# Patient Record
Sex: Female | Born: 1935 | Race: White | Hispanic: No | Smoking: Never smoker
Health system: Southern US, Community
[De-identification: ages and names within clinical notes are randomized; demographics above are authoritative.]

## PROBLEM LIST (undated history)

## (undated) DIAGNOSIS — G20A1 Parkinson's disease without dyskinesia, without mention of fluctuations: Secondary | ICD-10-CM

## (undated) DIAGNOSIS — E785 Hyperlipidemia, unspecified: Secondary | ICD-10-CM

## (undated) DIAGNOSIS — T7840XA Allergy, unspecified, initial encounter: Secondary | ICD-10-CM

## (undated) DIAGNOSIS — H532 Diplopia: Secondary | ICD-10-CM

## (undated) DIAGNOSIS — I10 Essential (primary) hypertension: Secondary | ICD-10-CM

## (undated) DIAGNOSIS — F32A Depression, unspecified: Secondary | ICD-10-CM

## (undated) DIAGNOSIS — F329 Major depressive disorder, single episode, unspecified: Secondary | ICD-10-CM

## (undated) DIAGNOSIS — G459 Transient cerebral ischemic attack, unspecified: Secondary | ICD-10-CM

## (undated) DIAGNOSIS — I5032 Chronic diastolic (congestive) heart failure: Secondary | ICD-10-CM

## (undated) DIAGNOSIS — M1990 Unspecified osteoarthritis, unspecified site: Secondary | ICD-10-CM

## (undated) DIAGNOSIS — J189 Pneumonia, unspecified organism: Secondary | ICD-10-CM

## (undated) DIAGNOSIS — F419 Anxiety disorder, unspecified: Secondary | ICD-10-CM

## (undated) DIAGNOSIS — R296 Repeated falls: Secondary | ICD-10-CM

## (undated) DIAGNOSIS — K219 Gastro-esophageal reflux disease without esophagitis: Secondary | ICD-10-CM

## (undated) DIAGNOSIS — G20 Parkinson's disease: Secondary | ICD-10-CM

## (undated) DIAGNOSIS — K869 Disease of pancreas, unspecified: Secondary | ICD-10-CM

## (undated) DIAGNOSIS — G2581 Restless legs syndrome: Secondary | ICD-10-CM

## (undated) DIAGNOSIS — I2510 Atherosclerotic heart disease of native coronary artery without angina pectoris: Secondary | ICD-10-CM

## (undated) DIAGNOSIS — Z8709 Personal history of other diseases of the respiratory system: Secondary | ICD-10-CM

## (undated) DIAGNOSIS — K859 Acute pancreatitis without necrosis or infection, unspecified: Secondary | ICD-10-CM

## (undated) DIAGNOSIS — E039 Hypothyroidism, unspecified: Secondary | ICD-10-CM

## (undated) DIAGNOSIS — A419 Sepsis, unspecified organism: Secondary | ICD-10-CM

## (undated) HISTORY — DX: Unspecified osteoarthritis, unspecified site: M19.90

## (undated) HISTORY — PX: OTHER SURGICAL HISTORY: SHX169

## (undated) HISTORY — DX: Hypothyroidism, unspecified: E03.9

## (undated) HISTORY — PX: THORACENTESIS: SHX235

## (undated) HISTORY — DX: Gastro-esophageal reflux disease without esophagitis: K21.9

## (undated) HISTORY — DX: Diplopia: H53.2

## (undated) HISTORY — DX: Repeated falls: R29.6

## (undated) HISTORY — DX: Hyperlipidemia, unspecified: E78.5

## (undated) HISTORY — DX: Pneumonia, unspecified organism: J18.9

## (undated) HISTORY — DX: Allergy, unspecified, initial encounter: T78.40XA

## (undated) HISTORY — DX: Atherosclerotic heart disease of native coronary artery without angina pectoris: I25.10

## (undated) HISTORY — DX: Anxiety disorder, unspecified: F41.9

## (undated) HISTORY — DX: Restless legs syndrome: G25.81

## (undated) HISTORY — DX: Disease of pancreas, unspecified: K86.9

## (undated) HISTORY — PX: ESOPHAGOGASTRODUODENOSCOPY: SHX1529

## (undated) HISTORY — PX: BREAST LUMPECTOMY: SHX2

## (undated) HISTORY — DX: Major depressive disorder, single episode, unspecified: F32.9

## (undated) HISTORY — DX: Transient cerebral ischemic attack, unspecified: G45.9

## (undated) HISTORY — DX: Parkinson's disease without dyskinesia, without mention of fluctuations: G20.A1

## (undated) HISTORY — DX: Depression, unspecified: F32.A

## (undated) HISTORY — PX: ABDOMINAL HYSTERECTOMY: SHX81

## (undated) HISTORY — DX: Acute pancreatitis without necrosis or infection, unspecified: K85.90

## (undated) HISTORY — DX: Parkinson's disease: G20

## (undated) HISTORY — DX: Sepsis, unspecified organism: A41.9

## (undated) HISTORY — DX: Essential (primary) hypertension: I10

## (undated) HISTORY — DX: Personal history of other diseases of the respiratory system: Z87.09

---

## 1971-08-23 HISTORY — PX: CHOLECYSTECTOMY: SHX55

## 2002-05-21 LAB — HM MAMMOGRAPHY: HM Mammogram: NORMAL

## 2002-08-22 HISTORY — PX: OTHER SURGICAL HISTORY: SHX169

## 2003-06-23 ENCOUNTER — Other Ambulatory Visit: Payer: Self-pay

## 2003-06-24 ENCOUNTER — Other Ambulatory Visit: Payer: Self-pay

## 2003-08-23 HISTORY — PX: KYPHOPLASTY: SHX5884

## 2003-08-23 HISTORY — PX: OTHER SURGICAL HISTORY: SHX169

## 2003-12-20 ENCOUNTER — Other Ambulatory Visit: Payer: Self-pay

## 2004-05-05 ENCOUNTER — Other Ambulatory Visit: Payer: Self-pay

## 2004-06-09 ENCOUNTER — Emergency Department: Payer: Self-pay | Admitting: Internal Medicine

## 2004-07-05 ENCOUNTER — Inpatient Hospital Stay: Payer: Self-pay

## 2004-08-02 ENCOUNTER — Other Ambulatory Visit: Payer: Self-pay

## 2004-08-02 ENCOUNTER — Inpatient Hospital Stay: Payer: Self-pay | Admitting: Anesthesiology

## 2004-08-15 ENCOUNTER — Other Ambulatory Visit: Payer: Self-pay

## 2004-08-15 ENCOUNTER — Inpatient Hospital Stay: Payer: Self-pay

## 2005-02-09 ENCOUNTER — Ambulatory Visit: Payer: Self-pay | Admitting: Internal Medicine

## 2005-03-10 ENCOUNTER — Ambulatory Visit: Payer: Self-pay | Admitting: Internal Medicine

## 2005-05-11 ENCOUNTER — Ambulatory Visit: Payer: Self-pay | Admitting: Internal Medicine

## 2005-06-23 ENCOUNTER — Ambulatory Visit: Payer: Self-pay | Admitting: Internal Medicine

## 2005-07-22 ENCOUNTER — Ambulatory Visit: Payer: Self-pay | Admitting: Internal Medicine

## 2005-10-26 ENCOUNTER — Ambulatory Visit: Payer: Self-pay | Admitting: Internal Medicine

## 2005-11-10 ENCOUNTER — Ambulatory Visit: Payer: Self-pay | Admitting: Internal Medicine

## 2005-11-10 ENCOUNTER — Encounter: Payer: Self-pay | Admitting: Internal Medicine

## 2005-11-20 ENCOUNTER — Encounter: Payer: Self-pay | Admitting: Internal Medicine

## 2005-12-20 ENCOUNTER — Encounter: Payer: Self-pay | Admitting: Internal Medicine

## 2005-12-28 ENCOUNTER — Ambulatory Visit: Payer: Self-pay | Admitting: Internal Medicine

## 2006-01-02 ENCOUNTER — Ambulatory Visit: Payer: Self-pay | Admitting: Internal Medicine

## 2006-01-20 ENCOUNTER — Encounter: Payer: Self-pay | Admitting: Internal Medicine

## 2006-02-19 ENCOUNTER — Encounter: Payer: Self-pay | Admitting: Internal Medicine

## 2006-03-02 ENCOUNTER — Ambulatory Visit: Payer: Self-pay | Admitting: Internal Medicine

## 2006-03-22 ENCOUNTER — Encounter: Payer: Self-pay | Admitting: Internal Medicine

## 2006-04-22 ENCOUNTER — Encounter: Payer: Self-pay | Admitting: Internal Medicine

## 2006-05-22 ENCOUNTER — Encounter: Payer: Self-pay | Admitting: Internal Medicine

## 2006-05-29 ENCOUNTER — Ambulatory Visit: Payer: Self-pay | Admitting: Internal Medicine

## 2006-06-22 ENCOUNTER — Encounter: Payer: Self-pay | Admitting: Internal Medicine

## 2006-06-28 ENCOUNTER — Other Ambulatory Visit: Payer: Self-pay

## 2006-06-29 ENCOUNTER — Inpatient Hospital Stay: Payer: Self-pay | Admitting: Internal Medicine

## 2006-07-06 ENCOUNTER — Ambulatory Visit: Payer: Self-pay | Admitting: Internal Medicine

## 2006-07-12 ENCOUNTER — Inpatient Hospital Stay: Payer: Self-pay | Admitting: Internal Medicine

## 2006-07-12 ENCOUNTER — Other Ambulatory Visit: Payer: Self-pay

## 2006-07-14 ENCOUNTER — Encounter: Payer: Self-pay | Admitting: Internal Medicine

## 2006-07-26 ENCOUNTER — Ambulatory Visit: Payer: Self-pay | Admitting: Internal Medicine

## 2006-08-31 ENCOUNTER — Encounter: Payer: Self-pay | Admitting: Internal Medicine

## 2006-09-20 ENCOUNTER — Encounter: Payer: Self-pay | Admitting: Dermatology

## 2006-09-22 ENCOUNTER — Encounter: Payer: Self-pay | Admitting: Dermatology

## 2006-10-03 ENCOUNTER — Ambulatory Visit: Payer: Self-pay | Admitting: Internal Medicine

## 2006-10-21 ENCOUNTER — Encounter: Payer: Self-pay | Admitting: Dermatology

## 2006-11-03 ENCOUNTER — Ambulatory Visit: Payer: Self-pay | Admitting: Internal Medicine

## 2006-11-03 LAB — CONVERTED CEMR LAB
ALT: 12 units/L (ref 0–40)
Albumin: 3.6 g/dL (ref 3.5–5.2)
BUN: 16 mg/dL (ref 6–23)
CO2: 34 meq/L — ABNORMAL HIGH (ref 19–32)
Calcium: 9 mg/dL (ref 8.4–10.5)
Chloride: 106 meq/L (ref 96–112)
Cholesterol: 158 mg/dL (ref 0–200)
Creatinine, Ser: 0.9 mg/dL (ref 0.4–1.2)
Direct LDL: 81.2 mg/dL
GFR calc Af Amer: 78 mL/min
GFR calc non Af Amer: 65 mL/min
Glucose, Bld: 158 mg/dL — ABNORMAL HIGH (ref 70–99)
HDL: 39 mg/dL (ref >39.0)
Phosphorus: 3.1 mg/dL (ref 2.3–4.6)
Potassium: 4 meq/L (ref 3.5–5.1)
Sodium: 145 meq/L (ref 135–145)
Total CHOL/HDL Ratio: 4.1
Triglycerides: 259 mg/dL (ref 0–149)
VLDL: 52 mg/dL — ABNORMAL HIGH (ref 0–40)

## 2006-11-22 ENCOUNTER — Encounter: Payer: Self-pay | Admitting: Internal Medicine

## 2006-12-01 ENCOUNTER — Encounter: Payer: Self-pay | Admitting: Internal Medicine

## 2006-12-21 ENCOUNTER — Encounter: Payer: Self-pay | Admitting: Internal Medicine

## 2007-01-29 ENCOUNTER — Ambulatory Visit: Payer: Self-pay | Admitting: Surgery

## 2007-02-15 ENCOUNTER — Encounter: Payer: Self-pay | Admitting: Internal Medicine

## 2007-02-20 ENCOUNTER — Encounter: Payer: Self-pay | Admitting: Internal Medicine

## 2007-03-07 DIAGNOSIS — I10 Essential (primary) hypertension: Secondary | ICD-10-CM

## 2007-03-07 DIAGNOSIS — I5032 Chronic diastolic (congestive) heart failure: Secondary | ICD-10-CM | POA: Insufficient documentation

## 2007-03-07 DIAGNOSIS — F411 Generalized anxiety disorder: Secondary | ICD-10-CM | POA: Insufficient documentation

## 2007-03-07 DIAGNOSIS — E039 Hypothyroidism, unspecified: Secondary | ICD-10-CM | POA: Insufficient documentation

## 2007-03-07 DIAGNOSIS — K219 Gastro-esophageal reflux disease without esophagitis: Secondary | ICD-10-CM | POA: Insufficient documentation

## 2007-03-07 DIAGNOSIS — F3289 Other specified depressive episodes: Secondary | ICD-10-CM | POA: Insufficient documentation

## 2007-03-07 DIAGNOSIS — E119 Type 2 diabetes mellitus without complications: Secondary | ICD-10-CM | POA: Insufficient documentation

## 2007-03-07 DIAGNOSIS — F329 Major depressive disorder, single episode, unspecified: Secondary | ICD-10-CM | POA: Insufficient documentation

## 2007-03-09 ENCOUNTER — Ambulatory Visit: Payer: Self-pay | Admitting: Internal Medicine

## 2007-03-12 LAB — CONVERTED CEMR LAB: Hgb A1c MFr Bld: 6.3 % — ABNORMAL HIGH (ref 4.6–6.1)

## 2007-03-15 ENCOUNTER — Observation Stay: Payer: Self-pay | Admitting: Internal Medicine

## 2007-03-23 ENCOUNTER — Encounter: Payer: Self-pay | Admitting: Internal Medicine

## 2007-04-12 ENCOUNTER — Telehealth (INDEPENDENT_AMBULATORY_CARE_PROVIDER_SITE_OTHER): Payer: Self-pay | Admitting: *Deleted

## 2007-04-23 ENCOUNTER — Encounter: Payer: Self-pay | Admitting: Internal Medicine

## 2007-05-23 ENCOUNTER — Encounter: Payer: Self-pay | Admitting: Internal Medicine

## 2007-05-29 ENCOUNTER — Telehealth (INDEPENDENT_AMBULATORY_CARE_PROVIDER_SITE_OTHER): Payer: Self-pay | Admitting: *Deleted

## 2007-06-08 ENCOUNTER — Telehealth (INDEPENDENT_AMBULATORY_CARE_PROVIDER_SITE_OTHER): Payer: Self-pay | Admitting: *Deleted

## 2007-06-23 ENCOUNTER — Encounter: Payer: Self-pay | Admitting: Internal Medicine

## 2007-07-05 ENCOUNTER — Telehealth: Payer: Self-pay | Admitting: Internal Medicine

## 2007-07-09 ENCOUNTER — Ambulatory Visit: Payer: Self-pay | Admitting: Internal Medicine

## 2007-07-09 DIAGNOSIS — G2581 Restless legs syndrome: Secondary | ICD-10-CM

## 2007-07-10 ENCOUNTER — Telehealth: Payer: Self-pay | Admitting: Internal Medicine

## 2007-07-11 LAB — CONVERTED CEMR LAB
ALT: 9 units/L (ref 0–35)
AST: 15 units/L (ref 0–37)
Albumin: 3.8 g/dL (ref 3.5–5.2)
Alkaline Phosphatase: 111 units/L (ref 39–117)
BUN: 21 mg/dL (ref 6–23)
Basophils Absolute: 0.1 10*3/uL (ref 0.0–0.1)
Basophils Relative: 1 % (ref 0.0–1.0)
Bilirubin, Direct: 0.1 mg/dL (ref 0.0–0.3)
CO2: 31 meq/L (ref 19–32)
Calcium: 9.5 mg/dL (ref 8.4–10.5)
Chloride: 101 meq/L (ref 96–112)
Creatinine, Ser: 0.8 mg/dL (ref 0.4–1.2)
Eosinophils Absolute: 0.1 10*3/uL (ref 0.0–0.6)
Eosinophils Relative: 1.5 % (ref 0.0–5.0)
Free T4: 1.2 ng/dL (ref 0.6–1.6)
GFR calc Af Amer: 90 mL/min
GFR calc non Af Amer: 74 mL/min
Glucose, Bld: 45 mg/dL — CL (ref 70–99)
HCT: 40.3 % (ref 36.0–46.0)
Hemoglobin: 13.8 g/dL (ref 12.0–15.0)
Hgb A1c MFr Bld: 6.6 % — ABNORMAL HIGH (ref 4.6–6.0)
Lymphocytes Relative: 36.7 % (ref 12.0–46.0)
MCHC: 34.2 g/dL (ref 30.0–36.0)
MCV: 89.7 fL (ref 78.0–100.0)
Monocytes Absolute: 0.5 10*3/uL (ref 0.2–0.7)
Monocytes Relative: 6.6 % (ref 3.0–11.0)
Neutro Abs: 4 10*3/uL (ref 1.4–7.7)
Neutrophils Relative %: 54.2 % (ref 43.0–77.0)
Phosphorus: 2.9 mg/dL (ref 2.3–4.6)
Platelets: 118 10*3/uL — ABNORMAL LOW (ref 150–400)
Potassium: 4 meq/L (ref 3.5–5.1)
RBC: 4.49 M/uL (ref 3.87–5.11)
RDW: 12.8 % (ref 11.5–14.6)
Sodium: 143 meq/L (ref 135–145)
TSH: 0.14 microintl units/mL — ABNORMAL LOW (ref 0.35–5.50)
Total Bilirubin: 0.7 mg/dL (ref 0.3–1.2)
Total Protein: 7.5 g/dL (ref 6.0–8.3)
WBC: 7.4 10*3/uL (ref 4.5–10.5)

## 2007-07-23 ENCOUNTER — Encounter: Payer: Self-pay | Admitting: Internal Medicine

## 2007-08-01 ENCOUNTER — Telehealth (INDEPENDENT_AMBULATORY_CARE_PROVIDER_SITE_OTHER): Payer: Self-pay | Admitting: *Deleted

## 2007-08-13 ENCOUNTER — Encounter: Payer: Self-pay | Admitting: Internal Medicine

## 2007-08-21 ENCOUNTER — Telehealth: Payer: Self-pay | Admitting: Family Medicine

## 2007-08-23 ENCOUNTER — Encounter: Payer: Self-pay | Admitting: Internal Medicine

## 2007-09-03 ENCOUNTER — Telehealth (INDEPENDENT_AMBULATORY_CARE_PROVIDER_SITE_OTHER): Payer: Self-pay | Admitting: *Deleted

## 2007-09-10 ENCOUNTER — Ambulatory Visit: Payer: Self-pay | Admitting: Internal Medicine

## 2007-09-23 ENCOUNTER — Encounter: Payer: Self-pay | Admitting: Internal Medicine

## 2007-09-26 ENCOUNTER — Telehealth (INDEPENDENT_AMBULATORY_CARE_PROVIDER_SITE_OTHER): Payer: Self-pay | Admitting: *Deleted

## 2007-10-02 ENCOUNTER — Telehealth (INDEPENDENT_AMBULATORY_CARE_PROVIDER_SITE_OTHER): Payer: Self-pay | Admitting: *Deleted

## 2007-10-15 ENCOUNTER — Telehealth (INDEPENDENT_AMBULATORY_CARE_PROVIDER_SITE_OTHER): Payer: Self-pay | Admitting: *Deleted

## 2007-10-21 ENCOUNTER — Encounter: Payer: Self-pay | Admitting: Internal Medicine

## 2007-11-08 ENCOUNTER — Telehealth (INDEPENDENT_AMBULATORY_CARE_PROVIDER_SITE_OTHER): Payer: Self-pay | Admitting: *Deleted

## 2007-11-12 ENCOUNTER — Ambulatory Visit: Payer: Self-pay | Admitting: Internal Medicine

## 2007-11-21 ENCOUNTER — Encounter: Payer: Self-pay | Admitting: Internal Medicine

## 2007-12-21 ENCOUNTER — Encounter: Payer: Self-pay | Admitting: Internal Medicine

## 2008-01-10 ENCOUNTER — Telehealth (INDEPENDENT_AMBULATORY_CARE_PROVIDER_SITE_OTHER): Payer: Self-pay | Admitting: *Deleted

## 2008-01-21 ENCOUNTER — Encounter: Payer: Self-pay | Admitting: Internal Medicine

## 2008-01-28 ENCOUNTER — Telehealth (INDEPENDENT_AMBULATORY_CARE_PROVIDER_SITE_OTHER): Payer: Self-pay | Admitting: *Deleted

## 2008-02-20 ENCOUNTER — Encounter: Payer: Self-pay | Admitting: Internal Medicine

## 2008-03-04 ENCOUNTER — Encounter: Payer: Self-pay | Admitting: Internal Medicine

## 2008-03-11 ENCOUNTER — Telehealth (INDEPENDENT_AMBULATORY_CARE_PROVIDER_SITE_OTHER): Payer: Self-pay | Admitting: *Deleted

## 2008-03-17 ENCOUNTER — Ambulatory Visit: Payer: Self-pay | Admitting: Internal Medicine

## 2008-03-19 LAB — CONVERTED CEMR LAB
Albumin: 3.5 g/dL (ref 3.5–5.2)
BUN: 14 mg/dL (ref 6–23)
Basophils Absolute: 0 10*3/uL (ref 0.0–0.1)
Basophils Relative: 0 % (ref 0.0–3.0)
Bilirubin Urine: NEGATIVE
CO2: 33 meq/L — ABNORMAL HIGH (ref 19–32)
Calcium: 9.4 mg/dL (ref 8.4–10.5)
Chloride: 104 meq/L (ref 96–112)
Creatinine, Ser: 1 mg/dL (ref 0.4–1.2)
Crystals: NEGATIVE
Eosinophils Absolute: 0.1 10*3/uL (ref 0.0–0.7)
Eosinophils Relative: 2 % (ref 0.0–5.0)
Free T4: 1.1 ng/dL (ref 0.6–1.6)
GFR calc Af Amer: 69 mL/min
GFR calc non Af Amer: 57 mL/min
Glucose, Bld: 115 mg/dL — ABNORMAL HIGH (ref 70–99)
HCT: 38.5 % (ref 36.0–46.0)
Hemoglobin, Urine: NEGATIVE
Hemoglobin: 13 g/dL (ref 12.0–15.0)
Hgb A1c MFr Bld: 6.8 % — ABNORMAL HIGH (ref 4.6–6.0)
Ketones, ur: NEGATIVE mg/dL
Lymphocytes Relative: 31.2 % (ref 12.0–46.0)
MCHC: 33.8 g/dL (ref 30.0–36.0)
MCV: 91.4 fL (ref 78.0–100.0)
Monocytes Absolute: 0.6 10*3/uL (ref 0.1–1.0)
Monocytes Relative: 9.4 % (ref 3.0–12.0)
Mucus, UA: NEGATIVE
Neutro Abs: 3.4 10*3/uL (ref 1.4–7.7)
Neutrophils Relative %: 57.4 % (ref 43.0–77.0)
Nitrite: NEGATIVE
Phosphorus: 2.6 mg/dL (ref 2.3–4.6)
Platelets: 146 10*3/uL — ABNORMAL LOW (ref 150–400)
Potassium: 5 meq/L (ref 3.5–5.1)
RBC / HPF: NONE SEEN
RBC: 4.21 M/uL (ref 3.87–5.11)
RDW: 12.6 % (ref 11.5–14.6)
Sodium: 144 meq/L (ref 135–145)
Specific Gravity, Urine: 1.015 (ref 1.000–1.03)
Squamous Epithelial / HPF: NEGATIVE /lpf
TSH: 0.34 microintl units/mL — ABNORMAL LOW (ref 0.35–5.50)
Total Protein, Urine: NEGATIVE mg/dL
Urine Glucose: NEGATIVE mg/dL
Urobilinogen, UA: 0.2 (ref 0.0–1.0)
WBC: 6 10*3/uL (ref 4.5–10.5)
pH: 5.5 (ref 5.0–8.0)

## 2008-03-20 ENCOUNTER — Telehealth: Payer: Self-pay | Admitting: Internal Medicine

## 2008-03-22 ENCOUNTER — Encounter: Payer: Self-pay | Admitting: Internal Medicine

## 2008-04-22 ENCOUNTER — Encounter: Payer: Self-pay | Admitting: Internal Medicine

## 2008-04-25 ENCOUNTER — Telehealth: Payer: Self-pay | Admitting: Internal Medicine

## 2008-04-30 ENCOUNTER — Encounter: Payer: Self-pay | Admitting: Internal Medicine

## 2008-05-07 ENCOUNTER — Telehealth: Payer: Self-pay | Admitting: Internal Medicine

## 2008-05-08 ENCOUNTER — Encounter: Payer: Self-pay | Admitting: Internal Medicine

## 2008-05-12 ENCOUNTER — Telehealth (INDEPENDENT_AMBULATORY_CARE_PROVIDER_SITE_OTHER): Payer: Self-pay | Admitting: *Deleted

## 2008-05-12 ENCOUNTER — Encounter: Payer: Self-pay | Admitting: Internal Medicine

## 2008-05-22 ENCOUNTER — Encounter: Payer: Self-pay | Admitting: Internal Medicine

## 2008-06-22 ENCOUNTER — Encounter: Payer: Self-pay | Admitting: Internal Medicine

## 2008-06-27 ENCOUNTER — Encounter (INDEPENDENT_AMBULATORY_CARE_PROVIDER_SITE_OTHER): Payer: Self-pay | Admitting: *Deleted

## 2008-07-08 ENCOUNTER — Telehealth: Payer: Self-pay | Admitting: Internal Medicine

## 2008-07-11 ENCOUNTER — Telehealth: Payer: Self-pay | Admitting: Internal Medicine

## 2008-07-11 ENCOUNTER — Telehealth (INDEPENDENT_AMBULATORY_CARE_PROVIDER_SITE_OTHER): Payer: Self-pay | Admitting: *Deleted

## 2008-07-21 ENCOUNTER — Ambulatory Visit: Payer: Self-pay | Admitting: Internal Medicine

## 2008-07-21 DIAGNOSIS — J309 Allergic rhinitis, unspecified: Secondary | ICD-10-CM | POA: Insufficient documentation

## 2008-07-22 ENCOUNTER — Encounter: Payer: Self-pay | Admitting: Internal Medicine

## 2008-07-23 LAB — CONVERTED CEMR LAB
Albumin: 3.5 g/dL (ref 3.5–5.2)
BUN: 18 mg/dL (ref 6–23)
CO2: 33 meq/L — ABNORMAL HIGH (ref 19–32)
Calcium: 9 mg/dL (ref 8.4–10.5)
Chloride: 103 meq/L (ref 96–112)
Creatinine, Ser: 1 mg/dL (ref 0.4–1.2)
GFR calc Af Amer: 69 mL/min
GFR calc non Af Amer: 57 mL/min
Glucose, Bld: 163 mg/dL — ABNORMAL HIGH (ref 70–99)
Hgb A1c MFr Bld: 6.3 % — ABNORMAL HIGH (ref 4.6–6.0)
Phosphorus: 4.1 mg/dL (ref 2.3–4.6)
Potassium: 4.4 meq/L (ref 3.5–5.1)
Sodium: 143 meq/L (ref 135–145)

## 2008-08-22 ENCOUNTER — Encounter: Payer: Self-pay | Admitting: Internal Medicine

## 2008-08-25 ENCOUNTER — Telehealth: Payer: Self-pay | Admitting: Internal Medicine

## 2008-09-15 ENCOUNTER — Telehealth: Payer: Self-pay | Admitting: Internal Medicine

## 2008-09-22 ENCOUNTER — Encounter: Payer: Self-pay | Admitting: Internal Medicine

## 2008-10-07 ENCOUNTER — Telehealth: Payer: Self-pay | Admitting: Internal Medicine

## 2008-10-09 ENCOUNTER — Telehealth: Payer: Self-pay | Admitting: Internal Medicine

## 2008-10-20 ENCOUNTER — Encounter: Payer: Self-pay | Admitting: Internal Medicine

## 2008-10-21 ENCOUNTER — Telehealth: Payer: Self-pay | Admitting: Internal Medicine

## 2008-10-27 ENCOUNTER — Ambulatory Visit: Payer: Self-pay | Admitting: Internal Medicine

## 2008-10-27 DIAGNOSIS — E785 Hyperlipidemia, unspecified: Secondary | ICD-10-CM

## 2008-10-27 DIAGNOSIS — R32 Unspecified urinary incontinence: Secondary | ICD-10-CM

## 2008-10-27 LAB — CONVERTED CEMR LAB
Bilirubin Urine: NEGATIVE
Blood in Urine, dipstick: NEGATIVE
Glucose, Urine, Semiquant: NEGATIVE
Nitrite: NEGATIVE

## 2008-10-30 LAB — CONVERTED CEMR LAB
ALT: 11 units/L (ref 0–35)
AST: 16 units/L (ref 0–37)
Albumin: 3.6 g/dL (ref 3.5–5.2)
Alkaline Phosphatase: 94 units/L (ref 39–117)
BUN: 25 mg/dL — ABNORMAL HIGH (ref 6–23)
Basophils Absolute: 0 10*3/uL (ref 0.0–0.1)
Basophils Relative: 0.3 % (ref 0.0–3.0)
Bilirubin, Direct: 0.1 mg/dL (ref 0.0–0.3)
CO2: 31 meq/L (ref 19–32)
Calcium: 9.8 mg/dL (ref 8.4–10.5)
Chloride: 101 meq/L (ref 96–112)
Cholesterol: 171 mg/dL (ref 0–200)
Creatinine, Ser: 1 mg/dL (ref 0.4–1.2)
Eosinophils Absolute: 0.1 10*3/uL (ref 0.0–0.7)
Eosinophils Relative: 1.9 % (ref 0.0–5.0)
Free T4: 1 ng/dL (ref 0.6–1.6)
GFR calc Af Amer: 69 mL/min
GFR calc non Af Amer: 57 mL/min
Glucose, Bld: 45 mg/dL — CL (ref 70–99)
HCT: 39.7 % (ref 36.0–46.0)
HDL: 41.1 mg/dL (ref >39.0)
Hemoglobin: 13.4 g/dL (ref 12.0–15.0)
Hgb A1c MFr Bld: 6.5 % — ABNORMAL HIGH (ref 4.6–6.0)
LDL Cholesterol: 98 mg/dL (ref 0–99)
Lymphocytes Relative: 28.8 % (ref 12.0–46.0)
MCHC: 33.6 g/dL (ref 30.0–36.0)
MCV: 91.2 fL (ref 78.0–100.0)
Monocytes Absolute: 0.4 10*3/uL (ref 0.1–1.0)
Monocytes Relative: 6.7 % (ref 3.0–12.0)
Neutro Abs: 4 10*3/uL (ref 1.4–7.7)
Neutrophils Relative %: 62.3 % (ref 43.0–77.0)
Phosphorus: 3 mg/dL (ref 2.3–4.6)
Platelets: 111 10*3/uL — ABNORMAL LOW (ref 150–400)
Potassium: 4.7 meq/L (ref 3.5–5.1)
RBC: 4.36 M/uL (ref 3.87–5.11)
RDW: 12.7 % (ref 11.5–14.6)
Sodium: 145 meq/L (ref 135–145)
TSH: 0.33 microintl units/mL — ABNORMAL LOW (ref 0.35–5.50)
Total Bilirubin: 0.8 mg/dL (ref 0.3–1.2)
Total CHOL/HDL Ratio: 4.2
Total Protein: 6.8 g/dL (ref 6.0–8.3)
Triglycerides: 158 mg/dL — ABNORMAL HIGH (ref 0–149)
VLDL: 32 mg/dL (ref 0–40)
WBC: 6.3 10*3/uL (ref 4.5–10.5)

## 2008-10-31 ENCOUNTER — Telehealth: Payer: Self-pay | Admitting: Internal Medicine

## 2008-11-13 ENCOUNTER — Encounter: Payer: Self-pay | Admitting: Internal Medicine

## 2008-11-13 ENCOUNTER — Telehealth: Payer: Self-pay | Admitting: Internal Medicine

## 2008-11-17 ENCOUNTER — Telehealth: Payer: Self-pay | Admitting: Internal Medicine

## 2008-11-20 ENCOUNTER — Encounter: Payer: Self-pay | Admitting: Internal Medicine

## 2008-11-24 ENCOUNTER — Telehealth: Payer: Self-pay | Admitting: Internal Medicine

## 2008-12-05 ENCOUNTER — Encounter: Payer: Self-pay | Admitting: Internal Medicine

## 2008-12-09 ENCOUNTER — Encounter: Payer: Self-pay | Admitting: Internal Medicine

## 2008-12-15 ENCOUNTER — Telehealth: Payer: Self-pay | Admitting: Internal Medicine

## 2008-12-20 ENCOUNTER — Encounter: Payer: Self-pay | Admitting: Internal Medicine

## 2008-12-22 ENCOUNTER — Telehealth: Payer: Self-pay | Admitting: Internal Medicine

## 2008-12-23 ENCOUNTER — Telehealth: Payer: Self-pay | Admitting: Internal Medicine

## 2009-01-20 ENCOUNTER — Encounter: Payer: Self-pay | Admitting: Internal Medicine

## 2009-01-20 ENCOUNTER — Telehealth: Payer: Self-pay | Admitting: Internal Medicine

## 2009-02-19 ENCOUNTER — Encounter: Payer: Self-pay | Admitting: Internal Medicine

## 2009-02-26 ENCOUNTER — Ambulatory Visit: Payer: Self-pay | Admitting: Internal Medicine

## 2009-03-02 LAB — CONVERTED CEMR LAB
Free T4: 1 ng/dL (ref 0.6–1.6)
Hgb A1c MFr Bld: 6.3 % (ref 4.6–6.5)
TSH: 0.8 microintl units/mL (ref 0.35–5.50)

## 2009-03-16 ENCOUNTER — Telehealth: Payer: Self-pay | Admitting: Internal Medicine

## 2009-03-20 ENCOUNTER — Telehealth: Payer: Self-pay | Admitting: Internal Medicine

## 2009-03-25 ENCOUNTER — Ambulatory Visit: Payer: Self-pay | Admitting: Internal Medicine

## 2009-04-14 ENCOUNTER — Telehealth: Payer: Self-pay | Admitting: Internal Medicine

## 2009-04-22 ENCOUNTER — Ambulatory Visit: Payer: Self-pay | Admitting: Internal Medicine

## 2009-05-14 ENCOUNTER — Encounter: Payer: Self-pay | Admitting: Internal Medicine

## 2009-05-18 ENCOUNTER — Telehealth: Payer: Self-pay | Admitting: Internal Medicine

## 2009-05-27 ENCOUNTER — Ambulatory Visit: Payer: Self-pay | Admitting: Internal Medicine

## 2009-06-24 ENCOUNTER — Ambulatory Visit: Payer: Self-pay | Admitting: Internal Medicine

## 2009-06-30 ENCOUNTER — Ambulatory Visit: Payer: Self-pay | Admitting: Internal Medicine

## 2009-07-03 LAB — CONVERTED CEMR LAB
ALT: 7 units/L (ref 0–35)
Alkaline Phosphatase: 83 units/L (ref 39–117)
BUN: 17 mg/dL (ref 6–23)
Basophils Relative: 0.2 % (ref 0.0–3.0)
Bilirubin, Direct: 0 mg/dL (ref 0.0–0.3)
Calcium: 9.1 mg/dL (ref 8.4–10.5)
Cholesterol: 175 mg/dL (ref 0–200)
Creatinine, Ser: 0.9 mg/dL (ref 0.4–1.2)
Eosinophils Relative: 1.6 % (ref 0.0–5.0)
Hgb A1c MFr Bld: 5.9 % (ref 4.6–6.5)
MCV: 96.9 fL (ref 78.0–100.0)
Monocytes Absolute: 0.3 10*3/uL (ref 0.1–1.0)
Monocytes Relative: 6.3 % (ref 3.0–12.0)
Neutrophils Relative %: 60.2 % (ref 43.0–77.0)
Phosphorus: 3.2 mg/dL (ref 2.3–4.6)
Potassium: 5 meq/L (ref 3.5–5.1)
RBC: 4.01 M/uL (ref 3.87–5.11)
Total Bilirubin: 0.7 mg/dL (ref 0.3–1.2)
VLDL: 35.4 mg/dL (ref 0.0–40.0)

## 2009-07-06 ENCOUNTER — Telehealth: Payer: Self-pay | Admitting: Internal Medicine

## 2009-07-08 ENCOUNTER — Encounter: Payer: Self-pay | Admitting: Internal Medicine

## 2009-07-08 LAB — HM DIABETES EYE EXAM

## 2009-07-13 ENCOUNTER — Telehealth: Payer: Self-pay | Admitting: Internal Medicine

## 2009-07-22 ENCOUNTER — Ambulatory Visit: Payer: Self-pay | Admitting: Internal Medicine

## 2009-07-23 ENCOUNTER — Telehealth: Payer: Self-pay | Admitting: Internal Medicine

## 2009-07-30 ENCOUNTER — Ambulatory Visit: Payer: Self-pay | Admitting: Internal Medicine

## 2009-07-30 DIAGNOSIS — D696 Thrombocytopenia, unspecified: Secondary | ICD-10-CM | POA: Insufficient documentation

## 2009-07-31 LAB — CONVERTED CEMR LAB
Basophils Relative: 0.4 % (ref 0.0–3.0)
Eosinophils Relative: 1.7 % (ref 0.0–5.0)
HCT: 40.3 % (ref 36.0–46.0)
Hemoglobin: 13.7 g/dL (ref 12.0–15.0)
Lymphs Abs: 1.7 10*3/uL (ref 0.7–4.0)
MCV: 96.3 fL (ref 78.0–100.0)
Monocytes Absolute: 0.5 10*3/uL (ref 0.1–1.0)
Monocytes Relative: 9.2 % (ref 3.0–12.0)
Neutro Abs: 2.7 10*3/uL (ref 1.4–7.7)
RBC: 4.19 M/uL (ref 3.87–5.11)
WBC: 5 10*3/uL (ref 4.5–10.5)

## 2009-08-10 ENCOUNTER — Ambulatory Visit: Payer: Self-pay | Admitting: Internal Medicine

## 2009-08-24 ENCOUNTER — Telehealth: Payer: Self-pay | Admitting: Family Medicine

## 2009-08-26 ENCOUNTER — Ambulatory Visit: Payer: Self-pay | Admitting: Internal Medicine

## 2009-09-07 ENCOUNTER — Ambulatory Visit: Payer: Self-pay | Admitting: Internal Medicine

## 2009-09-10 ENCOUNTER — Telehealth: Payer: Self-pay | Admitting: Internal Medicine

## 2009-09-21 ENCOUNTER — Telehealth: Payer: Self-pay | Admitting: Internal Medicine

## 2009-09-23 ENCOUNTER — Ambulatory Visit: Payer: Self-pay | Admitting: Internal Medicine

## 2009-09-24 ENCOUNTER — Ambulatory Visit: Payer: Self-pay | Admitting: Internal Medicine

## 2009-10-21 ENCOUNTER — Ambulatory Visit: Payer: Self-pay | Admitting: Internal Medicine

## 2009-10-29 ENCOUNTER — Telehealth: Payer: Self-pay | Admitting: Internal Medicine

## 2009-11-18 ENCOUNTER — Ambulatory Visit: Payer: Self-pay | Admitting: Internal Medicine

## 2009-11-20 ENCOUNTER — Ambulatory Visit: Payer: Self-pay | Admitting: Internal Medicine

## 2009-11-25 ENCOUNTER — Ambulatory Visit: Payer: Self-pay | Admitting: Internal Medicine

## 2009-12-03 ENCOUNTER — Ambulatory Visit: Payer: Self-pay | Admitting: Internal Medicine

## 2009-12-05 LAB — CONVERTED CEMR LAB
ALT: 17 units/L (ref 0–35)
Albumin: 3.9 g/dL (ref 3.5–5.2)
Alkaline Phosphatase: 84 units/L (ref 39–117)
Bilirubin, Direct: 0.1 mg/dL (ref 0.0–0.3)
Calcium: 9.3 mg/dL (ref 8.4–10.5)
Chloride: 107 meq/L (ref 96–112)
Glucose, Bld: 49 mg/dL — CL (ref 70–99)
Potassium: 4.3 meq/L (ref 3.5–5.1)
Sodium: 147 meq/L — ABNORMAL HIGH (ref 135–145)
Total Bilirubin: 0.3 mg/dL (ref 0.3–1.2)
Total Protein: 6.8 g/dL (ref 6.0–8.3)

## 2009-12-09 ENCOUNTER — Ambulatory Visit: Payer: Self-pay | Admitting: Internal Medicine

## 2009-12-16 ENCOUNTER — Telehealth: Payer: Self-pay | Admitting: Internal Medicine

## 2009-12-17 ENCOUNTER — Telehealth: Payer: Self-pay | Admitting: Internal Medicine

## 2009-12-23 ENCOUNTER — Ambulatory Visit: Payer: Self-pay | Admitting: Internal Medicine

## 2010-01-14 ENCOUNTER — Telehealth: Payer: Self-pay | Admitting: Internal Medicine

## 2010-01-20 ENCOUNTER — Ambulatory Visit: Payer: Self-pay | Admitting: Internal Medicine

## 2010-01-28 ENCOUNTER — Telehealth: Payer: Self-pay | Admitting: Internal Medicine

## 2010-02-03 ENCOUNTER — Ambulatory Visit: Payer: Self-pay | Admitting: Internal Medicine

## 2010-02-17 ENCOUNTER — Ambulatory Visit: Payer: Self-pay | Admitting: Internal Medicine

## 2010-02-26 ENCOUNTER — Ambulatory Visit: Payer: Self-pay | Admitting: Internal Medicine

## 2010-03-10 ENCOUNTER — Ambulatory Visit: Payer: Self-pay | Admitting: Internal Medicine

## 2010-03-17 ENCOUNTER — Ambulatory Visit: Payer: Self-pay | Admitting: Internal Medicine

## 2010-03-29 ENCOUNTER — Telehealth: Payer: Self-pay | Admitting: Internal Medicine

## 2010-03-31 ENCOUNTER — Ambulatory Visit: Payer: Self-pay | Admitting: Internal Medicine

## 2010-04-12 ENCOUNTER — Ambulatory Visit: Payer: Self-pay | Admitting: Internal Medicine

## 2010-04-12 ENCOUNTER — Telehealth: Payer: Self-pay | Admitting: Internal Medicine

## 2010-04-12 DIAGNOSIS — S31109A Unspecified open wound of abdominal wall, unspecified quadrant without penetration into peritoneal cavity, initial encounter: Secondary | ICD-10-CM

## 2010-04-14 LAB — CONVERTED CEMR LAB
Albumin: 3.9 g/dL (ref 3.5–5.2)
BUN: 19 mg/dL (ref 6–23)
Basophils Absolute: 0 10*3/uL (ref 0.0–0.1)
Basophils Relative: 0.5 % (ref 0.0–3.0)
Calcium: 9.3 mg/dL (ref 8.4–10.5)
Chloride: 105 meq/L (ref 96–112)
Eosinophils Absolute: 0.1 10*3/uL (ref 0.0–0.7)
Glucose, Bld: 116 mg/dL — ABNORMAL HIGH (ref 70–99)
Hemoglobin: 13.5 g/dL (ref 12.0–15.0)
Lymphocytes Relative: 27.7 % (ref 12.0–46.0)
MCHC: 34.6 g/dL (ref 30.0–36.0)
MCV: 94.6 fL (ref 78.0–100.0)
Monocytes Absolute: 0.4 10*3/uL (ref 0.1–1.0)
Neutro Abs: 3 10*3/uL (ref 1.4–7.7)
Phosphorus: 2.8 mg/dL (ref 2.3–4.6)
Potassium: 4.5 meq/L (ref 3.5–5.1)
RBC: 4.11 M/uL (ref 3.87–5.11)
RDW: 12.9 % (ref 11.5–14.6)

## 2010-04-22 ENCOUNTER — Encounter: Payer: Self-pay | Admitting: Internal Medicine

## 2010-04-28 ENCOUNTER — Ambulatory Visit: Payer: Self-pay | Admitting: Internal Medicine

## 2010-04-30 ENCOUNTER — Telehealth: Payer: Self-pay | Admitting: Internal Medicine

## 2010-04-30 ENCOUNTER — Encounter: Payer: Self-pay | Admitting: Internal Medicine

## 2010-05-03 ENCOUNTER — Telehealth: Payer: Self-pay | Admitting: Internal Medicine

## 2010-05-05 ENCOUNTER — Ambulatory Visit: Payer: Self-pay | Admitting: Internal Medicine

## 2010-05-14 ENCOUNTER — Telehealth: Payer: Self-pay | Admitting: Internal Medicine

## 2010-05-18 ENCOUNTER — Ambulatory Visit: Payer: Self-pay | Admitting: Internal Medicine

## 2010-05-27 ENCOUNTER — Ambulatory Visit: Payer: Self-pay | Admitting: Internal Medicine

## 2010-06-02 ENCOUNTER — Ambulatory Visit: Payer: Self-pay | Admitting: Internal Medicine

## 2010-06-04 ENCOUNTER — Telehealth: Payer: Self-pay | Admitting: Internal Medicine

## 2010-06-09 ENCOUNTER — Ambulatory Visit: Payer: Self-pay | Admitting: Internal Medicine

## 2010-06-16 ENCOUNTER — Ambulatory Visit: Payer: Self-pay | Admitting: Internal Medicine

## 2010-06-24 ENCOUNTER — Ambulatory Visit: Payer: Self-pay | Admitting: Internal Medicine

## 2010-06-28 ENCOUNTER — Telehealth: Payer: Self-pay | Admitting: Internal Medicine

## 2010-06-29 ENCOUNTER — Telehealth: Payer: Self-pay | Admitting: Internal Medicine

## 2010-06-30 ENCOUNTER — Ambulatory Visit: Payer: Self-pay | Admitting: Internal Medicine

## 2010-06-30 ENCOUNTER — Emergency Department: Payer: Self-pay | Admitting: Emergency Medicine

## 2010-07-04 ENCOUNTER — Inpatient Hospital Stay: Payer: Self-pay | Admitting: Internal Medicine

## 2010-07-09 ENCOUNTER — Encounter: Payer: Self-pay | Admitting: Internal Medicine

## 2010-07-13 ENCOUNTER — Encounter: Payer: Self-pay | Admitting: Internal Medicine

## 2010-07-14 ENCOUNTER — Inpatient Hospital Stay: Payer: Self-pay | Admitting: Internal Medicine

## 2010-07-14 ENCOUNTER — Telehealth: Payer: Self-pay | Admitting: Internal Medicine

## 2010-07-14 ENCOUNTER — Ambulatory Visit: Payer: Self-pay | Admitting: Cardiovascular Disease

## 2010-07-17 ENCOUNTER — Encounter: Payer: Self-pay | Admitting: Cardiovascular Disease

## 2010-07-17 ENCOUNTER — Encounter: Payer: Self-pay | Admitting: Internal Medicine

## 2010-07-19 ENCOUNTER — Telehealth: Payer: Self-pay | Admitting: Internal Medicine

## 2010-07-20 ENCOUNTER — Encounter: Payer: Self-pay | Admitting: Internal Medicine

## 2010-07-20 ENCOUNTER — Telehealth: Payer: Self-pay | Admitting: Internal Medicine

## 2010-07-21 ENCOUNTER — Telehealth: Payer: Self-pay | Admitting: Internal Medicine

## 2010-07-21 ENCOUNTER — Ambulatory Visit: Payer: Self-pay | Admitting: Internal Medicine

## 2010-07-21 ENCOUNTER — Encounter: Payer: Self-pay | Admitting: Internal Medicine

## 2010-07-22 ENCOUNTER — Ambulatory Visit: Payer: Self-pay | Admitting: Internal Medicine

## 2010-07-23 ENCOUNTER — Telehealth: Payer: Self-pay | Admitting: Internal Medicine

## 2010-07-23 ENCOUNTER — Encounter: Payer: Self-pay | Admitting: Internal Medicine

## 2010-07-26 ENCOUNTER — Encounter: Payer: Self-pay | Admitting: Internal Medicine

## 2010-07-26 ENCOUNTER — Telehealth: Payer: Self-pay | Admitting: Internal Medicine

## 2010-07-26 LAB — CONVERTED CEMR LAB
Albumin: 3.7 g/dL (ref 3.5–5.2)
Basophils Absolute: 0.1 10*3/uL (ref 0.0–0.1)
Basophils Relative: 0.8 % (ref 0.0–3.0)
Chloride: 101 meq/L (ref 96–112)
Eosinophils Absolute: 0.3 10*3/uL (ref 0.0–0.7)
GFR calc non Af Amer: 60.31 mL/min (ref >60)
Glucose, Bld: 173 mg/dL — ABNORMAL HIGH (ref 70–99)
Lymphocytes Relative: 28.4 % (ref 12.0–46.0)
MCHC: 33.1 g/dL (ref 30.0–36.0)
MCV: 93.7 fL (ref 78.0–100.0)
Monocytes Absolute: 0.5 10*3/uL (ref 0.1–1.0)
Neutrophils Relative %: 58.4 % (ref 43.0–77.0)
Phosphorus: 2.5 mg/dL (ref 2.3–4.6)
Platelets: 113 10*3/uL — ABNORMAL LOW (ref 150.0–400.0)
Potassium: 4.4 meq/L (ref 3.5–5.1)
RDW: 13.6 % (ref 11.5–14.6)
Sodium: 143 meq/L (ref 135–145)

## 2010-07-28 ENCOUNTER — Encounter: Payer: Self-pay | Admitting: Internal Medicine

## 2010-08-04 ENCOUNTER — Ambulatory Visit: Payer: Self-pay | Admitting: Internal Medicine

## 2010-08-06 ENCOUNTER — Encounter: Payer: Self-pay | Admitting: Internal Medicine

## 2010-08-09 ENCOUNTER — Telehealth: Payer: Self-pay | Admitting: Internal Medicine

## 2010-08-10 ENCOUNTER — Telehealth: Payer: Self-pay | Admitting: Internal Medicine

## 2010-08-11 ENCOUNTER — Emergency Department: Payer: Self-pay | Admitting: Emergency Medicine

## 2010-08-11 ENCOUNTER — Encounter: Payer: Self-pay | Admitting: Internal Medicine

## 2010-08-12 ENCOUNTER — Emergency Department: Payer: Self-pay | Admitting: Emergency Medicine

## 2010-08-12 ENCOUNTER — Encounter: Payer: Self-pay | Admitting: Internal Medicine

## 2010-08-16 ENCOUNTER — Encounter: Payer: Self-pay | Admitting: Internal Medicine

## 2010-08-17 ENCOUNTER — Telehealth: Payer: Self-pay | Admitting: Internal Medicine

## 2010-08-19 ENCOUNTER — Telehealth: Payer: Self-pay | Admitting: Internal Medicine

## 2010-08-19 ENCOUNTER — Encounter: Payer: Self-pay | Admitting: Internal Medicine

## 2010-08-20 ENCOUNTER — Telehealth: Payer: Self-pay | Admitting: Internal Medicine

## 2010-08-20 ENCOUNTER — Encounter: Payer: Self-pay | Admitting: Internal Medicine

## 2010-08-21 ENCOUNTER — Encounter: Payer: Self-pay | Admitting: Internal Medicine

## 2010-08-27 ENCOUNTER — Telehealth: Payer: Self-pay | Admitting: Internal Medicine

## 2010-08-31 ENCOUNTER — Encounter: Payer: Self-pay | Admitting: Internal Medicine

## 2010-09-01 ENCOUNTER — Encounter: Payer: Self-pay | Admitting: Internal Medicine

## 2010-09-01 ENCOUNTER — Ambulatory Visit
Admission: RE | Admit: 2010-09-01 | Discharge: 2010-09-01 | Payer: Self-pay | Source: Home / Self Care | Attending: Internal Medicine | Admitting: Internal Medicine

## 2010-09-01 ENCOUNTER — Other Ambulatory Visit: Payer: Self-pay | Admitting: Internal Medicine

## 2010-09-01 DIAGNOSIS — R634 Abnormal weight loss: Secondary | ICD-10-CM | POA: Insufficient documentation

## 2010-09-01 LAB — RENAL FUNCTION PANEL
Albumin: 3.9 g/dL (ref 3.5–5.2)
BUN: 21 mg/dL (ref 6–23)
CO2: 31 mEq/L (ref 19–32)
Calcium: 9.5 mg/dL (ref 8.4–10.5)
Chloride: 102 mEq/L (ref 96–112)
Creatinine, Ser: 1.2 mg/dL (ref 0.4–1.2)
GFR: 45.61 mL/min — ABNORMAL LOW (ref >60.00)
Glucose, Bld: 156 mg/dL — ABNORMAL HIGH (ref 70–99)
Phosphorus: 3 mg/dL (ref 2.3–4.6)
Potassium: 5.5 mEq/L — ABNORMAL HIGH (ref 3.5–5.1)
Sodium: 140 mEq/L (ref 135–145)

## 2010-09-01 LAB — HEMOGLOBIN A1C: Hgb A1c MFr Bld: 8 % — ABNORMAL HIGH (ref 4.6–6.5)

## 2010-09-02 ENCOUNTER — Encounter: Payer: Self-pay | Admitting: Internal Medicine

## 2010-09-02 ENCOUNTER — Telehealth: Payer: Self-pay | Admitting: Internal Medicine

## 2010-09-03 ENCOUNTER — Telehealth: Payer: Self-pay | Admitting: Internal Medicine

## 2010-09-03 ENCOUNTER — Encounter: Payer: Self-pay | Admitting: Internal Medicine

## 2010-09-08 ENCOUNTER — Encounter: Payer: Self-pay | Admitting: Internal Medicine

## 2010-09-09 ENCOUNTER — Telehealth: Payer: Self-pay | Admitting: Internal Medicine

## 2010-09-15 ENCOUNTER — Encounter: Payer: Self-pay | Admitting: Internal Medicine

## 2010-09-16 ENCOUNTER — Ambulatory Visit: Admit: 2010-09-16 | Payer: Self-pay | Admitting: Internal Medicine

## 2010-09-17 ENCOUNTER — Telehealth: Payer: Self-pay | Admitting: Internal Medicine

## 2010-09-17 ENCOUNTER — Encounter: Payer: Self-pay | Admitting: Internal Medicine

## 2010-09-19 LAB — CONVERTED CEMR LAB: Hgb A1c MFr Bld: 7.2 % — ABNORMAL HIGH (ref 4.6–6.0)

## 2010-09-20 ENCOUNTER — Encounter: Payer: Self-pay | Admitting: Internal Medicine

## 2010-09-21 ENCOUNTER — Telehealth: Payer: Self-pay | Admitting: Internal Medicine

## 2010-09-21 NOTE — Progress Notes (Signed)
Summary: ALPRAZOLAM  Phone Note Refill Request Message from:  [facility] on September 21, 2009 9:57 AM  Refills Requested: Medication #1:  ALPRAZOLAM 1 MG TABS Take 1 tablet by mouth three times a day   Last Refilled: 08/21/2009 Form on your desk    Method Requested: Fax to Local Pharmacy Initial call taken by: Mervin Hack CMA Duncan Dull),  September 21, 2009 9:58 AM  Follow-up for Phone Call        okay #90 x 2 Follow-up by: Cindee Salt MD,  September 21, 2009 12:18 PM  Additional Follow-up for Phone Call Additional follow up Details #1::        Rx faxed to pharmacy Additional Follow-up by: DeShannon Smith CMA Duncan Dull),  September 21, 2009 12:19 PM    Prescriptions: ALPRAZOLAM 1 MG TABS (ALPRAZOLAM) Take 1 tablet by mouth three times a day  #90 x 2   Entered by:   Mervin Hack CMA (AAMA)   Authorized by:   Cindee Salt MD   Signed by:   Mervin Hack CMA (AAMA) on 09/21/2009   Method used:   Handwritten   RxID:   [id number]

## 2010-09-21 NOTE — Letter (Signed)
Summary: [facility]-Hematoma, Acute Renal Failure  [facility]-Hematoma, Acute Renal Failure   Imported By: Maryln Gottron 07/16/2010 15:56:29  _____________________________________________________________________  External Attachment:    Type:   Image     Comment:   External Document

## 2010-09-21 NOTE — Assessment & Plan Note (Signed)
Summary: 4 month follow up/rbh   Vital Signs:  Patient profile:   77 year old female Weight:      170 pounds Temp:     98.2 degrees F oral Pulse rate:   72 / minute Pulse rhythm:   regular BP sitting:   140 / 80  (left arm) Cuff size:   large  Vitals Entered By: Mervin Hack CMA Duncan Dull) (April 12, 2010 3:36 PM) CC: 4 month follow-up   History of Present Illness: Doing okay  Still has a lot of weak spells---gets out of breath easy tired after any exertion Can't go to grocery store, etc Just tired going from house to car or with minimal housework No ankle swelling No chest pain No diziness or syncope  Still seeing Dr Delfin Edis for the wound  she has an area that won't heal again Dr Alison Murray wants serum pre-albumin level done (fax [phone])  Has cut insulin 3 units of rapid acting before meals Lantus down to 15 units No hypoglycemia since these changes  No problems with depression of late Has had some nervous spells uses alprazolam three times a day regularly Still has restless legs symptoms -- despite the sinemet  Allergies: 1)  ! Celexa (Citalopram Hydrobromide) 2)  Reglan (Metoclopramide Hcl) 3)  Benadryl Allergy Childrens (Diphenhydramine Hcl) 4)  Paxil (Paroxetine Hcl)  Past History:  Past medical, surgical, family and social histories (including risk factors) reviewed for relevance to current acute and chronic problems.  Past Medical History: Reviewed history from 10/27/2008 and no changes required. Anxiety Congestive heart failure--diastolic Depression Diabetes mellitus, type II GERD Hypertension Hypothyroidism Osteoporosis Pancreatic failure Restless legs syndrome Allergic rhinitis Hyperlipidemia Urinary incontinence  Past Surgical History: Reviewed history from 03/07/2007 and no changes required. Cholecystectomy 1970's Lumpectomy right breast Pancreatitis/ ARF 2004 Pancreatic resection/ open wound- [facility] 11/04 Pneumonia/ Emphysema  05/05 EGD- gastric varices/ splenic vein thrombosis 12/05 Hysteroscopy/ D & C ([facility]) 12/05 Fx. shoulder/ pneumonia 12/05 Kyphoplasty  11/05 DEXA- normal 11/03 Diplopia- ? TIA, Carotid negative 11/07  Family History: Reviewed history from 09/10/2007 and no changes required. Father: Died in his 83's, MI Mother: Died in her 22's MI, DM Siblings:! brother, 4 sisters- one deceased from a fall CAD- strong HTN and DM in family Daughter with adenomatous polyps   Social History: Reviewed history from 03/17/2008 and no changes required. Marital Status: Widowed 1989 Children: 7 Retired from [employer] Never Smoked Alcohol use-no  Review of Systems       some sleep problems--has relatively freq awakening Nocturia x 1 only weight is stable  Physical Exam  General:  alert and normal appearance.   Neck:  supple, no masses, no thyromegaly, no carotid bruits, and no cervical lymphadenopathy.   Lungs:  normal respiratory effort, no intercostal retractions, no accessory muscle use, and normal breath sounds.   Heart:  normal rate, regular rhythm, no murmur, and no gallop.   Pulses:  not palptable Extremities:  no sig edema Psych:  normally interactive, good eye contact, not anxious appearing, and not depressed appearing.    Diabetes Management Exam:    Foot Exam (with socks and/or shoes not present):       Sensory-Pinprick/Light touch:          Left medial foot (L-4): normal          Left dorsal foot (L-5): normal          Left lateral foot (S-1): normal          Right medial  foot (L-4): normal          Right dorsal foot (L-5): normal          Right lateral foot (S-1): normal       Inspection:          Left foot: normal          Right foot: normal       Nails:          Left foot: fungal infection          Right foot: fungal infection   Impression & Recommendations:  Problem # 1:  PANCREATIC DISORDER (FAILURE) (ICD-577.9) Assessment Deteriorated somewhat worse NYHA  class 2 or even 3 now discussed fitness attempts to keep stamina up  Problem # 2:  DIABETES MELLITUS, TYPE II (ICD-250.00) Assessment: Improved  no hypoglcemia on lowered regimen may be able to cut out the regular insulin  Her updated medication list for this problem includes:    Lantus 100 Unit/ml Soln (Insulin glargine) .Marland KitchenMarland KitchenMarland KitchenMarland Kitchen 15  units at bedtime    Novolin R 100 Unit/ml Inj Soln (Insulin regular human) .Marland KitchenMarland KitchenMarland KitchenMarland Kitchen 3 units three times a day depending on fingerstick. none if less than 100    Lisinopril 20 Mg Tabs (Lisinopril) .Marland Kitchen... 1 daily    Aspirin 81 Mg Tabs (Aspirin) .Marland Kitchen... Take 1 by mouth once daily  Labs Reviewed: Creat: 0.9 (12/03/2009)     Last Eye Exam: No diabetic retinopathy.    (07/08/2009) Reviewed HgBA1c results: 5.9 (12/03/2009)  5.9 (06/30/2009)  Orders: TLB-A1C / Hgb A1C (Glycohemoglobin) (83036-A1C)  Problem # 3:  THROMBOCYTOPENIA (ICD-287.5) Assessment: Comment Only  hasn't gone back to hematologist will recheck today  Orders: TLB-CBC Platelet - w/Differential (85025-CBCD) Venipuncture (16109)  Problem # 4:  DEPRESSION (ICD-311) Assessment: Unchanged reasonable control still with some anxiety  Her updated medication list for this problem includes:    Alprazolam 1 Mg Tabs (Alprazolam) .Marland Kitchen... Take 1 tablet by mouth three times a day    Buspirone Hcl 10 Mg Tabs (Buspirone hcl) .Marland Kitchen... 1 two times a day  Problem # 5:  HYPERTENSION (ICD-401.9) Assessment: Unchanged no change needed  Her updated medication list for this problem includes:    Lasix 40 Mg Tabs (Furosemide) .Marland Kitchen... Take one by mouth daily    Lisinopril 20 Mg Tabs (Lisinopril) .Marland Kitchen... 1 daily    Metoprolol Succinate 100 Mg Xr24h-tab (Metoprolol succinate) .Marland Kitchen... Take one by mouth daily  BP today: 140/80 Prior BP: 158/104 (02/26/2010)  Labs Reviewed: K+: 4.3 (12/03/2009) Creat: : 0.9 (12/03/2009)   Chol: 175 (06/30/2009)   HDL: 40.20 (06/30/2009)   LDL: 99 (06/30/2009)   TG: 177.0  (06/30/2009)  Problem # 6:  WOUND DEHISCENCE, ABDOMINAL (UEA-540.98) Assessment: Comment Only  will check pre-albumin  Orders: T- * Misc. Laboratory test [phone]) Specimen Handling (78295)  Problem # 7:  RESTLESS LEG SYNDROME (ICD-333.94) Assessment: Comment Only still with some symptoms despite the sinemet  Complete Medication List: 1)  Alprazolam 1 Mg Tabs (Alprazolam) .... Take 1 tablet by mouth three times a day 2)  Hydrocodone-acetaminophen 5-500 Mg Tabs (Hydrocodone-acetaminophen) .... Take 1 tablet by mouth three times a day as needed 3)  Pravastatin Sodium 20 Mg Tabs (Pravastatin sodium) .... Take 1 tablet by mouth every night 4)  Lantus 100 Unit/ml Soln (Insulin glargine) .Marland Kitchen.. 15  units at bedtime 5)  Novolin R 100 Unit/ml Inj Soln (Insulin regular human) .... 3 units three times a day depending on fingerstick. none if less than 100 6)  Lasix 40 Mg Tabs (Furosemide) .... Take one by mouth daily 7)  Synthroid 75 Mcg Tabs (Levothyroxine sodium) .... Take one tablet daily 8)  Prilosec 20 Mg Cpdr (Omeprazole) .... Take one by mouth daily 9)  Carbidopa-levodopa Cr 25-100 Mg Tbcr (Carbidopa-levodopa) .... Take 1 tablet by mouth three times a day 10)  Lisinopril 20 Mg Tabs (Lisinopril) .Marland Kitchen.. 1 daily 11)  Metoprolol Succinate 100 Mg Xr24h-tab (Metoprolol succinate) .... Take one by mouth daily 12)  Buspirone Hcl 10 Mg Tabs (Buspirone hcl) .Marland Kitchen.. 1 two times a day 13)  Fluticasone Propionate 50 Mcg/act Susp (Fluticasone propionate) .... 2 sprays each nostril daily for allergy symptoms 14)  Zenpep 5000 Unit Cpep (Pancrelipase (lip-prot-amyl)) .... Take 3 capsules by mouth three times a day 15)  Calcium 500 Mg Tabs (Calcium carbonate) .... Take one tablet daily 16)  Aspirin 81 Mg Tabs (Aspirin) .... Take 1 by mouth once daily 17)  Diphenoxylate-atropine 2.5-0.025 Mg Tabs (Diphenoxylate-atropine) .... Take 1 tablet by mouth four times a day as needed diarrhea 18)  Bd Eclipse Syringe 30g X  1/2" 1 Ml Misc (Syringe/needle (disp)) .... As directed 19)  Multivitamins Tabs (Multiple vitamin) .... Take one by mouth daily  Other Orders: TLB-Renal Function Panel (80069-RENAL)  Patient Instructions: 1)  Please schedule a follow-up appointment in 4 months .   Current Allergies (reviewed today): ! CELEXA (CITALOPRAM HYDROBROMIDE) REGLAN (METOCLOPRAMIDE HCL) BENADRYL ALLERGY CHILDRENS (DIPHENHYDRAMINE HCL) PAXIL (PAROXETINE HCL)

## 2010-09-21 NOTE — Progress Notes (Signed)
Summary: cough & feels tight in chest  Phone Note Call from Patient Call back at [phone]   Caller: Patient Call For: [name] Salt MD Summary of Call: Pt has thick drainage at back of throat,  non productive cough , no sorethroat, no fever and no trouble getting breath.  Pt stated feels tight in chest but no chest pain. Pt wondered if med could be called in or does pt need to make an appt. Pt uses [facility] [phone] if pharmacy needed. Please advise.  Initial call taken by: Lewanda Rife LPN,  September 10, 2009 12:45 PM  Follow-up for Phone Call        If no fever or SOB, not clear that antibiotic is needed She should try some tylenol and call back if she worsens for appt tomorrow or at Saturday clinic Follow-up by: [name] Salt MD,  September 10, 2009 1:11 PM  Additional Follow-up for Phone Call Additional follow up Details #1::        Advised pt.  She agreed. Additional Follow-up by: Lowella Petties CMA,  September 10, 2009 4:01 PM

## 2010-09-21 NOTE — Progress Notes (Signed)
Summary: refill request for vicodin  Phone Note Refill Request Message from:  Fax from Pharmacy  Refills Requested: Medication #1:  HYDROCODONE-ACETAMINOPHEN 5-500 MG TABS Take 1 tablet by mouth three times a day as needed   Last Refilled: 09/14/2009 Faxed request from [facility] is on your desk.  Initial call taken by: Lowella Petties CMA,  October 29, 2009 10:09 AM  Follow-up for Phone Call        okay #90 x 1 Follow-up by: Cindee Salt MD,  October 29, 2009 1:41 PM  Additional Follow-up for Phone Call Additional follow up Details #1::        Rx faxed to pharmacy/ [facility] Additional Follow-up by: DeShannon Katrinka Blazing CMA Duncan Dull),  October 29, 2009 2:43 PM    Prescriptions: HYDROCODONE-ACETAMINOPHEN 5-500 MG TABS (HYDROCODONE-ACETAMINOPHEN) Take 1 tablet by mouth three times a day as needed  #90 x 1   Entered by:   Mervin Hack CMA (AAMA)   Authorized by:   Cindee Salt MD   Signed by:   Mervin Hack CMA (AAMA) on 10/29/2009   Method used:   Handwritten   RxID:   [id number]

## 2010-09-21 NOTE — Progress Notes (Signed)
Summary: refill request for alprazolam  Phone Note Refill Request Message from:  Fax from Pharmacy  Refills Requested: Medication #1:  ALPRAZOLAM 1 MG TABS Take 1 tablet by mouth three times a day   Last Refilled: 02/27/2010 Faxed request from [facility] is on your desk.  Initial call taken by: Lowella Petties CMA,  March 29, 2010 3:17 PM  Follow-up for Phone Call        okay #90 x 2 Follow-up by: Cindee Salt MD,  March 29, 2010 5:42 PM  Additional Follow-up for Phone Call Additional follow up Details #1::        Rx called to pharmacy Additional Follow-up by: Sydell Axon LPN,  March 29, 2010 5:45 PM    Prescriptions: ALPRAZOLAM 1 MG TABS (ALPRAZOLAM) Take 1 tablet by mouth three times a day  #90 x 2   Entered by:   Sydell Axon LPN   Authorized by:   Cindee Salt MD   Signed by:   Sydell Axon LPN on 16/05/9603   Method used:   Telephoned to ...       [facility] [address] [phone]* (retail)       [address] [place], [place]  [address]       Ph: [phone]       Fax: [phone]   RxID:   [id number]

## 2010-09-21 NOTE — Assessment & Plan Note (Signed)
Summary: FOLLOW UP / LFW   Vital Signs:  Patient profile:   77 year old female Weight:      176 pounds BMI:     34.50 Temp:     98.3 degrees F oral Pulse rate:   60 / minute Pulse rhythm:   regular BP sitting:   160 / 80  (left arm) Cuff size:   regular  Vitals Entered By: Mervin Hack CMA Duncan Dull) (December 03, 2009 2:32 PM) CC: 4 month follow-up   History of Present Illness: Doing fairly well She does note a lack of energy-- "I just give out easy" Occ has trouble even getting out of bed sleeps okay  Does get lonely by herself spells of depression--not daily Still on buspar and xanax--help her nerves in general  Occ chest pain after eating goes away on its own Allergy problems with eye and nasal symptoms but no sig SOB  Taking the sinemet three times a day---seems to help her shaking and restless legs  Runs sugars low in AM--often <70 and usually under 100 At least one distinct hypoglycemic spell adjusts the novolog before meals no foot problems  Did see Dr Lorre Nick no action taken as yet just following platelet count  Occ breakout spots on abdominal wound using a topical antibiotic now  Allergies: 1)  ! Celexa (Citalopram Hydrobromide) 2)  Reglan (Metoclopramide Hcl) 3)  Benadryl Allergy Childrens (Diphenhydramine Hcl) 4)  Paxil (Paroxetine Hcl)  Past History:  Past medical, surgical, family and social histories (including risk factors) reviewed for relevance to current acute and chronic problems.  Past Medical History: Reviewed history from 10/27/2008 and no changes required. Anxiety Congestive heart failure--diastolic Depression Diabetes mellitus, type II GERD Hypertension Hypothyroidism Osteoporosis Pancreatic failure Restless legs syndrome Allergic rhinitis Hyperlipidemia Urinary incontinence  Past Surgical History: Reviewed history from 03/07/2007 and no changes required. Cholecystectomy 1970's Lumpectomy right breast Pancreatitis/  ARF 2004 Pancreatic resection/ open wound- [facility] 11/04 Pneumonia/ Emphysema 05/05 EGD- gastric varices/ splenic vein thrombosis 12/05 Hysteroscopy/ D & C ([facility]) 12/05 Fx. shoulder/ pneumonia 12/05 Kyphoplasty  11/05 DEXA- normal 11/03 Diplopia- ? TIA, Carotid negative 11/07  Family History: Reviewed history from 09/10/2007 and no changes required. Father: Died in his 67's, MI Mother: Died in her 26's MI, DM Siblings:! brother, 4 sisters- one deceased from a fall CAD- strong HTN and DM in family Daughter with adenomatous polyps   Social History: Reviewed history from 03/17/2008 and no changes required. Marital Status: Widowed 1989 Children: 7 Retired from [employer] Never Smoked Alcohol use-no  Review of Systems       weight up 73--has been less active gets popping and cracking in knees and intermittent back pain. Vicodin does help pain  Physical Exam  General:  alert and normal appearance.   Neck:  supple, no masses, no thyromegaly, no carotid bruits, and no cervical lymphadenopathy.   Lungs:  normal respiratory effort and normal breath sounds.   Heart:  normal rate, regular rhythm, no murmur, and no gallop.   Abdomen:  soft and non-tender.   Pulses:  1+ in feet Extremities:  no sig edema Psych:  normally interactive, good eye contact, not anxious appearing, and dysphoric affect.     Impression & Recommendations:  Problem # 1:  FATIGUE (ICD-780.79) Assessment New non specific Occ achy but more like sleepy and no energy will cut back on lantus no real myalgia (like from statin) ?grieving for brother still?  no Rx now  Problem # 2:  DIABETES  MELLITUS, TYPE II (ICD-250.00) Assessment: Unchanged  tight control will cut back on lantus  Her updated medication list for this problem includes:    Lantus 100 Unit/ml Soln (Insulin glargine) .Marland Kitchen... 20  units at bedtime    Novolin R 100 Unit/ml Inj Soln (Insulin regular human) .Marland KitchenMarland KitchenMarland KitchenMarland Kitchen 4-8 units three times a  day depending on fingerstick. none if less than 100    Lisinopril 20 Mg Tabs (Lisinopril) .Marland Kitchen... 1 daily    Aspirin 81 Mg Tabs (Aspirin) .Marland Kitchen... Take 1 by mouth once daily  Labs Reviewed: Creat: 0.9 (06/30/2009)     Last Eye Exam: No diabetic retinopathy.    (07/08/2009) Reviewed HgBA1c results: 5.9 (06/30/2009)  6.3 (02/26/2009)  Orders: TLB-A1C / Hgb A1C (Glycohemoglobin) (83036-A1C)  Problem # 3:  ANXIETY (ICD-300.00) Assessment: Unchanged ongoing issues but seems controlled no change for now  Her updated medication list for this problem includes:    Alprazolam 1 Mg Tabs (Alprazolam) .Marland Kitchen... Take 1 tablet by mouth three times a day    Buspirone Hcl 10 Mg Tabs (Buspirone hcl) .Marland Kitchen... 1 two times a day  Problem # 4:  DEPRESSION (ICD-311) Assessment: Comment Only likely reason for fatigue but not clear it is med senstiive failed SSRI recently  Her updated medication list for this problem includes:    Alprazolam 1 Mg Tabs (Alprazolam) .Marland Kitchen... Take 1 tablet by mouth three times a day    Buspirone Hcl 10 Mg Tabs (Buspirone hcl) .Marland Kitchen... 1 two times a day  Problem # 5:  HYPOTHYROIDISM (ICD-244.9) Assessment: Unchanged  due for labs  Her updated medication list for this problem includes:    Synthroid 75 Mcg Tabs (Levothyroxine sodium) .Marland Kitchen... Take one tablet daily  Labs Reviewed: TSH: 0.80 (02/26/2009)    HgBA1c: 5.9 (06/30/2009) Chol: 175 (06/30/2009)   HDL: 40.20 (06/30/2009)   LDL: 99 (06/30/2009)   TG: 177.0 (06/30/2009)  Orders: TLB-T4 (Thyrox), Free [phone]) TLB-TSH (Thyroid Stimulating Hormone) (84443-TSH)  Problem # 6:  THROMBOCYTOPENIA (ICD-287.5) Assessment: Comment Only Dr Lorre Nick has seen  Complete Medication List: 1)  Alprazolam 1 Mg Tabs (Alprazolam) .... Take 1 tablet by mouth three times a day 2)  Hydrocodone-acetaminophen 5-500 Mg Tabs (Hydrocodone-acetaminophen) .... Take 1 tablet by mouth three times a day as needed 3)  Pravastatin Sodium 20 Mg Tabs  (Pravastatin sodium) .... Take 1 tablet by mouth every night 4)  Lantus 100 Unit/ml Soln (Insulin glargine) .... 20  units at bedtime 5)  Novolin R 100 Unit/ml Inj Soln (Insulin regular human) .... 4-8 units three times a day depending on fingerstick. none if less than 100 6)  Lasix 40 Mg Tabs (Furosemide) .... Take one by mouth daily 7)  Synthroid 75 Mcg Tabs (Levothyroxine sodium) .... Take one tablet daily 8)  Prilosec 20 Mg Cpdr (Omeprazole) .... Take one by mouth daily 9)  Carbidopa-levodopa Cr 25-100 Mg Tbcr (Carbidopa-levodopa) .... Take 1 tablet by mouth three times a day 10)  Lisinopril 20 Mg Tabs (Lisinopril) .Marland Kitchen.. 1 daily 11)  Metoprolol Succinate 100 Mg Xr24h-tab (Metoprolol succinate) .... Take one by mouth daily 12)  Buspirone Hcl 10 Mg Tabs (Buspirone hcl) .Marland Kitchen.. 1 two times a day 13)  Fluticasone Propionate 50 Mcg/act Susp (Fluticasone propionate) .... 2 sprays each nostril daily for allergy symptoms 14)  Zenpep 5000 Unit Cpep (Pancrelipase (lip-prot-amyl)) .... Take 3 capsules by mouth three times a day 15)  Calcium 500 Mg Tabs (Calcium carbonate) .... Take one tablet daily 16)  Aspirin 81 Mg Tabs (Aspirin) .... Take  1 by mouth once daily 17)  Diphenoxylate-atropine 2.5-0.025 Mg Tabs (Diphenoxylate-atropine) .... Take 1 tablet by mouth four times a day as needed diarrhea 18)  Bd Eclipse Syringe 30g X 1/2" 1 Ml Misc (Syringe/needle (disp)) .... As directed 19)  Multivitamins Tabs (Multiple vitamin) .... Take one by mouth daily  Other Orders: TLB-Renal Function Panel (80069-RENAL) TLB-Hepatic/Liver Function Pnl (80076-HEPATIC) Venipuncture (19147)  Patient Instructions: 1)  Please reduce lantus to 20 units at bedtime 2)  Please schedule a follow-up appointment in 4 months .  3)  Call for earlier appt if you aren't feeling better within the next month or so  Current Allergies (reviewed today): ! CELEXA (CITALOPRAM HYDROBROMIDE) REGLAN (METOCLOPRAMIDE HCL) BENADRYL ALLERGY  CHILDRENS (DIPHENHYDRAMINE HCL) PAXIL (PAROXETINE HCL)

## 2010-09-21 NOTE — Letter (Signed)
Summary: [facility]  [facility]   Imported By: Sherian Rein 05/07/2010 11:51:17  _____________________________________________________________________  External Attachment:    Type:   Image     Comment:   External Document

## 2010-09-21 NOTE — Progress Notes (Signed)
Summary: regarding diabetic supplies  Phone Note From Pharmacy   Caller: [facility] Summary of Call: Received form from diabetic supply.  I checked with pt and she said she didnt want these supplies, companies call her all day long and she is very frustrated with that.  I called [facility] and told them to take her off their call list, I was instructed to write that on the fax they sent and send it back, which I did. Initial call taken by: Lowella Petties CMA,  May 14, 2010 10:15 AM  Follow-up for Phone Call        okay thanks That is why I ask that you check with patients before we do the forms Follow-up by: Cindee Salt MD,  May 14, 2010 1:47 PM

## 2010-09-21 NOTE — Miscellaneous (Signed)
Summary: FLU VACCINE  Clinical Lists Changes  Observations: Added new observation of FLU VAX: Historical (04/22/2010 15:08)      Influenza Immunization History:    Influenza # 1:  Historical (04/22/2010)

## 2010-09-21 NOTE — Progress Notes (Signed)
Summary: wound is not healing  Phone Note Call from Patient Call back at Home Phone [phone]   Caller: Patient Call For: [name] Salt MD Summary of Call: Pt says she was told at the wound center that her wound is not healing because her insulin dose was lowered.  She doesnt want to increase insulin, she says she has not had a low sugar spell since it was changed.  But they did tell her to tell you that her wound is no longer healing. Initial call taken by: Lowella Petties CMA,  June 04, 2010 10:42 AM  Follow-up for Phone Call        Usually wounds will not heal if sugars are very high, but the risks of low sugar reactions may be higher Her overall control has been good with recent A1c 7.2%. I don't think that is the reason why she is not healing  Please fax August labs to wound center Follow-up by: [name] Salt MD,  June 04, 2010 11:42 AM  Additional Follow-up for Phone Call Additional follow up Details #1::        Spoke with patient and advised results. Copy of labs faxed to:  [facility] [address] [place], [place]  [address] Additional Follow-up by: Mervin Hack CMA [facility]),  June 04, 2010 2:54 PM

## 2010-09-21 NOTE — Progress Notes (Signed)
Summary: ok to resume home health care?  Phone Note From Other Clinic   Caller: Stanton Kidney with [facility]  [phone] Summary of Call: Pt was discharged from [facility] on 11/26 and [facility] is asking for verbal order to resume home health care- nursing, physical therapy and home health aide. Initial call taken by: Lowella Petties CMA, AAMA,  July 19, 2010 8:37 AM  Follow-up for Phone Call        okay to resume  Has follow up in 3 days here Cindee Salt MD  July 19, 2010 11:04 AM  Advises Stanton Kidney at [facility].  Follow-up by: Lowella Petties CMA, AAMA,  July 19, 2010 12:45 PM

## 2010-09-21 NOTE — Progress Notes (Signed)
Summary: refill request for vicodin  Phone Note Refill Request Message from:  Fax from Pharmacy  Refills Requested: Medication #1:  HYDROCODONE-ACETAMINOPHEN 5-500 MG TABS Take 1 tablet by mouth three times a day as needed   Last Refilled: 03/29/2010 Faxed request from [facility] is on your desk.  Initial call taken by: Lowella Petties CMA,  May 03, 2010 10:30 AM  Follow-up for Phone Call        okay #90 x 1 Follow-up by: Cindee Salt MD,  May 03, 2010 2:00 PM  Additional Follow-up for Phone Call Additional follow up Details #1::        Rx faxed to pharmacy Additional Follow-up by: DeShannon Smith CMA Duncan Dull),  May 03, 2010 2:20 PM    Prescriptions: HYDROCODONE-ACETAMINOPHEN 5-500 MG TABS (HYDROCODONE-ACETAMINOPHEN) Take 1 tablet by mouth three times a day as needed  #90 x 1   Entered by:   Mervin Hack CMA (AAMA)   Authorized by:   Cindee Salt MD   Signed by:   Mervin Hack CMA (AAMA) on 05/03/2010   Method used:   Handwritten   RxID:   [id number]

## 2010-09-21 NOTE — Procedures (Signed)
Summary: Order for overnight pulse oximetry/IDS  Order for overnight pulse oximetry/IDS   Imported By: Sherian Rein 07/27/2010 12:09:54  _____________________________________________________________________  External Attachment:    Type:   Image     Comment:   External Document

## 2010-09-21 NOTE — Miscellaneous (Signed)
Summary: Order/[facility]  Order/[facility]   Imported By: Lester [place] 07/24/2010 10:40:36  _____________________________________________________________________  External Attachment:    Type:   Image     Comment:   External Document

## 2010-09-21 NOTE — Progress Notes (Signed)
Summary: form for knee brace  Phone Note From Pharmacy   Caller: [facility] Summary of Call: Order form for knee orthosis brace is on your desk.  I spoke with pt and she does want this, thinks it will help her arthritis. Initial call taken by: Lowella Petties CMA,  April 30, 2010 10:18 AM  Follow-up for Phone Call        form signed Follow-up by: Cindee Salt MD,  April 30, 2010 1:39 PM  Additional Follow-up for Phone Call Additional follow up Details #1::        form faxed back to [facility] and scanned Additional Follow-up by: Mervin Hack CMA Duncan Dull),  April 30, 2010 2:30 PM

## 2010-09-21 NOTE — Progress Notes (Signed)
Summary: HYDROCODONE-ACETAMINOPHEN   Phone Note Refill Request Message from:  [facility] [phone] on December 17, 2009 2:16 PM  Refills Requested: Medication #1:  HYDROCODONE-ACETAMINOPHEN 5-500 MG TABS Take 1 tablet by mouth three times a day as needed   Last Refilled: 12/01/2009 e-script is for a different strength, 5-325 which was last refilled on 07/20/2009 at [facility] [address], not sure which strength. I called [facility] who I thought was her pharmacy and she does use the 5-500 and last filled on 12/01/2009. Please advise.   Method Requested: Telephone to Pharmacy Initial call taken by: Mervin Hack CMA Duncan Dull),  December 17, 2009 2:16 PM  Follow-up for Phone Call        check with patient Let her know that she should only filled a controlled substance at one pharmacy---find out which one she wants to use  Was I the prescriber for both? If not, we will have to discuss this in the office very soon. She got 2 months supply on March 10th, so it is too soon for refill yet anyway Follow-up by: Cindee Salt MD,  December 17, 2009 5:48 PM  Additional Follow-up for Phone Call Additional follow up Details #1::        Patient says that she did not have any Rx's fill at [facility].  She only uses [facility].  She refill the medication on 12/01/2009 at [facility], 5/500.  She does not need refills.  Linde Gillis CMA Duncan Dull)  December 18, 2009 12:52 PM   Please call [facility] and make sure they cancel that Rx Additional Follow-up by: Cindee Salt MD,  December 18, 2009 1:20 PM    Additional Follow-up for Phone Call Additional follow up Details #2::    Called [facility] on [address] in [place] and spoke to the pharmacist.  She stated that patient has never had anything filled at any [facility] in the whole country.  Linde Gillis CMA Duncan Dull)  December 18, 2009 2:28 PM   Strange circumstances. Maybe a different Ceonna Sharon Seller She seems to have been appropriate in her requests, etc  Follow-up by:  Cindee Salt MD,  December 18, 2009 2:53 PM

## 2010-09-21 NOTE — Progress Notes (Signed)
Summary: notes from [facility]  Phone Note From Other Clinic   Caller: Misty with [facility] [phone] Summary of Call: Please review faxes Misty sent regarding pt's  med list and oxygen levels.  Pt has appt to see you on thursday.  Notes are on your desk. Initial call taken by: Lowella Petties CMA, AAMA,  July 20, 2010 11:16 AM  Follow-up for Phone Call        many med changes---will review at visit  oxygen levels running low will try to get 24 hour oximetry to determine oxygen need Follow-up by: Cindee Salt MD,  July 20, 2010 1:45 PM

## 2010-09-21 NOTE — Progress Notes (Signed)
Summary: refill request for alprazolam  Phone Note Refill Request Message from:  Fax from Pharmacy  Refills Requested: Medication #1:  ALPRAZOLAM 1 MG TABS Take 1 tablet by mouth three times a day   Last Refilled: 11/20/2009 Faxed form from [facility] is on  your desk.  Initial call taken by: Lowella Petties CMA,  December 16, 2009 11:43 AM  Follow-up for Phone Call        okay #90 x 2 Follow-up by: Cindee Salt MD,  December 16, 2009 1:40 PM  Additional Follow-up for Phone Call Additional follow up Details #1::        Rx faxed to pharmacy Additional Follow-up by: DeShannon Smith CMA Duncan Dull),  December 16, 2009 2:28 PM    Prescriptions: ALPRAZOLAM 1 MG TABS (ALPRAZOLAM) Take 1 tablet by mouth three times a day  #90 x 2   Entered by:   Mervin Hack CMA (AAMA)   Authorized by:   Cindee Salt MD   Signed by:   Mervin Hack CMA (AAMA) on 12/16/2009   Method used:   Handwritten   RxID:   [id number]

## 2010-09-21 NOTE — Assessment & Plan Note (Signed)
Summary: ??sinus infection/alc   Vital Signs:  Patient profile:   76 year old female Weight:      170 pounds Temp:     98 degrees F oral Pulse rate:   60 / minute Pulse rhythm:   regular BP sitting:   158 / 104  (right arm) Cuff size:   large  Vitals Entered By: Lowella Petties CMA (February 26, 2010 4:21 PM) CC: Itchy watery eyes, head doesnt feel right.  No energy.   History of Present Illness: "I feel so bad I can hardly get up"  Head doesn't feel right some periobital swelling noted by daughter appetite is off head feels stuffy feels this goes back at least a month  No cough  no fever no sore throat but has lots of PND Left ear is stopped up  Allergies: 1)  ! Celexa (Citalopram Hydrobromide) 2)  Reglan (Metoclopramide Hcl) 3)  Benadryl Allergy Childrens (Diphenhydramine Hcl) 4)  Paxil (Paroxetine Hcl)  Past History:  Past medical, surgical, family and social histories (including risk factors) reviewed for relevance to current acute and chronic problems.  Past Medical History: Reviewed history from 10/27/2008 and no changes required. Anxiety Congestive heart failure--diastolic Depression Diabetes mellitus, type II GERD Hypertension Hypothyroidism Osteoporosis Pancreatic failure Restless legs syndrome Allergic rhinitis Hyperlipidemia Urinary incontinence  Past Surgical History: Reviewed history from 03/07/2007 and no changes required. 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 shoulder/ pneumonia 12/05 Kyphoplasty  11/05 DEXA- normal 11/03 Diplopia- ? TIA, Carotid negative 11/07  Family History: Reviewed history from 09/10/2007 and no changes required. Father: Died in his 79's, MI Mother: Died in her 74's MI, DM Siblings:! brother, 4 sisters- one  deceased from a fall CAD- strong HTN and DM in family Daughter with adenomatous polyps   Social History: Reviewed history from 03/17/2008 and no changes required. Marital Status: Widowed 1989 Children: 7 Retired from [employer] Never Smoked Alcohol use-no  Review of Systems       appetite poor weight is down 6# Occ nausea when she awakens with mucus in her throat  Physical Exam  General:  alert.  NAD Head:  no maxillary or frontal tenderness Ears:  R ear normal and L ear normal.   Nose:  mild congestion and inflammation Mouth:  no erythema and no exudates.   Neck:  supple, no masses, and no cervical lymphadenopathy.   Lungs:  normal respiratory effort and normal breath sounds.     Impression & Recommendations:  Problem # 1:  SINUSITIS - ACUTE-NOS (ICD-461.9) Assessment New not clear cut but drainage and head congestion are most prominent symptoms will try amoxicillin if not improved, will need to reevaluate  Her updated medication list for this problem includes:    Fluticasone Propionate 50 Mcg/act Susp (Fluticasone propionate) .Marland Kitchen... 2 sprays each nostril daily for allergy symptoms    Amoxicillin 500 Mg Tabs (Amoxicillin) .Marland Kitchen... 2 tabs by mouth two times a day for sinus infection  Complete Medication List: 1)  Alprazolam 1 Mg Tabs (Alprazolam) .... Take 1 tablet by mouth three times a day 2)  Hydrocodone-acetaminophen 5-500 Mg Tabs (Hydrocodone-acetaminophen) .... Take 1 tablet by mouth three times a day as needed 3)  Pravastatin Sodium 20 Mg Tabs (Pravastatin sodium) .... Take 1 tablet by mouth every night 4)  Lantus 100 Unit/ml Soln (Insulin glargine) .... 20  units  at bedtime 5)  Novolin R 100 Unit/ml Inj Soln (Insulin regular human) .... 4-8 units three times a day depending on fingerstick. none if less than 100 6)  Lasix 40 Mg Tabs (Furosemide) .... Take one by mouth daily 7)  Synthroid 75 Mcg Tabs (Levothyroxine sodium) .... Take one tablet daily 8)  Prilosec  20 Mg Cpdr (Omeprazole) .... Take one by mouth daily 9)  Carbidopa-levodopa Cr 25-100 Mg Tbcr (Carbidopa-levodopa) .... Take 1 tablet by mouth three times a day 10)  Lisinopril 20 Mg Tabs (Lisinopril) .Marland Kitchen.. 1 daily 11)  Metoprolol Succinate 100 Mg Xr24h-tab (Metoprolol succinate) .... Take one by mouth daily 12)  Buspirone Hcl 10 Mg Tabs (Buspirone hcl) .Marland Kitchen.. 1 two times a day 13)  Fluticasone Propionate 50 Mcg/act Susp (Fluticasone propionate) .... 2 sprays each nostril daily for allergy symptoms 14)  Zenpep 5000 Unit Cpep (Pancrelipase (lip-prot-amyl)) .... Take 3 capsules by mouth three times a day 15)  Calcium 500 Mg Tabs (Calcium carbonate) .... Take one tablet daily 16)  Aspirin 81 Mg Tabs (Aspirin) .... Take 1 by mouth once daily 17)  Diphenoxylate-atropine 2.5-0.025 Mg Tabs (Diphenoxylate-atropine) .... Take 1 tablet by mouth four times a day as needed diarrhea 18)  Bd Eclipse Syringe 30g X 1/2" 1 Ml Misc (Syringe/needle (disp)) .... As directed 19)  Multivitamins Tabs (Multiple vitamin) .... Take one by mouth daily 20)  Amoxicillin 500 Mg Tabs (Amoxicillin) .... 2 tabs by mouth two times a day for sinus infection  Patient Instructions: 1)  Please call if symptoms aren't better on the antibiotic 2)  Please keep your regular appt Prescriptions: AMOXICILLIN 500 MG TABS (AMOXICILLIN) 2 tabs by mouth two times a day for sinus infection  #40 x 0   Entered and Authorized by:   Cindee Salt MD   Signed by:   Cindee Salt MD on 02/26/2010   Method used:   Electronically to        [facility] [phone]* (retail)       [address] [place], [place]  [address]       Ph: [phone]       Fax: [phone]   RxID:   [id number]   Prior Medications (reviewed today): ALPRAZOLAM 1 MG TABS (ALPRAZOLAM) Take 1 tablet by mouth three times a day HYDROCODONE-ACETAMINOPHEN 5-500 MG TABS (HYDROCODONE-ACETAMINOPHEN) Take 1 tablet by mouth three times a day as  needed PRAVASTATIN SODIUM 20 MG TABS (PRAVASTATIN SODIUM) Take 1 tablet by mouth every night LANTUS 100 UNIT/ML  SOLN (INSULIN GLARGINE) 20  units at bedtime NOVOLIN R 100 UNIT/ML INJ SOLN (INSULIN REGULAR HUMAN) 4-8 units three times a day depending on fingerstick. None if less than 100 LASIX 40 MG  TABS (FUROSEMIDE) Take one by mouth daily SYNTHROID 75 MCG TABS (LEVOTHYROXINE SODIUM) take one tablet daily PRILOSEC 20 MG  CPDR (OMEPRAZOLE) Take one by mouth daily CARBIDOPA-LEVODOPA CR 25-100 MG  TBCR (CARBIDOPA-LEVODOPA) Take 1 tablet by mouth three times a day LISINOPRIL 20 MG  TABS (LISINOPRIL) 1 daily METOPROLOL SUCCINATE 100 MG XR24H-TAB (METOPROLOL SUCCINATE) take one by mouth daily BUSPIRONE HCL 10 MG TABS (BUSPIRONE HCL) 1 two times a day FLUTICASONE PROPIONATE 50 MCG/ACT SUSP (FLUTICASONE PROPIONATE) 2 sprays each nostril daily for allergy symptoms ZENPEP 5000 UNIT CPEP (PANCRELIPASE (LIP-PROT-AMYL)) take 3 capsules by mouth three times a day CALCIUM 500 MG TABS (CALCIUM CARBONATE) take one tablet daily ASPIRIN 81 MG TABS (ASPIRIN) take 1 by mouth  once daily DIPHENOXYLATE-ATROPINE 2.5-0.025 MG TABS (DIPHENOXYLATE-ATROPINE) take 1 tablet by mouth four times a day as needed diarrhea BD ECLIPSE SYRINGE 30G X 1/2" 1 ML MISC (SYRINGE/NEEDLE (DISP)) as directed MULTIVITAMINS   TABS (MULTIPLE VITAMIN) Take one by mouth daily Current Allergies: ! CELEXA (CITALOPRAM HYDROBROMIDE) REGLAN (METOCLOPRAMIDE HCL) BENADRYL ALLERGY CHILDRENS (DIPHENHYDRAMINE HCL) PAXIL (PAROXETINE HCL)

## 2010-09-21 NOTE — Progress Notes (Signed)
Summary: pt is going back to hospital  Phone Note From Other Clinic   Caller: Misty with [facility]  [phone] Summary of Call: Misty called to report that pt was recently discharged from the hospital and today is showing signs of a clot or pneumonia.  The family is taking her back to [facility].  Misty wanted [name] to know, but he is out, so I am sending this to you as well as him. Initial call taken by: Lowella Petties CMA, AAMA,  July 14, 2010 10:45 AM  Follow-up for Phone Call        thank you. Ruthe Mannan MD  July 14, 2010 10:51 AM  back home  has follow up here on thursday  Follow-up by: Cindee Salt MD,  July 19, 2010 5:04 PM

## 2010-09-21 NOTE — Miscellaneous (Signed)
Summary: Physicians Orders/[facility]  Physicians Orders/[facility]   Imported By: Lester [place] 07/24/2010 09:20:33  _____________________________________________________________________  External Attachment:    Type:   Image     Comment:   External Document

## 2010-09-21 NOTE — Miscellaneous (Signed)
Summary: Treatment Plans/[facility]  Treatment Plans/[facility]   Imported By: Sherian Rein 07/27/2010 12:11:33  _____________________________________________________________________  External Attachment:    Type:   Image     Comment:   External Document

## 2010-09-21 NOTE — Progress Notes (Signed)
Summary: Rx Buspirone  Phone Note Refill Request Call back at [phone] Message from:  [facility] on September 21, 2009 10:34 AM  Refills Requested: Medication #1:  BUSPIRONE HCL 10 MG TABS 1 two times a day Received faxed refill request, please advise.  Form in your IN box   Method Requested: Fax to Local Pharmacy Initial call taken by: Linde Gillis CMA Duncan Dull),  September 21, 2009 10:35 AM  Follow-up for Phone Call        Rx completed in Dr. Tiajuana Amass Follow-up by: Cindee Salt MD,  September 21, 2009 10:41 AM    Prescriptions: BUSPIRONE HCL 10 MG TABS (BUSPIRONE HCL) 1 two times a day  #60 x 12   Entered and Authorized by:   Cindee Salt MD   Signed by:   Cindee Salt MD on 09/21/2009   Method used:   Electronically to        [facility] [phone]* (retail)       [address] [place], [place]  [address]       Ph: [phone]       Fax: [phone]   RxID:   [id number]

## 2010-09-21 NOTE — Procedures (Signed)
Summary: Overnight Pulse-Oximetry Test Results,[facility]  Overnight Pulse-Oximetry Test Results,[facility]   Imported By: Beau Fanny 07/29/2010 15:18:18  _____________________________________________________________________  External Attachment:    Type:   Image     Comment:   External Document  Appended Document: Overnight Pulse-Oximetry Test Results,[facility]    Phone Note Outgoing Call   Summary of Call: Please call the oxygen levels on the test did drop briefly about once an hour to a low level This would qualify her to have oxygen Does she think she would wear it regularly if we ordered it?? Initial call taken by: Cindee Salt MD,  July 30, 2010 11:45 AM  Follow-up for Phone Call        spoke with patient and she stated that she can't wear it around during the day, but she could sleep in it. DeShannon Smith CMA Duncan Dull)  July 30, 2010 3:05 PM   Please set up order for O2 concentrator for sleep oximetry at rest as low as 78% Follow-up by: Cindee Salt MD,  July 30, 2010 3:33 PM  New Problems: HYPOXEMIA (ICD-799.02)   New Problems: HYPOXEMIA (ICD-799.02)

## 2010-09-21 NOTE — Progress Notes (Signed)
Summary: form for overnight pulse oximetry  Phone Note From Pharmacy   Caller: [facility] Summary of Call: Form for overnight  pulse oximetry is on your desk. Initial call taken by: Lowella Petties CMA, AAMA,  July 21, 2010 5:00 PM  Follow-up for Phone Call        form done Follow-up by: Cindee Salt MD,  July 22, 2010 1:14 PM  Additional Follow-up for Phone Call Additional follow up Details #1::        FORM FAXED AND SCANNED. Additional Follow-up by: Mervin Hack CMA (AAMA),  July 22, 2010 1:21 PM

## 2010-09-21 NOTE — Progress Notes (Signed)
Summary: refill request for buspirone  Phone Note Refill Request Message from:  Fax from Pharmacy  Refills Requested: Medication #1:  BUSPIRONE HCL 10 MG TABS 1 two times a day   Last Refilled: 07/22/2009 [facility]ed [facility] from [facility].  Initial call taken by: Lowella Petties CMA,  August 24, 2009 9:35 AM    Prescriptions: BUSPIRONE HCL 10 MG TABS (BUSPIRONE HCL) 1 two times a day  #60 x 0   Entered and Authorized by:   Shaune Leeks MD   Signed by:   Shaune Leeks MD on 08/24/2009   Method used:   Electronically to        [facility] [phone]* (retail)       [address] [place], [place]  [address]       Ph: [phone]       Fax: [phone]   RxID:   [id number]

## 2010-09-21 NOTE — Progress Notes (Signed)
Summary: refill request for carbidopa  Phone Note Refill Request Message from:  Fax from Pharmacy  Refills Requested: Medication #1:  CARBIDOPA-LEVODOPA CR 25-100 MG  TBCR Take 1 tablet by mouth three times a day   Last Refilled: 12/16/2009 Faxed request from [name] is on your desk.  Initial call taken by: Lowella Petties CMA,  Jan 14, 2010 9:34 AM  Follow-up for Phone Call        Rx completed in Dr. Tiajuana Amass Follow-up by: Cindee Salt MD,  Jan 14, 2010 1:05 PM    Prescriptions: CARBIDOPA-LEVODOPA CR 25-100 MG  TBCR (CARBIDOPA-LEVODOPA) Take 1 tablet by mouth three times a day  #90 x 12   Entered and Authorized by:   Cindee Salt MD   Signed by:   Cindee Salt MD on 01/14/2010   Method used:   Electronically to        [facility] [phone]* (retail)       [address] [place], [place]  [address]       Ph: [phone]       Fax: [phone]   RxID:   [id number]

## 2010-09-21 NOTE — Progress Notes (Signed)
Summary: refill request for lomotil  Phone Note Refill Request Message from:  Fax from Pharmacy  Refills Requested: Medication #1:  DIPHENOXYLATE-ATROPINE 2.5-0.025 MG TABS take 1 tablet by mouth four times a day as needed diarrhea   Last Refilled: 10/20/2009 Faxed request from [facility] is on your desk.  Initial call taken by: Lowella Petties CMA,  December 17, 2009 8:44 AM  Follow-up for Phone Call        okay #30 x 1 Follow-up by: Cindee Salt MD,  December 17, 2009 1:12 PM  Additional Follow-up for Phone Call Additional follow up Details #1::        Rx faxed to pharmacy Additional Follow-up by: DeShannon Smith CMA Duncan Dull),  December 17, 2009 3:07 PM    Prescriptions: DIPHENOXYLATE-ATROPINE 2.5-0.025 MG TABS (DIPHENOXYLATE-ATROPINE) take 1 tablet by mouth four times a day as needed diarrhea  #30 x 1   Entered by:   Mervin Hack CMA (AAMA)   Authorized by:   Cindee Salt MD   Signed by:   Mervin Hack CMA (AAMA) on 12/17/2009   Method used:   Handwritten   RxID:   [id number]

## 2010-09-21 NOTE — Miscellaneous (Signed)
Summary: Initial Plan of Care Recommendations/[facility]  Initial Plan of Care Recommendations/[facility]   Imported By: Maryln Gottron 07/19/2010 12:30:46  _____________________________________________________________________  External Attachment:    Type:   Image     Comment:   External Document

## 2010-09-21 NOTE — Progress Notes (Signed)
Summary: request for home health aide  Phone Note From Other Clinic   Caller: Stanton Kidney with [facility]  [phone] Summary of Call: Stanton Kidney is asking if [facility] can send out an aide to help pt with bathing and daily activities. Initial call taken by: Lowella Petties CMA, AAMA,  July 23, 2010 9:03 AM  Follow-up for Phone Call        yes Please give them the okay Follow-up by: Cindee Salt MD,  July 23, 2010 11:09 AM  Additional Follow-up for Phone Call Additional follow up Details #1::        Left message on voicemail advising Stanton Kidney as instructed. Additional Follow-up by: Linde Gillis CMA Duncan Dull),  July 23, 2010 11:50 AM

## 2010-09-21 NOTE — Progress Notes (Signed)
Summary: Rx Alprazolam  Phone Note Refill Request Call back at [phone] Message from:  [facility] on June 28, 2010 11:23 AM  Refills Requested: Medication #1:  ALPRAZOLAM 1 MG TABS Take 1 tablet by mouth three times a day   Last Refilled: 06/02/2010 Faxed form is in your in box  Initial call taken by: Sydell Axon LPN,  June 28, 2010 11:23 AM  Follow-up for Phone Call        okay #90 x 2 Follow-up by: Cindee Salt MD,  June 28, 2010 1:41 PM  Additional Follow-up for Phone Call Additional follow up Details #1::        Rx faxed to pharmacy Additional Follow-up by: DeShannon Smith CMA Duncan Dull),  June 28, 2010 2:12 PM    Prescriptions: ALPRAZOLAM 1 MG TABS (ALPRAZOLAM) Take 1 tablet by mouth three times a day  #90 x 2   Entered by:   Mervin Hack CMA (AAMA)   Authorized by:   Cindee Salt MD   Signed by:   Mervin Hack CMA (AAMA) on 06/28/2010   Method used:   Handwritten   RxID:   [id number]

## 2010-09-21 NOTE — Progress Notes (Signed)
Summary: refill request for vicodin  Phone Note Refill Request Message from:  Fax from Pharmacy  Refills Requested: Medication #1:  HYDROCODONE-ACETAMINOPHEN 5-500 MG TABS Take 1 tablet by mouth three times a day as needed   Last Refilled: 12/01/2009 Faxed request from [facility] is on your desk.  Initial call taken by: Lowella Petties CMA,  January 28, 2010 11:50 AM  Follow-up for Phone Call        okay #90 x 1 Follow-up by: Cindee Salt MD,  January 28, 2010 12:55 PM  Additional Follow-up for Phone Call Additional follow up Details #1::        Rx faxed to pharmacy Additional Follow-up by: DeShannon Smith CMA Duncan Dull),  January 28, 2010 1:43 PM    Prescriptions: HYDROCODONE-ACETAMINOPHEN 5-500 MG TABS (HYDROCODONE-ACETAMINOPHEN) Take 1 tablet by mouth three times a day as needed  #90 x 1   Entered by:   Mervin Hack CMA (AAMA)   Authorized by:   Cindee Salt MD   Signed by:   Mervin Hack CMA (AAMA) on 01/28/2010   Method used:   Handwritten   RxID:   [id number]

## 2010-09-21 NOTE — Letter (Signed)
Summary: [facility]   Imported By: Lanelle Bal 07/24/2010 09:05:38  _____________________________________________________________________  External Attachment:    Type:   Image     Comment:   External Document

## 2010-09-21 NOTE — Progress Notes (Signed)
Summary: refill request for lomotil  Phone Note Refill Request Message from:  Fax from Pharmacy  Refills Requested: Medication #1:  DIPHENOXYLATE-ATROPINE 2.5-0.025 MG TABS take 1 tablet by mouth four times a day as needed diarrhea   Last Refilled: 05/17/2010 Faxed request from [facility] is on your desk.  Initial call taken by: Lowella Petties CMA, AAMA,  June 29, 2010 2:38 PM  Follow-up for Phone Call        okay #30 x 2 Follow-up by: Cindee Salt MD,  June 30, 2010 11:55 AM  Additional Follow-up for Phone Call Additional follow up Details #1::        Rx faxed to pharmacy Additional Follow-up by: DeShannon Smith CMA Duncan Dull),  June 30, 2010 2:52 PM    Prescriptions: DIPHENOXYLATE-ATROPINE 2.5-0.025 MG TABS (DIPHENOXYLATE-ATROPINE) take 1 tablet by mouth four times a day as needed diarrhea  #30 x 2   Entered by:   Mervin Hack CMA (AAMA)   Authorized by:   Cindee Salt MD   Signed by:   Mervin Hack CMA (AAMA) on 06/30/2010   Method used:   Handwritten   RxID:   [id number]

## 2010-09-21 NOTE — Assessment & Plan Note (Signed)
Summary: hospital f/u [facility] discharged 07/09/10/dlo   Vital Signs:  Patient profile:   77 year old female Height:      60 inches Weight:      165.50 pounds BMI:     32.44 Temp:     97.9 degrees F oral Pulse rate:   60 / minute Pulse rhythm:   regular BP sitting:   150 / 100  (left arm) Cuff size:   large  Vitals Entered By: Linde Gillis CMA Duncan Dull) (July 22, 2010 2:46 PM) CC: hospital follow up   History of Present Illness: Hospitalized twice [name] and hit left side on detergent top Bad pain so went to ER--sent home Back and then admitted Given morphine but had reaction eventually sent home  then sent back  after 3 days or so Had not been taking lasix and went into heart failure Diuresed and then sent home again  Home again since 5 days ago Had exertional hypoxia----oximetry study planned No chest pain---still sore along left flank where hematoma was back to daily diuretic Breathing is okay at rest---does have some DOE if up more than 10-15 minutes (like in kitchen)  No sig issue with diabetes through all this  Allergies: 1)  ! Celexa (Citalopram Hydrobromide) 2)  Reglan (Metoclopramide Hcl) 3)  Benadryl Allergy Childrens (Diphenhydramine Hcl) 4)  Paxil (Paroxetine Hcl) 5)  Morphine Sulfate (Morphine Sulfate)  Past History:  Past medical, surgical, family and social histories (including risk factors) reviewed for relevance to current acute and chronic problems.  Past Medical History: Reviewed history from 10/27/2008 and no changes required. Anxiety Congestive heart failure--diastolic Depression Diabetes mellitus, type II GERD Hypertension Hypothyroidism Osteoporosis Pancreatic failure Restless legs syndrome Allergic rhinitis Hyperlipidemia Urinary incontinence  Past Surgical History: Reviewed history from 03/07/2007 and no changes required. Cholecystectomy 1970's Lumpectomy right breast Pancreatitis/ ARF 2004 Pancreatic resection/ open wound-  [facility] 11/04 Pneumonia/ Emphysema 05/05 EGD- gastric varices/ splenic vein thrombosis 12/05 Hysteroscopy/ D & C ([facility]) 12/05 Fx. shoulder/ pneumonia 12/05 Kyphoplasty  11/05 DEXA- normal 11/03 Diplopia- ? TIA, Carotid negative 11/07  Family History: Reviewed history from 09/10/2007 and no changes required. Father: Died in his 32's, MI Mother: Died in her 71's MI, DM Siblings:! brother, 4 sisters- one deceased from a fall CAD- strong HTN and DM in family Daughter with adenomatous polyps   Social History: Reviewed history from 03/17/2008 and no changes required. Marital Status: Widowed 1989 Children: 7 Retired from [employer] Never Smoked Alcohol use-no  Review of Systems       weight is down 5# since August Appetite is gone  Physical Exam  General:  alert.  NAD Neck:  supple, no masses, no thyromegaly, and no cervical lymphadenopathy.   Chest Wall:  tenderness along left flank---mid axillary line in low thoracic level Lungs:  normal respiratory effort, no intercostal retractions, no accessory muscle use, normal breath sounds, and no dullness.   Heart:  normal rate, regular rhythm, no murmur, and no gallop.   Extremities:  trace edema on left, none on right Psych:  normally interactive, good eye contact, not anxious appearing, and not depressed appearing.     Impression & Recommendations:  Problem # 1:  CONTUSION OF CHEST WALL (ICD-922.1) Assessment New  hematoma resolved pain better but still no tenderness hospital records reviewed  Orders: TLB-CBC Platelet - w/Differential (85025-CBCD) Venipuncture (82423)  Problem # 2:  FAILURE, DIASTOLIC HEART, CHRONIC (ICD-428.32) Assessment: Comment Only  apparent brief exacerbation from some fluid overload better now will  check renal  Her updated medication list for this problem includes:    Lasix 40 Mg Tabs (Furosemide) .Marland Kitchen... Take one by mouth daily    Lisinopril 20 Mg Tabs (Lisinopril) .Marland Kitchen... 1 daily     Metoprolol Succinate 100 Mg Xr24h-tab (Metoprolol succinate) .Marland Kitchen... Take one by mouth daily    Aspirin 81 Mg Tabs (Aspirin) .Marland Kitchen... Take 1 by mouth once daily  Orders: TLB-Renal Function Panel (80069-RENAL)  Problem # 3:  DIABETES MELLITUS, TYPE II (ICD-250.00) Assessment: Unchanged sugars have geenerally been okay  Her updated medication list for this problem includes:    Lantus 100 Unit/ml Soln (Insulin glargine) .Marland KitchenMarland KitchenMarland KitchenMarland Kitchen 15  units at bedtime    Novolin R 100 Unit/ml Inj Soln (Insulin regular human) .Marland KitchenMarland KitchenMarland KitchenMarland Kitchen 3 units three times a day depending on fingerstick. none if less than 100    Lisinopril 20 Mg Tabs (Lisinopril) .Marland Kitchen... 1 daily    Aspirin 81 Mg Tabs (Aspirin) .Marland Kitchen... Take 1 by mouth once daily  Labs Reviewed: Creat: 1.2 (04/12/2010)     Last Eye Exam: No diabetic retinopathy.    (07/08/2009) Reviewed HgBA1c results: 7.2 (04/12/2010)  5.9 (12/03/2009)  Complete Medication List: 1)  Alprazolam 1 Mg Tabs (Alprazolam) .... Take 1 tablet by mouth three times a day 2)  Hydrocodone-acetaminophen 5-500 Mg Tabs (Hydrocodone-acetaminophen) .... Take 1 tablet by mouth three times a day as needed 3)  Pravastatin Sodium 20 Mg Tabs (Pravastatin sodium) .... Take 1 tablet by mouth every night 4)  Lantus 100 Unit/ml Soln (Insulin glargine) .Marland Kitchen.. 15  units at bedtime 5)  Novolin R 100 Unit/ml Inj Soln (Insulin regular human) .... 3 units three times a day depending on fingerstick. none if less than 100 6)  Lasix 40 Mg Tabs (Furosemide) .... Take one by mouth daily 7)  Synthroid 75 Mcg Tabs (Levothyroxine sodium) .... Take one tablet daily 8)  Prilosec 20 Mg Cpdr (Omeprazole) .... Take one by mouth daily 9)  Carbidopa-levodopa Cr 25-100 Mg Tbcr (Carbidopa-levodopa) .... Take 1 tablet by mouth three times a day 10)  Lisinopril 20 Mg Tabs (Lisinopril) .Marland Kitchen.. 1 daily 11)  Metoprolol Succinate 100 Mg Xr24h-tab (Metoprolol succinate) .... Take one by mouth daily 12)  Buspirone Hcl 10 Mg Tabs (Buspirone hcl)  .Marland Kitchen.. 1 two times a day 13)  Zenpep 5000 Unit Cpep (Pancrelipase (lip-prot-amyl)) .... Take 3 capsules by mouth three times a day 14)  Calcium 500 Mg Tabs (Calcium carbonate) .... Take one tablet daily 15)  Aspirin 81 Mg Tabs (Aspirin) .... Take 1 by mouth once daily 16)  Diphenoxylate-atropine 2.5-0.025 Mg Tabs (Diphenoxylate-atropine) .... Take 1 tablet by mouth four times a day as needed diarrhea 17)  Bd Eclipse Syringe 30g X 1/2" 1 Ml Misc (Syringe/needle (disp)) .... As directed 18)  Oxycodone-acetaminophen 5-325 Mg Tabs (Oxycodone-acetaminophen) .... Take one tablet by mouth every 4 hours as needed 19)  Osteo Bi-flex Regular Strength 250-200 Mg Tabs (Glucosamine-chondroitin) .... Take one tablet by mouth daily 20)  Hydroxyzine Hcl 25 Mg Tabs (Hydroxyzine hcl) .... Take one tablet by mouth every 6 hours 21)  Centrum Silver Tabs (Multiple vitamins-minerals) .... Take one tablet by mouth daily  Patient Instructions: 1)  Please schedule a follow-up appointment in 1-2 months. Please cancel the Dec 22th appt   Orders Added: 1)  TLB-CBC Platelet - w/Differential [85025-CBCD] 2)  Venipuncture [36415] 3)  TLB-Renal Function Panel [80069-RENAL] 4)  Est. Patient Level IV [60109]    Current Allergies (reviewed today): ! CELEXA (CITALOPRAM HYDROBROMIDE) REGLAN (METOCLOPRAMIDE HCL)  BENADRYL ALLERGY CHILDRENS (DIPHENHYDRAMINE HCL) PAXIL (PAROXETINE HCL) MORPHINE SULFATE (MORPHINE SULFATE)   Appended Document: hospital f/u [facility] discharged 07/09/10/dlo     Allergies: 1)  ! Celexa (Citalopram Hydrobromide) 2)  Reglan (Metoclopramide Hcl) 3)  Benadryl Allergy Childrens (Diphenhydramine Hcl) 4)  Paxil (Paroxetine Hcl) 5)  Morphine Sulfate (Morphine Sulfate)   Complete Medication List: 1)  Alprazolam 1 Mg Tabs (Alprazolam) .... Take 1 tablet by mouth three times a day 2)  Hydrocodone-acetaminophen 5-500 Mg Tabs (Hydrocodone-acetaminophen) .... Take 1 tablet by mouth three times a day as  needed 3)  Pravastatin Sodium 20 Mg Tabs (Pravastatin sodium) .... Take 1 tablet by mouth every night 4)  Lantus 100 Unit/ml Soln (Insulin glargine) .Marland Kitchen.. 15  units at bedtime 5)  Novolin R 100 Unit/ml Inj Soln (Insulin regular human) .... 3 units three times a day depending on fingerstick. none if less than 100 6)  Lasix 40 Mg Tabs (Furosemide) .... Take one by mouth daily 7)  Synthroid 75 Mcg Tabs (Levothyroxine sodium) .... Take one tablet daily 8)  Prilosec 20 Mg Cpdr (Omeprazole) .... Take one by mouth daily 9)  Carbidopa-levodopa Cr 25-100 Mg Tbcr (Carbidopa-levodopa) .... Take 1 tablet by mouth three times a day 10)  Lisinopril 20 Mg Tabs (Lisinopril) .Marland Kitchen.. 1 daily 11)  Metoprolol Succinate 100 Mg Xr24h-tab (Metoprolol succinate) .... Take one by mouth daily 12)  Buspirone Hcl 10 Mg Tabs (Buspirone hcl) .Marland Kitchen.. 1 two times a day 13)  Zenpep 5000 Unit Cpep (Pancrelipase (lip-prot-amyl)) .... Take 3 capsules by mouth three times a day 14)  Calcium 500 Mg Tabs (Calcium carbonate) .... Take one tablet daily 15)  Aspirin 81 Mg Tabs (Aspirin) .... Take 1 by mouth once daily 16)  Diphenoxylate-atropine 2.5-0.025 Mg Tabs (Diphenoxylate-atropine) .... Take 1 tablet by mouth four times a day as needed diarrhea 17)  Bd Eclipse Syringe 30g X 1/2" 1 Ml Misc (Syringe/needle (disp)) .... As directed 18)  Oxycodone-acetaminophen 5-325 Mg Tabs (Oxycodone-acetaminophen) .... Take one tablet by mouth every 4 hours as needed 19)  Osteo Bi-flex Regular Strength 250-200 Mg Tabs (Glucosamine-chondroitin) .... Take one tablet by mouth daily 20)  Hydroxyzine Hcl 25 Mg Tabs (Hydroxyzine hcl) .... Take one tablet by mouth every 6 hours 21)  Centrum Silver Tabs (Multiple vitamins-minerals) .... Take one tablet by mouth daily  Other Orders: Pneumococcal Vaccine (60454) Admin 1st Vaccine (09811)   Orders Added: 1)  Pneumococcal Vaccine [90732] 2)  Admin 1st Vaccine [91478]   Immunizations  Administered:  Pneumonia Vaccine:    Vaccine Type: Pneumovax    Site: right deltoid    Mfr: Merck    Dose: 0.5 ml    Route: IM    Given by: Linde Gillis CMA (AAMA)    Exp. Date: 12/17/2011    Lot #: [id number]    VIS given: 07/27/09 version given July 23, 2010.   Immunizations Administered:  Pneumonia Vaccine:    Vaccine Type: Pneumovax    Site: right deltoid    Mfr: Merck    Dose: 0.5 ml    Route: IM    Given by: Linde Gillis CMA (AAMA)    Exp. Date: 12/17/2011    Lot #: [id number]    VIS given: 07/27/09 version given July 23, 2010.

## 2010-09-21 NOTE — Progress Notes (Signed)
Summary: hydrocodone  Phone Note Refill Request Message from:  Fax from Pharmacy on July 26, 2010 11:13 AM  Refills Requested: Medication #1:  HYDROCODONE-ACETAMINOPHEN 5-500 MG TABS Take 1 tablet by mouth three times a day as needed   Last Refilled: 06/28/2010 Refill request from [facility] pharmacy. [phone]. Fax is on your desk.   Initial call taken by: Melody Comas,  July 26, 2010 11:13 AM  Follow-up for Phone Call        okay #90 x 1 Follow-up by: Cindee Salt MD,  July 26, 2010 1:51 PM  Additional Follow-up for Phone Call Additional follow up Details #1::        Rx faxed to pharmacy Additional Follow-up by: DeShannon Smith CMA Duncan Dull),  July 26, 2010 2:18 PM    Prescriptions: HYDROCODONE-ACETAMINOPHEN 5-500 MG TABS (HYDROCODONE-ACETAMINOPHEN) Take 1 tablet by mouth three times a day as needed  #90 x 1   Entered by:   Mervin Hack CMA (AAMA)   Authorized by:   Cindee Salt MD   Signed by:   Mervin Hack CMA (AAMA) on 07/26/2010   Method used:   Handwritten   RxID:   [id number]

## 2010-09-21 NOTE — Letter (Signed)
[facility]   [facility]   Imported By: Roderic Ovens 07/27/2010 16:05:04  _____________________________________________________________________  External Attachment:    Type:   Image     Comment:   External Document

## 2010-09-21 NOTE — Progress Notes (Signed)
Summary: diphenoxyla  Phone Note Refill Request Message from:  Fax from Pharmacy on April 12, 2010 10:25 AM  Refills Requested: Medication #1:  DIPHENOXYLATE-ATROPINE 2.5-0.025 MG TABS take 1 tablet by mouth four times a day as needed diarrhea   Last Refilled: 01/14/2010 Refill request from [facility] pharmacy.  Fax is on your desk.   Initial call taken by: Melody Comas,  April 12, 2010 10:26 AM  Follow-up for Phone Call        okay #30 x 1 Follow-up by: Cindee Salt MD,  April 12, 2010 1:30 PM  Additional Follow-up for Phone Call Additional follow up Details #1::        Rx faxed to pharmacy Additional Follow-up by: DeShannon Smith CMA Duncan Dull),  April 12, 2010 2:29 PM    Prescriptions: DIPHENOXYLATE-ATROPINE 2.5-0.025 MG TABS (DIPHENOXYLATE-ATROPINE) take 1 tablet by mouth four times a day as needed diarrhea  #30 x 1   Entered by:   Mervin Hack CMA (AAMA)   Authorized by:   Cindee Salt MD   Signed by:   Mervin Hack CMA (AAMA) on 04/12/2010   Method used:   Handwritten   RxID:   [id number]

## 2010-09-22 ENCOUNTER — Encounter: Payer: Self-pay | Admitting: Internal Medicine

## 2010-09-23 DIAGNOSIS — T8189XA Other complications of procedures, not elsewhere classified, initial encounter: Secondary | ICD-10-CM

## 2010-09-23 DIAGNOSIS — E119 Type 2 diabetes mellitus without complications: Secondary | ICD-10-CM

## 2010-09-23 DIAGNOSIS — I10 Essential (primary) hypertension: Secondary | ICD-10-CM

## 2010-09-23 NOTE — Progress Notes (Signed)
Summary: asking for marinol  Phone Note From Other Clinic   Caller: Misty with [facility]  [phone] Summary of Call: [name] is asking if pt can have a script for marinol 2.5 mg's two times a day called to [facility].  She has been having some appetite  problems and they gave her this in the hospital.  Also, pt has had some constipation and nurse is asking if ok for pt to take otc stool softener. Initial call taken by: Lowella Petties CMA, AAMA,  August 19, 2010 9:51 AM  Follow-up for Phone Call        Rx needs to be faxed then mailed---it is Schedule 2  Okay to use OTC stool softener (though they are often not very effective) Follow-up by: Cindee Salt MD,  August 19, 2010 12:45 PM  Additional Follow-up for Phone Call Additional follow up Details #1::        left message for [facility], also spoke with pharmacist at [facility], this doesn't need to be mailed it's sch 3. Additional Follow-up by: Mervin Hack CMA Duncan Dull),  August 19, 2010 1:48 PM    New/Updated Medications: MARINOL 2.5 MG CAPS (DRONABINOL) 1 capsule by mouth two times a day  to stimulate appetite Prescriptions: MARINOL 2.5 MG CAPS (DRONABINOL) 1 capsule by mouth two times a day  to stimulate appetite  #60 x 0   Entered and Authorized by:   Cindee Salt MD   Signed by:   Cindee Salt MD on 08/19/2010   Method used:   Print then Give to Patient   RxID:   [id number]

## 2010-09-23 NOTE — Miscellaneous (Signed)
Summary: Order for Potassium Level/[facility]  Order for Potassium Level/[facility]   Imported By: Maryln Gottron 09/10/2010 14:21:16  _____________________________________________________________________  External Attachment:    Type:   Image     Comment:   External Document

## 2010-09-23 NOTE — Progress Notes (Signed)
Summary: needs order for labs  Phone Note Call from Patient   Caller: Daughter in law Dedra Matsuo  [phone] Summary of Call: Pt was told that she needs repeat lab work in a few weeks and [name] is asking if we can send an order to [facility] for home health nurse to draw.  Their fax number is [phone].  Nurse goes to pt's home twice a week. Initial call taken by: Lowella Petties CMA, AAMA,  September 02, 2010 4:37 PM  Follow-up for Phone Call        order written Follow-up by: Cindee Salt MD,  September 03, 2010 7:56 AM  Additional Follow-up for Phone Call Additional follow up Details #1::        order faxed and scanned, spoke with Daughter in law Georgian Co  and advised results Additional Follow-up by: Mervin Hack CMA Duncan Dull),  September 03, 2010 9:52 AM

## 2010-09-23 NOTE — Miscellaneous (Signed)
Summary: Resume Orders/[facility]  Resume Orders/[facility]   Imported By: Lanelle Bal 08/30/2010 07:58:19  _____________________________________________________________________  External Attachment:    Type:   Image     Comment:   External Document

## 2010-09-23 NOTE — Procedures (Signed)
Summary: Overnight Pulse-Oximetry Test Results,[facility]  Overnight Pulse-Oximetry Test Results,[facility]   Imported By: Beau Fanny 09/03/2010 11:06:49  _____________________________________________________________________  External Attachment:    Type:   Image     Comment:   External Document  Appended Document: Overnight Pulse-Oximetry Test Results,[facility] Please call Oxygen levels fine while on the oxygen at night Please continue the oxygen for sleep  Appended Document: Overnight Pulse-Oximetry Test Results,[facility] Spoke with patient and advised results.

## 2010-09-23 NOTE — Medication Information (Signed)
Summary: [facility] / DRONABINOL 2.5MG   [facility] / DRONABINOL 2.5MG    Imported By: Carin Primrose 08/20/2010 15:55:01  _____________________________________________________________________  External Attachment:    Type:   Image     Comment:   External Document

## 2010-09-23 NOTE — Progress Notes (Signed)
  Phone Note Other Incoming   Caller: DR Delfin Edis Summary of Call: Leaving wound clinic Having part time MD coverage for the time being will still have ongoing care there Initial call taken by: Cindee Salt MD,  August 10, 2010 1:35 PM

## 2010-09-23 NOTE — Miscellaneous (Signed)
Summary: Physician's Interim Order/[facility]  Physician's Interim Order/[facility]   Imported By: Maryln Gottron 08/02/2010 14:04:48  _____________________________________________________________________  External Attachment:    Type:   Image     Comment:   External Document

## 2010-09-23 NOTE — Miscellaneous (Signed)
Summary: Oxygen Order/[facility]  Oxygen Order/[facility]   Imported By: Lanelle Bal 08/19/2010 08:50:13  _____________________________________________________________________  External Attachment:    Type:   Image     Comment:   External Document

## 2010-09-23 NOTE — Miscellaneous (Signed)
Summary: PT Orders/[facility]  PT Orders/[facility]   Imported By: Lanelle Bal 08/30/2010 07:59:57  _____________________________________________________________________  External Attachment:    Type:   Image     Comment:   External Document

## 2010-09-23 NOTE — Letter (Signed)
Summary: Incident Report for [facility]  Incident Report for [facility]   Imported By: Lanelle Bal 08/17/2010 15:07:55  _____________________________________________________________________  External Attachment:    Type:   Image     Comment:   External Document

## 2010-09-23 NOTE — Progress Notes (Signed)
Summary: regarding home care  Phone Note From Other Clinic   Caller: Stanton Kidney with [facility] [phone] Summary of Call: Stanton Kidney is asking for verbal order to reinstates pt's home care today. Initial call taken by: Lowella Petties CMA, AAMA,  August 17, 2010 11:40 AM  Follow-up for Phone Call        okay to restart Follow-up by: Cindee Salt MD,  August 17, 2010 1:53 PM  Additional Follow-up for Phone Call Additional follow up Details #1::        Stanton Kidney advised as instructed via telephone. Additional Follow-up by: Linde Gillis CMA Duncan Dull),  August 17, 2010 3:27 PM
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
## 2010-09-23 NOTE — Letter (Signed)
Summary: [facility] Gastro[facility]  [facility]De[facility] G[facility]   Imported By: Maryln Gottron 08/24/2010 15:31:21  _____________________________________________________________________  External Attachment:    Type:   Image     Comment:   External Document

## 2010-09-23 NOTE — Medication Information (Signed)
Summary: [facility],Prior Auth Denial for Dronabinol  [facility],Prior Auth Denial for Dronabinol   Imported By: Beau Fanny 08/30/2010 11:59:19  _____________________________________________________________________  External Attachment:    Type:   Image     Comment:   External Document

## 2010-09-23 NOTE — Letter (Signed)
Summary: [place] [facility],Medical Assistance,Request for Independent Ass  [place] [facility],Medical Assistance,Request for Independent Assessment for PCS   Imported By: Beau Fanny 09/02/2010 11:29:47  _____________________________________________________________________  External Attachment:    Type:   Image     Comment:   External Document

## 2010-09-23 NOTE — Progress Notes (Signed)
Summary: prior auth needed for dronabinol  Phone Note From Pharmacy   Caller: [facility] #[id number]*/ [facility] Summary of Call: Prior Berkley Harvey is needed for dronabinol,  form is on your desk. Initial call taken by: Lowella Petties CMA, AAMA,  August 20, 2010 9:32 AM  Follow-up for Phone Call        form done May not be approved If not, we will need to discuss it at next OV (should be soon) Follow-up by: Cindee Salt MD,  August 20, 2010 1:35 PM  Additional Follow-up for Phone Call Additional follow up Details #1::        form faxed back and scanned Additional Follow-up by: Mervin Hack CMA Duncan Dull),  August 20, 2010 2:38 PM

## 2010-09-23 NOTE — Progress Notes (Signed)
Summary: refill request for hydroxyzine  Phone Note Refill Request Message from:  Fax from Pharmacy  Refills Requested: Medication #1:  HYDROXYZINE HCL 25 MG TABS take one tablet by mouth every 6 hours   Last Refilled: 07/09/2010 Faxed request from [facility] is on your desk.  Initial call taken by: Lowella Petties CMA, AAMA,  September 03, 2010 12:34 PM  Follow-up for Phone Call        Rx completed in Dr. Tiajuana Amass Follow-up by: Cindee Salt MD,  September 03, 2010 1:42 PM    New/Updated Medications: HYDROXYZINE HCL 25 MG TABS (HYDROXYZINE HCL) take one tablet by mouth every 6 hours as needed for itching Prescriptions: HYDROXYZINE HCL 25 MG TABS (HYDROXYZINE HCL) take one tablet by mouth every 6 hours as needed for itching  #60 x 0   Entered and Authorized by:   Cindee Salt MD   Signed by:   Cindee Salt MD on 09/03/2010   Method used:   Electronically to        [facility] [phone]* (retail)       [address] [place], [place]  [address]       Ph: [phone]       Fax: [phone]   RxID:   [id number]

## 2010-09-23 NOTE — Progress Notes (Signed)
Summary: hydrocodone  Phone Note Refill Request Message from:  Fax from Pharmacy on September 17, 2010 10:07 AM  Refills Requested: Medication #1:  HYDROCODONE-ACETAMINOPHEN 5-500 MG TABS Take 1 tablet by mouth up to three times a day as needed   Last Refilled: 08/10/2010 Refill request from [facility]. [phone]. Fax is on your desk.   Initial call taken by: Melody Comas,  September 17, 2010 10:07 AM  Follow-up for Phone Call        okay #90 x 0 Follow-up by: Cindee Salt MD,  September 17, 2010 1:17 PM  Additional Follow-up for Phone Call Additional follow up Details #1::        Rx faxed to pharmacy Additional Follow-up by: DeShannon Smith CMA Duncan Dull),  September 17, 2010 2:21 PM    Prescriptions: HYDROCODONE-ACETAMINOPHEN 5-500 MG TABS (HYDROCODONE-ACETAMINOPHEN) Take 1 tablet by mouth up to three times a day as needed  #90 x 0   Entered by:   Mervin Hack CMA (AAMA)   Authorized by:   Cindee Salt MD   Signed by:   Mervin Hack CMA (AAMA) on 09/17/2010   Method used:   Handwritten   RxID:   [id number]

## 2010-09-23 NOTE — Letter (Signed)
Summary: CMN for Oxygen/[facility]  CMN for Oxygen/[facility]   Imported By: Lanelle Bal 08/24/2010 10:58:20  _____________________________________________________________________  External Attachment:    Type:   Image     Comment:   External Document

## 2010-09-23 NOTE — Assessment & Plan Note (Signed)
Summary: [facility] DISCHARGED 08/16/10/DLO   Vital Signs:  Patient profile:   77 year old female Weight:      156 pounds O2 Sat:      91 % on Room air Temp:     98.5 degrees F oral Pulse rate:   50 / minute Pulse rhythm:   regular BP sitting:   138 / 80  (left arm) Cuff size:   regular  Vitals Entered By: Mervin Hack CMA (AAMA) (September 01, 2010 11:13 AM)  O2 Flow:  Room air CC: hospital follow-up   History of Present Illness: Here with daughter in law Loretta Doutt who I know from hospice  Hospitalized for dehydration and gastroenteritis Still not feeling well Appetite not great ---marinol does seem to be helping some. She had to pay cash for it  No heart problems No chest pain No sig SOB---uses oxygen at night did have repeat overnight oximetry yesterday  Bowels okay Brief constipation after hospital stay--now back to about two times a day  Still somewhat loose Same pancreatic enzyme product--does okay  Monitors sugars regularly Variable from under 120 to 200 No hypoglycemic reactions  Abd wound is about the same still goes to wound center  new rash in past few days Trunk and legs mostly some itching does have new laundry detergent  lisinopril was stopped at hospital  still off   Allergies: 1)  ! Celexa (Citalopram Hydrobromide) 2)  Reglan (Metoclopramide Hcl) 3)  Benadryl Allergy Childrens (Diphenhydramine Hcl) 4)  Paxil (Paroxetine Hcl) 5)  Morphine Sulfate (Morphine Sulfate)  Past History:  Past medical, surgical, family and social histories (including risk factors) reviewed for relevance to current acute and chronic problems.  Past Medical History: Reviewed history from 10/27/2008 and no changes required. Anxiety Congestive heart failure--diastolic Depression Diabetes mellitus, type II GERD Hypertension Hypothyroidism Osteoporosis Pancreatic failure Restless legs syndrome Allergic rhinitis Hyperlipidemia Urinary incontinence  Past  Surgical History: Reviewed history from 03/07/2007 and no changes required. 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 shoulder/ pneumonia 12/05 Kyphoplasty  11/05 DEXA- normal 11/03 Diplopia- ? TIA, Carotid negative 11/07  Family History: Reviewed history from 09/10/2007 and no changes required. Father: Died in his 51's, MI Mother: Died in her 43's MI, DM Siblings:! brother, 4 sisters- one deceased from a fall CAD- strong HTN and DM in family Daughter with adenomatous polyps   Social History: Reviewed history from 03/17/2008 and no changes required. Marital Status: Widowed 1989 Children: 7 Retired from [employer] Never Smoked Alcohol use-no  Review of Systems       sleeps okay NO PND  Physical Exam  General:  alert.  NAD Neck:  supple, no masses, no thyromegaly, no carotid bruits, and no cervical lymphadenopathy.   Lungs:  normal respiratory effort, no intercostal retractions, no accessory muscle use, and normal breath sounds.   Heart:  normal rate, regular rhythm, no murmur, and no gallop.   Extremities:  no edema Skin:  diffuse non specific erythema mostly on anterior thighs Psych:  normally interactive, good eye contact, and subdued.     Impression & Recommendations:  Problem # 1:  WEIGHT LOSS, ABNORMAL (ICD-783.21) Assessment New appetite has been poor for some time seems to have responded to marinol so will continue consider change to mirtazapine if needs ongoing Rx  Problem # 2:  RASH AND OTHER NONSPECIFIC SKIN ERUPTION (  ICD-782.1) Assessment: New looks like it could be from the new laundry detergent will stop this use OTC cortisone cream  Problem # 3:  DIABETES MELLITUS, TYPE II (ICD-250.00) Assessment: Unchanged  mild elevation  no changes for  now  The following medications were removed from the medication list:    Lisinopril 20 Mg Tabs (Lisinopril) .Marland Kitchen... 1 daily Her updated medication list for this problem includes:    Lantus 100 Unit/ml Soln (Insulin glargine) .Marland KitchenMarland KitchenMarland KitchenMarland Kitchen 15  units at bedtime    Novolin R 100 Unit/ml Inj Soln (Insulin regular human) .Marland KitchenMarland KitchenMarland KitchenMarland Kitchen 3 units three times a day depending on fingerstick. none if less than 100    Aspirin 81 Mg Tabs (Aspirin) .Marland Kitchen... Take 1 by mouth once daily  Labs Reviewed: Creat: 1.0 (07/22/2010)     Last Eye Exam: No diabetic retinopathy.    (07/08/2009) Reviewed HgBA1c results: 7.2 (04/12/2010)  5.9 (12/03/2009)  Orders: TLB-A1C / Hgb A1C (Glycohemoglobin) (83036-A1C)  Problem # 4:  FAILURE, DIASTOLIC HEART, CHRONIC (ICD-428.32) Assessment: Unchanged  seems to be compensated will hold off on the ACEI for now  The following medications were removed from the medication list:    Lasix 40 Mg Tabs (Furosemide) .Marland Kitchen... Take one by mouth daily    Lisinopril 20 Mg Tabs (Lisinopril) .Marland Kitchen... 1 daily Her updated medication list for this problem includes:    Metoprolol Succinate 100 Mg Xr24h-tab (Metoprolol succinate) .Marland Kitchen... Take one by mouth daily    Aspirin 81 Mg Tabs (Aspirin) .Marland Kitchen... Take 1 by mouth once daily    Furosemide 20 Mg Tabs (Furosemide) .Marland Kitchen... Take 1 by mouth once daily  Orders: TLB-Renal Function Panel (80069-RENAL) Venipuncture ([address])  Complete Medication List: 1)  Alprazolam 1 Mg Tabs (Alprazolam) .... Take 1 tablet by mouth three times a day 2)  Hydrocodone-acetaminophen 5-500 Mg Tabs (Hydrocodone-acetaminophen) .... Take 1 tablet by mouth up to three times a day as needed 3)  Pravastatin Sodium 20 Mg Tabs (Pravastatin sodium) .... Take 1 tablet by mouth every night 4)  Lantus 100 Unit/ml Soln (Insulin glargine) .Marland Kitchen.. 15  units at bedtime 5)  Novolin R 100 Unit/ml Inj Soln (Insulin regular human) .... 3 units three times a day depending on fingerstick. none if less than 100 6)   Synthroid 75 Mcg Tabs (Levothyroxine sodium) .... Take one tablet daily 7)  Prilosec 20 Mg Cpdr (Omeprazole) .... Take one by mouth daily 8)  Carbidopa-levodopa Cr 25-100 Mg Tbcr (Carbidopa-levodopa) .... Take 1 tablet by mouth three times a day 9)  Metoprolol Succinate 100 Mg Xr24h-tab (Metoprolol succinate) .... Take one by mouth daily 10)  Buspirone Hcl 10 Mg Tabs (Buspirone hcl) .Marland Kitchen.. 1 two times a day 11)  Zenpep 5000 Unit Cpep (Pancrelipase (lip-prot-amyl)) .... Take 3 capsules by mouth three times a day 12)  Calcium 500 Mg Tabs (Calcium carbonate) .... Two times a day 13)  Aspirin 81 Mg Tabs (Aspirin) .... Take 1 by mouth once daily 14)  Diphenoxylate-atropine 2.5-0.025 Mg Tabs (Diphenoxylate-atropine) .... Take 1 tablet by mouth four times a day as needed diarrhea 15)  Bd Eclipse Syringe 30g X 1/2" 1 Ml Misc (Syringe/needle (disp)) .... As directed 16)  Osteo Bi-flex Regular Strength 250-200 Mg Tabs (Glucosamine-chondroitin) .... Take one tablet by mouth daily 17)  Hydroxyzine Hcl 25 Mg Tabs (Hydroxyzine hcl) .... Take one tablet by mouth every 6 hours 18)  Centrum Silver Tabs (Multiple vitamins-minerals) .... Take one tablet by mouth daily 19)  Marinol 2.5 Mg Caps (Dronabinol) .Marland Kitchen.. 1 capsule by  mouth two times a day  to stimulate appetite 20)  Furosemide 20 Mg Tabs (Furosemide) .... Take 1 by mouth once daily  Patient Instructions: 1)  Please schedule a follow-up appointment in 2 months.    Orders Added: 1)  Est. Patient Level IV [[address]] 2)  TLB-Renal Function Panel [80069-RENAL] 3)  Venipuncture [36415] 4)  TLB-A1C / Hgb A1C (Glycohemoglobin) [83036-A1C]    Current Allergies (reviewed today): ! CELEXA (CITALOPRAM HYDROBROMIDE) REGLAN (METOCLOPRAMIDE HCL) BENADRYL ALLERGY CHILDRENS (DIPHENHYDRAMINE HCL) PAXIL (PAROXETINE HCL) MORPHINE SULFATE (MORPHINE SULFATE)

## 2010-09-23 NOTE — Progress Notes (Signed)
Summary: prior auth for dronabinol denied  Phone Note Other Incoming   Caller: [employer] [facility] Summary of Call: Prior auth for dronabinol was denied by insurance, letter is on your desk.                    Lowella Petties CMA, AAMA  August 27, 2010 4:04 PM   Follow-up for Phone Call        will need to discuss at her next follow up Please scan the letter for our records Follow-up by: Cindee Salt MD,  August 27, 2010 5:28 PM  Additional Follow-up for Phone Call Additional follow up Details #1::        spoke with patient, she has appt tomorrow and will talk with you about that tomorrow, letter sent for scanning here at [facility]. Additional Follow-up by: Mervin Hack CMA Duncan Dull),  August 30, 2010 8:51 AM

## 2010-09-23 NOTE — Miscellaneous (Signed)
Summary: Order/[facility]  Order/[facility]   Imported By: Lester [place] 09/14/2010 09:46:43  _____________________________________________________________________  External Attachment:    Type:   Image     Comment:   External Document

## 2010-09-23 NOTE — Progress Notes (Signed)
Summary: refill request for zofran  Phone Note From Other Clinic   Caller: Misty with [facility]  [phone] Summary of Call: Pt is requesting a script for zofran to be called to [facility]. She got this previously from Dr Haskell Flirt at [facility] when she was there.  She will be following up with GI at [facility] next friday.  Faxed form from [facility] is on your desk. Initial call taken by: Lowella Petties CMA, AAMA,  September 09, 2010 10:36 AM  Follow-up for Phone Call        okay #10 x 0 Follow-up by: Cindee Salt MD,  September 09, 2010 12:49 PM  Additional Follow-up for Phone Call Additional follow up Details #1::        rx faxed to pharmacy Additional Follow-up by: DeShannon Katrinka Blazing CMA Duncan Dull),  September 09, 2010 3:24 PM    New/Updated Medications: ONDANSETRON HCL 4 MG TABS (ONDANSETRON HCL) take 1 by mouth every 8 hours as needed for nausea Prescriptions: ONDANSETRON HCL 4 MG TABS (ONDANSETRON HCL) take 1 by mouth every 8 hours as needed for nausea  #10 x 0   Entered by:   Mervin Hack CMA (AAMA)   Authorized by:   Cindee Salt MD   Signed by:   Mervin Hack CMA (AAMA) on 09/09/2010   Method used:   Electronically to        [facility] [phone]* (retail)       [address] [place], [place]  [address]       Ph: [phone]       Fax: [phone]   RxID:   [id number]

## 2010-09-23 NOTE — Miscellaneous (Signed)
Summary: PT Orders/[facility]  PT Orders/[facility]   Imported By: Lanelle Bal 09/09/2010 08:40:05  _____________________________________________________________________  External Attachment:    Type:   Image     Comment:   External Document

## 2010-09-29 NOTE — Miscellaneous (Signed)
Summary: Medication orders/[facility]  Medication orders/[facility]   Imported By: Sherian Rein 09/22/2010 09:11:42  _____________________________________________________________________  External Attachment:    Type:   Image     Comment:   External Document

## 2010-09-29 NOTE — Progress Notes (Signed)
Summary: refill request for marinol  Phone Note From Other Clinic   Caller: Misty with [facility], [phone] Summary of Call: Pt needs another script for marinol, her GI appt has been moved back to 10/08/10, and pt will run out of marinol before then.  Please call Misty when ready.                   Lowella Petties CMA, AAMA  September 21, 2010 11:09 AM   Follow-up for Phone Call        Rx written Follow-up by: Cindee Salt MD,  September 21, 2010 12:53 PM  Additional Follow-up for Phone Call Additional follow up Details #1::        left message on machine for [facility] with [facility],  that pt's rx is ready for pick-up  Additional Follow-up by: Mervin Hack CMA Duncan Dull),  September 21, 2010 2:46 PM    Prescriptions: MARINOL 2.5 MG CAPS (DRONABINOL) 1 capsule by mouth two times a day  to stimulate appetite  #60 x 0   Entered and Authorized by:   Cindee Salt MD   Signed by:   Cindee Salt MD on 09/21/2010   Method used:   Print then Give to Patient   RxID:   [id number]

## 2010-10-04 ENCOUNTER — Telehealth: Payer: Self-pay | Admitting: Internal Medicine

## 2010-10-07 NOTE — Miscellaneous (Signed)
Summary: Hospice Plan of Treatment  Hospice Plan of Treatment   Imported By: Kassie Mends 09/29/2010 10:24:35  _____________________________________________________________________  External Attachment:    Type:   Image     Comment:   External Document

## 2010-10-07 NOTE — Miscellaneous (Signed)
Summary: Hospice  Hospice   Imported By: Kassie Mends 09/27/2010 10:50:08  _____________________________________________________________________  External Attachment:    Type:   Image     Comment:   External Document

## 2010-10-07 NOTE — Letter (Signed)
Summary: Established [facility] Clinic Note  Established [facility] Clinic Note   Imported By: Kassie Mends 09/28/2010 09:03:34  _____________________________________________________________________  External Attachment:    Type:   Image     Comment:   External Document  Appended Document: Established [facility] Clinic Note follow up from elevated enzymes in hospital (?? NSTEMI) discussed stress test but decided to hold off Echo normal with some diastolic dysfunction and no symptoms

## 2010-10-13 NOTE — Progress Notes (Signed)
Summary: alprazolam   Phone Note Refill Request Message from:  Fax from Pharmacy on October 04, 2010 1:45 PM  Refills Requested: Medication #1:  ALPRAZOLAM 1 MG TABS Take 1 tablet by mouth three times a day   Last Refilled: 08/31/2010 Refill request from [facility]. [phone]. Fax is on your desk.   Initial call taken by: Melody Comas,  October 04, 2010 1:46 PM  Follow-up for Phone Call        Okay #90 x 2 Follow-up by: Cindee Salt MD,  October 04, 2010 1:52 PM  Additional Follow-up for Phone Call Additional follow up Details #1::        Rx faxed to pharmacy Additional Follow-up by: DeShannon Smith CMA Duncan Dull),  October 04, 2010 2:04 PM    Prescriptions: ALPRAZOLAM 1 MG TABS (ALPRAZOLAM) Take 1 tablet by mouth three times a day  #90 x 2   Entered by:   Mervin Hack CMA (AAMA)   Authorized by:   Cindee Salt MD   Signed by:   Mervin Hack CMA (AAMA) on 10/04/2010   Method used:   Handwritten   RxID:   [id number]

## 2010-10-18 ENCOUNTER — Telehealth: Payer: Self-pay | Admitting: Internal Medicine

## 2010-10-21 ENCOUNTER — Encounter: Payer: Self-pay | Admitting: Internal Medicine

## 2010-10-25 ENCOUNTER — Telehealth: Payer: Self-pay | Admitting: Family Medicine

## 2010-10-27 ENCOUNTER — Encounter: Payer: Self-pay | Admitting: Family Medicine

## 2010-10-28 NOTE — Progress Notes (Signed)
Summary: blood pressure has been elevated   Phone Note Call from Patient   Caller: [name]  with [phone] Call For: [name] Salt MD Summary of Call: [facility] sasy that patient was seen by her GI Dr. about 3 weeks ago and her blood pressure was elevated while she was there. [name] says that she has seen her 3 times since then and each time her blood pressure seems to be creaping up.  Three weeks ago it was 138/92, the next week it was 152/96, and this morning it was 146/96. [name] is Alwyn Pea if she should go back on the Lisinopril. Please advise.  Initial call taken by: Melody Comas,  October 18, 2010 3:28 PM  Follow-up for Phone Call        Yes, probably best to go back on the lisinopril but only at 10mg , not the 20mg   she can cut the 20's in half till they run out but can also send new Rx for 1 year will check labs at her next visit Follow-up by: [name] Salt MD,  October 18, 2010 4:36 PM  Additional Follow-up for Phone Call Additional follow up Details #1::        left message for [facility] with results, advised her to call if any questions. Spoke with [facility] and advised results, she wanted the rx sent to [facility], rx sent to pharmacy. Additional Follow-up by: Mervin Hack CMA Duncan Dull),  October 19, 2010 8:23 AM    New/Updated Medications: LISINOPRIL 10 MG TABS (LISINOPRIL) take 1 by mouth once daily Prescriptions: LISINOPRIL 10 MG TABS (LISINOPRIL) take 1 by mouth once daily  #90 x 3   Entered by:   Mervin Hack CMA (AAMA)   Authorized by:   [name] Salt MD   Signed by:   Mervin Hack CMA (AAMA) on 10/19/2010   Method used:   Electronically to        [facility] [phone]* (retail)       [address] [place], [place]  [address]       Ph: [phone]       Fax: [phone]   RxID:   [id number]

## 2010-11-01 ENCOUNTER — Telehealth: Payer: Self-pay | Admitting: Internal Medicine

## 2010-11-01 ENCOUNTER — Encounter: Payer: Self-pay | Admitting: Internal Medicine

## 2010-11-02 NOTE — Progress Notes (Signed)
Summary: hydrocodone  Phone Note Refill Request Message from:  Fax from Pharmacy on October 25, 2010 10:36 AM  Refills Requested: Medication #1:  HYDROCODONE-ACETAMINOPHEN 5-500 MG TABS Take 1 tablet by mouth up to three times a day as needed   Last Refilled: 09/17/2010 Refill request from [facility] pharmacy. [phone]  Initial call taken by: Melody Comas,  October 25, 2010 10:36 AM  Follow-up for Phone Call        refilled. notify patient. Follow-up by: Eustaquio Boyden  MD,  October 25, 2010 10:38 AM  Additional Follow-up for Phone Call Additional follow up Details #1::        Rx called in as directed. Additional Follow-up by: Janee Morn CMA Duncan Dull),  October 25, 2010 11:56 AM    Prescriptions: HYDROCODONE-ACETAMINOPHEN 5-500 MG TABS (HYDROCODONE-ACETAMINOPHEN) Take 1 tablet by mouth up to three times a day as needed  #90 x 0   Entered and Authorized by:   Eustaquio Boyden  MD   Signed by:   Eustaquio Boyden  MD on 10/25/2010   Method used:   Telephoned to ...       [facility] [address] [phone]* (retail)       [address] [place], [place]  [address]       Ph: [phone]       Fax: [phone]   RxID:   [id number]

## 2010-11-02 NOTE — Miscellaneous (Signed)
Summary: Physician's Interim Order/[facility]  Physician's Interim Order/[facility]   Imported By: Maryln Gottron 10/27/2010 12:31:31  _____________________________________________________________________  External Attachment:    Type:   Image     Comment:   External Document

## 2010-11-02 NOTE — Miscellaneous (Signed)
Summary: Physician's Interim Order/[facility]  Physician's Interim Order/[facility]   Imported By: Maryln Gottron 10/28/2010 14:56:00  _____________________________________________________________________  External Attachment:    Type:   Image     Comment:   External Document

## 2010-11-03 ENCOUNTER — Encounter: Payer: Self-pay | Admitting: Internal Medicine

## 2010-11-03 ENCOUNTER — Ambulatory Visit (INDEPENDENT_AMBULATORY_CARE_PROVIDER_SITE_OTHER): Payer: Medicare Other | Admitting: Internal Medicine

## 2010-11-03 DIAGNOSIS — I509 Heart failure, unspecified: Secondary | ICD-10-CM

## 2010-11-03 DIAGNOSIS — I5032 Chronic diastolic (congestive) heart failure: Secondary | ICD-10-CM

## 2010-11-03 DIAGNOSIS — E119 Type 2 diabetes mellitus without complications: Secondary | ICD-10-CM

## 2010-11-03 DIAGNOSIS — R634 Abnormal weight loss: Secondary | ICD-10-CM

## 2010-11-09 NOTE — Letter (Signed)
Summary: Request for In-Home Care Services/[facility]  Request for In-Home Care Services/[facility]   Imported By: Maryln Gottron 11/05/2010 14:16:10  _____________________________________________________________________  External Attachment:    Type:   Image     Comment:   External Document

## 2010-11-09 NOTE — Progress Notes (Signed)
Summary: update on BP   Phone Note Call from Patient   Caller: Tamera Punt- [phone] Call For: Cindee Salt MD Summary of Call: [facility] wanted to let you know that since patient has been back on the lisinopril her BP has come down, but still not where they should be. It has been running between 140-150/ 80-90. Patient has an appt to see on wed of this week, Misty just wanted you to be aware.  Initial call taken by: Melody Comas,  November 01, 2010 1:39 PM  Follow-up for Phone Call        actually I am quite satisfied with those numbers  will review at her OV coming up Follow-up by: Cindee Salt MD,  November 01, 2010 1:47 PM

## 2010-11-09 NOTE — Assessment & Plan Note (Signed)
Summary: 2 MONTH FOLLOW UP   Vital Signs:  Patient profile:   77 year old female Weight:      153 pounds Temp:     98.6 degrees F oral Pulse rate:   62 / minute Pulse rhythm:   regular BP sitting:   148 / 80  (left arm) Cuff size:   regular  Vitals Entered By: Mervin Hack CMA Duncan Dull) (November 03, 2010 11:29 AM) CC: follow-up   History of Present Illness: Doing okay here with DIL [name] again  has been eating better so sugars up some Sugars as high as 300 Mostly in the 200's has used extra regular insulin  Back on lisinopril 10mg  BP seems to be in the good range  discussed calcium--has enough in her multivitamin  Arthritis okay can try off osteobiflex  Allergies: 1)  ! Celexa (Citalopram Hydrobromide) 2)  Reglan (Metoclopramide Hcl) 3)  Benadryl Allergy Childrens (Diphenhydramine Hcl) 4)  Paxil (Paroxetine Hcl) 5)  Morphine Sulfate (Morphine Sulfate)  Past History:  Past medical, surgical, family and social histories (including risk factors) reviewed for relevance to current acute and chronic problems.  Past Medical History: Reviewed history from 10/27/2008 and no changes required. Anxiety Congestive heart failure--diastolic Depression Diabetes mellitus, type II GERD Hypertension Hypothyroidism Osteoporosis Pancreatic failure Restless legs syndrome Allergic rhinitis Hyperlipidemia Urinary incontinence  Past Surgical History: Reviewed history from 03/07/2007 and no changes required. 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 shoulder/ pneumonia 12/05 Kyphoplasty  11/05 DEXA- normal 11/03 Diplopia- ? TIA, Carotid negative 11/07  Family History: Reviewed history from 09/10/2007 and no changes required. Father: Died in his 57's, MI Mother: Died in  her 14's MI, DM Siblings:! brother, 4 sisters- one deceased from a fall CAD- strong HTN and DM in family Daughter with adenomatous polyps   Social History: Reviewed history from 03/17/2008 and no changes required. Marital Status: Widowed 1989 Children: 7 Retired from [employer] Never Smoked Alcohol use-no  Review of Systems       Weight down a few pounds Mood seems to be better on the megace sleeping okay  Physical Exam  General:  alert and normal appearance.   Neck:  supple, no masses, and no cervical lymphadenopathy.   Lungs:  normal respiratory effort, no intercostal retractions, no accessory muscle use, and normal breath sounds.   Heart:  normal rate, regular rhythm, no murmur, and no gallop.   Extremities:  no edema Psych:  normally interactive, good eye contact, not anxious appearing, and not depressed appearing.     Impression & Recommendations:  Problem # 1:  WEIGHT LOSS, ABNORMAL (ICD-783.21) Assessment Unchanged lost a little but appetite is better on megace now consider mirtazapine if long term use needed  Problem # 2:  DIABETES MELLITUS, TYPE II (ICD-250.00) Assessment: Deteriorated higher with eating more discussed increasing insulin doses  Her updated medication list for this problem includes:    Lisinopril 10 Mg Tabs (Lisinopril) .Marland Kitchen... Take 1 by mouth once daily    Lantus 100 Unit/ml Soln (Insulin glargine) .Marland Kitchen... 20  units at bedtime    Novolin R 100 Unit/ml Inj Soln (Insulin regular human) .Marland Kitchen... Take 3 units before meals if 200-300, 5 units if 301-400 and 8 units if over 400    Aspirin 81 Mg Tabs (Aspirin) .Marland Kitchen... Take 1 by mouth once daily  Problem # 3:  FAILURE, DIASTOLIC HEART, CHRONIC (ICD-428.32) Assessment: Unchanged stable status  Her updated medication list for this problem includes:    Lisinopril 10 Mg Tabs (Lisinopril) .Marland Kitchen... Take 1 by mouth once daily    Metoprolol Succinate 100 Mg Xr24h-tab (Metoprolol succinate) .Marland Kitchen... Take one by  mouth daily    Aspirin 81 Mg Tabs (Aspirin) .Marland Kitchen... Take 1 by mouth once daily    Furosemide 20 Mg Tabs (Furosemide) .Marland Kitchen... Take 1 by mouth once daily  Complete Medication List: 1)  Megace Es 625 Mg/71ml Susp (Megestrol acetate) .... Take 5ml once daily 2)  Lisinopril 10 Mg Tabs (Lisinopril) .... Take 1 by mouth once daily 3)  Diphenoxylate-atropine 2.5-0.025 Mg Tabs (Diphenoxylate-atropine) .... Take 1 tablet by mouth four times a day as needed diarrhea 4)  Alprazolam 1 Mg Tabs (Alprazolam) .... Take 1 tablet by mouth three times a day 5)  Hydrocodone-acetaminophen 5-500 Mg Tabs (Hydrocodone-acetaminophen) .... Take 1 tablet by mouth up to three times a day as needed 6)  Pravastatin Sodium 20 Mg Tabs (Pravastatin sodium) .... Take 1 tablet by mouth every night 7)  Lantus 100 Unit/ml Soln (Insulin glargine) .... 20  units at bedtime 8)  Novolin R 100 Unit/ml Inj Soln (Insulin regular human) .... Take 3 units before meals if 200-300, 5 units if 301-400 and 8 units if over 400 9)  Synthroid 75 Mcg Tabs (Levothyroxine sodium) .... Take one tablet daily 10)  Prilosec 20 Mg Cpdr (Omeprazole) .... Take one by mouth daily 11)  Carbidopa-levodopa Cr 25-100 Mg Tbcr (Carbidopa-levodopa) .... Take 1 tablet by mouth three times a day 12)  Metoprolol Succinate 100 Mg Xr24h-tab (Metoprolol succinate) .... Take one by mouth daily 13)  Buspirone Hcl 10 Mg Tabs (Buspirone hcl) .Marland Kitchen.. 1 two times a day 14)  Zenpep 5000 Unit Cpep (Pancrelipase (lip-prot-amyl)) .... Take 3 capsules by mouth three times a day 15)  Aspirin 81 Mg Tabs (Aspirin) .... Take 1 by mouth once daily 16)  Bd Eclipse Syringe 30g X 1/2" 1 Ml Misc (Syringe/needle (disp)) .... As directed 17)  Osteo Bi-flex Regular Strength 250-200 Mg Tabs (Glucosamine-chondroitin) .... Take one tablet by mouth daily 18)  Hydroxyzine Hcl 25 Mg Tabs (Hydroxyzine hcl) .... Take one tablet by mouth every 6 hours as needed for itching 19)  Centrum Silver Tabs (Multiple  vitamins-minerals) .... Take one tablet by mouth daily 20)  Furosemide 20 Mg Tabs (Furosemide) .... Take 1 by mouth once daily 21)  Ondansetron Hcl 4 Mg Tabs (Ondansetron hcl) .... Take 1 by mouth every 8 hours as needed for nausea  Patient Instructions: 1)  Please stop the calcium. 2)  Okay to try stopping osteobiflex----if no increase in pain, stay off it 3)  Please schedule a follow-up appointment in 2 months.    Orders Added: 1)  Est. Patient Level IV [04540]    Current Allergies (reviewed today): ! CELEXA (CITALOPRAM HYDROBROMIDE) REGLAN (METOCLOPRAMIDE HCL) BENADRYL ALLERGY CHILDRENS (DIPHENHYDRAMINE HCL) PAXIL (PAROXETINE HCL) MORPHINE SULFATE (MORPHINE SULFATE)

## 2010-11-30 ENCOUNTER — Other Ambulatory Visit: Payer: Self-pay | Admitting: *Deleted

## 2010-12-01 ENCOUNTER — Telehealth: Payer: Self-pay | Admitting: *Deleted

## 2010-12-01 ENCOUNTER — Ambulatory Visit: Payer: Self-pay | Admitting: Ophthalmology

## 2010-12-01 MED ORDER — ONDANSETRON HCL 4 MG PO TABS: ORAL_TABLET | ORAL | Status: DC

## 2010-12-01 NOTE — Telephone Encounter (Signed)
rx sent to pharmacy

## 2010-12-01 NOTE — Telephone Encounter (Signed)
Okay #30 x 0 

## 2010-12-01 NOTE — Telephone Encounter (Signed)
Spoke with daughter in law, [name] and asked if patient was requesting a knee brace? Received form asking for documentation for knee brace. Per [name] she will check with patient and see if this was requested, she seems to think the company may have called and spoke with patient and she just agreed to order brace.

## 2010-12-03 NOTE — Telephone Encounter (Signed)
Daughter in law called back and said to cancel that form, the pt doesn't know anything about ordering a knee brace. I will fax form back stating pt doesn't want brace.

## 2010-12-08 ENCOUNTER — Encounter: Payer: Self-pay | Admitting: Nurse Practitioner

## 2010-12-08 ENCOUNTER — Encounter: Payer: Self-pay | Admitting: Cardiothoracic Surgery

## 2010-12-10 DIAGNOSIS — I10 Essential (primary) hypertension: Secondary | ICD-10-CM

## 2010-12-10 DIAGNOSIS — T8189XA Other complications of procedures, not elsewhere classified, initial encounter: Secondary | ICD-10-CM

## 2010-12-10 DIAGNOSIS — E119 Type 2 diabetes mellitus without complications: Secondary | ICD-10-CM

## 2010-12-16 ENCOUNTER — Other Ambulatory Visit: Payer: Self-pay | Admitting: *Deleted

## 2010-12-16 MED ORDER — HYDROCODONE-ACETAMINOPHEN 5-500 MG PO CAPS: ORAL_CAPSULE | ORAL | Status: DC

## 2010-12-16 NOTE — Telephone Encounter (Signed)
Rx called to [facility]. 

## 2010-12-21 ENCOUNTER — Encounter: Payer: Self-pay | Admitting: Cardiothoracic Surgery

## 2010-12-21 ENCOUNTER — Encounter: Payer: Self-pay | Admitting: Nurse Practitioner

## 2011-01-10 ENCOUNTER — Telehealth: Payer: Self-pay | Admitting: *Deleted

## 2011-01-10 NOTE — Telephone Encounter (Signed)
[facility] nurse called to report that pt fell and hurt left knee over the week end.  Her family picked her up but didn't take her to ER.  She is having some pain, not putting any weight on that leg.  Please advise on what to do.

## 2011-01-10 NOTE — Telephone Encounter (Signed)
If she is having ongoing severe pain, will need to be seen ---here or in ER Please check on her later or tomorrow

## 2011-01-12 NOTE — Telephone Encounter (Signed)
Spoke with nurse, per pt leg is not hurting as bad, had x-ray not broken, having MRI tomorrow. Per nurse pt is doing better.

## 2011-01-12 NOTE — Telephone Encounter (Signed)
Good to hear

## 2011-01-18 ENCOUNTER — Telehealth: Payer: Self-pay | Admitting: *Deleted

## 2011-01-18 NOTE — Telephone Encounter (Signed)
Spoke with daughter in-law Katilynn Sinkler and advised results, also pt states she gets phone calls from all kinds of medical supply companies asking her to order stuff and per family pt does not need anything.

## 2011-01-18 NOTE — Telephone Encounter (Signed)
[facility] nurse called to report that pt was given celebrex 200 mg's by her ortho, for arthritis pain and inflammation.  She was given samples but was not instructed on how to take it.  Pt is asking if you thinks it's ok for her and also to advise on how she should take it.

## 2011-01-18 NOTE — Telephone Encounter (Signed)
I would not recommend celebrex regularly for her due to her heart and blood pressure history It would be okay to take celebrex 200mg  daily prn--if only once or twice a week It would be safer to take tylenol for her pain---either 650mg  or 1000mg  up to three times daily

## 2011-01-20 ENCOUNTER — Other Ambulatory Visit: Payer: Self-pay | Admitting: *Deleted

## 2011-01-20 MED ORDER — HYDROCODONE-ACETAMINOPHEN 5-500 MG PO CAPS: ORAL_CAPSULE | ORAL | Status: DC

## 2011-01-20 NOTE — Telephone Encounter (Signed)
Per handwritten fax, refill ok per Dr.Letvak 90x0.  Spoke with [facility] and advised rx ready for pick-up

## 2011-01-20 NOTE — Telephone Encounter (Signed)
Faxed request from [facility] is on your desk.

## 2011-01-21 ENCOUNTER — Other Ambulatory Visit: Payer: Self-pay | Admitting: *Deleted

## 2011-01-21 ENCOUNTER — Encounter: Payer: Self-pay | Admitting: Nurse Practitioner

## 2011-01-21 ENCOUNTER — Encounter: Payer: Self-pay | Admitting: Cardiothoracic Surgery

## 2011-01-21 MED ORDER — DIPHENOXYLATE-ATROPINE 2.5-0.025 MG PO TABS: 1.0000 | ORAL_TABLET | Freq: Four times a day (QID) | ORAL | Status: DC | PRN

## 2011-01-21 NOTE — Telephone Encounter (Signed)
Opened in error.  Rx already filled

## 2011-01-21 NOTE — Telephone Encounter (Signed)
rx faxed to pharmacy manually  

## 2011-01-21 NOTE — Telephone Encounter (Signed)
Okay #30 x 1 

## 2011-01-24 ENCOUNTER — Other Ambulatory Visit: Payer: Self-pay | Admitting: *Deleted

## 2011-01-24 MED ORDER — LEVOTHYROXINE SODIUM 75 MCG PO TABS: 75.0000 ug | ORAL_TABLET | Freq: Every day | ORAL | Status: DC

## 2011-01-26 ENCOUNTER — Encounter: Payer: Self-pay | Admitting: Internal Medicine

## 2011-02-01 ENCOUNTER — Other Ambulatory Visit: Payer: Self-pay | Admitting: *Deleted

## 2011-02-01 MED ORDER — CARBIDOPA-LEVODOPA 25-100 MG PO TABS: 1.0000 | ORAL_TABLET | Freq: Three times a day (TID) | ORAL | Status: DC

## 2011-02-07 DIAGNOSIS — I10 Essential (primary) hypertension: Secondary | ICD-10-CM

## 2011-02-07 DIAGNOSIS — T8131XA Disruption of external operation (surgical) wound, not elsewhere classified, initial encounter: Secondary | ICD-10-CM

## 2011-02-07 DIAGNOSIS — E119 Type 2 diabetes mellitus without complications: Secondary | ICD-10-CM

## 2011-02-16 ENCOUNTER — Encounter: Payer: Self-pay | Admitting: Internal Medicine

## 2011-02-18 ENCOUNTER — Encounter: Payer: Self-pay | Admitting: Internal Medicine

## 2011-02-18 ENCOUNTER — Ambulatory Visit (INDEPENDENT_AMBULATORY_CARE_PROVIDER_SITE_OTHER): Payer: Medicare Other | Admitting: Internal Medicine

## 2011-02-18 VITALS — BP 150/80 | HR 63 | Temp 98.7°F | Ht 60.0 in | Wt 161.0 lb

## 2011-02-18 DIAGNOSIS — I5032 Chronic diastolic (congestive) heart failure: Secondary | ICD-10-CM

## 2011-02-18 DIAGNOSIS — I10 Essential (primary) hypertension: Secondary | ICD-10-CM

## 2011-02-18 DIAGNOSIS — I509 Heart failure, unspecified: Secondary | ICD-10-CM

## 2011-02-18 DIAGNOSIS — E039 Hypothyroidism, unspecified: Secondary | ICD-10-CM

## 2011-02-18 DIAGNOSIS — E119 Type 2 diabetes mellitus without complications: Secondary | ICD-10-CM

## 2011-02-18 DIAGNOSIS — R634 Abnormal weight loss: Secondary | ICD-10-CM

## 2011-02-18 DIAGNOSIS — F411 Generalized anxiety disorder: Secondary | ICD-10-CM

## 2011-02-18 LAB — CBC WITH DIFFERENTIAL/PLATELET
Basophils Absolute: 0 10*3/uL (ref 0.0–0.1)
Basophils Relative: 0.3 % (ref 0.0–3.0)
Eosinophils Absolute: 0.1 10*3/uL (ref 0.0–0.7)
Eosinophils Relative: 0.7 % (ref 0.0–5.0)
HCT: 43 % (ref 36.0–46.0)
Hemoglobin: 14.5 g/dL (ref 12.0–15.0)
Lymphocytes Relative: 25.3 % (ref 12.0–46.0)
Lymphs Abs: 2 10*3/uL (ref 0.7–4.0)
MCHC: 33.7 g/dL (ref 30.0–36.0)
MCV: 95.3 fl (ref 78.0–100.0)
Monocytes Absolute: 0.5 10*3/uL (ref 0.1–1.0)
Monocytes Relative: 6.4 % (ref 3.0–12.0)
Neutro Abs: 5.4 10*3/uL (ref 1.4–7.7)
Neutrophils Relative %: 67.3 % (ref 43.0–77.0)
Platelets: 121 10*3/uL — ABNORMAL LOW (ref 150.0–400.0)
RBC: 4.51 Mil/uL (ref 3.87–5.11)
RDW: 14.3 % (ref 11.5–14.6)
WBC: 8 10*3/uL (ref 4.5–10.5)

## 2011-02-18 LAB — BASIC METABOLIC PANEL
BUN: 23 mg/dL (ref 6–23)
CO2: 30 mEq/L (ref 19–32)
Calcium: 9.4 mg/dL (ref 8.4–10.5)
Chloride: 104 mEq/L (ref 96–112)
Creatinine, Ser: 0.9 mg/dL (ref 0.4–1.2)
GFR: 65.79 mL/min (ref >60.00)
Glucose, Bld: 254 mg/dL — ABNORMAL HIGH (ref 70–99)
Potassium: 4.5 mEq/L (ref 3.5–5.1)
Sodium: 142 mEq/L (ref 135–145)

## 2011-02-18 LAB — TSH: TSH: 0.46 u[IU]/mL (ref 0.35–5.50)

## 2011-02-18 LAB — LIPID PANEL
Cholesterol: 161 mg/dL (ref 0–200)
Triglycerides: 147 mg/dL (ref 0.0–149.0)
VLDL: 29.4 mg/dL (ref 0.0–40.0)

## 2011-02-18 LAB — HEPATIC FUNCTION PANEL
Albumin: 4.1 g/dL (ref 3.5–5.2)
Total Protein: 6.8 g/dL (ref 6.0–8.3)

## 2011-02-18 LAB — HEMOGLOBIN A1C: Hgb A1c MFr Bld: 9.6 % — ABNORMAL HIGH (ref 4.6–6.5)

## 2011-02-18 NOTE — Assessment & Plan Note (Signed)
NYHA class 3 symptoms but mostly deconditioning I think No fluid overload

## 2011-02-18 NOTE — Assessment & Plan Note (Signed)
Lab Results  Component Value Date   TSH 0.54 12/03/2009   Due for labs

## 2011-02-18 NOTE — Assessment & Plan Note (Addendum)
Better now since back on the megace Will continue for now

## 2011-02-18 NOTE — Patient Instructions (Signed)
Please take 4 units of novolog if your sugar is 200-300, 6 units if it is 301-400 and 8 units if over 400

## 2011-02-18 NOTE — Assessment & Plan Note (Signed)
Control is worse Will give insulin coverage scale Recheck a1c

## 2011-02-18 NOTE — Assessment & Plan Note (Signed)
BP Readings from Last 3 Encounters:  02/18/11 150/80  11/03/10 148/80  09/01/10 138/80   Reasonable control No changes for now

## 2011-02-18 NOTE — Assessment & Plan Note (Signed)
Acts up occ Uses xanax prn

## 2011-02-18 NOTE — Progress Notes (Signed)
Subjective:    Patient ID: [name], female    DOB: 11-05-1928, 77 y.o.   MRN: [id number]  HPI In room alone--son brought her today. Resumed megace due to more weight loss about 2 months ago Weight up 8# since then No apparent problems on the med  Sugars have been running high Has gotten cortisone shot in knee (?supartz)--at [facility]----this raised her sugar Uses extra regular insulin if high---used 8 units for 400 once Checks tid before meals No hypoglycemic reactions  No chest pain Stable DOE--can only stay up only 5-10 minutes. Only can walk a short way without rest Able to walk in house okay Doesn't do much housework  Not depressed Gets anxious at times---nerve pill and lying down will help  No edema Occ heart is fast if she stays up too long  Current Outpatient Prescriptions on File Prior to Visit  Medication Sig Dispense Refill  . ALPRAZolam (XANAX) 1 MG tablet Take 1 mg by mouth 3 (three) times daily as needed.        Marland Kitchen aspirin 81 MG tablet Take 81 mg by mouth daily.        . busPIRone (BUSPAR) 10 MG tablet Take 10 mg by mouth 2 (two) times daily.        . carbidopa-levodopa (SINEMET) 25-100 MG per tablet Take 1 tablet by mouth 3 (three) times daily.  90 tablet  6  . diphenoxylate-atropine (LOMOTIL) 2.5-0.025 MG per tablet Take 1 tablet by mouth 4 (four) times daily as needed for diarrhea/loose stools.  30 tablet  1  . furosemide (LASIX) 20 MG tablet Take 20 mg by mouth daily.        . Glucosamine-Chondroitin (OSTEO BI-FLEX REGULAR STRENGTH PO) Take 1 tablet by mouth daily.        . hydrocodone-acetaminophen (LORCET-HD) 5-500 MG per capsule Take 1 tab by mouth up to 3 times a day as needed.  90 capsule  0  . hydrOXYzine (ATARAX) 25 MG tablet Take 25 mg by mouth every 6 (six) hours as needed.        . insulin glargine (LANTUS) 100 UNIT/ML injection Inject 20 Units into the skin at bedtime.        . insulin regular (NOVOLIN R) 100 UNIT/ML injection Inject into  the skin as directed.        Marland Kitchen levothyroxine (SYNTHROID, LEVOTHROID) 75 MCG tablet Take 1 tablet (75 mcg total) by mouth daily.  30 tablet  11  . lisinopril (PRINIVIL,ZESTRIL) 10 MG tablet Take 10 mg by mouth daily.        . megestrol (MEGACE ES) 625 MG/5ML suspension Take 625 mg by mouth daily.        . metoprolol (TOPROL-XL) 100 MG 24 hr tablet Take 100 mg by mouth daily.        . Multiple Vitamins-Minerals (CENTRUM SILVER PO) Take 1 tablet by mouth daily.        Marland Kitchen omeprazole (PRILOSEC) 20 MG capsule Take 20 mg by mouth daily.        . ondansetron (ZOFRAN) 4 MG tablet take 1 by mouth every 8 hours as needed for nausea  30 tablet  0  . Pancrelipase, Lip-Prot-Amyl, 5000 UNITS CPEP Take 3 capsules by mouth 3 (three) times daily.        . pravastatin (PRAVACHOL) 20 MG tablet Take 20 mg by mouth daily.        . Syringe/Needle, Disp, (B-D ECLIPSE SYRINGE) 30G X 1/2" 1 ML MISC  as directed.          Allergies  Allergen Reactions  . Citalopram Hydrobromide     REACTION: nausea, dizzy, dry mouth  . Diphenhydramine Hcl     REACTION: unspecified  . Metoclopramide Hcl     REACTION: unspecified  . Morphine Sulfate     REACTION: IV administration caused itching  . Paroxetine     REACTION: hallucinations    Past Medical History  Diagnosis Date  . Anxiety   . CHF (congestive heart failure)   . Depression   . Diabetes mellitus   . GERD (gastroesophageal reflux disease)   . Hypertension   . Thyroid disease   . Osteoporosis   . Allergy   . Hyperlipidemia   . RLS (restless legs syndrome)   . Pancreatic disease     pancreatic failure  . Urinary incontinence   . Pancreatitis     Pancreatic resection/ open wound- [facility] 11/04  . Pneumonia     Pneumonia/ Emphysema 05/05  . Diplopia     Diplopia- ? TIA, Carotid negative 11/07    Past Surgical History  Procedure Date  . Cholecystectomy   . Breast lumpectomy     Lumpectomy right breast  . Esophagogastroduodenoscopy     gastric  varices/ splenic vein thrombosis 12/05  . Abdominal hysterectomy     Hysteroscopy/ D & C ([facility]) 12/05  . Kyphosis surgery 11/05    No family history on file.  History   Social History  . Marital Status: Widowed    Spouse Name: N/A    Number of Children: 7  . Years of Education: N/A   Occupational History  . retired- Veterinary surgeon    Social History Main Topics  . Smoking status: Never Smoker   . Smokeless tobacco: Never Used  . Alcohol Use: No  . Drug Use: No  . Sexually Active: Not on file   Other Topics Concern  . Not on file   Social History Narrative  . No narrative on file    Review of Systems Not sleeping great of late--seems worse since knee injections Still uses meds for restless legs    Objective:   Physical Exam  Constitutional: She appears well-developed and well-nourished. No distress.  Neck: Normal range of motion. Neck supple.  Cardiovascular: Normal rate, regular rhythm and normal heart sounds.  Exam reveals no gallop.   No murmur heard.      Faint pulse on left, not palpable on right but both warm  Pulmonary/Chest: Effort normal and breath sounds normal. No respiratory distress. She has no wheezes. She has no rales.  Abdominal: Soft. There is no tenderness.  Musculoskeletal: She exhibits no edema and no tenderness.  Lymphadenopathy:    She has no cervical adenopathy.  Skin:       Mycotic toenails abd wound about the same--only very small open area          Assessment & Plan:

## 2011-02-20 ENCOUNTER — Encounter: Payer: Self-pay | Admitting: Cardiothoracic Surgery

## 2011-02-20 ENCOUNTER — Encounter: Payer: Self-pay | Admitting: Nurse Practitioner

## 2011-02-22 ENCOUNTER — Encounter: Payer: Self-pay | Admitting: *Deleted

## 2011-02-24 ENCOUNTER — Other Ambulatory Visit: Payer: Self-pay | Admitting: *Deleted

## 2011-02-25 ENCOUNTER — Telehealth: Payer: Self-pay | Admitting: *Deleted

## 2011-02-25 ENCOUNTER — Other Ambulatory Visit: Payer: Self-pay | Admitting: *Deleted

## 2011-02-25 MED ORDER — METOPROLOL SUCCINATE ER 100 MG PO TB24: 100.0000 mg | ORAL_TABLET | Freq: Every day | ORAL | Status: DC

## 2011-02-25 MED ORDER — HYDROCODONE-ACETAMINOPHEN 5-500 MG PO CAPS: ORAL_CAPSULE | ORAL | Status: DC

## 2011-02-25 NOTE — Telephone Encounter (Signed)
rx called into pharmacy

## 2011-02-25 NOTE — Telephone Encounter (Signed)
Okay #90 x 0 of hydrocodone

## 2011-02-25 NOTE — Telephone Encounter (Signed)
Pharmacy has request for [2nd] med-see prior documentation- Request for: Hydrocodone-APAP 5-500mg  [last refill 01/20/11 #90x0]

## 2011-02-25 NOTE — Telephone Encounter (Signed)
rx sent to pharmacy by e-script  

## 2011-02-25 NOTE — Telephone Encounter (Signed)
Pharmacy request for: Alprazolam 1mg  tab [last refill 10/04/10 #90xs]

## 2011-03-01 ENCOUNTER — Other Ambulatory Visit: Payer: Self-pay | Admitting: *Deleted

## 2011-03-01 NOTE — Telephone Encounter (Signed)
Faxed request from [facility] is on your desk. 

## 2011-03-02 MED ORDER — ALPRAZOLAM 1 MG PO TABS: 1.0000 mg | ORAL_TABLET | Freq: Three times a day (TID) | ORAL | Status: DC | PRN

## 2011-03-02 NOTE — Telephone Encounter (Signed)
rx faxed to pharmacy manually  

## 2011-03-02 NOTE — Telephone Encounter (Signed)
Okay #90 x 0 

## 2011-03-13 DIAGNOSIS — T8189XA Other complications of procedures, not elsewhere classified, initial encounter: Secondary | ICD-10-CM

## 2011-03-13 DIAGNOSIS — B958 Unspecified staphylococcus as the cause of diseases classified elsewhere: Secondary | ICD-10-CM

## 2011-03-13 DIAGNOSIS — T8140XA Infection following a procedure, unspecified, initial encounter: Secondary | ICD-10-CM

## 2011-03-21 ENCOUNTER — Observation Stay: Payer: Self-pay | Admitting: Internal Medicine

## 2011-03-23 ENCOUNTER — Encounter: Payer: Self-pay | Admitting: Cardiothoracic Surgery

## 2011-03-23 ENCOUNTER — Encounter: Payer: Self-pay | Admitting: Nurse Practitioner

## 2011-03-24 ENCOUNTER — Telehealth: Payer: Self-pay | Admitting: *Deleted

## 2011-03-24 MED ORDER — METOPROLOL SUCCINATE ER 200 MG PO TB24: 100.0000 mg | ORAL_TABLET | Freq: Every day | ORAL | Status: DC

## 2011-03-24 MED ORDER — LISINOPRIL 20 MG PO TABS: 20.0000 mg | ORAL_TABLET | Freq: Every day | ORAL | Status: DC

## 2011-03-24 NOTE — Telephone Encounter (Signed)
Hospice nurse is calling.  Pt was discharged from hospital on Tuesday.  She was there for high blood pressure.  Nurse states that she is visiting pt in her home and pt's BP is 198/110.  Pt is having no symptoms with this but nurse is very concerned.  She is asking if pt should go back to ER.  Pt takes metoprolol 100 mg's, one a day, and lisinopril 10 mg's one a day.  Should nurse give her additional medicine this morning, or send to ER?

## 2011-03-24 NOTE — Telephone Encounter (Signed)
Called again and RN called in office today. BP 180/112 this afternoon, but the patient remains asymptomatic.  Increase the lisinopril to 20 mg a day. (additional 10 mg tablet given this evening)  Cc: Dr. Alphonsus Sias

## 2011-03-24 NOTE — Telephone Encounter (Signed)
Called and discussed with hospice. Patient asymptomatic.  Increased Toprol-XL dosing to 200 mg a day, and then hospice / [facility] will recheck the patient in the morning. Was on same meds prior to hosp discharge.

## 2011-03-24 NOTE — Telephone Encounter (Signed)
Addended by: Hannah Beat on: 03/24/2011 02:51 PM   Modules accepted: Orders

## 2011-03-24 NOTE — Telephone Encounter (Signed)
[phone] 

## 2011-03-25 ENCOUNTER — Telehealth: Payer: Self-pay | Admitting: *Deleted

## 2011-03-25 NOTE — Telephone Encounter (Signed)
BP today is 142/84.Marland Kitchen  She will call in Metoprolol 200 mg daily  and Lisinopril 40 mg daily as instructed to give yesterday.  Call if you have any questions.

## 2011-03-26 NOTE — Telephone Encounter (Signed)
Please set her up with appt this coming week

## 2011-03-27 NOTE — Telephone Encounter (Signed)
Improved and noted.

## 2011-03-28 ENCOUNTER — Other Ambulatory Visit: Payer: Self-pay | Admitting: *Deleted

## 2011-03-28 MED ORDER — DIPHENOXYLATE-ATROPINE 2.5-0.025 MG PO TABS: 1.0000 | ORAL_TABLET | Freq: Four times a day (QID) | ORAL | Status: DC | PRN

## 2011-03-28 MED ORDER — OMEPRAZOLE 20 MG PO CPDR: 20.0000 mg | DELAYED_RELEASE_CAPSULE | Freq: Every day | ORAL | Status: DC

## 2011-03-28 MED ORDER — ALPRAZOLAM 1 MG PO TABS: 1.0000 mg | ORAL_TABLET | Freq: Three times a day (TID) | ORAL | Status: DC | PRN

## 2011-03-28 MED ORDER — HYDROCODONE-ACETAMINOPHEN 5-500 MG PO CAPS: ORAL_CAPSULE | ORAL | Status: DC

## 2011-03-28 MED ORDER — FUROSEMIDE 20 MG PO TABS: 20.0000 mg | ORAL_TABLET | Freq: Every day | ORAL | Status: DC

## 2011-03-28 NOTE — Telephone Encounter (Signed)
Okay #30 x 0 

## 2011-03-28 NOTE — Telephone Encounter (Signed)
Faxed forms for xanax, vicodin are on your desk.

## 2011-03-28 NOTE — Telephone Encounter (Signed)
Okay #90 x 0 for each of them

## 2011-03-28 NOTE — Telephone Encounter (Signed)
Called pt to schedule appt.  She said she will check and see if her son can bring her in Thursday afternoon and she will call back.

## 2011-03-28 NOTE — Telephone Encounter (Signed)
Faxed request from [facility] is on your desk.

## 2011-04-07 ENCOUNTER — Ambulatory Visit (INDEPENDENT_AMBULATORY_CARE_PROVIDER_SITE_OTHER): Payer: Medicare Other | Admitting: Internal Medicine

## 2011-04-07 ENCOUNTER — Encounter: Payer: Self-pay | Admitting: Internal Medicine

## 2011-04-07 VITALS — BP 160/90 | HR 90 | Temp 98.2°F | Ht 60.0 in | Wt 166.0 lb

## 2011-04-07 DIAGNOSIS — I10 Essential (primary) hypertension: Secondary | ICD-10-CM

## 2011-04-07 NOTE — Assessment & Plan Note (Signed)
I got 142/90 on right Now has better control with higher toprol and lisinopril Will recheck met-b Keep regular follow up in a couple of months

## 2011-04-07 NOTE — Progress Notes (Signed)
Subjective:    Patient ID: [name], female    DOB: 09-Jul-1929, 77 y.o.   MRN: [id number]  HPI Has been having high blood pressure noted since hospitalization last month Has had nurse visits in past couple of weeks and meds increased (toprol and lisinopril)  Has had cold for a couple of weeks Low grade fever several days ago No sig cough Just head stuffiness  No headaches since the hospital No chest pain SOB when walking---may be some worse than a few months ago (but not worse than when at hospital and after)  Current Outpatient Prescriptions on File Prior to Visit  Medication Sig Dispense Refill  . ALPRAZolam (XANAX) 1 MG tablet Take 1 tablet (1 mg total) by mouth 3 (three) times daily as needed.  90 tablet  0  . aspirin 81 MG tablet Take 81 mg by mouth daily.        . busPIRone (BUSPAR) 10 MG tablet Take 10 mg by mouth 2 (two) times daily.        . carbidopa-levodopa (SINEMET) 25-100 MG per tablet Take 1 tablet by mouth 3 (three) times daily.  90 tablet  6  . diphenoxylate-atropine (LOMOTIL) 2.5-0.025 MG per tablet Take 1 tablet by mouth 4 (four) times daily as needed for diarrhea/loose stools.  30 tablet  1  . furosemide (LASIX) 20 MG tablet Take 1 tablet (20 mg total) by mouth daily.  30 tablet  11  . Glucosamine-Chondroitin (OSTEO BI-FLEX REGULAR STRENGTH PO) Take 1 tablet by mouth daily.        . hydrocodone-acetaminophen (LORCET-HD) 5-500 MG per capsule Take 1 tab by mouth up to 3 times a day as needed.  90 capsule  0  . hydrOXYzine (ATARAX) 25 MG tablet Take 25 mg by mouth every 6 (six) hours as needed.        . insulin glargine (LANTUS) 100 UNIT/ML injection Inject 20 Units into the skin at bedtime.        . insulin regular (NOVOLIN R) 100 UNIT/ML injection Inject into the skin as directed.        Marland Kitchen levothyroxine (SYNTHROID, LEVOTHROID) 75 MCG tablet Take 1 tablet (75 mcg total) by mouth daily.  30 tablet  11  . lisinopril (PRINIVIL,ZESTRIL) 20 MG tablet Take 1 tablet (20  mg total) by mouth daily.  1 tablet  0  . metoprolol (TOPROL-XL) 200 MG 24 hr tablet Take 0.5 tablets (100 mg total) by mouth daily.  30 tablet  11  . Multiple Vitamins-Minerals (CENTRUM SILVER PO) Take 1 tablet by mouth daily.        Marland Kitchen omeprazole (PRILOSEC) 20 MG capsule Take 1 capsule (20 mg total) by mouth daily.  30 capsule  11  . Pancrelipase, Lip-Prot-Amyl, 5000 UNITS CPEP Take 3 capsules by mouth 3 (three) times daily.        . pravastatin (PRAVACHOL) 20 MG tablet Take 20 mg by mouth daily.        . Syringe/Needle, Disp, (B-D ECLIPSE SYRINGE) 30G X 1/2" 1 ML MISC as directed.          Allergies  Allergen Reactions  . Citalopram Hydrobromide     REACTION: nausea, dizzy, dry mouth  . Diphenhydramine Hcl     REACTION: unspecified  . Metoclopramide Hcl     REACTION: unspecified  . Morphine Sulfate     REACTION: IV administration caused itching  . Paroxetine     REACTION: hallucinations    Past Medical History  Diagnosis Date  . Anxiety   . CHF (congestive heart failure)   . Depression   . Diabetes mellitus   . GERD (gastroesophageal reflux disease)   . Hypertension   . Thyroid disease   . Osteoporosis   . Allergy   . Hyperlipidemia   . RLS (restless legs syndrome)   . Pancreatic disease     pancreatic failure  . Urinary incontinence   . Pancreatitis     Pancreatic resection/ open wound- [facility] 11/04  . Pneumonia     Pneumonia/ Emphysema 05/05  . Diplopia     Diplopia- ? TIA, Carotid negative 11/07    Past Surgical History  Procedure Date  . Cholecystectomy   . Breast lumpectomy     Lumpectomy right breast  . 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 Abdominal hysterectomy     Hysteroscopy/ [facility] & C ([facility]) 12/05  . Kyphosis surgery 11/05    No family history on file.  History   Social History  . Marital Status: Widowed    Spouse Name: N/A    Number of Children: 7  . Years of Education: N/A   Occupational  History  . retired- Veterinary surgeon    Social History Main Topics  . Smoking status: Never Smoker   . Smokeless tobacco: Never Used  . Alcohol Use: No  . Drug Use: No  . Sexually Active: Not on file   Other Topics Concern  . Not on file   Social History Narrative   Has [name], daughter, as health care POANot sure about DNR   Review of Systems Appetite is fine---off the megace Sleep is not great----not clear why    Objective:   Physical Exam  Constitutional: She appears well-developed and well-nourished. No distress.  Neck: Normal range of motion. Neck supple.  Cardiovascular: Normal rate, regular rhythm and normal heart sounds.  Exam reveals no gallop.   No murmur heard. Pulmonary/Chest: Effort normal and breath sounds normal. No respiratory distress. She has no wheezes. She has no rales.  Musculoskeletal: She exhibits no edema and no tenderness.  Lymphadenopathy:    She has no cervical adenopathy.  Psychiatric: She has a normal mood and affect. Her behavior is normal. Judgment and thought content normal.          Assessment & Plan:

## 2011-04-08 LAB — BASIC METABOLIC PANEL
BUN: 14 mg/dL (ref 6–23)
Chloride: 104 mEq/L (ref 96–112)
Glucose, Bld: 280 mg/dL — ABNORMAL HIGH (ref 70–99)
Potassium: 4.4 mEq/L (ref 3.5–5.1)

## 2011-04-12 ENCOUNTER — Encounter: Payer: Self-pay | Admitting: *Deleted

## 2011-04-18 ENCOUNTER — Other Ambulatory Visit: Payer: Self-pay | Admitting: *Deleted

## 2011-04-18 MED ORDER — METOPROLOL SUCCINATE ER 200 MG PO TB24: 100.0000 mg | ORAL_TABLET | Freq: Every day | ORAL | Status: DC

## 2011-04-18 NOTE — Telephone Encounter (Signed)
.  left message to have patient return my call.  

## 2011-04-18 NOTE — Telephone Encounter (Signed)
Cannot refill She got Rx for #30 x 1 earlier this month This is potentially dangerous, esp at her age and with her medical conditions. At most, it should only be used once in a while

## 2011-04-20 ENCOUNTER — Telehealth: Payer: Self-pay | Admitting: *Deleted

## 2011-04-20 NOTE — Telephone Encounter (Signed)
Pt's son states pt has had diarrhea several times a day x 2 months.  She has been taking lomitil, which she is out of and refill has been denied, but this doesn't seem to help- one day it does and one day it doesn't.  She is not having any significant pain with the diarrhea.  Please advise on what she should do.

## 2011-04-20 NOTE — Telephone Encounter (Signed)
It is much safer to try cholestyramine 1 packet with water once or twice a day That will bind up loose stools and is safe ---unlike the other med Okay #60 x 5

## 2011-04-20 NOTE — Telephone Encounter (Signed)
Spoke with the son about the refill and advised him on 03/28/11 her OV Dr. Alphonsus Sias printed out a rx and gave it to pt and son for #30 x1, he stated that pt is taking the med to get rid of the diarrhea and that she might end up back in the hospital. I also called [facility] and the last refill was 03/26/11 for 30tabs, we printed rx on 03/28/11 for 30 x1. Please advise.  (son wanted call back at [phone])

## 2011-04-21 ENCOUNTER — Other Ambulatory Visit: Payer: Self-pay | Admitting: *Deleted

## 2011-04-21 MED ORDER — HYOSCYAMINE SULFATE 0.125 MG PO TABS: 0.1250 mg | ORAL_TABLET | Freq: Three times a day (TID) | ORAL | Status: DC

## 2011-04-21 NOTE — Telephone Encounter (Signed)
Spoke with patient and she states she can't take "liquid medications" is there something in a pill she can take? please advise

## 2011-04-21 NOTE — Telephone Encounter (Signed)
Spoke with patient and advised results rx sent to pharmacy by e-script  

## 2011-04-21 NOTE — Telephone Encounter (Signed)
We are going to try the cholestyramine or levsin

## 2011-04-21 NOTE — Telephone Encounter (Signed)
It is a powder that dissolves in water If that is not okay, can try levsin 0.125mg  tid before meals #90 x 1

## 2011-04-22 ENCOUNTER — Other Ambulatory Visit: Payer: Self-pay | Admitting: *Deleted

## 2011-04-22 NOTE — Telephone Encounter (Signed)
Form on your desk, stating that rx for Hyoscyamine is not covered by patient's insurance or medicaid, patient cost $101.45, please advise alternative.

## 2011-04-23 ENCOUNTER — Encounter: Payer: Self-pay | Admitting: Nurse Practitioner

## 2011-04-23 ENCOUNTER — Encounter: Payer: Self-pay | Admitting: Cardiothoracic Surgery

## 2011-04-24 NOTE — Telephone Encounter (Signed)
Please check with pharmacist about cost This is generic and I thought this is very inexpensive

## 2011-04-26 ENCOUNTER — Other Ambulatory Visit: Payer: Self-pay | Admitting: *Deleted

## 2011-04-26 MED ORDER — ALPRAZOLAM 1 MG PO TABS: 1.0000 mg | ORAL_TABLET | Freq: Three times a day (TID) | ORAL | Status: DC | PRN

## 2011-04-26 MED ORDER — METOPROLOL SUCCINATE ER 200 MG PO TB24: 200.0000 mg | ORAL_TABLET | Freq: Every day | ORAL | Status: DC

## 2011-04-26 MED ORDER — HYDROCODONE-ACETAMINOPHEN 5-500 MG PO CAPS: ORAL_CAPSULE | ORAL | Status: DC

## 2011-04-26 NOTE — Telephone Encounter (Signed)
Spoke with pharmacist about Hyoscyamine ( capsule or tabs) not being covered, per the pharmacist pt has part D and Medicaid and they will not cover this it's not part of her plan. Per pt she can not take anything liquid, please advise.

## 2011-04-26 NOTE — Telephone Encounter (Signed)
And if it isn't affordable (which is hard to believe), her safest alternative is to take pepto bismol up to tid

## 2011-04-26 NOTE — Telephone Encounter (Signed)
Forms on your desk, also was Metoprolol increased from 1/2 tab to 1 full tab? I changed in med list already, fax on your desk stating this, please advise.

## 2011-04-26 NOTE — Telephone Encounter (Signed)
Okay metoprolol for  1 year Others #90 x 0 each

## 2011-04-27 ENCOUNTER — Telehealth: Payer: Self-pay | Admitting: *Deleted

## 2011-04-27 NOTE — Telephone Encounter (Signed)
Spoke with patient and advised results, patient will try the Pepto Bismol three times daily

## 2011-04-27 NOTE — Telephone Encounter (Signed)
[name] Abide, MD 04/26/2011 8:00 AM Signed  And if it isn't affordable (which is hard to believe), her safest alternative is to take pepto bismol up to tid [name] Abide, MD 04/24/2011 4:41 PM Signed  Please check with pharmacist about cost  This is generic and I thought this is very inexpensive Mervin Hack, CMA 04/22/2011 3:41 PM Signed  Form on your desk, stating that rx for Hyoscyamine is not covered by patient's insurance or medicaid, patient cost $101.45, please advise alternative.

## 2011-05-04 ENCOUNTER — Telehealth: Payer: Self-pay | Admitting: *Deleted

## 2011-05-04 NOTE — Telephone Encounter (Signed)
[name] from [facility] says that [name]'s BP has been up for a while now.  She took it twice on Monday and it was 168/102 and 154/94.  She then went back to check it again on Tuesday and it was 148/96.  Please advise.

## 2011-05-04 NOTE — Telephone Encounter (Signed)
She had recent increases in medications that may not have completely settled in yet If she doesn't have symptoms, I would not increase meds further---at least not for the next month or so

## 2011-05-04 NOTE — Telephone Encounter (Signed)
Left detailed message on Misty's voicemail, advised to call if any questions.

## 2011-05-09 ENCOUNTER — Telehealth: Payer: Self-pay | Admitting: *Deleted

## 2011-05-09 MED ORDER — LISINOPRIL 20 MG PO TABS: 20.0000 mg | ORAL_TABLET | Freq: Every day | ORAL | Status: DC

## 2011-05-09 NOTE — Telephone Encounter (Signed)
She should have an appt for a follow up with me next month---not for labs I need to recheck her and I can then do any labs needed while she is here

## 2011-05-09 NOTE — Telephone Encounter (Signed)
Pt has appt for lab draw here next month and home health nurse is asking for order so that she can draw labs at pt's home, instead of her coming here.  Please advise.

## 2011-05-10 NOTE — Telephone Encounter (Signed)
Spoke with hospice nurse Misty and advised results

## 2011-05-11 ENCOUNTER — Other Ambulatory Visit: Payer: Self-pay | Admitting: *Deleted

## 2011-05-11 MED ORDER — LISINOPRIL 20 MG PO TABS: 20.0000 mg | ORAL_TABLET | Freq: Every day | ORAL | Status: DC

## 2011-05-11 NOTE — Telephone Encounter (Signed)
rx sent to pharmacy by e-script  

## 2011-05-16 DIAGNOSIS — T8189XA Other complications of procedures, not elsewhere classified, initial encounter: Secondary | ICD-10-CM

## 2011-05-16 DIAGNOSIS — E119 Type 2 diabetes mellitus without complications: Secondary | ICD-10-CM

## 2011-05-16 DIAGNOSIS — I5033 Acute on chronic diastolic (congestive) heart failure: Secondary | ICD-10-CM

## 2011-05-23 ENCOUNTER — Other Ambulatory Visit: Payer: Self-pay | Admitting: *Deleted

## 2011-05-23 ENCOUNTER — Encounter: Payer: Self-pay | Admitting: Cardiothoracic Surgery

## 2011-05-23 ENCOUNTER — Encounter: Payer: Self-pay | Admitting: Nurse Practitioner

## 2011-05-23 NOTE — Telephone Encounter (Signed)
Last refill 04/26/11.  Rx in your IN box.

## 2011-05-24 MED ORDER — HYDROCODONE-ACETAMINOPHEN 5-500 MG PO CAPS: ORAL_CAPSULE | ORAL | Status: DC

## 2011-05-24 NOTE — Telephone Encounter (Signed)
rx faxed to pharmacy manually  

## 2011-05-24 NOTE — Telephone Encounter (Signed)
Okay #90 x 0 

## 2011-05-30 ENCOUNTER — Other Ambulatory Visit: Payer: Self-pay | Admitting: *Deleted

## 2011-05-30 MED ORDER — ALPRAZOLAM 1 MG PO TABS: 1.0000 mg | ORAL_TABLET | Freq: Three times a day (TID) | ORAL | Status: DC | PRN

## 2011-05-30 NOTE — Telephone Encounter (Signed)
Form on your desk  

## 2011-05-30 NOTE — Telephone Encounter (Signed)
Okay #90 x 0 

## 2011-05-30 NOTE — Telephone Encounter (Signed)
rx faxed to pharmacy manually  

## 2011-06-07 ENCOUNTER — Other Ambulatory Visit: Payer: Medicare Other

## 2011-06-20 ENCOUNTER — Ambulatory Visit (INDEPENDENT_AMBULATORY_CARE_PROVIDER_SITE_OTHER): Payer: Medicare Other | Admitting: Internal Medicine

## 2011-06-20 ENCOUNTER — Encounter: Payer: Self-pay | Admitting: Internal Medicine

## 2011-06-20 ENCOUNTER — Ambulatory Visit: Payer: Medicare Other | Admitting: Internal Medicine

## 2011-06-20 VITALS — BP 126/94 | HR 64 | Ht 60.0 in | Wt 165.0 lb

## 2011-06-20 DIAGNOSIS — I10 Essential (primary) hypertension: Secondary | ICD-10-CM

## 2011-06-20 DIAGNOSIS — I509 Heart failure, unspecified: Secondary | ICD-10-CM

## 2011-06-20 DIAGNOSIS — E119 Type 2 diabetes mellitus without complications: Secondary | ICD-10-CM

## 2011-06-20 DIAGNOSIS — F411 Generalized anxiety disorder: Secondary | ICD-10-CM

## 2011-06-20 DIAGNOSIS — I5032 Chronic diastolic (congestive) heart failure: Secondary | ICD-10-CM

## 2011-06-20 DIAGNOSIS — F329 Major depressive disorder, single episode, unspecified: Secondary | ICD-10-CM

## 2011-06-20 DIAGNOSIS — Z23 Encounter for immunization: Secondary | ICD-10-CM

## 2011-06-20 NOTE — Assessment & Plan Note (Signed)
Stable on buspar and xanax

## 2011-06-20 NOTE — Assessment & Plan Note (Signed)
BP Readings from Last 3 Encounters:  06/20/11 126/94  04/07/11 160/90  02/18/11 150/80   Overall better Will continue current meds

## 2011-06-20 NOTE — Assessment & Plan Note (Signed)
Mood has been fine

## 2011-06-20 NOTE — Progress Notes (Signed)
Subjective:    Patient ID: [name], female    DOB: Aug 08, 1929, 77 y.o.   MRN: [id number]  HPI Here with granddaughter  Tried increased lantus---went back down to 20units due to AM hypoglycemia Tests premeal and uses rapid acting prn----from 0-30 units  BP good at times-- 126/76 this AM by nurse today No headaches No chest pain No SOB  Has clear rhinorrhea but no sig cough Not sick Not taking anything for this  Stomach wound is okay---continues at wound clinic  No GI problems Heartburn is controlled Bowels okay  Current Outpatient Prescriptions on File Prior to Visit  Medication Sig Dispense Refill  . ALPRAZolam (XANAX) 1 MG tablet Take 1 tablet (1 mg total) by mouth 3 (three) times daily as needed.  90 tablet  0  . aspirin 81 MG tablet Take 81 mg by mouth daily.        . busPIRone (BUSPAR) 10 MG tablet Take 10 mg by mouth 2 (two) times daily.        . carbidopa-levodopa (SINEMET) 25-100 MG per tablet Take 1 tablet by mouth 3 (three) times daily.  90 tablet  6  . furosemide (LASIX) 20 MG tablet Take 1 tablet (20 mg total) by mouth daily.  30 tablet  11  . Glucosamine-Chondroitin (OSTEO BI-FLEX REGULAR STRENGTH PO) Take 1 tablet by mouth daily.        . hydrocodone-acetaminophen (LORCET-HD) 5-500 MG per capsule Take 1 tab by mouth up to 3 times a day as needed.  90 capsule    . hyoscyamine (LEVSIN, ANASPAZ) 0.125 MG tablet Take 1 tablet (0.125 mg total) by mouth 3 (three) times daily before meals.  90 tablet  1  . insulin glargine (LANTUS) 100 UNIT/ML injection Inject 20 Units into the skin at bedtime.        . insulin regular (NOVOLIN R) 100 UNIT/ML injection Inject into the skin as directed.        Marland Kitchen levothyroxine (SYNTHROID, LEVOTHROID) 75 MCG tablet Take 1 tablet (75 mcg total) by mouth daily.  30 tablet  11  . lisinopril (PRINIVIL,ZESTRIL) 20 MG tablet Take 1 tablet (20 mg total) by mouth daily.  30 tablet  11  . metoprolol (TOPROL-XL) 200 MG 24 hr tablet Take 1 tablet  (200 mg total) by mouth daily.  30 tablet  11  . Multiple Vitamins-Minerals (CENTRUM SILVER PO) Take 1 tablet by mouth daily.        Marland Kitchen omeprazole (PRILOSEC) 20 MG capsule Take 1 capsule (20 mg total) by mouth daily.  30 capsule  11  . Pancrelipase, Lip-Prot-Amyl, 5000 UNITS CPEP Take 3 capsules by mouth 3 (three) times daily.        . pravastatin (PRAVACHOL) 20 MG tablet Take 20 mg by mouth daily.        . Syringe/Needle, Disp, (B-D ECLIPSE SYRINGE) 30G X 1/2" 1 ML MISC as directed.          Allergies  Allergen Reactions  . Citalopram Hydrobromide     REACTION: nausea, dizzy, dry mouth  . Diphenhydramine Hcl     REACTION: unspecified  . Metoclopramide Hcl     REACTION: unspecified  . Morphine Sulfate     REACTION: IV administration caused itching  . Paroxetine     REACTION: hallucinations    Past Medical History  Diagnosis Date  . Anxiety   . CHF (congestive heart failure)   . Depression   . Diabetes mellitus   . GERD (gastroesophageal  reflux disease)   . Hypertension   . Thyroid disease   . Osteoporosis   . Allergy   . Hyperlipidemia   . RLS (restless legs syndrome)   . Pancreatic disease     pancreatic failure  . Urinary incontinence   . Pancreatitis     Pancreatic resection/ open wound- [facility] 11/04  . Pneumonia     Pneumonia/ Emphysema 05/05  . Diplopia     Diplopia- ? TIA, Carotid negative 11/07    Past Surgical History  Procedure Date  . Cholecystectomy   . Breast lumpectomy     Lumpectomy right breast  . 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 Abdominal hysterectomy     Hysteroscopy/ D & C ([facility]) 12/05  . Kyphosis surgery 11/05    No family history on file.  History   Social History  . Marital Status: Widowed    Spouse Name: N/A    Number of Children: 7  . Years of Education: N/A   Occupational History  . retired- Veterinary surgeon    Social History Main Topics  . Smoking status: Never Smoker   .  Smokeless tobacco: Never Used  . Alcohol Use: No  . Drug Use: No  . Sexually Active: Not on file   Other Topics Concern  . Not on file   Social History Narrative   Has [name], daughter, as health care POANot sure about DNR   Review of Systems Sleeps well. Doesn't like getting up early for meds (thyroid and omeprazole?) Appetite is good Weight is stable    Objective:   Physical Exam  Constitutional: She appears well-developed and well-nourished. No distress.  Neck: Normal range of motion. Neck supple. No thyromegaly present.  Cardiovascular: Normal rate, regular rhythm, normal heart sounds and intact distal pulses.  Exam reveals no gallop.   No murmur heard. Pulmonary/Chest: Effort normal and breath sounds normal. No respiratory distress. She has no wheezes. She has no rales.  Musculoskeletal: She exhibits no edema and no tenderness.  Lymphadenopathy:    She has no cervical adenopathy.  Skin:       Mycotic toenails No foot ulcers  Psychiatric: She has a normal mood and affect. Her behavior is normal. Judgment and thought content normal.          Assessment & Plan:

## 2011-06-20 NOTE — Assessment & Plan Note (Signed)
Lab Results  Component Value Date   HGBA1C 9.6* 02/18/2011   Hopefully better control now Resistant to increasing insulin due to hypoglycemia (which it is vital to avoid) May need to increase coverage scale for rapid acting  Currently only takes 3 units for 200-300  5 units 300-400

## 2011-06-20 NOTE — Assessment & Plan Note (Addendum)
Weight is neutral No sig change in limited activity  Patient uses oxygen in the home

## 2011-06-23 ENCOUNTER — Encounter: Payer: Self-pay | Admitting: Cardiothoracic Surgery

## 2011-06-23 ENCOUNTER — Encounter: Payer: Self-pay | Admitting: Nurse Practitioner

## 2011-06-24 ENCOUNTER — Encounter: Payer: Self-pay | Admitting: *Deleted

## 2011-06-27 ENCOUNTER — Other Ambulatory Visit: Payer: Self-pay | Admitting: *Deleted

## 2011-06-28 MED ORDER — ALPRAZOLAM 1 MG PO TABS: 1.0000 mg | ORAL_TABLET | Freq: Three times a day (TID) | ORAL | Status: DC | PRN

## 2011-06-28 MED ORDER — HYDROCODONE-ACETAMINOPHEN 5-500 MG PO CAPS: ORAL_CAPSULE | ORAL | Status: DC

## 2011-06-28 NOTE — Telephone Encounter (Signed)
rx called into pharmacy

## 2011-06-28 NOTE — Telephone Encounter (Signed)
Okay #90 x 0 for each 

## 2011-07-01 ENCOUNTER — Telehealth: Payer: Self-pay | Admitting: Internal Medicine

## 2011-07-01 NOTE — Telephone Encounter (Signed)
Written consultation from [facility] pharmacist after home visit by case manager  BP 160/100 Recommended increasing lisinopril to 40mg  daily if still up (was lower at last visit) I have been reluctant to increase due to falls and concern for orthostasis-----they noted xanax as possibly being related to the falls also Suggested trying to wean which is worth trying  Not using ondansetron or levsin Removed from list

## 2011-07-04 ENCOUNTER — Other Ambulatory Visit: Payer: Self-pay | Admitting: *Deleted

## 2011-07-04 MED ORDER — PRAVASTATIN SODIUM 20 MG PO TABS: 20.0000 mg | ORAL_TABLET | Freq: Every day | ORAL | Status: DC

## 2011-07-04 NOTE — Telephone Encounter (Signed)
Received faxed refill request from pharmacy. Refill sent to pharmacy electronically. 

## 2011-07-21 ENCOUNTER — Other Ambulatory Visit: Payer: Self-pay | Admitting: *Deleted

## 2011-07-21 MED ORDER — INSULIN REGULAR HUMAN 100 UNIT/ML IJ SOLN: 4.0000 [IU] | Freq: Three times a day (TID) | INTRAMUSCULAR | Status: DC

## 2011-07-21 MED ORDER — HYDROCODONE-ACETAMINOPHEN 5-500 MG PO CAPS: ORAL_CAPSULE | ORAL | Status: DC

## 2011-07-21 NOTE — Telephone Encounter (Signed)
rx called into pharmacy

## 2011-07-21 NOTE — Telephone Encounter (Signed)
Okay #90 x 0 A few days early but is due for by Monday so seems appropriate

## 2011-07-23 ENCOUNTER — Encounter: Payer: Self-pay | Admitting: Nurse Practitioner

## 2011-07-23 ENCOUNTER — Encounter: Payer: Self-pay | Admitting: Cardiothoracic Surgery

## 2011-07-25 ENCOUNTER — Other Ambulatory Visit: Payer: Self-pay | Admitting: *Deleted

## 2011-07-25 MED ORDER — ALPRAZOLAM 1 MG PO TABS: 0.5000 mg | ORAL_TABLET | Freq: Two times a day (BID) | ORAL | Status: DC

## 2011-07-25 NOTE — Telephone Encounter (Signed)
Received faxed refill request from pharmacy. Is it okay to refill medication? 

## 2011-07-25 NOTE — Telephone Encounter (Signed)
Please let her know that I really think it is vital that she take less of these I am going to reduce the Rx to #75 per month She can take bid and use 1/2 tabs to try to reduce her dependence #75 x 0  I sent out the case managers from [facility] to try to help her with her overall health status but she wasn't cooperative with them. She may want to reconsider this  It is vital that we reduce the medications that can cause her harm

## 2011-07-25 NOTE — Telephone Encounter (Signed)
Spoke with patient and she states if she doesn't take this like in the past, she gets nervous and jittery, also pt states her daughter in-law told her that she already have enough people in and out her house so that's why she didn't cooperate with [facility].  Please advise on sig for alprazolam

## 2011-07-26 NOTE — Telephone Encounter (Signed)
She needs to at least try taking a lower dose This may be her biggest risk for falling and complications She should at least try taking less  Only #75 x 0

## 2011-08-08 ENCOUNTER — Other Ambulatory Visit: Payer: Self-pay | Admitting: *Deleted

## 2011-08-08 NOTE — Telephone Encounter (Signed)
It is too soon Even if she just kept to the former 4 times per day, it is still too early

## 2011-08-08 NOTE — Telephone Encounter (Signed)
Last filled 07/21/11, patient early?

## 2011-08-09 NOTE — Telephone Encounter (Signed)
Spoke with patient and advised results   

## 2011-08-09 NOTE — Telephone Encounter (Signed)
Spoke with patient and advised results, she will call next week for a refill. Also pt states she's having problems with her nerves, states she was taking xanax tid and now she been cut down to 1/2 tab bid, and she states that's not enough. I repeated the instructions from you on the 07/25/11 refill. Please advise

## 2011-08-09 NOTE — Telephone Encounter (Signed)
Okay to have her increase to 1/2 tab tid

## 2011-08-10 ENCOUNTER — Telehealth: Payer: Self-pay | Admitting: Internal Medicine

## 2011-08-10 NOTE — Telephone Encounter (Signed)
I already agreed to have it go back to 1/2 tid Okay to  can change rx to 1/2-1 tid prn but  have her try to decrease overall dose I had asked that she try to reduce to #75 per month at first instead of #90  (I didn't think I had decreased it to bid though)

## 2011-08-10 NOTE — Telephone Encounter (Signed)
[name] from [facility] and stated that 2 weeks ago her Xanax was decreased 3 times a day to 1/2 tablet to twice a day.  She states she is not doing well with this decrease because she has a few things going on with the family that is causing her anxiety and wanted to know if we could increase it back.   [name]:  [phone]

## 2011-08-10 NOTE — Telephone Encounter (Signed)
Left VM on Misty's cell phone, advised her to call if any questions

## 2011-08-11 ENCOUNTER — Telehealth: Payer: Self-pay | Admitting: Internal Medicine

## 2011-08-11 DIAGNOSIS — I5033 Acute on chronic diastolic (congestive) heart failure: Secondary | ICD-10-CM

## 2011-08-11 DIAGNOSIS — T8189XA Other complications of procedures, not elsewhere classified, initial encounter: Secondary | ICD-10-CM

## 2011-08-11 DIAGNOSIS — E119 Type 2 diabetes mellitus without complications: Secondary | ICD-10-CM

## 2011-08-11 NOTE — Telephone Encounter (Signed)
No, we really can't treat people over the phone I know Dr Dayton Martes is adding patients tomorrow morning if she wants to come in then

## 2011-08-11 NOTE — Telephone Encounter (Signed)
Spoke with patient and she states she will call in the morning depending on how she feels. I advised we may not have any available if she waits too long.

## 2011-08-11 NOTE — Telephone Encounter (Signed)
Patient called and stated that she would like for you to call in an antibiotic for congestion, cough because she doesn't feel like coming in.  I informed her that she would have to come in for an appointment but she wanted me to ask.

## 2011-08-17 ENCOUNTER — Other Ambulatory Visit: Payer: Self-pay | Admitting: *Deleted

## 2011-08-17 ENCOUNTER — Telehealth: Payer: Self-pay | Admitting: *Deleted

## 2011-08-17 MED ORDER — CARBIDOPA-LEVODOPA 25-100 MG PO TABS: 1.0000 | ORAL_TABLET | Freq: Three times a day (TID) | ORAL | Status: DC

## 2011-08-17 NOTE — Telephone Encounter (Signed)
Spoke with patient about all these forms we're getting asking for medical supplies, we get maybe 4-5 every other day. Per pt she have told them to stop calling her and to stop bothering her dr's office. I advised pt that I will fax the form back stating that she doesn't want any of the supplies and to stop call her.

## 2011-08-23 ENCOUNTER — Encounter: Payer: Self-pay | Admitting: Cardiothoracic Surgery

## 2011-08-23 ENCOUNTER — Encounter: Payer: Self-pay | Admitting: Nurse Practitioner

## 2011-08-26 ENCOUNTER — Other Ambulatory Visit: Payer: Self-pay | Admitting: *Deleted

## 2011-08-27 NOTE — Telephone Encounter (Signed)
Okay #90 x 0 

## 2011-08-29 LAB — WOUND CULTURE

## 2011-08-29 MED ORDER — HYDROCODONE-ACETAMINOPHEN 5-500 MG PO CAPS: ORAL_CAPSULE | ORAL | Status: DC

## 2011-08-29 NOTE — Telephone Encounter (Signed)
rx called into pharmacy

## 2011-09-19 ENCOUNTER — Other Ambulatory Visit: Payer: Self-pay | Admitting: *Deleted

## 2011-09-19 MED ORDER — ALPRAZOLAM 1 MG PO TABS: 0.5000 mg | ORAL_TABLET | Freq: Three times a day (TID) | ORAL | Status: DC | PRN

## 2011-09-19 NOTE — Telephone Encounter (Signed)
Okay #75 x 0 

## 2011-09-19 NOTE — Telephone Encounter (Signed)
rx called into pharmacy

## 2011-09-23 ENCOUNTER — Encounter: Payer: Self-pay | Admitting: Nurse Practitioner

## 2011-09-23 ENCOUNTER — Encounter: Payer: Self-pay | Admitting: Cardiothoracic Surgery

## 2011-09-28 ENCOUNTER — Other Ambulatory Visit: Payer: Self-pay | Admitting: *Deleted

## 2011-09-28 MED ORDER — BUSPIRONE HCL 10 MG PO TABS: 10.0000 mg | ORAL_TABLET | Freq: Two times a day (BID) | ORAL | Status: DC

## 2011-10-03 ENCOUNTER — Other Ambulatory Visit: Payer: Self-pay | Admitting: *Deleted

## 2011-10-03 MED ORDER — HYDROCODONE-ACETAMINOPHEN 5-500 MG PO CAPS: ORAL_CAPSULE | ORAL | Status: DC

## 2011-10-03 NOTE — Telephone Encounter (Signed)
rx called into pharmacy

## 2011-10-03 NOTE — Telephone Encounter (Signed)
Okay #90 x 0 

## 2011-10-03 NOTE — Telephone Encounter (Signed)
Received refill request from pharmacy. Is it okay to refill medication? 

## 2011-10-04 DIAGNOSIS — I5033 Acute on chronic diastolic (congestive) heart failure: Secondary | ICD-10-CM

## 2011-10-04 DIAGNOSIS — E119 Type 2 diabetes mellitus without complications: Secondary | ICD-10-CM

## 2011-10-04 DIAGNOSIS — T8189XA Other complications of procedures, not elsewhere classified, initial encounter: Secondary | ICD-10-CM

## 2011-10-11 ENCOUNTER — Other Ambulatory Visit: Payer: Self-pay

## 2011-10-12 ENCOUNTER — Telehealth: Payer: Self-pay

## 2011-10-12 NOTE — Telephone Encounter (Signed)
Received fax form for medical necessity for knee brace and back brace. Spoke with patient, she advised me that she did not feel that she needed the braces and does not want [place] to fill out the forms. She does have an upcoming appointment to see Dr. Alphonsus Sias.

## 2011-10-12 NOTE — Telephone Encounter (Signed)
Per daughter in-law pt never should be getting these forms filled out. Forms have been faxed back denied.

## 2011-10-14 ENCOUNTER — Ambulatory Visit: Payer: Medicare Other | Admitting: Internal Medicine

## 2011-10-21 ENCOUNTER — Encounter: Payer: Self-pay | Admitting: Cardiothoracic Surgery

## 2011-10-21 ENCOUNTER — Encounter: Payer: Self-pay | Admitting: Nurse Practitioner

## 2011-10-24 ENCOUNTER — Other Ambulatory Visit: Payer: Self-pay | Admitting: *Deleted

## 2011-10-24 MED ORDER — ALPRAZOLAM 1 MG PO TABS: 0.5000 mg | ORAL_TABLET | Freq: Three times a day (TID) | ORAL | Status: DC | PRN

## 2011-10-24 NOTE — Telephone Encounter (Signed)
rx called into pharmacy

## 2011-10-24 NOTE — Telephone Encounter (Signed)
Okay #75 x 0 

## 2011-10-27 ENCOUNTER — Encounter: Payer: Self-pay | Admitting: Internal Medicine

## 2011-10-27 ENCOUNTER — Ambulatory Visit (INDEPENDENT_AMBULATORY_CARE_PROVIDER_SITE_OTHER): Payer: Medicare Other | Admitting: Internal Medicine

## 2011-10-27 VITALS — BP 162/100 | HR 73 | Temp 98.2°F | Ht 60.0 in | Wt 168.0 lb

## 2011-10-27 DIAGNOSIS — I5032 Chronic diastolic (congestive) heart failure: Secondary | ICD-10-CM

## 2011-10-27 DIAGNOSIS — G2581 Restless legs syndrome: Secondary | ICD-10-CM

## 2011-10-27 DIAGNOSIS — I509 Heart failure, unspecified: Secondary | ICD-10-CM

## 2011-10-27 DIAGNOSIS — F411 Generalized anxiety disorder: Secondary | ICD-10-CM

## 2011-10-27 DIAGNOSIS — I10 Essential (primary) hypertension: Secondary | ICD-10-CM

## 2011-10-27 DIAGNOSIS — E119 Type 2 diabetes mellitus without complications: Secondary | ICD-10-CM

## 2011-10-27 NOTE — Assessment & Plan Note (Signed)
Has cut back on the alprazolam some

## 2011-10-27 NOTE — Assessment & Plan Note (Signed)
Lab Results  Component Value Date   HGBA1C 7.7* 06/20/2011   Better last time but had to decrease lantus due to hypoglycemia Will recheck now

## 2011-10-27 NOTE — Assessment & Plan Note (Signed)
BP Readings from Last 3 Encounters:  10/27/11 162/100  06/20/11 126/94  04/07/11 160/90   Stressed out now after dealing with front desk and issues of medicaid requiring different doctor

## 2011-10-27 NOTE — Assessment & Plan Note (Signed)
Ongoing problems with this Discussed holding daytime xanax and using at night

## 2011-10-27 NOTE — Progress Notes (Signed)
Subjective:    Patient ID: [name], female    DOB: April 06, 1929, 78 y.o.   MRN: [id number]  HPI Having some difficulty with Medicaid Now being told she needs to see practice in [place] No longer paying for xanax Has decreased the dose to #75 per month but now having trouble sleeping due to "restless feet"  Checks sugars tid Adjusts dose of regular insulin if running high No sig hypoglycemic spells since cutting lantus to 20 units---had been waking daily with low reactions (when on 30 units)  Had spell of chest pain she relates to heartburn Took tums and it helped SOme DOE if she "walks a good ways"---no sig change No ankle edema  Not really depressed Has aides helping with her---so not as lonely  Current Outpatient Prescriptions on File Prior to Visit  Medication Sig Dispense Refill  . ALPRAZolam (XANAX) 1 MG tablet Take 0.5 tablets (0.5 mg total) by mouth 3 (three) times daily as needed.  75 tablet  0  . aspirin 81 MG tablet Take 81 mg by mouth daily.        . busPIRone (BUSPAR) 10 MG tablet Take 1 tablet (10 mg total) by mouth 2 (two) times daily.  60 tablet  11  . carbidopa-levodopa (SINEMET) 25-100 MG per tablet Take 1 tablet by mouth 3 (three) times daily.  90 tablet  6  . furosemide (LASIX) 20 MG tablet Take 1 tablet (20 mg total) by mouth daily.  30 tablet  11  . Glucosamine-Chondroitin (OSTEO BI-FLEX REGULAR STRENGTH PO) Take 1 tablet by mouth daily.        . hydrocodone-acetaminophen (LORCET-HD) 5-500 MG per capsule Take 1 tab by mouth up to 3 times a day as needed.  90 capsule  0  . insulin glargine (LANTUS) 100 UNIT/ML injection Inject 20 Units into the skin at bedtime.        . insulin regular (NOVOLIN R) 100 units/mL injection Inject 0.04-0.08 mLs (4-8 Units total) into the skin 3 (three) times daily before meals.  10 mL  11  . levothyroxine (SYNTHROID, LEVOTHROID) 75 MCG tablet Take 1 tablet (75 mcg total) by mouth daily.  30 tablet  11  . lisinopril  (PRINIVIL,ZESTRIL) 20 MG tablet Take 1 tablet (20 mg total) by mouth daily.  30 tablet  11  . metoprolol (TOPROL-XL) 200 MG 24 hr tablet Take 1 tablet (200 mg total) by mouth daily.  30 tablet  11  . Multiple Vitamins-Minerals (CENTRUM SILVER PO) Take 1 tablet by mouth daily.        Marland Kitchen omeprazole (PRILOSEC) 20 MG capsule Take 1 capsule (20 mg total) by mouth daily.  30 capsule  11  . Pancrelipase, Lip-Prot-Amyl, 5000 UNITS CPEP Take 3 capsules by mouth 3 (three) times daily.        . pravastatin (PRAVACHOL) 20 MG tablet Take 1 tablet (20 mg total) by mouth daily.  30 tablet  7  . Syringe/Needle, Disp, (B-D ECLIPSE SYRINGE) 30G X 1/2" 1 ML MISC as directed.          Allergies  Allergen Reactions  . Citalopram Hydrobromide     REACTION: nausea, dizzy, dry mouth  . Diphenhydramine Hcl     REACTION: unspecified  . Metoclopramide Hcl     REACTION: unspecified  . Morphine Sulfate     REACTION: IV administration caused itching  . Paroxetine     REACTION: hallucinations    Past Medical History  Diagnosis Date  . Anxiety   .  CHF (congestive heart failure)   . Depression   . Diabetes mellitus   . GERD (gastroesophageal reflux disease)   . Hypertension   . Thyroid disease   . Osteoporosis   . Allergy   . Hyperlipidemia   . RLS (restless legs syndrome)   . Pancreatic disease     pancreatic failure  . Urinary incontinence   . Pancreatitis     Pancreatic resection/ open wound- [facility] 11/04  . Pneumonia     Pneumonia/ Emphysema 05/05  . Diplopia     Diplopia- ? TIA, Carotid negative 11/07    Past Surgical History  Procedure Date  . Cholecystectomy   . Breast lumpectomy     Lumpectomy right breast  . 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 Abdominal hysterectomy     Hysteroscopy/ D & C ([facility]) 12/05  . Kyphosis surgery 11/05    No family history on file.  History   Social History  . Marital Status: Widowed    Spouse Name:  N/A    Number of Children: 7  . Years of Education: N/A   Occupational History  . retired- Veterinary surgeon    Social History Main Topics  . Smoking status: Never Smoker   . Smokeless tobacco: Never Used  . Alcohol Use: No  . Drug Use: No  . Sexually Active: Not on file   Other Topics Concern  . Not on file   Social History Narrative   Has [name], daughter, as health care POANot sure about DNR   Review of Systems Not sleeping well---2-3 hours then needs to get up due to restless feelings Appetite is better--took left over megace again for a while Weight up a few pounds Bowels are okay    Objective:   Physical Exam  Constitutional: She appears well-developed and well-nourished. No distress.  Neck: Normal range of motion. Neck supple.  Cardiovascular: Normal rate, regular rhythm and normal heart sounds.  Exam reveals no gallop.   No murmur heard.      Faint pedal pulses  Pulmonary/Chest: Effort normal and breath sounds normal. No respiratory distress. She has no wheezes. She has no rales.  Abdominal: Soft. There is no tenderness.  Musculoskeletal: She exhibits no edema and no tenderness.  Lymphadenopathy:    She has no cervical adenopathy.  Skin: No rash noted.       Mycotic toenails Slight plantar callous  Psychiatric:       Mildly anxious Appropriate affect          Assessment & Plan:

## 2011-10-27 NOTE — Assessment & Plan Note (Signed)
Stable NYHA class 2 (it seems) Neutral fluid status Heart rate okay No changes

## 2011-10-28 ENCOUNTER — Ambulatory Visit: Payer: Medicare Other | Admitting: Internal Medicine

## 2011-10-28 LAB — HEMOGLOBIN A1C: Hgb A1c MFr Bld: 10.1 % — ABNORMAL HIGH (ref 4.6–6.5)

## 2011-11-01 ENCOUNTER — Telehealth: Payer: Self-pay | Admitting: Internal Medicine

## 2011-11-01 NOTE — Telephone Encounter (Signed)
[facility] referral completed.  Spoke w/ Marcelino Duster, gave referral information..Apolinar Junes

## 2011-11-03 NOTE — Telephone Encounter (Signed)
Great!

## 2011-11-07 ENCOUNTER — Telehealth: Payer: Self-pay | Admitting: *Deleted

## 2011-11-07 NOTE — Telephone Encounter (Signed)
Okay  Please pass this on to [facility]

## 2011-11-07 NOTE — Telephone Encounter (Signed)
Patients daughter in law Nutritional therapist) called stating that she no longer wants a nurse from [facility] to come out to see Kaitlyne.  She stated that home health ([facility]) already comes out to assist her mother in law and she feels that the more people that are involved the more [place] gets confused.  She wished for Dr. Alphonsus Sias to let [facility] know that their services are not needed.  Please advise.

## 2011-11-11 ENCOUNTER — Telehealth: Payer: Self-pay

## 2011-11-11 NOTE — Telephone Encounter (Signed)
[name] left v/m the she is returning [name]'s call from Wednesday clarifying insulin dosage. [name] said she has not heard if [name] wanted to change doses. [name] can be reached at [phone].

## 2011-11-11 NOTE — Telephone Encounter (Signed)
Left message with results on daughter in-law VM, advised her to call if any questions See results note dated 10/28/11

## 2011-11-14 ENCOUNTER — Other Ambulatory Visit: Payer: Self-pay

## 2011-11-14 MED ORDER — HYDROCODONE-ACETAMINOPHEN 5-500 MG PO CAPS: ORAL_CAPSULE | ORAL | Status: DC

## 2011-11-14 NOTE — Telephone Encounter (Signed)
[facility] faxed refill request for hydrocodone apap 5-500 mg. Pt last seen 10/27/11 and med last refilled 10/03/11.Please advise.

## 2011-11-14 NOTE — Telephone Encounter (Signed)
Okay #90 x 0 

## 2011-11-14 NOTE — Telephone Encounter (Signed)
rx called into pharmacy

## 2011-11-20 LAB — WOUND CULTURE

## 2011-11-21 ENCOUNTER — Encounter: Payer: Self-pay | Admitting: Nurse Practitioner

## 2011-11-21 ENCOUNTER — Encounter: Payer: Self-pay | Admitting: Cardiothoracic Surgery

## 2011-11-24 ENCOUNTER — Other Ambulatory Visit: Payer: Self-pay | Admitting: *Deleted

## 2011-11-24 ENCOUNTER — Telehealth: Payer: Self-pay | Admitting: *Deleted

## 2011-11-24 MED ORDER — INSULIN GLARGINE 100 UNIT/ML ~~LOC~~ SOLN: 20.0000 [IU] | Freq: Every day | SUBCUTANEOUS | Status: DC

## 2011-11-24 NOTE — Telephone Encounter (Signed)
Spoke with Vernona Rieger and advised results

## 2011-11-24 NOTE — Telephone Encounter (Signed)
[name] from [facility] called stating that Rx for Lantus was sent in today for 20 units at bedtime but patient has been on 15 units.  They want to know if this is a dose change or a mistake.  Please advise.

## 2011-11-24 NOTE — Telephone Encounter (Signed)
We have been increasing her dose due to inadequate control I expect it to increase further

## 2011-12-01 DIAGNOSIS — T8189XA Other complications of procedures, not elsewhere classified, initial encounter: Secondary | ICD-10-CM

## 2011-12-01 DIAGNOSIS — I5033 Acute on chronic diastolic (congestive) heart failure: Secondary | ICD-10-CM

## 2011-12-01 DIAGNOSIS — E119 Type 2 diabetes mellitus without complications: Secondary | ICD-10-CM

## 2011-12-21 ENCOUNTER — Encounter: Payer: Self-pay | Admitting: Nurse Practitioner

## 2011-12-21 ENCOUNTER — Encounter: Payer: Self-pay | Admitting: Cardiothoracic Surgery

## 2011-12-21 ENCOUNTER — Other Ambulatory Visit: Payer: Self-pay | Admitting: *Deleted

## 2011-12-21 MED ORDER — ONDANSETRON HCL 4 MG PO TABS: 4.0000 mg | ORAL_TABLET | Freq: Three times a day (TID) | ORAL | Status: DC | PRN

## 2011-12-21 NOTE — Telephone Encounter (Signed)
Okay to refill #30 x 0 for occasional use

## 2011-12-21 NOTE — Telephone Encounter (Signed)
rx sent to pharmacy by e-script  

## 2011-12-21 NOTE — Telephone Encounter (Signed)
Faxed request asking for ONDANSETRON 4MG  take 1 tab every 8 hours prn nausea, last rx'd 01/03/11 #30 , please advise not on med list.

## 2012-01-17 ENCOUNTER — Other Ambulatory Visit: Payer: Self-pay | Admitting: *Deleted

## 2012-01-17 DIAGNOSIS — I5033 Acute on chronic diastolic (congestive) heart failure: Secondary | ICD-10-CM

## 2012-01-17 DIAGNOSIS — T8189XA Other complications of procedures, not elsewhere classified, initial encounter: Secondary | ICD-10-CM

## 2012-01-17 DIAGNOSIS — E119 Type 2 diabetes mellitus without complications: Secondary | ICD-10-CM

## 2012-01-17 MED ORDER — HYDROCODONE-ACETAMINOPHEN 5-500 MG PO CAPS: ORAL_CAPSULE | ORAL | Status: DC

## 2012-01-17 MED ORDER — PANCRELIPASE (LIP-PROT-AMYL) 5000 UNITS PO CPEP: 3.0000 | ORAL_CAPSULE | Freq: Three times a day (TID) | ORAL | Status: DC

## 2012-01-17 MED ORDER — ALPRAZOLAM 1 MG PO TABS: 0.5000 mg | ORAL_TABLET | Freq: Three times a day (TID) | ORAL | Status: DC | PRN

## 2012-01-17 NOTE — Telephone Encounter (Signed)
rx called into pharmacy

## 2012-01-17 NOTE — Telephone Encounter (Signed)
Okay alprazolam #75 x 0 Hydrocodone #90 x 0 pancrealipase for a year

## 2012-01-21 ENCOUNTER — Encounter: Payer: Self-pay | Admitting: Nurse Practitioner

## 2012-01-21 ENCOUNTER — Encounter: Payer: Self-pay | Admitting: Cardiothoracic Surgery

## 2012-01-30 ENCOUNTER — Other Ambulatory Visit: Payer: Self-pay | Admitting: *Deleted

## 2012-01-30 MED ORDER — LEVOTHYROXINE SODIUM 75 MCG PO TABS: 75.0000 ug | ORAL_TABLET | Freq: Every day | ORAL | Status: DC

## 2012-02-06 ENCOUNTER — Other Ambulatory Visit: Payer: Self-pay | Admitting: *Deleted

## 2012-02-06 NOTE — Telephone Encounter (Signed)
Too soon Was filled 5/28 Find out what is going on

## 2012-02-07 ENCOUNTER — Telehealth: Payer: Self-pay | Admitting: Internal Medicine

## 2012-02-07 NOTE — Telephone Encounter (Signed)
Caller: Angela/Other; Phone Number: [phone]; Message from caller: [name] is returning a call for a nurse in regards to patients medications.  She counted what she had and she will be fine until the end of the month when her prescripton is ready to be refilled.  You can reach her back at [phone] for any questions.

## 2012-02-07 NOTE — Telephone Encounter (Signed)
Spoke with patient and she states she only takes Norco bid, and I advised we prescribe for tid and she should have enough to last. I also left message for daughter in law to check pt's Norco to see if she has enough, per pt "I hope I have enough to last until next week".

## 2012-02-07 NOTE — Telephone Encounter (Signed)
Thanks ( see refill note  02/06/12)

## 2012-02-12 DIAGNOSIS — T8189XA Other complications of procedures, not elsewhere classified, initial encounter: Secondary | ICD-10-CM

## 2012-02-12 DIAGNOSIS — I10 Essential (primary) hypertension: Secondary | ICD-10-CM

## 2012-02-12 DIAGNOSIS — E119 Type 2 diabetes mellitus without complications: Secondary | ICD-10-CM

## 2012-02-16 ENCOUNTER — Other Ambulatory Visit: Payer: Self-pay | Admitting: *Deleted

## 2012-02-17 MED ORDER — HYDROCODONE-ACETAMINOPHEN 5-500 MG PO CAPS: ORAL_CAPSULE | ORAL | Status: DC

## 2012-02-17 NOTE — Telephone Encounter (Signed)
rx called into pharmacy

## 2012-02-17 NOTE — Telephone Encounter (Signed)
Okay #90 x 0 

## 2012-02-20 ENCOUNTER — Telehealth: Payer: Self-pay | Admitting: Internal Medicine

## 2012-02-20 ENCOUNTER — Encounter: Payer: Self-pay | Admitting: Nurse Practitioner

## 2012-02-20 ENCOUNTER — Encounter: Payer: Self-pay | Admitting: Cardiothoracic Surgery

## 2012-02-20 NOTE — Telephone Encounter (Signed)
Caller: Suzan Garibaldi; PCP: Tillman Abide; CB#: [phone]; Call regarding Hypertension;  Mardella Layman, RN (home health with [facility] calling with an update on pt BP and BS. Pt's BP at 1300 was 180/88 -same at recheck when RN was leaving. BS was 325 at fasting and 220 when RN checked it at 1300. Pt states it's like that all the time. Used SS Insulin to trx. States pt was completely asymptomatic, tolerated walking with RN with no SOB, dizziness or visual changes. Called pt to [phone]. Pt states she "Feels fine" and denies any sxs. Only c/o is that she has had some "head conjestion" for "about a month now". Pt has been taking OTC Guaifenesin. States when she doesn't take it she has "spots" in vision. None now. Disp: See provider w/in 24 hrs per Hypertension, Diagnosed or Suspected protocol. Spoke with son, Iantha Fallen, at [phone], and set up an appt for pt for 7/2 at 1415. No appt's with Dr. Alphonsus Sias and they could not get pt here before 1300. Relayed to pt and gave her (and son) strict call back parameters if any change in sxs before 7/2.

## 2012-02-21 ENCOUNTER — Other Ambulatory Visit: Payer: Self-pay | Admitting: *Deleted

## 2012-02-21 ENCOUNTER — Ambulatory Visit (INDEPENDENT_AMBULATORY_CARE_PROVIDER_SITE_OTHER): Payer: Medicare Other | Admitting: Family Medicine

## 2012-02-21 ENCOUNTER — Encounter: Payer: Self-pay | Admitting: Family Medicine

## 2012-02-21 VITALS — BP 140/80 | HR 77 | Temp 98.2°F | Ht 59.0 in | Wt 170.0 lb

## 2012-02-21 DIAGNOSIS — J019 Acute sinusitis, unspecified: Secondary | ICD-10-CM

## 2012-02-21 DIAGNOSIS — I10 Essential (primary) hypertension: Secondary | ICD-10-CM

## 2012-02-21 DIAGNOSIS — B9689 Other specified bacterial agents as the cause of diseases classified elsewhere: Secondary | ICD-10-CM | POA: Insufficient documentation

## 2012-02-21 MED ORDER — PRAVASTATIN SODIUM 20 MG PO TABS: 20.0000 mg | ORAL_TABLET | Freq: Every day | ORAL | Status: DC

## 2012-02-21 MED ORDER — AMOXICILLIN 500 MG PO CAPS: 500.0000 mg | ORAL_CAPSULE | Freq: Three times a day (TID) | ORAL | Status: DC

## 2012-02-21 MED ORDER — HYDROCODONE-ACETAMINOPHEN 5-500 MG PO CAPS: ORAL_CAPSULE | ORAL | Status: DC

## 2012-02-21 NOTE — Assessment & Plan Note (Signed)
With headache and congestion on and off since the holidays and tender maxillary sinuses Will tx with amox Disc symptomatic care - see instructions on AVS  Update if not starting to improve in a week or if worsening

## 2012-02-21 NOTE — Patient Instructions (Addendum)
For sinus infection try nasal saline spray in nose  Also the mucinex is ok  Take the amoxicillin for sinus infection  Update if not starting to improve in a week or if worsening   Follow up with [name] as planned  Blood pressrue today is improved

## 2012-02-21 NOTE — Assessment & Plan Note (Signed)
bp is improved today- pt is re assured by this  She seems mildly anxious today

## 2012-02-21 NOTE — Telephone Encounter (Signed)
Faxed refill request from [facility], last filled 75 on 01/17/12.

## 2012-02-21 NOTE — Telephone Encounter (Signed)
Will await that evaluation Compliance with best recommendations has generally been limited in the past so we try to do the best we can

## 2012-02-21 NOTE — Progress Notes (Signed)
Subjective:    Patient ID: [name], female    DOB: [birth year], 78 y.o.   MRN: [id number]  HPI  Thinks she has a sinus infection  Has had symptoms on and off since the holidays  Has nasal congestion  Mostly clear discharge- occ colored No cough  No fever  Headache - frontal and feels tight around eyes - very uncomfortable, bilateral   Sugar is 217 today-that is high for her (non fasting)  No other symptoms - bp is actually better today   Has appt with [name] for regular f/u in 2 weeks  Was worried about her bp due to the headache- but it is improved today BP Readings from Last 3 Encounters:  02/21/12 140/80  10/27/11 162/100  06/20/11 126/94   quite anxious at times as well  Patient Active Problem List  Diagnosis  . HYPOTHYROIDISM  . DIABETES MELLITUS, TYPE II  . HYPERLIPIDEMIA  . THROMBOCYTOPENIA  . ANXIETY  . DEPRESSION  . RESTLESS LEG SYNDROME  . HYPERTENSION  . FAILURE, DIASTOLIC HEART, CHRONIC  . ALLERGIC RHINITIS  . GERD  . URINARY INCONTINENCE  . WOUND DEHISCENCE, ABDOMINAL  . Acute bacterial sinusitis   Past Medical History  Diagnosis Date  . Anxiety   . CHF (congestive heart failure)   . Depression   . Diabetes mellitus   . GERD (gastroesophageal reflux disease)   . Hypertension   . Thyroid disease   . Osteoporosis   . Allergy   . Hyperlipidemia   . RLS (restless legs syndrome)   . Pancreatic disease     pancreatic failure  . Urinary incontinence   . Pancreatitis     Pancreatic resection/ open wound- [facility] 11/04  . Pneumonia     Pneumonia/ Emphysema 05/05  . Diplopia     Diplopia- ? TIA, Carotid negative 11/07   Past Surgical History  Procedure Date  . Cholecystectomy   . Breast lumpectomy     Lumpectomy right breast  . 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 Abdominal hysterectomy     Hysteroscopy/ D & C ([facility]) 12/05  . Kyphosis surgery 11/05   History  Substance Use Topics    . Smoking status: Never Smoker   . Smokeless tobacco: Never Used  . Alcohol Use: No   No family history on file. Allergies  Allergen Reactions  . Citalopram Hydrobromide     REACTION: nausea, dizzy, dry mouth  . Diphenhydramine Hcl     REACTION: unspecified  . Metoclopramide Hcl     REACTION: unspecified  . Morphine Sulfate     REACTION: IV administration caused itching  . Paroxetine     REACTION: hallucinations   Current Outpatient Prescriptions on File Prior to Visit  Medication Sig Dispense Refill  . aspirin 81 MG tablet Take 81 mg by mouth daily.        . busPIRone (BUSPAR) 10 MG tablet Take 1 tablet (10 mg total) by mouth 2 (two) times daily.  60 tablet  11  . carbidopa-levodopa (SINEMET) 25-100 MG per tablet Take 1 tablet by mouth 3 (three) times daily.  90 tablet  6  . furosemide (LASIX) 20 MG tablet Take 1 tablet (20 mg total) by mouth daily.  30 tablet  11  . Glucosamine-Chondroitin (OSTEO BI-FLEX REGULAR STRENGTH PO) Take 1 tablet by mouth daily.        . insulin glargine (LANTUS) 100 UNIT/ML injection Inject 20 Units into the skin  at bedtime.  10 mL  11  . insulin regular (NOVOLIN R) 100 units/mL injection Inject 0.04-0.08 mLs (4-8 Units total) into the skin 3 (three) times daily before meals.  10 mL  11  . levothyroxine (SYNTHROID, LEVOTHROID) 75 MCG tablet Take 1 tablet (75 mcg total) by mouth daily.  30 tablet  11  . lisinopril (PRINIVIL,ZESTRIL) 20 MG tablet Take 1 tablet (20 mg total) by mouth daily.  30 tablet  11  . megestrol (MEGACE ES) 625 MG/5ML suspension Take 625 mg by mouth daily.      . metoprolol (TOPROL-XL) 200 MG 24 hr tablet Take 1 tablet (200 mg total) by mouth daily.  30 tablet  11  . Multiple Vitamins-Minerals (CENTRUM SILVER PO) Take 1 tablet by mouth daily.        Marland Kitchen omeprazole (PRILOSEC) 20 MG capsule Take 1 capsule (20 mg total) by mouth daily.  30 capsule  11  . ondansetron (ZOFRAN) 4 MG tablet Take 1 tablet (4 mg total) by mouth every 8 (eight)  hours as needed.  30 tablet  0  . Pancrelipase, Lip-Prot-Amyl, 5000 UNITS CPEP Take 3 capsules (15,000 Units total) by mouth 3 (three) times daily.  270 capsule  3  . pravastatin (PRAVACHOL) 20 MG tablet Take 1 tablet (20 mg total) by mouth daily.  30 tablet  0  . Syringe/Needle, Disp, (B-D ECLIPSE SYRINGE) 30G X 1/2" 1 ML MISC as directed.              Review of Systems Review of Systems  Constitutional: Negative for fever, appetite change,  and unexpected weight change. pos for fatigue/ malaise ENT pos for congestion and sinus pressure and pain/ neg for st / pos for throat clearing with post nasal drip Eyes: Negative for pain and visual disturbance.  Respiratory: Negative for sob and wheeze, pos for dry cough Cardiovascular: Negative for cp or palpitations    Gastrointestinal: Negative for nausea, diarrhea and constipation.  Genitourinary: Negative for urgency and frequency.  Skin: Negative for pallor or rash   Neurological: Negative for weakness, light-headedness, numbness and headaches.  Hematological: Negative for adenopathy. Does not bruise/bleed easily.  Psychiatric/Behavioral: Negative for dysphoric mood. The patient is anxious          Objective:   Physical Exam  Constitutional: She appears well-developed and well-nourished. No distress.       Nervous elderly female in no distress  HENT:  Head: Normocephalic and atraumatic.  Right Ear: External ear normal.  Left Ear: External ear normal.  Mouth/Throat: Oropharynx is clear and moist. No oropharyngeal exudate.       Nares are injected and congested   Bilateral maxillary sinus tenderness Clear post nasal drip   Eyes: Conjunctivae and EOM are normal. Pupils are equal, round, and reactive to light. Right eye exhibits no discharge. Left eye exhibits no discharge.  Neck: Normal range of motion. Neck supple. No JVD present. No thyromegaly present.  Cardiovascular: Normal rate and regular rhythm.   Pulmonary/Chest: Effort  normal and breath sounds normal. No respiratory distress. She has no wheezes.  Abdominal: Soft. Bowel sounds are normal. She exhibits no distension and no mass. There is no tenderness.  Musculoskeletal: She exhibits no edema.  Lymphadenopathy:    She has no cervical adenopathy.  Neurological: She is alert. She has normal reflexes. No cranial nerve deficit.  Skin: Skin is warm and dry. No rash noted. No erythema. No pallor.  Psychiatric: She has a normal mood and affect.  Assessment & Plan:

## 2012-02-22 MED ORDER — ALPRAZOLAM 1 MG PO TABS: 0.5000 mg | ORAL_TABLET | Freq: Three times a day (TID) | ORAL | Status: DC | PRN

## 2012-02-22 NOTE — Telephone Encounter (Signed)
Okay #75 x 0

## 2012-02-22 NOTE — Telephone Encounter (Signed)
rx called into pharmacy

## 2012-02-27 ENCOUNTER — Encounter: Payer: Self-pay | Admitting: Internal Medicine

## 2012-02-27 ENCOUNTER — Ambulatory Visit (INDEPENDENT_AMBULATORY_CARE_PROVIDER_SITE_OTHER): Payer: Medicare Other | Admitting: Internal Medicine

## 2012-02-27 VITALS — BP 160/80 | HR 71 | Temp 98.4°F | Ht 59.0 in | Wt 169.0 lb

## 2012-02-27 DIAGNOSIS — IMO0001 Reserved for inherently not codable concepts without codable children: Secondary | ICD-10-CM

## 2012-02-27 DIAGNOSIS — E039 Hypothyroidism, unspecified: Secondary | ICD-10-CM

## 2012-02-27 DIAGNOSIS — F411 Generalized anxiety disorder: Secondary | ICD-10-CM

## 2012-02-27 DIAGNOSIS — E785 Hyperlipidemia, unspecified: Secondary | ICD-10-CM

## 2012-02-27 DIAGNOSIS — E119 Type 2 diabetes mellitus without complications: Secondary | ICD-10-CM

## 2012-02-27 DIAGNOSIS — IMO0002 Reserved for concepts with insufficient information to code with codable children: Secondary | ICD-10-CM | POA: Insufficient documentation

## 2012-02-27 DIAGNOSIS — I5032 Chronic diastolic (congestive) heart failure: Secondary | ICD-10-CM

## 2012-02-27 DIAGNOSIS — I10 Essential (primary) hypertension: Secondary | ICD-10-CM

## 2012-02-27 DIAGNOSIS — G2581 Restless legs syndrome: Secondary | ICD-10-CM

## 2012-02-27 DIAGNOSIS — M1710 Unilateral primary osteoarthritis, unspecified knee: Secondary | ICD-10-CM | POA: Insufficient documentation

## 2012-02-27 LAB — CBC WITH DIFFERENTIAL/PLATELET
Basophils Absolute: 0 10*3/uL (ref 0.0–0.1)
HCT: 40.6 % (ref 36.0–46.0)
Lymphs Abs: 2.2 10*3/uL (ref 0.7–4.0)
Monocytes Relative: 7.1 % (ref 3.0–12.0)
Platelets: 118 10*3/uL — ABNORMAL LOW (ref 150.0–400.0)
RDW: 13.7 % (ref 11.5–14.6)

## 2012-02-27 LAB — BASIC METABOLIC PANEL
BUN: 15 mg/dL (ref 6–23)
Calcium: 9 mg/dL (ref 8.4–10.5)
GFR: 70.2 mL/min (ref >60.00)
Glucose, Bld: 204 mg/dL — ABNORMAL HIGH (ref 70–99)
Potassium: 4 mEq/L (ref 3.5–5.1)

## 2012-02-27 LAB — HEPATIC FUNCTION PANEL
Albumin: 3.8 g/dL (ref 3.5–5.2)
Alkaline Phosphatase: 78 U/L (ref 39–117)

## 2012-02-27 LAB — LDL CHOLESTEROL, DIRECT: Direct LDL: 93.6 mg/dL

## 2012-02-27 LAB — TSH: TSH: 0.86 u[IU]/mL (ref 0.35–5.50)

## 2012-02-27 LAB — LIPID PANEL
Cholesterol: 175 mg/dL (ref 0–200)
HDL: 39.8 mg/dL (ref >39.00)
Triglycerides: 251 mg/dL — ABNORMAL HIGH (ref 0.0–149.0)
VLDL: 50.2 mg/dL — ABNORMAL HIGH (ref 0.0–40.0)

## 2012-02-27 MED ORDER — LISINOPRIL 20 MG PO TABS: 20.0000 mg | ORAL_TABLET | Freq: Two times a day (BID) | ORAL | Status: DC

## 2012-02-27 NOTE — Assessment & Plan Note (Signed)
Mood okay on buspar and xanax

## 2012-02-27 NOTE — Assessment & Plan Note (Signed)
Still on the sinemet

## 2012-02-27 NOTE — Patient Instructions (Signed)
You are not longer on lomotil (diphenoxylate) Please increase the lisinopril to 20mg  twice a day

## 2012-02-27 NOTE — Assessment & Plan Note (Signed)
Hopefully back to acceptable control again If still above 9%, will increase regular to 5 & 8 premeal

## 2012-02-27 NOTE — Assessment & Plan Note (Signed)
Shots in knee at [facility] Uses the hydrocodone also

## 2012-02-27 NOTE — Assessment & Plan Note (Signed)
BP Readings from Last 3 Encounters:  02/27/12 160/80  02/21/12 140/80  10/27/11 162/100   Will increase the lisinopril to 20 bid

## 2012-02-27 NOTE — Assessment & Plan Note (Signed)
Compensated with stable fluid status NYHA class 2 symptoms (or maybe slightly worse) Consider increasing the lisinopril

## 2012-02-27 NOTE — Progress Notes (Signed)
Subjective:    Patient ID: [name], female    DOB: 1928-12-23, 78 y.o.   MRN: [id number]  HPI Here with her son, Iantha Fallen  Doing better now Still uses the xanax and buspar for anxiety  Regular visits with [facility] still Checks BP and runs okay Headaches now gone with sinusitis Rx No chest pain No SOB  Ongoing pain issues Takes the hydrocodone up to tid  Checks sugars tid still Uses regular insulin 3 or 5 units mostly. Occ as much as 8 Seems to be better controlled now Did increase the lantus to 25 units No hypoglycemic reactions except when she was on 30 units of lantus  Current Outpatient Prescriptions on File Prior to Visit  Medication Sig Dispense Refill  . ALPRAZolam (XANAX) 1 MG tablet Take 0.5 tablets (0.5 mg total) by mouth 3 (three) times daily as needed.  75 tablet  0  . aspirin 81 MG tablet Take 81 mg by mouth daily.        . busPIRone (BUSPAR) 10 MG tablet Take 1 tablet (10 mg total) by mouth 2 (two) times daily.  60 tablet  11  . carbidopa-levodopa (SINEMET) 25-100 MG per tablet Take 1 tablet by mouth 3 (three) times daily.  90 tablet  6  . fluticasone (FLONASE) 50 MCG/ACT nasal spray Place 2 sprays into the nose daily.      . furosemide (LASIX) 20 MG tablet Take 1 tablet (20 mg total) by mouth daily.  30 tablet  11  . Glucosamine-Chondroitin (OSTEO BI-FLEX REGULAR STRENGTH PO) Take 1 tablet by mouth daily.        . hydrocodone-acetaminophen (LORCET-HD) 5-500 MG per capsule Take 1 tab by mouth up to 3 times a day as needed.  90 capsule  0  . levothyroxine (SYNTHROID, LEVOTHROID) 75 MCG tablet Take 1 tablet (75 mcg total) by mouth daily.  30 tablet  11  . lisinopril (PRINIVIL,ZESTRIL) 20 MG tablet Take 1 tablet (20 mg total) by mouth daily.  30 tablet  11  . loperamide (IMODIUM A-D) 2 MG tablet Take 2 mg by mouth 4 (four) times daily as needed. Non Formula Anti Diarrheal      . metoprolol (TOPROL-XL) 200 MG 24 hr tablet Take 1 tablet (200 mg total) by mouth  daily.  30 tablet  11  . Multiple Vitamins-Minerals (CENTRUM SILVER PO) Take 1 tablet by mouth daily.        Marland Kitchen omeprazole (PRILOSEC) 20 MG capsule Take 1 capsule (20 mg total) by mouth daily.  30 capsule  11  . ondansetron (ZOFRAN) 4 MG tablet Take 1 tablet (4 mg total) by mouth every 8 (eight) hours as needed.  30 tablet  0  . Pancrelipase, Lip-Prot-Amyl, 5000 UNITS CPEP Take 3 capsules (15,000 Units total) by mouth 3 (three) times daily.  270 capsule  3  . pravastatin (PRAVACHOL) 20 MG tablet Take 1 tablet (20 mg total) by mouth daily.  30 tablet  0  . Syringe/Needle, Disp, (B-D ECLIPSE SYRINGE) 30G X 1/2" 1 ML MISC as directed.        Marland Kitchen DISCONTD: insulin glargine (LANTUS) 100 UNIT/ML injection Inject 20 Units into the skin at bedtime.  10 mL  11  . DISCONTD: insulin regular (NOVOLIN R) 100 units/mL injection Inject 0.04-0.08 mLs (4-8 Units total) into the skin 3 (three) times daily before meals.  10 mL  11    Allergies  Allergen Reactions  . Citalopram Hydrobromide     REACTION: nausea,  dizzy, dry mouth  . Diphenhydramine Hcl     REACTION: unspecified  . Metoclopramide Hcl     REACTION: unspecified  . Morphine Sulfate     REACTION: IV administration caused itching  . Paroxetine     REACTION: hallucinations    Past Medical History  Diagnosis Date  . Anxiety   . CHF (congestive heart failure)   . Depression   . Diabetes mellitus   . GERD (gastroesophageal reflux disease)   . Hypertension   . Thyroid disease   . Osteoporosis   . Allergy   . Hyperlipidemia   . RLS (restless legs syndrome)   . Pancreatic disease     pancreatic failure  . Urinary incontinence   . Pancreatitis     Pancreatic resection/ open wound- [facility] 11/04  . Pneumonia     Pneumonia/ Emphysema 05/05  . Diplopia     Diplopia- ? TIA, Carotid negative 11/07    Past Surgical History  Procedure Date  . Cholecystectomy   . Breast lumpectomy     Lumpectomy right breast  . 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 Abdominal hysterectomy     Hysteroscopy/ D & C ([facility]) 12/05  . Kyphosis surgery 11/05    No family history on file.  History   Social History  . Marital Status: Widowed    Spouse Name: N/A    Number of Children: 7  . Years of Education: N/A   Occupational History  . retired- Veterinary surgeon    Social History Main Topics  . Smoking status: Never Smoker   . Smokeless tobacco: Never Used  . Alcohol Use: No  . Drug Use: No  . Sexually Active: Not on file   Other Topics Concern  . Not on file   Social History Narrative   Has [name], daughter, as health care POANot sure about DNR   Review of Systems Still gets regular loose stools. Uses immodium prn Sleeps okay Appetite is fine Weight is fairly stable    Objective:   Physical Exam  Constitutional: She appears well-developed and well-nourished. No distress.  Neck: Normal range of motion. Neck supple.  Cardiovascular: Normal rate, regular rhythm and normal heart sounds.  Exam reveals no gallop.   No murmur heard.      Faint distal pulses  Pulmonary/Chest: Effort normal and breath sounds normal. No respiratory distress. She has no wheezes. She has no rales.  Abdominal: Soft. There is no tenderness.  Lymphadenopathy:    She has no cervical adenopathy.  Skin: No rash noted. No erythema.       No foot lesions Mycotic toenails  Abdominal wound not checked  Psychiatric: She has a normal mood and affect. Her behavior is normal.          Assessment & Plan:

## 2012-03-05 ENCOUNTER — Encounter: Payer: Self-pay | Admitting: *Deleted

## 2012-03-19 DIAGNOSIS — I10 Essential (primary) hypertension: Secondary | ICD-10-CM

## 2012-03-19 DIAGNOSIS — E119 Type 2 diabetes mellitus without complications: Secondary | ICD-10-CM

## 2012-03-19 DIAGNOSIS — T8189XA Other complications of procedures, not elsewhere classified, initial encounter: Secondary | ICD-10-CM

## 2012-03-21 ENCOUNTER — Other Ambulatory Visit: Payer: Self-pay | Admitting: *Deleted

## 2012-03-21 MED ORDER — FUROSEMIDE 20 MG PO TABS: 20.0000 mg | ORAL_TABLET | Freq: Every day | ORAL | Status: DC

## 2012-03-21 NOTE — Telephone Encounter (Signed)
Rx electronically sent in.

## 2012-03-21 NOTE — Telephone Encounter (Signed)
Last filled 12-21-2011. Okay to fill?

## 2012-03-21 NOTE — Telephone Encounter (Signed)
Okay to fill #30 x 0 

## 2012-03-22 ENCOUNTER — Encounter: Payer: Self-pay | Admitting: Cardiothoracic Surgery

## 2012-03-22 ENCOUNTER — Encounter: Payer: Self-pay | Admitting: Nurse Practitioner

## 2012-03-22 MED ORDER — ONDANSETRON HCL 4 MG PO TABS: 4.0000 mg | ORAL_TABLET | Freq: Three times a day (TID) | ORAL | Status: DC | PRN

## 2012-03-26 ENCOUNTER — Other Ambulatory Visit: Payer: Self-pay | Admitting: *Deleted

## 2012-03-26 MED ORDER — CARBIDOPA-LEVODOPA 25-100 MG PO TABS: 1.0000 | ORAL_TABLET | Freq: Three times a day (TID) | ORAL | Status: DC

## 2012-03-26 MED ORDER — PRAVASTATIN SODIUM 20 MG PO TABS: 20.0000 mg | ORAL_TABLET | Freq: Every day | ORAL | Status: DC

## 2012-04-02 ENCOUNTER — Other Ambulatory Visit: Payer: Self-pay | Admitting: *Deleted

## 2012-04-02 MED ORDER — ALPRAZOLAM 1 MG PO TABS: 0.5000 mg | ORAL_TABLET | Freq: Three times a day (TID) | ORAL | Status: DC | PRN

## 2012-04-02 NOTE — Telephone Encounter (Signed)
rx called into pharmacy

## 2012-04-02 NOTE — Telephone Encounter (Signed)
Okay #75 x 0 

## 2012-04-17 ENCOUNTER — Other Ambulatory Visit: Payer: Self-pay | Admitting: *Deleted

## 2012-04-17 MED ORDER — OMEPRAZOLE 20 MG PO CPDR: 20.0000 mg | DELAYED_RELEASE_CAPSULE | Freq: Every day | ORAL | Status: DC

## 2012-04-18 ENCOUNTER — Other Ambulatory Visit: Payer: Self-pay

## 2012-04-18 MED ORDER — HYDROCODONE-ACETAMINOPHEN 5-325 MG PO TABS: 1.0000 | ORAL_TABLET | Freq: Three times a day (TID) | ORAL | Status: DC | PRN

## 2012-04-18 NOTE — Telephone Encounter (Signed)
Request for hydrocodone/ APAP 5-500 mg # 90. Ok to refill?

## 2012-04-18 NOTE — Telephone Encounter (Signed)
Okay to refill #90 x 0 but please change to the 5/325 dose since the 500mg  doses are being phased out  Let her know that this med only has a slightly decreased dose of the tylenol, not the hydrocodone

## 2012-04-18 NOTE — Telephone Encounter (Signed)
rx called into pharmacy Tried explaining to pt but she really didn't understand, advised to pharmacy that we where changing and if any questions to let us know.

## 2012-04-22 ENCOUNTER — Encounter: Payer: Self-pay | Admitting: Cardiothoracic Surgery

## 2012-04-22 ENCOUNTER — Encounter: Payer: Self-pay | Admitting: Nurse Practitioner

## 2012-04-27 ENCOUNTER — Telehealth: Payer: Self-pay

## 2012-04-27 NOTE — Telephone Encounter (Signed)
Lupita Leash with [facility] wound center; 1) Following chronic abdominal wound; appears larger & infected, getting a culture and possibly start antibiotic. 2) Does [name] have any objection to pt going to [facility]. 3) Pt has a lot of diarrhea, Lupita Leash not sure how much is related to diabetes rather than pancreatic insufficiency. Lupita Leash would appreciate any recommendations or suggestions.Please advise. Geraldine Contras said [name] may not be on computer; sending to [name] and Dr Dayton Martes.

## 2012-04-28 NOTE — Telephone Encounter (Signed)
I will await Dr. Karle Starch return for these recommendations since they are not urgent and I am not familiar with this patient.  He will be back on Tuesday.

## 2012-04-29 NOTE — Telephone Encounter (Signed)
Noted about the chronic abdominal wound. If not familiar with it, it can look inflamed but doing culture makes sense.  Okay [facility] to [facility] if she is willing to go Diarrhea is chronic--can check with patient if it is really worse or not. No reason to think it is diabetes related

## 2012-04-30 ENCOUNTER — Other Ambulatory Visit: Payer: Self-pay | Admitting: *Deleted

## 2012-04-30 MED ORDER — METOPROLOL SUCCINATE ER 200 MG PO TB24: 200.0000 mg | ORAL_TABLET | Freq: Every day | ORAL | Status: DC

## 2012-04-30 MED ORDER — PRAVASTATIN SODIUM 20 MG PO TABS: 20.0000 mg | ORAL_TABLET | Freq: Every day | ORAL | Status: DC

## 2012-05-02 NOTE — Telephone Encounter (Signed)
Let them know that it has been there for many years and we know it will never heal unless they were to to a surgical revision

## 2012-05-02 NOTE — Telephone Encounter (Signed)
See below

## 2012-05-02 NOTE — Telephone Encounter (Signed)
Spoke with Lupita Leash at [facility] wound center, she states pt is now on topical gentamycin because pt can't tolerate systemic antibiotics, if pt does take antibiotics she usually ends up in the hospital with diarrhea so they try to avoid those. Patient can no longer care for her wound so pt's daughter and son are now dressing it. The wound center tried a "silver" product on the wound but pt can't tolerate it long, per wound center the wound is better but it will never heal, it has no order and isn't leaking, they will see patient early next week for f/u. The wound center will also fax over a copy of the wound culture because it's so sensitive to several things. Please advise if anything should be changed. Culture results in your in-box

## 2012-05-02 NOTE — Telephone Encounter (Signed)
Yes, they are aware of the longevity of the wound. They just wanted to keep you updated.

## 2012-05-02 NOTE — Telephone Encounter (Signed)
Wound care center called back to stress the point that they are trying to get patient's diet under control, pt has been eating a lot of white bread, sweets and just whatever she wants. They have spoken with pt's daughter [name] who will start back taking control of the patient, normally the pt's son comes to the visits, but he's had a brain injury and doesn't understand. The wound care center has been in contact with the home health nurse supervisor and social worker to get the pt's house cleaned up and cleaned out, pt was living in filth per the wound care center. They just didn't want [place] to over-react with the wound culture, they have that under control now.

## 2012-05-03 DIAGNOSIS — I10 Essential (primary) hypertension: Secondary | ICD-10-CM

## 2012-05-03 DIAGNOSIS — T8189XA Other complications of procedures, not elsewhere classified, initial encounter: Secondary | ICD-10-CM

## 2012-05-03 DIAGNOSIS — E119 Type 2 diabetes mellitus without complications: Secondary | ICD-10-CM

## 2012-05-07 ENCOUNTER — Other Ambulatory Visit: Payer: Self-pay | Admitting: *Deleted

## 2012-05-07 NOTE — Telephone Encounter (Signed)
Last refilled 04/02/12

## 2012-05-08 MED ORDER — ALPRAZOLAM 1 MG PO TABS: 0.5000 mg | ORAL_TABLET | Freq: Three times a day (TID) | ORAL | Status: DC | PRN

## 2012-05-08 NOTE — Telephone Encounter (Signed)
Okay #75 x 0 

## 2012-05-08 NOTE — Telephone Encounter (Signed)
rx called into pharmacy

## 2012-05-10 ENCOUNTER — Telehealth: Payer: Self-pay | Admitting: *Deleted

## 2012-05-10 NOTE — Telephone Encounter (Signed)
[employer] calling asking for the size of the " large chronic abdominal wound" as stated on the FL-2 form, please correct and fax back to [employer].  I called [facility] wound center and pt has 3 wounds, 1st is 0.8 x 0.5 x 0.2cm, 2nd is 1.1 x 1.2 x 0.3cm, 3rd is 5.3 x 5 x 0.1.  A copy of the FL-2 is on your desk.

## 2012-05-10 NOTE — Telephone Encounter (Signed)
Form addended Please fax back to [employer]

## 2012-05-11 NOTE — Telephone Encounter (Signed)
Form faxed back and re-scanned

## 2012-05-18 ENCOUNTER — Other Ambulatory Visit: Payer: Self-pay | Admitting: *Deleted

## 2012-05-18 MED ORDER — PANCRELIPASE (LIP-PROT-AMYL) 5000 UNITS PO CPEP: 3.0000 | ORAL_CAPSULE | Freq: Three times a day (TID) | ORAL | Status: DC

## 2012-05-21 ENCOUNTER — Other Ambulatory Visit: Payer: Self-pay | Admitting: *Deleted

## 2012-05-22 ENCOUNTER — Encounter: Payer: Self-pay | Admitting: Nurse Practitioner

## 2012-05-22 ENCOUNTER — Other Ambulatory Visit: Payer: Self-pay | Admitting: *Deleted

## 2012-05-22 MED ORDER — HYDROCODONE-ACETAMINOPHEN 5-325 MG PO TABS: 1.0000 | ORAL_TABLET | Freq: Three times a day (TID) | ORAL | Status: DC | PRN

## 2012-05-22 NOTE — Telephone Encounter (Signed)
Fax request asking for MEGACE ES , please advise if ok to refill?

## 2012-05-22 NOTE — Telephone Encounter (Signed)
rx called into pharmacy

## 2012-05-22 NOTE — Telephone Encounter (Signed)
Okay #90 x 0 

## 2012-05-22 NOTE — Telephone Encounter (Signed)
She is not on this as far as I know You can check with her but I am not excited about her taking it even if given by someone else We should probably discuss it in the office at her next visit if she had been taking it

## 2012-05-22 NOTE — Telephone Encounter (Signed)
Spoke with patient and advised results, she will discuss at the next office visit

## 2012-05-29 ENCOUNTER — Encounter: Payer: Self-pay | Admitting: Cardiothoracic Surgery

## 2012-06-04 ENCOUNTER — Telehealth: Payer: Self-pay | Admitting: Internal Medicine

## 2012-06-04 NOTE — Telephone Encounter (Signed)
Spoke with [facility] who is concerned with pt's blood sugar and blood pressure, I advised misty we have received a faxed report also and that Dr.Letvak is out of the office until Wednesday. Misty states she will speak with [name] pt's daughter and see if she wants pt seen. We have received a report from daughter concerning pt's eating habits.  Today pt's BS was 462 then after 8 units of Novolog it was 423, her BP was 172/88, O2 99%, misty is also concerned on pt's eating habits, pt will eat whatever she wants. I advised we do have appt available for tomorrow if pt would see another physician. Misty will have daughter call back for appt if needed.

## 2012-06-04 NOTE — Telephone Encounter (Signed)
Misty with [facility] calling with assessment findings on pt [name], 17-Jun-1929; please call [phone].

## 2012-06-04 NOTE — Telephone Encounter (Signed)
Received v/m from [place] with [facility] to contact Misti with [facility] at [phone]. Geraldine Contras said she has spoken with Misti.

## 2012-06-07 ENCOUNTER — Telehealth: Payer: Self-pay

## 2012-06-07 ENCOUNTER — Ambulatory Visit (INDEPENDENT_AMBULATORY_CARE_PROVIDER_SITE_OTHER): Payer: Medicare Other | Admitting: Internal Medicine

## 2012-06-07 ENCOUNTER — Encounter: Payer: Self-pay | Admitting: Internal Medicine

## 2012-06-07 VITALS — BP 168/98 | HR 80 | Temp 98.0°F | Wt 172.0 lb

## 2012-06-07 DIAGNOSIS — R251 Tremor, unspecified: Secondary | ICD-10-CM | POA: Insufficient documentation

## 2012-06-07 DIAGNOSIS — I5032 Chronic diastolic (congestive) heart failure: Secondary | ICD-10-CM

## 2012-06-07 DIAGNOSIS — R259 Unspecified abnormal involuntary movements: Secondary | ICD-10-CM

## 2012-06-07 DIAGNOSIS — Z23 Encounter for immunization: Secondary | ICD-10-CM

## 2012-06-07 MED ORDER — CARBIDOPA-LEVODOPA CR 25-100 MG PO TBCR: 1.0000 | EXTENDED_RELEASE_TABLET | Freq: Three times a day (TID) | ORAL | Status: DC

## 2012-06-07 NOTE — Telephone Encounter (Signed)
[name] with [facility] request clarification of Levodopa/Carbidopa 25/100. Pt has previously been taking immediate release with instructions one tab tid.  Rx sent today was the CR. Which should pt receive the immediate release or control release.Please advise.

## 2012-06-07 NOTE — Assessment & Plan Note (Signed)
DOE seems stable No changes in Rx for this

## 2012-06-07 NOTE — Progress Notes (Signed)
Subjective:    Patient ID: [name], female    DOB: Jan 30, 1929, 78 y.o.   MRN: [id number]  HPI Here with son, Sharma Covert  Awakening at night nervous and wants to eat Usually will have 1/2 peanut butter sandwich Admits to not great dietary compliance Uses 5 or 8 units before a meal-- depends on sugar value All have been in the 300's and 400's Couldn't tolerate increase of lantus to 30 units--she felt it caused hypoglycemic reactions  Feels very shaky Hard time eating Goes back about 6 months Given meds by surgeon some time back--- the sinemet? Thinks it helps some Some nausea in AM---relates to mucus in throat  Notes some diarrhea Has to use the diarrhea meds  Daughter has been changing abdominal wound Most of the film has come off Continues at wound center  Has some SOB at times Wears oxygen at night Needs it during day at times DOE just walking in house No chest pain  Current Outpatient Prescriptions on File Prior to Visit  Medication Sig Dispense Refill  . ALPRAZolam (XANAX) 1 MG tablet Take 0.5 tablets (0.5 mg total) by mouth 3 (three) times daily as needed.  75 tablet  0  . aspirin 81 MG tablet Take 81 mg by mouth daily.        . busPIRone (BUSPAR) 10 MG tablet Take 1 tablet (10 mg total) by mouth 2 (two) times daily.  60 tablet  11  . carbidopa-levodopa (SINEMET IR) 25-100 MG per tablet Take 1 tablet by mouth 3 (three) times daily.  90 tablet  6  . docusate sodium (COLACE) 100 MG capsule Take 100 mg by mouth daily as needed.      . fluticasone (FLONASE) 50 MCG/ACT nasal spray Place 2 sprays into the nose daily.      . furosemide (LASIX) 20 MG tablet Take 1 tablet (20 mg total) by mouth daily.  30 tablet  11  . Glucosamine-Chondroitin (OSTEO BI-FLEX REGULAR STRENGTH PO) Take 1 tablet by mouth daily.        Marland Kitchen HYDROcodone-acetaminophen (NORCO/VICODIN) 5-325 MG per tablet Take 1 tablet by mouth 3 (three) times daily as needed.  90 tablet  0  . insulin glargine (LANTUS)  100 UNIT/ML injection Inject 25 Units into the skin at bedtime.      . insulin regular (NOVOLIN R,HUMULIN R) 100 units/mL injection Inject 3-8 Units into the skin 3 (three) times daily before meals.      Marland Kitchen levothyroxine (SYNTHROID, LEVOTHROID) 75 MCG tablet Take 1 tablet (75 mcg total) by mouth daily.  30 tablet  11  . lisinopril (PRINIVIL,ZESTRIL) 20 MG tablet Take 1 tablet (20 mg total) by mouth 2 (two) times daily.  60 tablet  11  . loperamide (IMODIUM A-D) 2 MG tablet Take 2 mg by mouth 4 (four) times daily as needed. Non Formula Anti Diarrheal      . metoprolol (TOPROL-XL) 200 MG 24 hr tablet Take 1 tablet (200 mg total) by mouth daily.  30 tablet  11  . Multiple Vitamins-Minerals (CENTRUM SILVER PO) Take 1 tablet by mouth daily.        Marland Kitchen omeprazole (PRILOSEC) 20 MG capsule Take 1 capsule (20 mg total) by mouth daily.  30 capsule  11  . ondansetron (ZOFRAN) 4 MG tablet Take 1 tablet (4 mg total) by mouth every 8 (eight) hours as needed.  30 tablet  0  . Pancrelipase, Lip-Prot-Amyl, 5000 UNITS CPEP Take 3 capsules (15,000 Units total) by mouth  3 (three) times daily.  270 capsule  3  . pravastatin (PRAVACHOL) 20 MG tablet Take 1 tablet (20 mg total) by mouth daily.  30 tablet  11  . Syringe/Needle, Disp, (B-D ECLIPSE SYRINGE) 30G X 1/2" 1 ML MISC as directed.          Allergies  Allergen Reactions  . Citalopram Hydrobromide     REACTION: nausea, dizzy, dry mouth  . Diphenhydramine Hcl     REACTION: unspecified  . Metoclopramide Hcl     REACTION: unspecified  . Morphine Sulfate     REACTION: IV administration caused itching  . Paroxetine     REACTION: hallucinations    Past Medical History  Diagnosis Date  . Anxiety   . CHF (congestive heart failure)   . Depression   . Diabetes mellitus   . GERD (gastroesophageal reflux disease)   . Hypertension   . Thyroid disease   . Osteoporosis   . Allergy   . Hyperlipidemia   . RLS (restless legs syndrome)   . Pancreatic disease      pancreatic failure  . Urinary incontinence   . Pancreatitis     Pancreatic resection/ open wound- [facility] 11/04  . Pneumonia     Pneumonia/ Emphysema 05/05  . Diplopia     Diplopia- ? TIA, Carotid negative 11/07    Past Surgical History  Procedure Date  . Cholecystectomy   . Breast lumpectomy     Lumpectomy right breast  . 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 Abdominal hysterectomy     Hysteroscopy/ D & C ([facility]) 12/05  . Kyphosis surgery 11/05    No family history on file.  History   Social History  . Marital Status: Widowed    Spouse Name: N/A    Number of Children: 7  . Years of Education: N/A   Occupational History  . retired- Veterinary surgeon    Social History Main Topics  . Smoking status: Never Smoker   . Smokeless tobacco: Never Used  . Alcohol Use: No  . Drug Use: No  . Sexually Active: Not on file   Other Topics Concern  . Not on file   Social History Narrative   Has [name], daughter, as health care POANot sure about DNR   Review of Systems Restless sleeper Weight fairly stable    Objective:   Physical Exam  Constitutional: She appears well-developed and well-nourished. No distress.  Neck: Normal range of motion. Neck supple.  Cardiovascular: Normal rate, regular rhythm and normal heart sounds.  Exam reveals no gallop.   No murmur heard. Pulmonary/Chest: Effort normal and breath sounds normal. No respiratory distress. She has no wheezes. She has no rales.  Lymphadenopathy:    She has no cervical adenopathy.  Neurological:       Resting tremor which persists with movement At most mild bradykinesia Normal tone  Skin:       Abdominal wound same size Has some thick yellow slough along most of left half of wound Not infected          Assessment & Plan:

## 2012-06-07 NOTE — Telephone Encounter (Signed)
I want her to try the CR because it sounds like the IR is wearing off I know the CR is often bid but at that low dose, I want her to use that tid still (and may help at night also)  I called to let him know

## 2012-06-07 NOTE — Patient Instructions (Signed)
Please set up the appointment for the diabetes specialist  Change the carbidopa-levodopa to the new prescription. It should last longer

## 2012-06-07 NOTE — Assessment & Plan Note (Signed)
Not really compliant with diet No exercise Has resisted increase in lantus due to hypoglycemic reactions Will refer to endocrinology

## 2012-06-07 NOTE — Assessment & Plan Note (Signed)
Not clearly Parkinsons but she thinks she was better with the sinemet Will change to CR for smoother effect

## 2012-06-07 NOTE — Addendum Note (Signed)
Addended by: Sueanne Margarita on: 06/07/2012 02:41 PM   Modules accepted: Orders

## 2012-06-13 ENCOUNTER — Other Ambulatory Visit: Payer: Self-pay | Admitting: *Deleted

## 2012-06-13 MED ORDER — ONDANSETRON HCL 4 MG PO TABS: 4.0000 mg | ORAL_TABLET | Freq: Three times a day (TID) | ORAL | Status: DC | PRN

## 2012-06-18 ENCOUNTER — Other Ambulatory Visit: Payer: Self-pay | Admitting: *Deleted

## 2012-06-18 MED ORDER — ALPRAZOLAM 1 MG PO TABS: 0.5000 mg | ORAL_TABLET | Freq: Three times a day (TID) | ORAL | Status: DC | PRN

## 2012-06-18 MED ORDER — HYDROCODONE-ACETAMINOPHEN 5-325 MG PO TABS: 1.0000 | ORAL_TABLET | Freq: Three times a day (TID) | ORAL | Status: DC | PRN

## 2012-06-18 NOTE — Telephone Encounter (Signed)
Okay alprazolam #75 x 0  Hydrocodone #90 x 0

## 2012-06-18 NOTE — Telephone Encounter (Signed)
rx called into pharmacy

## 2012-06-19 ENCOUNTER — Emergency Department: Payer: Self-pay | Admitting: Emergency Medicine

## 2012-06-19 LAB — COMPREHENSIVE METABOLIC PANEL
Albumin: 3.3 g/dL — ABNORMAL LOW (ref 3.4–5.0)
Alkaline Phosphatase: 98 U/L (ref 50–136)
Anion Gap: 12 (ref 7–16)
Calcium, Total: 8.4 mg/dL — ABNORMAL LOW (ref 8.5–10.1)
Chloride: 107 mmol/L (ref 98–107)
EGFR (African American): 58 — ABNORMAL LOW
EGFR (Non-African Amer.): 50 — ABNORMAL LOW
Glucose: 231 mg/dL — ABNORMAL HIGH (ref 65–99)
Osmolality: 299 (ref 275–301)
Potassium: 3.6 mmol/L (ref 3.5–5.1)
SGOT(AST): 19 U/L (ref 15–37)
Sodium: 146 mmol/L — ABNORMAL HIGH (ref 136–145)

## 2012-06-19 LAB — CBC
HCT: 38.7 % (ref 35.0–47.0)
HGB: 13 g/dL (ref 12.0–16.0)
MCH: 30.8 pg (ref 26.0–34.0)
MCV: 91 fL (ref 80–100)
Platelet: 129 10*3/uL — ABNORMAL LOW (ref 150–440)
RBC: 4.23 10*6/uL (ref 3.80–5.20)
RDW: 13.3 % (ref 11.5–14.5)
WBC: 6.7 10*3/uL (ref 3.6–11.0)

## 2012-06-19 LAB — URINALYSIS, COMPLETE
Bilirubin,UR: NEGATIVE
Ketone: NEGATIVE
Leukocyte Esterase: NEGATIVE
Ph: 5 (ref 4.5–8.0)
Protein: NEGATIVE
RBC,UR: 1 /HPF (ref 0–5)
Specific Gravity: 1.008 (ref 1.003–1.030)
Squamous Epithelial: 1

## 2012-06-19 LAB — TROPONIN I: Troponin-I: <0.02 ng/mL

## 2012-06-22 ENCOUNTER — Encounter: Payer: Self-pay | Admitting: Cardiothoracic Surgery

## 2012-06-22 ENCOUNTER — Telehealth: Payer: Self-pay | Admitting: Internal Medicine

## 2012-06-22 ENCOUNTER — Encounter: Payer: Self-pay | Admitting: Nurse Practitioner

## 2012-06-22 NOTE — Telephone Encounter (Signed)
Defer the megace to Dr. Elbert Ewings.   She needs BP rechecked, either here or at [facility].  Okay to add on at 4:30 if they can come in.

## 2012-06-22 NOTE — Telephone Encounter (Signed)
Why don't we work her in the day after Thanksgiving? Nov 29th

## 2012-06-22 NOTE — Telephone Encounter (Signed)
Caller: [name]; Patient Name: [name]; PCP: [facility]); Best Callback Phone Number: [phone]; daughter is calling and states that pt's appetite has been down for the last 3-4 weeks; now she is barely forcing foods down; pt is getting weaker; went to ED on 06/18/12 for weakness; daughter states everything was "okay" except BP was 270/120; IVF were given and BP came down and she was sent home; has not had BP checked again since 06/18/12; reports that she can not recheck BP; daughter states that she was on Megace but was taken off 2-3 months ago; daughter would like mom restarted on Megace; daughter states pt is weak by history; having some unintentional weight loss; Triaged per Weight Loss, Unintentional Guideline; See in 72 hours due to loss of appetite for more than 3 days; NO APPT FOR TODAY;  OFFICE PLEASE REVIEW AND CALL DAUGHTER REGARDING MEGACE AND POSSIBLE APPT DUE TO LOSS OF APPETITE, WEAKNESS AND INCREASE BP ON 06/18/12 THAT HAS NOT BEEN CHECKED AGAIN;  [facility] on [address]

## 2012-06-22 NOTE — Telephone Encounter (Signed)
Hillis Range jurkowich called to see if you could work this pt in earlier than 09/29/11

## 2012-06-22 NOTE — Telephone Encounter (Signed)
Spoke with daughter, [name] who says she cannot bring her in the office today before closing.  I stressed the importance of getting her BP checked again today by UC or EMT's or somebody.  Daughter says she will take her to an UC after 5 pm.  Daughter advised not to restart Megace until she hears from Dr. Alphonsus Sias because her BP is more concerning than the weight loss.

## 2012-06-23 NOTE — Telephone Encounter (Signed)
Please check on her on Monday Find out if she is checking to Dr Jorja Loa has offered appt 11/29 I just saw her and she had not lost weight so I would not recommend the megace at this point If she is staying with me, set up in the next couple of weeks to recheck the BP

## 2012-06-25 ENCOUNTER — Telehealth: Payer: Self-pay | Admitting: Internal Medicine

## 2012-06-25 NOTE — Telephone Encounter (Signed)
okay

## 2012-06-25 NOTE — Telephone Encounter (Signed)
Mitzi with [facility] called about clarification of instructions of Xanax.Please advise.

## 2012-06-25 NOTE — Telephone Encounter (Signed)
pts daughter said when pt last seen Xanax instructions changed to 1/2 pill in AM,1/2 pill at lunch and 1 pill at hs. [facility] not notified of new instructions. Pt has one pill left.Request call in to [facility] today.

## 2012-06-25 NOTE — Telephone Encounter (Signed)
Order just sent in a few days ago She is allowed #75 per month (so up to 2.5 tabs per day)

## 2012-06-25 NOTE — Telephone Encounter (Signed)
Yes she is changing to Dr.Walker because it's closer, daughter will call and schedule appt with Dr.Walker on 11/29. Will cancel appt this Friday.

## 2012-06-26 NOTE — Telephone Encounter (Signed)
I called pt's daughter and called [facility] to make them aware of instruction change to rx , they had to d/c the current rx on file (it had not picked up yet) and open a new one with the new instructions, same quantity with no refills. I will update in emr.

## 2012-06-26 NOTE — Telephone Encounter (Signed)
Pt daughter aware of appointment 11/29

## 2012-06-29 ENCOUNTER — Ambulatory Visit: Payer: Medicare Other | Admitting: Internal Medicine

## 2012-07-05 ENCOUNTER — Ambulatory Visit: Payer: Medicare Other | Admitting: Internal Medicine

## 2012-07-13 DIAGNOSIS — I10 Essential (primary) hypertension: Secondary | ICD-10-CM

## 2012-07-13 DIAGNOSIS — T8189XA Other complications of procedures, not elsewhere classified, initial encounter: Secondary | ICD-10-CM

## 2012-07-13 DIAGNOSIS — E119 Type 2 diabetes mellitus without complications: Secondary | ICD-10-CM

## 2012-07-16 ENCOUNTER — Other Ambulatory Visit: Payer: Self-pay | Admitting: *Deleted

## 2012-07-16 NOTE — Telephone Encounter (Signed)
Okay #90 x 0 

## 2012-07-16 NOTE — Telephone Encounter (Signed)
Last refilled 06/18/12

## 2012-07-17 MED ORDER — HYDROCODONE-ACETAMINOPHEN 5-325 MG PO TABS: 1.0000 | ORAL_TABLET | Freq: Three times a day (TID) | ORAL | Status: DC | PRN

## 2012-07-17 NOTE — Telephone Encounter (Signed)
rx called into pharmacy

## 2012-07-20 ENCOUNTER — Ambulatory Visit: Payer: Medicare Other | Admitting: Internal Medicine

## 2012-07-22 ENCOUNTER — Encounter: Payer: Self-pay | Admitting: Nurse Practitioner

## 2012-07-22 ENCOUNTER — Encounter: Payer: Self-pay | Admitting: Cardiothoracic Surgery

## 2012-07-23 ENCOUNTER — Other Ambulatory Visit: Payer: Self-pay | Admitting: *Deleted

## 2012-07-23 MED ORDER — ALPRAZOLAM 1 MG PO TABS: ORAL_TABLET | ORAL | Status: DC

## 2012-07-23 NOTE — Telephone Encounter (Signed)
Med filled.  

## 2012-07-23 NOTE — Telephone Encounter (Signed)
Will forward to Dr [name]

## 2012-08-06 ENCOUNTER — Telehealth: Payer: Self-pay | Admitting: Internal Medicine

## 2012-08-06 NOTE — Telephone Encounter (Signed)
Pt daughter [name] called wanting to get [name] in to see you.  She had appointment 07/20/12 but she had [place] access.  [name] stated that did receive her medicaid card and it is not [place] access now.  Pt has medicare and medicaid Please advise when i can put her in as new pt.

## 2012-08-06 NOTE — Telephone Encounter (Signed)
This pt should be put in a 45 min slot. Ok if that is out Feb/Mar?

## 2012-08-07 ENCOUNTER — Other Ambulatory Visit: Payer: Self-pay | Admitting: *Deleted

## 2012-08-07 MED ORDER — "SYRINGE/NEEDLE (DISP) 30G X 1/2"" 1 ML MISC": Status: DC

## 2012-08-07 NOTE — Telephone Encounter (Signed)
Appointment 10/01/12 daughter [name]) aware of appointment

## 2012-08-09 ENCOUNTER — Telehealth: Payer: Self-pay

## 2012-08-09 NOTE — Telephone Encounter (Signed)
Thaddeus with [facility] received unspecified denial on medical equipment for pt; Waylan Rocher wants reason for denial. [name],Dr Karle Starch CMA said pts family said pt did not need the requested braces etc. And request was denied. Waylan Rocher wants to resubmit request; advised PCP has changed. Waylan Rocher will contact pts PCP.

## 2012-08-10 ENCOUNTER — Ambulatory Visit: Payer: Medicare Other | Admitting: Internal Medicine

## 2012-08-14 ENCOUNTER — Other Ambulatory Visit: Payer: Self-pay | Admitting: *Deleted

## 2012-08-14 MED ORDER — HYDROCODONE-ACETAMINOPHEN 5-325 MG PO TABS: 1.0000 | ORAL_TABLET | Freq: Three times a day (TID) | ORAL | Status: DC | PRN

## 2012-08-14 NOTE — Telephone Encounter (Signed)
Okay #90 x 0 Seeing Rachel the NP in 3 weeks This should be the last refill here

## 2012-08-14 NOTE — Telephone Encounter (Signed)
rx called into pharmacy

## 2012-08-14 NOTE — Telephone Encounter (Signed)
FYI pt is transferring to Dr [name], but does not see her until Feb 2014.

## 2012-08-22 ENCOUNTER — Encounter: Payer: Self-pay | Admitting: Cardiothoracic Surgery

## 2012-08-22 ENCOUNTER — Encounter: Payer: Self-pay | Admitting: Nurse Practitioner

## 2012-08-28 ENCOUNTER — Other Ambulatory Visit: Payer: Self-pay | Admitting: *Deleted

## 2012-08-28 NOTE — Telephone Encounter (Signed)
Last filled 07/30/12, no longer Dr.Letvak's patient

## 2012-08-31 MED ORDER — ALPRAZOLAM 1 MG PO TABS: ORAL_TABLET | ORAL | Status: DC

## 2012-08-31 NOTE — Telephone Encounter (Signed)
Will route to Dr.Walker also, looks like Lowella Bandy has already

## 2012-08-31 NOTE — Telephone Encounter (Signed)
Patient called for refill.  She has one tablet left.  States she hasn't seen Dr. [name] yet, her first appt there is 1/14.  She's asking that we send in a refill for her.

## 2012-08-31 NOTE — Telephone Encounter (Signed)
Check with the [place] office to make sure she gets refill I would approve #60 x 0 this last time till she establishes there

## 2012-08-31 NOTE — Telephone Encounter (Signed)
Dr. [name] denied this Rx because patient hasn't been seen in our office.  Patient will need to get PCP to refill until she establishes care with our office.

## 2012-08-31 NOTE — Telephone Encounter (Signed)
rx called into pharmacy

## 2012-08-31 NOTE — Addendum Note (Signed)
Addended by: Sueanne Margarita on: 08/31/2012 04:30 PM   Modules accepted: Orders

## 2012-08-31 NOTE — Telephone Encounter (Signed)
Last refill on 07/23/12 was done by Dr.Walker, that's why I sent rx to Dr.Walker ( see med list)

## 2012-09-04 ENCOUNTER — Encounter: Payer: Self-pay | Admitting: Adult Health

## 2012-09-04 ENCOUNTER — Ambulatory Visit (INDEPENDENT_AMBULATORY_CARE_PROVIDER_SITE_OTHER): Payer: Medicare Other | Admitting: Adult Health

## 2012-09-04 VITALS — BP 198/97 | HR 67 | Temp 98.7°F | Resp 18 | Ht 59.0 in | Wt 172.0 lb

## 2012-09-04 DIAGNOSIS — Z0000 Encounter for general adult medical examination without abnormal findings: Secondary | ICD-10-CM

## 2012-09-04 DIAGNOSIS — E119 Type 2 diabetes mellitus without complications: Secondary | ICD-10-CM

## 2012-09-04 DIAGNOSIS — E039 Hypothyroidism, unspecified: Secondary | ICD-10-CM

## 2012-09-04 DIAGNOSIS — I10 Essential (primary) hypertension: Secondary | ICD-10-CM

## 2012-09-04 NOTE — Progress Notes (Signed)
Subjective:    Patient ID: [name], female    DOB: October 05, 1928, 79 y.o.   MRN: [id number]  HPI  Patient is here today to establish care in our [facility]. She was previously followed by Dr. Alphonsus Sias in the [facility] office.  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 Patient reports that she fell Christmas Day on brick steps. She c/o pain in her right calf.   Current Outpatient Prescriptions on File Prior to Visit  Medication Sig Dispense Refill  . ALPRAZolam (XANAX) 1 MG tablet Take 1/2 tab in am, 1/2 tab at lunch and 1 tab at bedtime as needed  60 tablet  0  . aspirin 81 MG tablet Take 81 mg by mouth daily.        . busPIRone (BUSPAR) 10 MG tablet Take 1 tablet (10 mg total) by mouth 2 (two) times daily.  60 tablet  11  . carbidopa-levodopa (SINEMET CR) 25-100 MG per tablet Take 1 tablet by mouth 3 (three) times daily.  90 tablet  5  . docusate sodium (COLACE) 100 MG capsule Take 100 mg by mouth daily as needed.      . fluticasone (FLONASE) 50 MCG/ACT nasal spray Place 2 sprays into the nose daily.      . furosemide (LASIX) 20 MG tablet Take 1 tablet (20 mg total) by mouth daily.  30 tablet  11  . Glucosamine-Chondroitin (OSTEO BI-FLEX REGULAR STRENGTH PO) Take 1 tablet by mouth daily.        Marland Kitchen HYDROcodone-acetaminophen (NORCO/VICODIN) 5-325 MG per tablet Take 1 tablet by mouth 3 (three) times daily as needed.  90 tablet  0  . insulin glargine (LANTUS) 100 UNIT/ML injection Inject 25 Units into the skin at bedtime.      . insulin lispro (HUMALOG) 100 UNIT/ML injection Inject 5-17 Units into the skin 3 (three) times daily before meals.      Marland Kitchen levothyroxine (SYNTHROID,  LEVOTHROID) 75 MCG tablet Take 1 tablet (75 mcg total) by mouth daily.  30 tablet  11  . lisinopril (PRINIVIL,ZESTRIL) 20 MG tablet Take 1 tablet (20 mg total) by mouth 2 (two) times daily.  60 tablet  11  . loperamide (IMODIUM A-D) 2 MG tablet Take 2 mg by mouth 4 (four) times daily as needed. Non Formula Anti Diarrheal      . metoprolol (TOPROL-XL) 200 MG 24 hr tablet Take 1 tablet (200 mg total) by mouth daily.  30 tablet  11  . Multiple Vitamins-Minerals (CENTRUM SILVER PO) Take 1 tablet by mouth daily.        Marland Kitchen omeprazole (PRILOSEC) 20 MG capsule Take 1 capsule (20 mg total) by mouth daily.  30 capsule  11  . ondansetron (ZOFRAN) 4 MG tablet Take 1 tablet (4 mg total) by mouth every 8 (eight) hours as needed.  30 tablet  0  . Pancrelipase, Lip-Prot-Amyl, 5000 UNITS CPEP Take 3 capsules (15,000 Units total) by mouth 3 (three) times daily.  270 capsule  3  . pravastatin (PRAVACHOL) 20 MG tablet Take 1 tablet (20 mg total) by mouth daily.  30 tablet  11  . Syringe/Needle, Disp, (B-D ECLIPSE SYRINGE) 30G X 1/2" 1 ML MISC as directed.  100 each  6  .  insulin regular (NOVOLIN R,HUMULIN R) 100 units/mL injection Inject 3-8 Units into the skin 3 (three) times daily before meals.          Review of Systems  Constitutional: Positive for fatigue.  HENT: Positive for postnasal drip. Negative for congestion and sore throat.   Eyes: Negative.   Respiratory: Negative.   Cardiovascular: Positive for leg swelling.  Gastrointestinal: Positive for diarrhea. Negative for nausea, vomiting and abdominal pain.       Patient is s/p removal of pancreas 2/2 necrotizing pancreatitis. Non-healing surgical wound.  Genitourinary: Negative for dysuria, frequency and flank pain.  Neurological: Positive for tremors and weakness. Negative for dizziness, light-headedness, numbness and headaches.  Psychiatric/Behavioral: Negative for hallucinations, behavioral problems, self-injury and agitation. The patient is not  nervous/anxious.        Objective:   Physical Exam  Constitutional: She is oriented to person, place, and time. She appears well-developed and well-nourished. No distress.       Patient is pleasant and cooperative.  HENT:  Head: Normocephalic and atraumatic.  Right Ear: External ear normal.  Left Ear: External ear normal.  Nose: Nose normal.  Mouth/Throat: Oropharynx is clear and moist.  Eyes: Conjunctivae normal are normal. Pupils are equal, round, and reactive to light. No scleral icterus.  Neck: Normal range of motion. Neck supple. No tracheal deviation present. No thyromegaly present.  Cardiovascular: Normal rate, regular rhythm, normal heart sounds and intact distal pulses.  Exam reveals no gallop.   No murmur heard. Pulmonary/Chest: Effort normal and breath sounds normal. No respiratory distress. She has no wheezes. She has no rales.  Abdominal: Soft. Bowel sounds are normal. She exhibits no mass. There is no tenderness.       Non-healing surgical wound since removal of pancreas in 2004. Pt is followed at wound clinic and also has Home Health Nursing for wound care.  Musculoskeletal: She exhibits edema.       Edema RLE > LLE.  Utilizes walker for ambulation.  Lymphadenopathy:    She has no cervical adenopathy.  Neurological: She is alert and oriented to person, place, and time.  Skin: Skin is warm and dry.       Abdominal wound  Psychiatric: She has a normal mood and affect. Her behavior is normal. Judgment and thought content normal.          Assessment & Plan:

## 2012-09-04 NOTE — Assessment & Plan Note (Signed)
Reporting slight fatigue and pain in RLE s/p fall. RLE edema > LLE.   Will obtain labs: CBC w/diff, cmet, lipid panel, TSH. Will have home health nurse draw labs next Monday. Patient is not fasting at present. Also obtain doppler of RLE to r/o DVT.

## 2012-09-04 NOTE — Patient Instructions (Addendum)
  Thank you for choosing [facility] for your health care needs.  Please have the ultrasound of your right lower extremity done to check for a blood clot.  I will send a message to [facility] at [facility] to have her check your blood pressure 3 times in one week and report. If it is still elevated, we will prescribe another medication for your blood pressure. I will also ask Misty to check labs on Monday. We will let you know of the results.  Please schedule a follow up appointment with Dr. [name] in 3-4 months.   Please call if you have any questions or concerns.

## 2012-09-04 NOTE — Assessment & Plan Note (Signed)
Patient is being followed by Dr. Tedd Sias at [facility]. Patient reports that last HgbA1C is improved. Will request labs from [facility].

## 2012-09-04 NOTE — Assessment & Plan Note (Addendum)
B/P elevated during visit. Recheck B/P 158/100. Patient reports her b/p is usually WNL. This may be transient. Will have [facility] RN check b/p reading 3 times for 1 week and report findings. If continue to stay elevated will consider adding third agent or adjusting current dose.

## 2012-09-04 NOTE — Assessment & Plan Note (Signed)
Has been well controlled on synthroid 75 mcg daily. Check TSH.

## 2012-09-10 ENCOUNTER — Telehealth: Payer: Self-pay | Admitting: *Deleted

## 2012-09-10 ENCOUNTER — Telehealth: Payer: Self-pay | Admitting: Internal Medicine

## 2012-09-10 NOTE — Telephone Encounter (Signed)
No DVT by recent ultrasound

## 2012-09-10 NOTE — Telephone Encounter (Signed)
No longer Dr.Letvak's patient

## 2012-09-10 NOTE — Telephone Encounter (Signed)
Pt.notified

## 2012-09-11 ENCOUNTER — Other Ambulatory Visit: Payer: Self-pay | Admitting: Adult Health

## 2012-09-11 MED ORDER — HYDROCODONE-ACETAMINOPHEN 5-325 MG PO TABS: 1.0000 | ORAL_TABLET | Freq: Three times a day (TID) | ORAL | Status: DC | PRN

## 2012-09-11 NOTE — Telephone Encounter (Signed)
THIS IS NOT OUR PATIENT, PLEASE ROUTE TO THE CORRECT PHYSICAN

## 2012-09-11 NOTE — Telephone Encounter (Signed)
Thank you for forwarding the message regarding refill for Ms. Reaver. I sent in the refill to [facility] this morning when I received the first message.  Thanks, Kimiyah Blick

## 2012-09-11 NOTE — Telephone Encounter (Signed)
Patient is requesting a refill

## 2012-09-11 NOTE — Telephone Encounter (Signed)
This is [name]. Was seen on 09/04/12

## 2012-09-19 ENCOUNTER — Telehealth: Payer: Self-pay | Admitting: *Deleted

## 2012-09-19 NOTE — Telephone Encounter (Signed)
Refill request   Zenpep  #270  Take 3 capsules by mouth  3 times a day

## 2012-09-20 ENCOUNTER — Other Ambulatory Visit: Payer: Self-pay | Admitting: Adult Health

## 2012-09-20 MED ORDER — PANCRELIPASE (LIP-PROT-AMYL) 5000 UNITS PO CPEP: 3.0000 | ORAL_CAPSULE | Freq: Three times a day (TID) | ORAL | Status: DC

## 2012-09-20 NOTE — Telephone Encounter (Signed)
Refill sent.

## 2012-09-22 ENCOUNTER — Encounter: Payer: Self-pay | Admitting: Nurse Practitioner

## 2012-09-22 ENCOUNTER — Encounter: Payer: Self-pay | Admitting: Cardiothoracic Surgery

## 2012-09-27 ENCOUNTER — Other Ambulatory Visit: Payer: Self-pay | Admitting: Family Medicine

## 2012-09-27 MED ORDER — LISINOPRIL 20 MG PO TABS: 20.0000 mg | ORAL_TABLET | Freq: Two times a day (BID) | ORAL | Status: DC

## 2012-09-27 MED ORDER — ALPRAZOLAM 1 MG PO TABS: ORAL_TABLET | ORAL | Status: DC

## 2012-09-27 NOTE — Telephone Encounter (Signed)
RX refill called in to [facility].

## 2012-09-28 ENCOUNTER — Ambulatory Visit: Payer: Medicare Other | Admitting: Internal Medicine

## 2012-10-01 ENCOUNTER — Other Ambulatory Visit: Payer: Self-pay | Admitting: *Deleted

## 2012-10-01 ENCOUNTER — Ambulatory Visit: Payer: Medicare Other | Admitting: Internal Medicine

## 2012-10-01 MED ORDER — BUSPIRONE HCL 10 MG PO TABS: 10.0000 mg | ORAL_TABLET | Freq: Two times a day (BID) | ORAL | Status: DC

## 2012-10-01 NOTE — Telephone Encounter (Signed)
Raquel would you like for me to refill this script?

## 2012-10-01 NOTE — Telephone Encounter (Signed)
Refill sent electronically

## 2012-10-01 NOTE — Telephone Encounter (Signed)
Yes, please refill her Buspar

## 2012-10-10 ENCOUNTER — Other Ambulatory Visit: Payer: Self-pay | Admitting: *Deleted

## 2012-10-10 NOTE — Telephone Encounter (Signed)
Received a faxed refill request for Zofran. This pt is longer a patient of [name], I will forward to her new provider.

## 2012-10-11 ENCOUNTER — Encounter: Payer: Self-pay | Admitting: Internal Medicine

## 2012-10-11 ENCOUNTER — Ambulatory Visit (INDEPENDENT_AMBULATORY_CARE_PROVIDER_SITE_OTHER): Payer: Medicare Other | Admitting: Internal Medicine

## 2012-10-11 VITALS — BP 150/90 | HR 69 | Temp 98.0°F | Ht <= 58 in | Wt 170.0 lb

## 2012-10-11 LAB — CBC WITH DIFFERENTIAL/PLATELET
Basophils Relative: 0.3 % (ref 0.0–3.0)
Eosinophils Absolute: 0.1 10*3/uL (ref 0.0–0.7)
Eosinophils Relative: 1.7 % (ref 0.0–5.0)
Hemoglobin: 12.9 g/dL (ref 12.0–15.0)
Lymphocytes Relative: 29.5 % (ref 12.0–46.0)
MCHC: 33.5 g/dL (ref 30.0–36.0)
MCV: 88.7 fl (ref 78.0–100.0)
Neutro Abs: 3.4 10*3/uL (ref 1.4–7.7)
Neutrophils Relative %: 61.3 % (ref 43.0–77.0)
RBC: 4.36 Mil/uL (ref 3.87–5.11)
WBC: 5.5 10*3/uL (ref 4.5–10.5)

## 2012-10-11 LAB — LIPID PANEL
Cholesterol: 162 mg/dL (ref 0–200)
HDL: 41 mg/dL (ref >39.00)
Total CHOL/HDL Ratio: 4
Triglycerides: 259 mg/dL — ABNORMAL HIGH (ref 0.0–149.0)
VLDL: 51.8 mg/dL — ABNORMAL HIGH (ref 0.0–40.0)

## 2012-10-11 LAB — COMPREHENSIVE METABOLIC PANEL
AST: 18 U/L (ref 0–37)
Albumin: 3.8 g/dL (ref 3.5–5.2)
Alkaline Phosphatase: 85 U/L (ref 39–117)
BUN: 19 mg/dL (ref 6–23)
Calcium: 9.2 mg/dL (ref 8.4–10.5)
Creatinine, Ser: 0.8 mg/dL (ref 0.4–1.2)
Glucose, Bld: 54 mg/dL — ABNORMAL LOW (ref 70–99)

## 2012-10-11 LAB — LDL CHOLESTEROL, DIRECT: Direct LDL: 80.7 mg/dL

## 2012-10-11 MED ORDER — TRIAMCINOLONE ACETONIDE 0.1 % EX CREA: TOPICAL_CREAM | Freq: Two times a day (BID) | CUTANEOUS | Status: DC

## 2012-10-11 MED ORDER — FLUTICASONE PROPIONATE 50 MCG/ACT NA SUSP: 2.0000 | Freq: Every day | NASAL | Status: DC

## 2012-10-11 MED ORDER — ONDANSETRON HCL 4 MG PO TABS: 4.0000 mg | ORAL_TABLET | Freq: Three times a day (TID) | ORAL | Status: DC | PRN

## 2012-10-11 NOTE — Assessment & Plan Note (Signed)
Rash over forehead most consistent with dermatitis. Will start topical triamincolone cream to see if any improvement. Follow up 4 weeks and prn.

## 2012-10-11 NOTE — Assessment & Plan Note (Signed)
Lab Results  Component Value Date   LDLCALC 86 02/18/2011   Lipids well controlled on Pravastatin. Will continue.

## 2012-10-11 NOTE — Telephone Encounter (Signed)
[facility] called per Raquel

## 2012-10-11 NOTE — Assessment & Plan Note (Signed)
Followed at wound center. Based on pt report and exam, wound appears to be stable. Will request records from wound healing center.

## 2012-10-11 NOTE — Assessment & Plan Note (Signed)
Recent A1c 6.8%. Will continue current medications. BG noted to be low on labs today. Will encourage pt to monitor BG carefully at home and call if BG<80, as may need eventually to titrate down on insulin.

## 2012-10-11 NOTE — Assessment & Plan Note (Signed)
BP Readings from Last 3 Encounters:  10/11/12 150/90  09/04/12 198/97  06/07/12 168/98   BP elevated on last three visits. Pt reports BP better controlled at home. Will try to get records from [facility] RN to see BP readings at home. Follow up 4 weeks.

## 2012-10-11 NOTE — Telephone Encounter (Signed)
Asher Muir,  Please call [facility] and let them know that [name] is coming to the [place] office now. She is being followed by Dr. [name]. Any requests for refills should be sent to our office since she is no longer a patient of Dr. Alphonsus Sias. Please provide them with our fax #.  Raquel

## 2012-10-11 NOTE — Assessment & Plan Note (Signed)
Continue orthopedic care with serial cortisone shots.  Continue hydrocodone prn.

## 2012-10-11 NOTE — Assessment & Plan Note (Signed)
Platelets stable on labs today. Will plan to repeat CBC in 12/2012.

## 2012-10-11 NOTE — Assessment & Plan Note (Signed)
TSH normal today. Will continue Synthroid.

## 2012-10-11 NOTE — Progress Notes (Signed)
Subjective:    Patient ID: [name], female    DOB: 05/11/1929, 79 y.o.   MRN: [id number]  HPI 79 year old female with history of pancreatitis status post partial pancreatectomy, prolonged hospitalization with wound dehiscence and sepsis, diabetes, Parkinson's disease, arthritis presents to reestablish care. She was previously seen by another provider in our office. She reports that she is generally doing well. She has several concerns however. First, she notes a rash over her forehead. This is been present off and on for several weeks. It is described as red and occasionally itchy. It is not painful. She is unsure of any new exposures. She denies use of any new lotions or creams. She notes chronic watery eyes and sneezing which is made worse when she goes outdoors. She was written for Flonase in the past but never started this medication. She does not take any antihistamines.  She also notes chronic pain in her left knee. She reports she is followed by orthopedic surgeon and had serial steroid and cartilage injections with some improvement. She questions whether she might benefit from motorized wheelchair but reports that she would prefer to stay ambulatory and use her Duell Holdren for now. She has had falls over the last year. No recent fractures.  In regards to diabetes, she reports that blood sugars have been well-controlled and brings records showing blood sugars typically 100-150. She reports full compliance with her insulin. She denies any low blood sugars less than 80.  Outpatient Encounter Prescriptions as of 10/11/2012  Medication Sig Dispense Refill  . ALPRAZolam (XANAX) 1 MG tablet Take 1/2 tab in am, 1/2 tab at lunch and 1 tab at bedtime as needed  60 tablet  0  . aspirin 81 MG tablet Take 81 mg by mouth daily.        . busPIRone (BUSPAR) 10 MG tablet Take 1 tablet (10 mg total) by mouth 2 (two) times daily.  60 tablet  3  . carbidopa-levodopa (SINEMET CR) 25-100 MG per tablet Take 1  tablet by mouth 3 (three) times daily.  90 tablet  5  . docusate sodium (COLACE) 100 MG capsule Take 100 mg by mouth daily as needed.      . fluticasone (FLONASE) 50 MCG/ACT nasal spray Place 2 sprays into the nose daily.  16 g  3  . furosemide (LASIX) 20 MG tablet Take 1 tablet (20 mg total) by mouth daily.  30 tablet  11  . Glucosamine-Chondroitin (OSTEO BI-FLEX REGULAR STRENGTH PO) Take 1 tablet by mouth daily.        Marland Kitchen HYDROcodone-acetaminophen (NORCO/VICODIN) 5-325 MG per tablet Take 1 tablet by mouth 3 (three) times daily as needed.  90 tablet  0  . insulin glargine (LANTUS) 100 UNIT/ML injection Inject 25 Units into the skin at bedtime.      . insulin lispro (HUMALOG) 100 UNIT/ML injection Inject 5-17 Units into the skin 3 (three) times daily before meals.      Marland Kitchen levothyroxine (SYNTHROID, LEVOTHROID) 75 MCG tablet Take 1 tablet (75 mcg total) by mouth daily.  30 tablet  11  . lisinopril (PRINIVIL,ZESTRIL) 20 MG tablet Take 1 tablet (20 mg total) by mouth 2 (two) times daily.  60 tablet  5  . loperamide (IMODIUM A-D) 2 MG tablet Take 2 mg by mouth 4 (four) times daily as needed. Non Formula Anti Diarrheal      . metoprolol (TOPROL-XL) 200 MG 24 hr tablet Take 1 tablet (200 mg total) by mouth daily.  30 tablet  11  . Multiple Vitamins-Minerals (CENTRUM SILVER PO) Take 1 tablet by mouth daily.        Marland Kitchen omeprazole (PRILOSEC) 20 MG capsule Take 1 capsule (20 mg total) by mouth daily.  30 capsule  11  . Pancrelipase, Lip-Prot-Amyl, 5000 UNITS CPEP Take 3 capsules (15,000 Units total) by mouth 3 (three) times daily.  270 capsule  3  . pravastatin (PRAVACHOL) 20 MG tablet Take 1 tablet (20 mg total) by mouth daily.  30 tablet  11  . Syringe/Needle, Disp, (B-D ECLIPSE SYRINGE) 30G X 1/2" 1 ML MISC as directed.  100 each  6  . triamcinolone cream (KENALOG) 0.1 % Apply topically 2 (two) times daily.  30 g  0   No facility-administered encounter medications on file as of 10/11/2012.   BP 150/90   Pulse 69  Temp(Src) 98 F (36.7 C) (Oral)  Ht 4' 9.5" (1.461 m)  Wt 170 lb (77.111 kg)  BMI 36.13 kg/m2  SpO2 97%  Review of Systems  Constitutional: Negative for fever, chills, appetite change, fatigue and unexpected weight change.  HENT: Negative for ear pain, congestion, sore throat, trouble swallowing, neck pain, voice change and sinus pressure.   Eyes: Negative for visual disturbance.  Respiratory: Negative for cough, shortness of breath, wheezing and stridor.   Cardiovascular: Negative for chest pain, palpitations and leg swelling.  Gastrointestinal: Negative for nausea, vomiting, abdominal pain, diarrhea, constipation, blood in stool, abdominal distention and anal bleeding.  Genitourinary: Negative for dysuria and flank pain.  Musculoskeletal: Positive for arthralgias. Negative for myalgias and gait problem.  Skin: Negative for color change and rash.  Neurological: Negative for dizziness and headaches.  Hematological: Negative for adenopathy. Does not bruise/bleed easily.  Psychiatric/Behavioral: Negative for suicidal ideas, sleep disturbance and dysphoric mood. The patient is not nervous/anxious.        Objective:   Physical Exam  Constitutional: She is oriented to person, place, and time. She appears well-developed and well-nourished. No distress.  HENT:  Head: Normocephalic and atraumatic.  Right Ear: External ear normal.  Left Ear: External ear normal.  Nose: Nose normal.  Mouth/Throat: Oropharynx is clear and moist. No oropharyngeal exudate.  Eyes: Conjunctivae are normal. Pupils are equal, round, and reactive to light. Right eye exhibits no discharge. Left eye exhibits no discharge. No scleral icterus.  Neck: Normal range of motion. Neck supple. No tracheal deviation present. No thyromegaly present.  Cardiovascular: Normal rate, regular rhythm, normal heart sounds and intact distal pulses.  Exam reveals no gallop and no friction rub.   No murmur  heard. Pulmonary/Chest: Effort normal and breath sounds normal. No respiratory distress. She has no wheezes. She has no rales. She exhibits no tenderness.  Abdominal:    Musculoskeletal: Normal range of motion. She exhibits no edema and no tenderness.       Left knee: She exhibits normal range of motion and no swelling.  Lymphadenopathy:    She has no cervical adenopathy.  Neurological: She is alert and oriented to person, place, and time. No cranial nerve deficit. She exhibits normal muscle tone. Coordination normal.  Skin: Skin is warm and dry. Rash (maculopapular rash over forehead) noted. Rash is maculopapular. She is not diaphoretic. There is erythema. No pallor.  Psychiatric: She has a normal mood and affect. Her behavior is normal. Judgment and thought content normal.          Assessment & Plan:

## 2012-10-20 ENCOUNTER — Encounter: Payer: Self-pay | Admitting: Nurse Practitioner

## 2012-10-20 ENCOUNTER — Encounter: Payer: Self-pay | Admitting: Cardiothoracic Surgery

## 2012-10-22 ENCOUNTER — Other Ambulatory Visit: Payer: Self-pay | Admitting: *Deleted

## 2012-10-22 MED ORDER — ALPRAZOLAM 1 MG PO TABS: ORAL_TABLET | ORAL | Status: DC

## 2012-10-22 NOTE — Telephone Encounter (Signed)
Is there a medication request? I could not find what they were requesting be refilled. [name]

## 2012-10-30 ENCOUNTER — Other Ambulatory Visit: Payer: Self-pay | Admitting: Adult Health

## 2012-10-30 NOTE — Telephone Encounter (Signed)
Patient needs refills on her Xanax and Vicodin, she will run out today. If you need additional information you may call [name] from [facility]. Please send to [facility] pharmacy

## 2012-10-31 ENCOUNTER — Telehealth: Payer: Self-pay | Admitting: *Deleted

## 2012-10-31 NOTE — Telephone Encounter (Signed)
Error

## 2012-11-01 ENCOUNTER — Telehealth: Payer: Self-pay | Admitting: Adult Health

## 2012-11-01 MED ORDER — ALPRAZOLAM 1 MG PO TABS: ORAL_TABLET | ORAL | Status: DC

## 2012-11-01 NOTE — Telephone Encounter (Signed)
She needs to be seen.

## 2012-11-01 NOTE — Telephone Encounter (Signed)
Spoke with [name] in reference to refill on Xanax and please disregard note about refill on antibiotics, incorrect patient information.

## 2012-11-01 NOTE — Telephone Encounter (Signed)
Rx printed, signed and faxed to [facility]

## 2012-11-01 NOTE — Telephone Encounter (Signed)
Needs to be seen, but fine to refill xanax.

## 2012-11-01 NOTE — Telephone Encounter (Signed)
[name], the nurse for [name] left a message stating patient needs a refill on her Xanax and also another round of abx. Patient has been taking Augmentin for lower respiratory infection and she thinks she need another round because her lungs still sound kind of crackly but the cough production is clear now. Last time she had an infection like this, it took 3 rounds to clear up.

## 2012-11-02 ENCOUNTER — Other Ambulatory Visit: Payer: Self-pay | Admitting: *Deleted

## 2012-11-02 NOTE — Telephone Encounter (Signed)
Dr. [name],   The patient is requesting a refill on Xanax and Vicodin. You and Raquel have both seen this patient and that's why I am routing this to you. Please advise

## 2012-11-09 ENCOUNTER — Ambulatory Visit (INDEPENDENT_AMBULATORY_CARE_PROVIDER_SITE_OTHER): Payer: Medicare Other | Admitting: Adult Health

## 2012-11-09 ENCOUNTER — Encounter: Payer: Self-pay | Admitting: Adult Health

## 2012-11-09 VITALS — BP 183/84 | HR 62 | Temp 98.0°F | Resp 14 | Wt 171.0 lb

## 2012-11-09 DIAGNOSIS — L259 Unspecified contact dermatitis, unspecified cause: Secondary | ICD-10-CM

## 2012-11-09 DIAGNOSIS — I10 Essential (primary) hypertension: Secondary | ICD-10-CM

## 2012-11-09 DIAGNOSIS — L309 Dermatitis, unspecified: Secondary | ICD-10-CM

## 2012-11-09 MED ORDER — CARBIDOPA-LEVODOPA CR 25-100 MG PO TBCR: 1.0000 | EXTENDED_RELEASE_TABLET | Freq: Three times a day (TID) | ORAL | Status: DC

## 2012-11-09 MED ORDER — FLUTICASONE PROPIONATE 50 MCG/ACT NA SUSP: 2.0000 | Freq: Every day | NASAL | Status: DC

## 2012-11-09 MED ORDER — AMLODIPINE BESYLATE 2.5 MG PO TABS: 2.5000 mg | ORAL_TABLET | Freq: Every day | ORAL | Status: DC

## 2012-11-09 MED ORDER — HYDROCODONE-ACETAMINOPHEN 5-325 MG PO TABS: 1.0000 | ORAL_TABLET | Freq: Three times a day (TID) | ORAL | Status: DC | PRN

## 2012-11-09 NOTE — Progress Notes (Signed)
Subjective:    Patient ID: [name], female    DOB: 1929/08/06, 79 y.o.   MRN: [id number]  HPI  Patient is a pleasant 79 y/o female with multiple medical problems including COPD, TIA, HTN, HLD, DM type II, Chronic diastolic HF, GERD, hypothyroidism, tremor, OA, depression, anxiety, RLS,  hx of necrotizing pancreatitis s/p pancreatectomy (2004) with non-healing surgical wound (followed at the wound clinic) who presents for follow up of rash on her forehead and hypertension. She reports feeling much better. She states that her blood pressures have not been as high as today's readings.    Current Outpatient Prescriptions on File Prior to Visit  Medication Sig Dispense Refill  . ALPRAZolam (XANAX) 1 MG tablet Take 1/2 tab in am, 1/2 tab at lunch and 1 tab at bedtime as needed  60 tablet  0  . aspirin 81 MG tablet Take 81 mg by mouth daily.        . busPIRone (BUSPAR) 10 MG tablet Take 1 tablet (10 mg total) by mouth 2 (two) times daily.  60 tablet  3  . docusate sodium (COLACE) 100 MG capsule Take 100 mg by mouth daily as needed.      . furosemide (LASIX) 20 MG tablet Take 1 tablet (20 mg total) by mouth daily.  30 tablet  11  . Glucosamine-Chondroitin (OSTEO BI-FLEX REGULAR STRENGTH PO) Take 1 tablet by mouth daily.        . insulin glargine (LANTUS) 100 UNIT/ML injection Inject 25 Units into the skin at bedtime.      . insulin lispro (HUMALOG) 100 UNIT/ML injection Inject 5-17 Units into the skin 3 (three) times daily before meals.      Marland Kitchen levothyroxine (SYNTHROID, LEVOTHROID) 75 MCG tablet Take 1 tablet (75 mcg total) by mouth daily.  30 tablet  11  . lisinopril (PRINIVIL,ZESTRIL) 20 MG tablet Take 1 tablet (20 mg total) by mouth 2 (two) times daily.  60 tablet  5  . loperamide (IMODIUM A-D) 2 MG tablet Take 2 mg by mouth 4 (four) times daily as needed. Non Formula Anti Diarrheal      . metoprolol (TOPROL-XL) 200 MG 24 hr tablet Take 1 tablet (200 mg total) by mouth daily.  30 tablet  11  .  Multiple Vitamins-Minerals (CENTRUM SILVER PO) Take 1 tablet by mouth daily.        Marland Kitchen omeprazole (PRILOSEC) 20 MG capsule Take 1 capsule (20 mg total) by mouth daily.  30 capsule  11  . ondansetron (ZOFRAN) 4 MG tablet Take 1 tablet (4 mg total) by mouth every 8 (eight) hours as needed.  30 tablet  0  . Pancrelipase, Lip-Prot-Amyl, 5000 UNITS CPEP Take 3 capsules (15,000 Units total) by mouth 3 (three) times daily.  270 capsule  3  . pravastatin (PRAVACHOL) 20 MG tablet Take 1 tablet (20 mg total) by mouth daily.  30 tablet  11  . Syringe/Needle, Disp, (B-D ECLIPSE SYRINGE) 30G X 1/2" 1 ML MISC as directed.  100 each  6  . triamcinolone cream (KENALOG) 0.1 % Apply topically 2 (two) times daily.  30 g  0   No current facility-administered medications on file prior to visit.     Review of Systems  Constitutional: Negative for fever, chills and appetite change.  Respiratory: Negative.  Negative for cough, shortness of breath and wheezing.   Cardiovascular: Negative.  Negative for chest pain.  Skin: Positive for rash.       On forehead improved.  She is still using triamcinolone cream.  Neurological: Negative for dizziness, speech difficulty, light-headedness and headaches.    BP 183/84  Pulse 62  Temp(Src) 98 F (36.7 C) (Oral)  Resp 14  Wt 171 lb (77.565 kg)  BMI 36.34 kg/m2  SpO2 96%     Objective:   Physical Exam  Constitutional: She is oriented to person, place, and time. She appears well-developed and well-nourished. No distress.  HENT:  Head: Normocephalic and atraumatic.  Right Ear: External ear normal.  Left Ear: External ear normal.  Cardiovascular: Normal rate, regular rhythm, normal heart sounds and intact distal pulses.  Exam reveals no gallop.   No murmur heard. Pulmonary/Chest: Effort normal and breath sounds normal. No respiratory distress. She has no wheezes. She has no rales. She exhibits no tenderness.  Neurological: She is alert and oriented to person, place,  and time.  Skin: Skin is warm and dry. Rash noted.  Small area on forehead which is dry, scaly.  Psychiatric: She has a normal mood and affect. Her behavior is normal. Judgment and thought content normal.          Assessment & Plan:

## 2012-11-09 NOTE — Patient Instructions (Addendum)
  I have ordered another medication for your blood pressure. Please take amlodipine 2.5 mg daily.   Continue to monitor your blood pressure.  For your ears you can use Debrox drops which are sold over the counter.  I have sent in your refills to your pharmacy

## 2012-11-11 NOTE — Assessment & Plan Note (Signed)
Add amlodipine 2.5mg  daily. [facility] following.  RTC for f/u in 1 month.

## 2012-11-11 NOTE — Assessment & Plan Note (Signed)
Improved. Continue to use triamcinolone cream to the area.

## 2012-11-12 DIAGNOSIS — T8189XA Other complications of procedures, not elsewhere classified, initial encounter: Secondary | ICD-10-CM

## 2012-11-12 DIAGNOSIS — E119 Type 2 diabetes mellitus without complications: Secondary | ICD-10-CM

## 2012-11-12 DIAGNOSIS — I10 Essential (primary) hypertension: Secondary | ICD-10-CM

## 2012-11-14 ENCOUNTER — Encounter: Payer: Self-pay | Admitting: Adult Health

## 2012-11-19 ENCOUNTER — Other Ambulatory Visit: Payer: Self-pay | Admitting: Adult Health

## 2012-11-19 ENCOUNTER — Other Ambulatory Visit: Payer: Self-pay | Admitting: *Deleted

## 2012-11-19 ENCOUNTER — Inpatient Hospital Stay: Payer: Self-pay | Admitting: Family Medicine

## 2012-11-19 LAB — COMPREHENSIVE METABOLIC PANEL
Alkaline Phosphatase: 109 U/L (ref 50–136)
BUN: 15 mg/dL (ref 7–18)
Bilirubin,Total: 0.2 mg/dL (ref 0.2–1.0)
Calcium, Total: 8.4 mg/dL — ABNORMAL LOW (ref 8.5–10.1)
Chloride: 110 mmol/L — ABNORMAL HIGH (ref 98–107)
Co2: 25 mmol/L (ref 21–32)
Creatinine: 1.17 mg/dL (ref 0.60–1.30)
EGFR (Non-African Amer.): 44 — ABNORMAL LOW
Glucose: 148 mg/dL — ABNORMAL HIGH (ref 65–99)
Osmolality: 285 (ref 275–301)
Sodium: 141 mmol/L (ref 136–145)
Total Protein: 6.7 g/dL (ref 6.4–8.2)

## 2012-11-19 LAB — CBC WITH DIFFERENTIAL/PLATELET
Eosinophil #: 0.2 10*3/uL (ref 0.0–0.7)
HGB: 13.1 g/dL (ref 12.0–16.0)
Lymphocyte #: 1.5 10*3/uL (ref 1.0–3.6)
MCH: 30.4 pg (ref 26.0–34.0)
MCHC: 34.3 g/dL (ref 32.0–36.0)
MCV: 89 fL (ref 80–100)
Monocyte %: 7.5 %
Neutrophil #: 4.4 10*3/uL (ref 1.4–6.5)
RDW: 14.4 % (ref 11.5–14.5)
WBC: 6.7 10*3/uL (ref 3.6–11.0)

## 2012-11-19 LAB — TROPONIN I: Troponin-I: <0.02 ng/mL

## 2012-11-19 NOTE — Telephone Encounter (Signed)
Check with [facility] when this was last refilled. I think we did this 11/01/12.

## 2012-11-19 NOTE — Telephone Encounter (Signed)
Can one of you please call [facility] and find out when this was last refilled? Our records show 11/01/12 and it was a month supply. She should not be out of this yet.  Kimila Papaleo

## 2012-11-20 ENCOUNTER — Encounter: Payer: Self-pay | Admitting: Nurse Practitioner

## 2012-11-20 ENCOUNTER — Other Ambulatory Visit: Payer: Self-pay | Admitting: *Deleted

## 2012-11-20 LAB — CBC WITH DIFFERENTIAL/PLATELET
Basophil #: 0 10*3/uL (ref 0.0–0.1)
Eosinophil %: 2 %
HCT: 36.8 % (ref 35.0–47.0)
Lymphocyte #: 1.5 10*3/uL (ref 1.0–3.6)
Lymphocyte %: 24.7 %
MCH: 29.8 pg (ref 26.0–34.0)
MCHC: 33.6 g/dL (ref 32.0–36.0)
MCV: 89 fL (ref 80–100)
Monocyte %: 9.2 %
Neutrophil %: 63.7 %
Platelet: 115 10*3/uL — ABNORMAL LOW (ref 150–440)
RBC: 4.15 10*6/uL (ref 3.80–5.20)
RDW: 14.2 % (ref 11.5–14.5)
WBC: 6 10*3/uL (ref 3.6–11.0)

## 2012-11-20 LAB — LIPID PANEL
HDL Cholesterol: 34 mg/dL — ABNORMAL LOW (ref 40–60)
Triglycerides: 192 mg/dL (ref 0–200)
VLDL Cholesterol, Calc: 38 mg/dL (ref 5–40)

## 2012-11-20 LAB — BASIC METABOLIC PANEL
Anion Gap: 6 — ABNORMAL LOW (ref 7–16)
Calcium, Total: 8.4 mg/dL — ABNORMAL LOW (ref 8.5–10.1)
EGFR (African American): 56 — ABNORMAL LOW
EGFR (Non-African Amer.): 48 — ABNORMAL LOW
Glucose: 236 mg/dL — ABNORMAL HIGH (ref 65–99)
Osmolality: 288 (ref 275–301)
Potassium: 4 mmol/L (ref 3.5–5.1)

## 2012-11-20 LAB — TSH: Thyroid Stimulating Horm: 2.05 u[IU]/mL

## 2012-11-20 LAB — CK-MB: CK-MB: <0.5 ng/mL — ABNORMAL LOW (ref 0.5–3.6)

## 2012-11-20 NOTE — Telephone Encounter (Signed)
Raquel,  I spoke with the pharmacists and the script was filled on 11/01/12. It hasn't been a month.  Asher Muir

## 2012-11-20 NOTE — Telephone Encounter (Signed)
Called pharmacist and informed them that it was too soon to be refilled

## 2012-11-21 ENCOUNTER — Telehealth: Payer: Self-pay | Admitting: General Practice

## 2012-11-21 LAB — BASIC METABOLIC PANEL
BUN: 22 mg/dL — ABNORMAL HIGH (ref 7–18)
Calcium, Total: 8.3 mg/dL — ABNORMAL LOW (ref 8.5–10.1)
Creatinine: 1.59 mg/dL — ABNORMAL HIGH (ref 0.60–1.30)
EGFR (Non-African Amer.): 30 — ABNORMAL LOW
Glucose: 237 mg/dL — ABNORMAL HIGH (ref 65–99)
Osmolality: 290 (ref 275–301)

## 2012-11-21 LAB — CBC WITH DIFFERENTIAL/PLATELET
Basophil #: 0 10*3/uL (ref 0.0–0.1)
Basophil %: 0.3 %
HCT: 35.6 % (ref 35.0–47.0)
Lymphocyte #: 1.8 10*3/uL (ref 1.0–3.6)
Lymphocyte %: 30.5 %
MCH: 30.1 pg (ref 26.0–34.0)
MCHC: 33.7 g/dL (ref 32.0–36.0)
Monocyte #: 0.6 x10 3/mm (ref 0.2–0.9)
Platelet: 111 10*3/uL — ABNORMAL LOW (ref 150–440)
WBC: 5.9 10*3/uL (ref 3.6–11.0)

## 2012-11-21 NOTE — Telephone Encounter (Signed)
Lillia Abed would like a call back  in regards to a stool culture that was collected on patient last week. She asked to speak with Dr. Tilman Neat nurse but it looks like she seen Raquel.

## 2012-11-22 LAB — BASIC METABOLIC PANEL
Anion Gap: 7 (ref 7–16)
BUN: 29 mg/dL — ABNORMAL HIGH (ref 7–18)
Calcium, Total: 8.3 mg/dL — ABNORMAL LOW (ref 8.5–10.1)
Co2: 29 mmol/L (ref 21–32)
Creatinine: 1.81 mg/dL — ABNORMAL HIGH (ref 0.60–1.30)
EGFR (Non-African Amer.): 26 — ABNORMAL LOW
Osmolality: 287 (ref 275–301)

## 2012-11-22 LAB — CBC WITH DIFFERENTIAL/PLATELET
Eosinophil %: 3.4 %
HCT: 35.5 % (ref 35.0–47.0)
HGB: 11.8 g/dL — ABNORMAL LOW (ref 12.0–16.0)
Lymphocyte #: 1.8 10*3/uL (ref 1.0–3.6)
Lymphocyte %: 28.9 %
MCH: 29.6 pg (ref 26.0–34.0)
MCHC: 33.3 g/dL (ref 32.0–36.0)
Monocyte #: 0.5 x10 3/mm (ref 0.2–0.9)
Monocyte %: 8.7 %
Neutrophil #: 3.6 10*3/uL (ref 1.4–6.5)
Neutrophil %: 58.7 %
Platelet: 112 10*3/uL — ABNORMAL LOW (ref 150–440)
RBC: 4 10*6/uL (ref 3.80–5.20)
RDW: 14.3 % (ref 11.5–14.5)
WBC: 6.1 10*3/uL (ref 3.6–11.0)

## 2012-11-23 ENCOUNTER — Telehealth: Payer: Self-pay | Admitting: Internal Medicine

## 2012-11-23 LAB — BASIC METABOLIC PANEL
Anion Gap: 6 — ABNORMAL LOW (ref 7–16)
BUN: 26 mg/dL — ABNORMAL HIGH (ref 7–18)
Co2: 29 mmol/L (ref 21–32)
Creatinine: 1.35 mg/dL — ABNORMAL HIGH (ref 0.60–1.30)
EGFR (African American): 43 — ABNORMAL LOW
Glucose: 271 mg/dL — ABNORMAL HIGH (ref 65–99)
Potassium: 4.4 mmol/L (ref 3.5–5.1)
Sodium: 137 mmol/L (ref 136–145)

## 2012-11-23 NOTE — Telephone Encounter (Signed)
We need to make contact with her today and make sure that she is doing well and document this. Then, she needs a follow up within the next 7 days. I am out next week, so may need to be with Raquel.

## 2012-11-23 NOTE — Telephone Encounter (Signed)
Debra from [facility] called informing us that [name] was discharged from [facility] and she will be resumimg HH services with PT

## 2012-11-23 NOTE — Telephone Encounter (Signed)
Left message for patient to call back, she already has an appointment with Raquel on 4/11

## 2012-11-24 ENCOUNTER — Encounter: Payer: Self-pay | Admitting: Cardiothoracic Surgery

## 2012-11-26 ENCOUNTER — Other Ambulatory Visit: Payer: Self-pay | Admitting: *Deleted

## 2012-11-26 MED ORDER — HYDROCODONE-ACETAMINOPHEN 5-325 MG PO TABS: 1.0000 | ORAL_TABLET | Freq: Three times a day (TID) | ORAL | Status: DC | PRN

## 2012-11-26 MED ORDER — ALPRAZOLAM 1 MG PO TABS: ORAL_TABLET | ORAL | Status: DC

## 2012-11-26 NOTE — Telephone Encounter (Signed)
Both scripts filled

## 2012-11-26 NOTE — Telephone Encounter (Signed)
Please refill both prescriptions for [name]

## 2012-11-26 NOTE — Telephone Encounter (Signed)
Irven Baltimore, [facility] Nurse for patient called requesting scripts for Vicodin and xanax. She also wanted to inform us that her diarrhea has cleared up. Her number is [phone]

## 2012-11-26 NOTE — Telephone Encounter (Signed)
Spoke with patient and she states she is doing well. She is aware of home health with physical therapy.

## 2012-11-30 ENCOUNTER — Ambulatory Visit (INDEPENDENT_AMBULATORY_CARE_PROVIDER_SITE_OTHER): Payer: Medicare Other | Admitting: Adult Health

## 2012-11-30 ENCOUNTER — Encounter: Payer: Self-pay | Admitting: Adult Health

## 2012-11-30 ENCOUNTER — Ambulatory Visit: Payer: Medicare Other | Admitting: Adult Health

## 2012-11-30 VITALS — BP 128/78 | HR 63 | Temp 98.5°F | Resp 16

## 2012-11-30 DIAGNOSIS — Z09 Encounter for follow-up examination after completed treatment for conditions other than malignant neoplasm: Secondary | ICD-10-CM

## 2012-11-30 NOTE — Assessment & Plan Note (Addendum)
1) CHF exacerbation - patient is doing very well. She was diuresed during hospitalization and responded well. Kidney function decreased so her lasix has been on hold until Nephrologist clears. ACE is also on hold. No need for oxygen at present. Continue metoprolol and amlodipine. Daily weights. Report 2 lb weight gain overnight or 5 lb in 1 week. 2) Acute on chronic renal failure - patient saw nephrologist yesterday. Awaiting whether she needs to restart Lasix and ACE. Hold for now. 3) Bronchitis - clear with levaquin. Patient without any s/s 4) DM - Lantus dose increased to 27 units at bedtime. F/U appt with endocrinologist today.  Follow up appointment in 3 months for HTN, HLD, diastolic heart failure

## 2012-11-30 NOTE — Patient Instructions (Addendum)
  You are doing well.  Your blood pressure is very good during today's visit.  Continue to hold your lasix until your kidney specialist tells you to resume medication  Daily weight - report 2 lb weight gain overnight or 5 lbs in 1 week.

## 2012-11-30 NOTE — Progress Notes (Signed)
  Subjective:    Patient ID: [name], female    DOB: 02-22-1929, 79 y.o.   MRN: [id number]  HPI  Patient is a pleasant 79 year old female with multiple medical problems including hypertension, hyperlipidemia, diabetes insulin-dependent, chronic abdominal wound, thrombocytopenia, chronic kidney disease, who presents to clinic for followup recent hospitalization. Patient was admitted from 11/19/12 - 4/414 with 4 days of diarrhea. Patient had stopped taking Lasix secondary to feeling she was dehydrated. Subsequently she developed shortness of breath and increase in weight of 2 pounds. She was admitted for acute exacerbation of CHF. Patient is status post echo which shows ejection fraction of 55% to 60%. Patient was also found to be in acute on chronic renal failure with creatinine bumping up to 1.8. Her ACE and Lasix were placed on hold. Patient was also treated with Levaquin for acute bronchitis. Diabetes remained controlled during hospitalization her discharge summary.  Patient had a followup appointment with her nephrologist, Dr. Wynelle Link, yesterday. Labs were drawn and patient was asked to continue to hold her Lasix until further notice. She also went to the wound clinic yesterday with reports of wound being stable. Patient saw her endocrinologist today and her bedtime Lantus dose was increased to 27 units.   [name] is overall feeling improved and in good spirits today.   Review of Systems  Constitutional: Negative for fever and chills.  HENT: Negative.   Eyes: Negative.   Respiratory: Negative for cough, chest tightness, shortness of breath and wheezing.   Cardiovascular: Negative for chest pain and leg swelling.  Gastrointestinal:       Abdominal wound - followed at wound clinic  Endocrine: Negative.   Genitourinary: Negative for dysuria, hematuria, flank pain, difficulty urinating and pelvic pain.  Musculoskeletal:       Uses walker to ambulate.   Skin:       Abdominal wound -  necrotizing pancreatitis s/p pancreatectomy with chronic abdominal wound  Allergic/Immunologic: Negative.   Neurological: Negative for dizziness, tremors, light-headedness and headaches.  Hematological: Negative.   Psychiatric/Behavioral: Negative for behavioral problems, confusion and agitation. The patient is not nervous/anxious.    BP 128/78  Pulse 63  Temp(Src) 98.5 F (36.9 C) (Oral)  Resp 16  SpO2 97%     Objective:   Physical Exam  Constitutional: She is oriented to person, place, and time. No distress.  Overweight, elderly female in NAD  HENT:  Head: Normocephalic and atraumatic.  Eyes: Pupils are equal, round, and reactive to light.  Cardiovascular: Normal rate, regular rhythm and normal heart sounds.   Pulmonary/Chest: Effort normal and breath sounds normal. No respiratory distress. She has no wheezes. She has no rales.  Abdominal: Soft. Bowel sounds are normal.  Chronic wound with dressing in place. Patient is s/p visit to the wound clinic for treatment yesterday.  Musculoskeletal: Normal range of motion. She exhibits edema.  Trace edema bilateral LE  Neurological: She is alert and oriented to person, place, and time.  Skin: Skin is warm and dry.  Psychiatric: She has a normal mood and affect. Her behavior is normal. Judgment and thought content normal.       Assessment & Plan:

## 2012-12-05 ENCOUNTER — Telehealth: Payer: Self-pay | Admitting: Internal Medicine

## 2012-12-05 NOTE — Telephone Encounter (Signed)
Alex from [employer] left VM, checking on status for pt's Rx. Would like a call back on status.

## 2012-12-05 NOTE — Telephone Encounter (Signed)
Faxed form from [employer] for back brace put in physician's box.

## 2012-12-05 NOTE — Telephone Encounter (Signed)
Have you received anything from [employer]?

## 2012-12-05 NOTE — Telephone Encounter (Signed)
We receive almost daily faxes requesting a variety of devices for this patient including lumbar orthosis support. We have previously discussed with the patient and she denies requesting any of this equipment. We have requested that the company stop faxing [place] the request on multiple occasions.

## 2012-12-06 NOTE — Telephone Encounter (Signed)
Number provided was a fax number, faxed a letter to the company to stop sending these request as the patient has not requested any services from them.

## 2012-12-20 ENCOUNTER — Encounter: Payer: Self-pay | Admitting: Cardiothoracic Surgery

## 2012-12-20 ENCOUNTER — Encounter: Payer: Self-pay | Admitting: Nurse Practitioner

## 2012-12-22 DIAGNOSIS — Z09 Encounter for follow-up examination after completed treatment for conditions other than malignant neoplasm: Secondary | ICD-10-CM

## 2012-12-25 ENCOUNTER — Other Ambulatory Visit: Payer: Self-pay | Admitting: *Deleted

## 2012-12-26 MED ORDER — ALPRAZOLAM 1 MG PO TABS: ORAL_TABLET | ORAL | Status: DC

## 2012-12-31 ENCOUNTER — Other Ambulatory Visit: Payer: Self-pay | Admitting: Internal Medicine

## 2012-12-31 MED ORDER — HYDROCODONE-ACETAMINOPHEN 5-325 MG PO TABS: 1.0000 | ORAL_TABLET | Freq: Three times a day (TID) | ORAL | Status: DC | PRN

## 2012-12-31 NOTE — Telephone Encounter (Signed)
Spoke with [name] and she stated the patient is taking them as directed up to 3 times day. What is she to do until she is seen by the pain clinic?

## 2012-12-31 NOTE — Telephone Encounter (Signed)
We can call in 1 month refill then will need to set up care at [facility] pain clinic.

## 2012-12-31 NOTE — Telephone Encounter (Signed)
She is using an excessive amount of pain medication, as this was just filled 4/7 for 90 tablets. I would recommend referral to the pain clinic if symptoms require this much narcotic.

## 2012-12-31 NOTE — Telephone Encounter (Signed)
Patient is completely out of her   HYDROcodone-acetaminophen (NORCO/VICODIN) 5-325 MG per tablet and ALPRAZolam (XANAX) 1 MG tablet

## 2012-12-31 NOTE — Addendum Note (Signed)
Addended by: Theola Sequin on: 12/31/2012 02:20 PM   Modules accepted: Orders

## 2012-12-31 NOTE — Telephone Encounter (Signed)
Prescription was called in to pharmacy for Hydrocodone and Alprazolam for 1 month supply. Patient daughter aware she will be referred to pain clinic

## 2013-01-11 ENCOUNTER — Telehealth: Payer: Self-pay | Admitting: *Deleted

## 2013-01-11 NOTE — Telephone Encounter (Signed)
Informed patient daughter in law and she will call back next week to schedule because she does not have her calendar in front of her.

## 2013-01-11 NOTE — Telephone Encounter (Signed)
Pt has NEVER requested this from me and has told us in the past that she DID NOT request any medical devices. Companies send forms on this daily. She will need appointment to discuss.

## 2013-01-11 NOTE — Telephone Encounter (Signed)
Patient daughter in law called stating they received a phone call from the pain clinic and they said they can not see her because of her age. The daughter in law would like to know what to do, try an alternative medication or something. Also she stated patient did not agree to the knee brace and no paper work should be filled out.

## 2013-01-11 NOTE — Telephone Encounter (Signed)
Patient is requesting knee brace and would like to know if paperwork has been filled out. Medical supply store called and said patient is interested in receiving this.  Nurse Dannette Barbara [phone] F [phone]

## 2013-01-11 NOTE — Telephone Encounter (Signed)
We should see her in a follow up for pain management.

## 2013-01-15 ENCOUNTER — Telehealth: Payer: Self-pay | Admitting: Internal Medicine

## 2013-01-15 NOTE — Telephone Encounter (Signed)
[name] called wanting to make an appointment with dr walker for ? On meds.  First appointment with dr walker was 6/16 angela stated ms lloyed would be out of meds by then.  i offered raquel but angela stated that she was told over the phone that she needed to see dr walker.  Please advise appointment time  thanks

## 2013-01-15 NOTE — Telephone Encounter (Signed)
Spoke to [employer] of appointment

## 2013-01-15 NOTE — Telephone Encounter (Signed)
You can use an acute slot for Thursday or Friday.

## 2013-01-18 ENCOUNTER — Telehealth: Payer: Self-pay | Admitting: Internal Medicine

## 2013-01-18 ENCOUNTER — Ambulatory Visit (INDEPENDENT_AMBULATORY_CARE_PROVIDER_SITE_OTHER): Payer: Medicare Other | Admitting: Internal Medicine

## 2013-01-18 ENCOUNTER — Encounter: Payer: Self-pay | Admitting: Internal Medicine

## 2013-01-18 VITALS — BP 128/90 | HR 65 | Temp 98.2°F | Wt 170.0 lb

## 2013-01-18 DIAGNOSIS — T8131XA Disruption of external operation (surgical) wound, not elsewhere classified, initial encounter: Secondary | ICD-10-CM

## 2013-01-18 DIAGNOSIS — IMO0001 Reserved for inherently not codable concepts without codable children: Secondary | ICD-10-CM

## 2013-01-18 DIAGNOSIS — F411 Generalized anxiety disorder: Secondary | ICD-10-CM

## 2013-01-18 DIAGNOSIS — I10 Essential (primary) hypertension: Secondary | ICD-10-CM

## 2013-01-18 DIAGNOSIS — M1710 Unilateral primary osteoarthritis, unspecified knee: Secondary | ICD-10-CM

## 2013-01-18 DIAGNOSIS — I5032 Chronic diastolic (congestive) heart failure: Secondary | ICD-10-CM

## 2013-01-18 DIAGNOSIS — E119 Type 2 diabetes mellitus without complications: Secondary | ICD-10-CM

## 2013-01-18 MED ORDER — FUROSEMIDE 20 MG PO TABS: 20.0000 mg | ORAL_TABLET | Freq: Every day | ORAL | Status: DC

## 2013-01-18 MED ORDER — ONDANSETRON HCL 4 MG PO TABS: 4.0000 mg | ORAL_TABLET | Freq: Three times a day (TID) | ORAL | Status: DC | PRN

## 2013-01-18 MED ORDER — PANCRELIPASE (LIP-PROT-AMYL) 5000 UNITS PO CPEP: 3.0000 | ORAL_CAPSULE | Freq: Three times a day (TID) | ORAL | Status: DC

## 2013-01-18 MED ORDER — OMEPRAZOLE 20 MG PO CPDR: 20.0000 mg | DELAYED_RELEASE_CAPSULE | Freq: Every day | ORAL | Status: DC

## 2013-01-18 MED ORDER — HYDROCODONE-ACETAMINOPHEN 5-325 MG PO TABS: 1.0000 | ORAL_TABLET | Freq: Three times a day (TID) | ORAL | Status: DC | PRN

## 2013-01-18 MED ORDER — ALPRAZOLAM 1 MG PO TABS: ORAL_TABLET | ORAL | Status: DC

## 2013-01-18 MED ORDER — LEVOTHYROXINE SODIUM 75 MCG PO TABS: 75.0000 ug | ORAL_TABLET | Freq: Every day | ORAL | Status: DC

## 2013-01-18 MED ORDER — LISINOPRIL 20 MG PO TABS: 20.0000 mg | ORAL_TABLET | Freq: Two times a day (BID) | ORAL | Status: DC

## 2013-01-18 MED ORDER — PRAVASTATIN SODIUM 20 MG PO TABS: 20.0000 mg | ORAL_TABLET | Freq: Every day | ORAL | Status: DC

## 2013-01-18 NOTE — Assessment & Plan Note (Signed)
Chronic osteoarthritis in knees and spine leading to chronic pain. Attempt at referral to pain management for further evaluation. Referral was declined by pain management clinic. Will continue hydrocodone up to 3 times daily as needed for severe pain. Patient will call if symptoms are not well controlled.

## 2013-01-18 NOTE — Assessment & Plan Note (Signed)
Reexamined wound today. Large mid abdominal wound with a central area approximately 2 x 2 centimeters with healthy granulation tissue and minimal exudate. Will request notes from [facility].

## 2013-01-18 NOTE — Assessment & Plan Note (Signed)
Symptoms well controlled with BuSpar and alprazolam. We'll continue.

## 2013-01-18 NOTE — Assessment & Plan Note (Signed)
Last hemoglobin A1c 7.8% in January 2014. Will schedule repeat labs with A1c. Continue current medications.

## 2013-01-18 NOTE — Assessment & Plan Note (Signed)
Chronic diastolic heart failure. Appears euvolemic on exam today. Continue current medication.

## 2013-01-18 NOTE — Telephone Encounter (Signed)
I was looking through her chart. I cannot find that she has recently had her A1c checked? Was this checked by another provider? I have requested recent labs from Dr. Luther Redo, but I doubt he would check this. Can home health nurse send A1c?

## 2013-01-18 NOTE — Assessment & Plan Note (Signed)
BP Readings from Last 3 Encounters:  01/18/13 128/90  11/30/12 128/78  11/09/12 183/84   Blood pressure well-controlled on current medications. Will continue. Renal function recently checked by nephrologist. Will request records on this.

## 2013-01-18 NOTE — Progress Notes (Signed)
Subjective:    Patient ID: [name], female    DOB: 1928/09/03, 79 y.o.   MRN: [id number]  HPI 79 year old female with history of congestive heart failure, diabetes, osteoarthritis and chronic pain, pancreatic failure status post pancreatectomy, hypertension, anxiety presents for followup. She presents with her granddaughter and son. They report that she has been anxious lately because she has been out of her pain medication. For years, she has been taking hydrocodone/acetaminophen 5/325 mg 3 times daily. Pain was well controlled with this medication. We recently attempted to refer her to the pain clinic for further evaluation. They declined referral feeling that there were no interventions that could be made. She has been using her hydrocodone one to 2 times daily but having significant pain that is inhibiting her ability to function and sleep at night.  She continues to follow at the wound healing center. She has been told that she may need a skin graft to her central abdominal wound. This wound has been open for over 9 years. She denies any recent fever, chills, or pain at the wound site.  In regards to CHF, she reports no recent chest pain, dyspnea, palpitations. She does have mild dyspnea with minimal exertion, which is stable.  Outpatient Encounter Prescriptions as of 01/18/2013  Medication Sig Dispense Refill  . ALPRAZolam (XANAX) 1 MG tablet Take 1/2 tab in am, 1/2 tab at lunch and 1 tab at bedtime as needed  60 tablet  4  . amLODipine (NORVASC) 2.5 MG tablet Take 1 tablet (2.5 mg total) by mouth daily.  30 tablet  3  . aspirin 81 MG tablet Take 81 mg by mouth daily.        . busPIRone (BUSPAR) 10 MG tablet Take 1 tablet (10 mg total) by mouth 2 (two) times daily.  60 tablet  3  . carbidopa-levodopa (SINEMET CR) 25-100 MG per tablet Take 1 tablet by mouth 3 (three) times daily.  90 tablet  5  . docusate sodium (COLACE) 100 MG capsule Take 100 mg by mouth daily as needed.      .  fluticasone (FLONASE) 50 MCG/ACT nasal spray Place 2 sprays into the nose daily.  16 g  6  . furosemide (LASIX) 20 MG tablet Take 1 tablet (20 mg total) by mouth daily.  30 tablet  11  . Glucosamine-Chondroitin (OSTEO BI-FLEX REGULAR STRENGTH PO) Take 1 tablet by mouth daily.        Marland Kitchen HYDROcodone-acetaminophen (NORCO/VICODIN) 5-325 MG per tablet Take 1 tablet by mouth 3 (three) times daily as needed.  90 tablet  4  . insulin glargine (LANTUS) 100 UNIT/ML injection Inject 27 Units into the skin at bedtime.       . insulin lispro (HUMALOG) 100 UNIT/ML injection Inject 5-17 Units into the skin 3 (three) times daily before meals.      Marland Kitchen levothyroxine (SYNTHROID, LEVOTHROID) 75 MCG tablet Take 1 tablet (75 mcg total) by mouth daily.  30 tablet  11  . lisinopril (PRINIVIL,ZESTRIL) 20 MG tablet Take 1 tablet (20 mg total) by mouth 2 (two) times daily.  60 tablet  11  . loperamide (IMODIUM A-D) 2 MG tablet Take 2 mg by mouth 4 (four) times daily as needed. Non Formula Anti Diarrheal      . metoprolol (TOPROL-XL) 200 MG 24 hr tablet Take 1 tablet (200 mg total) by mouth daily.  30 tablet  11  . Multiple Vitamins-Minerals (CENTRUM SILVER PO) Take 1 tablet by mouth daily.        Marland Kitchen  omeprazole (PRILOSEC) 20 MG capsule Take 1 capsule (20 mg total) by mouth daily.  30 capsule  11  . Pancrelipase, Lip-Prot-Amyl, 5000 UNITS CPEP Take 3 capsules (15,000 Units total) by mouth 3 (three) times daily.  270 capsule  11  . pravastatin (PRAVACHOL) 20 MG tablet Take 1 tablet (20 mg total) by mouth daily.  30 tablet  11  . Syringe/Needle, Disp, (B-D ECLIPSE SYRINGE) 30G X 1/2" 1 ML MISC as directed.  100 each  6  . triamcinolone cream (KENALOG) 0.1 % Apply topically 2 (two) times daily.  30 g  0  . ondansetron (ZOFRAN) 4 MG tablet Take 1 tablet (4 mg total) by mouth every 8 (eight) hours as needed.  90 tablet  11  . predniSONE (DELTASONE) 10 MG tablet Take 10 mg by mouth daily.        No facility-administered encounter  medications on file as of 01/18/2013.   BP 128/90  Pulse 65  Temp(Src) 98.2 F (36.8 C) (Oral)  Wt 170 lb (77.111 kg)  BMI 36.13 kg/m2  SpO2 96%  Review of Systems  Constitutional: Negative for fever, chills, appetite change, fatigue and unexpected weight change.  HENT: Negative for ear pain, congestion, sore throat, trouble swallowing, neck pain, voice change and sinus pressure.   Eyes: Negative for visual disturbance.  Respiratory: Negative for cough, shortness of breath, wheezing and stridor.   Cardiovascular: Negative for chest pain, palpitations and leg swelling.  Gastrointestinal: Negative for nausea, vomiting, abdominal pain, diarrhea, constipation, blood in stool, abdominal distention and anal bleeding.  Genitourinary: Negative for dysuria and flank pain.  Musculoskeletal: Positive for myalgias, back pain and arthralgias. Negative for gait problem.  Skin: Positive for wound. Negative for color change and rash.  Neurological: Negative for dizziness and headaches.  Hematological: Negative for adenopathy. Does not bruise/bleed easily.  Psychiatric/Behavioral: Negative for suicidal ideas, sleep disturbance and dysphoric mood. The patient is not nervous/anxious.        Objective:   Physical Exam  Constitutional: She is oriented to person, place, and time. She appears well-developed and well-nourished. No distress.  HENT:  Head: Normocephalic and atraumatic.  Right Ear: External ear normal.  Left Ear: External ear normal.  Nose: Nose normal.  Mouth/Throat: Oropharynx is clear and moist. No oropharyngeal exudate.  Eyes: Conjunctivae are normal. Pupils are equal, round, and reactive to light. Right eye exhibits no discharge. Left eye exhibits no discharge. No scleral icterus.  Neck: Normal range of motion. Neck supple. No tracheal deviation present. No thyromegaly present.  Cardiovascular: Normal rate, regular rhythm, normal heart sounds and intact distal pulses.  Exam reveals no  gallop and no friction rub.   No murmur heard. Pulmonary/Chest: Effort normal and breath sounds normal. No accessory muscle usage. Not tachypneic. No respiratory distress. She has no decreased breath sounds. She has no wheezes. She has no rhonchi. She has no rales. She exhibits no tenderness.  Abdominal: Soft.    Musculoskeletal: Normal range of motion. She exhibits no edema and no tenderness.  Lymphadenopathy:    She has no cervical adenopathy.  Neurological: She is alert and oriented to person, place, and time. No cranial nerve deficit. She exhibits normal muscle tone. Coordination normal.  Skin: Skin is warm and dry. No rash noted. She is not diaphoretic. No erythema. No pallor.  Psychiatric: She has a normal mood and affect. Her behavior is normal. Judgment and thought content normal.          Assessment & Plan:  Over of which >50% spent in face-to-face contact with patient discussing plan of care

## 2013-01-20 ENCOUNTER — Encounter: Payer: Self-pay | Admitting: Cardiothoracic Surgery

## 2013-01-20 ENCOUNTER — Encounter: Payer: Self-pay | Admitting: Nurse Practitioner

## 2013-01-22 ENCOUNTER — Encounter: Payer: Self-pay | Admitting: *Deleted

## 2013-01-22 NOTE — Telephone Encounter (Signed)
Sent information to home health agency for nurse to draw an A1c on patient then fax [place] the results.

## 2013-02-06 ENCOUNTER — Encounter: Payer: Self-pay | Admitting: Internal Medicine

## 2013-02-19 ENCOUNTER — Encounter: Payer: Self-pay | Admitting: Nurse Practitioner

## 2013-02-19 ENCOUNTER — Encounter: Payer: Self-pay | Admitting: Cardiothoracic Surgery

## 2013-03-12 ENCOUNTER — Other Ambulatory Visit: Payer: Self-pay | Admitting: *Deleted

## 2013-03-12 MED ORDER — AMLODIPINE BESYLATE 2.5 MG PO TABS: 2.5000 mg | ORAL_TABLET | Freq: Every day | ORAL | Status: DC

## 2013-03-12 NOTE — Telephone Encounter (Signed)
Eprescribed.

## 2013-03-14 ENCOUNTER — Encounter: Payer: Self-pay | Admitting: Internal Medicine

## 2013-03-14 ENCOUNTER — Ambulatory Visit (INDEPENDENT_AMBULATORY_CARE_PROVIDER_SITE_OTHER): Payer: Medicare Other | Admitting: Internal Medicine

## 2013-03-14 VITALS — BP 140/80 | HR 64 | Temp 98.4°F | Wt 175.0 lb

## 2013-03-14 DIAGNOSIS — R109 Unspecified abdominal pain: Secondary | ICD-10-CM | POA: Insufficient documentation

## 2013-03-14 DIAGNOSIS — I10 Essential (primary) hypertension: Secondary | ICD-10-CM

## 2013-03-14 DIAGNOSIS — E119 Type 2 diabetes mellitus without complications: Secondary | ICD-10-CM

## 2013-03-14 DIAGNOSIS — E785 Hyperlipidemia, unspecified: Secondary | ICD-10-CM

## 2013-03-14 NOTE — Progress Notes (Signed)
Subjective:    Patient ID: [name], female    DOB: 1929/03/28, 79 y.o.   MRN: [id number]  HPI 79 year old female with history of diabetes, hypertension, hypothyroidism, obesity, pancreatectomy presents for followup. She reports that she has generally been doing well. In regards to diabetes, blood sugars have been well controlled with most blood sugars between 80 and 150. She does have a few outliers near 200. Recent A1c was 6.8%.   She is concerned today about one-day history of abdominal pain. She reports she consumed both grapes and apples yesterday and after eating these foods she developed diffuse abdominal distention and pain. She has not had any vomiting but has felt nauseous. She has not had any bowel movements. She denies fever or chills. She has been able to tolerate liquids and 2 granola bars today.  Outpatient Encounter Prescriptions as of 03/14/2013  Medication Sig Dispense Refill  . ALPRAZolam (XANAX) 1 MG tablet Take 1/2 tab in am, 1/2 tab at lunch and 1 tab at bedtime as needed  60 tablet  4  . amLODipine (NORVASC) 2.5 MG tablet Take 1 tablet (2.5 mg total) by mouth daily.  30 tablet  3  . aspirin 81 MG tablet Take 81 mg by mouth daily.        . busPIRone (BUSPAR) 10 MG tablet Take 1 tablet (10 mg total) by mouth 2 (two) times daily.  60 tablet  3  . carbidopa-levodopa (SINEMET CR) 25-100 MG per tablet Take 1 tablet by mouth 3 (three) times daily.  90 tablet  5  . docusate sodium (COLACE) 100 MG capsule Take 100 mg by mouth daily as needed.      . furosemide (LASIX) 20 MG tablet Take 1 tablet (20 mg total) by mouth daily.  30 tablet  11  . HYDROcodone-acetaminophen (NORCO/VICODIN) 5-325 MG per tablet Take 1 tablet by mouth 3 (three) times daily as needed.  90 tablet  4  . insulin glargine (LANTUS) 100 UNIT/ML injection Inject 27 Units into the skin at bedtime.       . insulin lispro (HUMALOG) 100 UNIT/ML injection Inject 5-17 Units into the skin 3 (three) times daily before  meals.      Marland Kitchen levothyroxine (SYNTHROID, LEVOTHROID) 75 MCG tablet Take 1 tablet (75 mcg total) by mouth daily.  30 tablet  11  . lisinopril (PRINIVIL,ZESTRIL) 20 MG tablet Take 1 tablet (20 mg total) by mouth 2 (two) times daily.  60 tablet  11  . loperamide (IMODIUM A-D) 2 MG tablet Take 2 mg by mouth 4 (four) times daily as needed. Non Formula Anti Diarrheal      . metoprolol (TOPROL-XL) 200 MG 24 hr tablet Take 1 tablet (200 mg total) by mouth daily.  30 tablet  11  . Multiple Vitamins-Minerals (CENTRUM SILVER PO) Take 1 tablet by mouth daily.        Marland Kitchen omeprazole (PRILOSEC) 20 MG capsule Take 1 capsule (20 mg total) by mouth daily.  30 capsule  11  . ondansetron (ZOFRAN) 4 MG tablet Take 1 tablet (4 mg total) by mouth every 8 (eight) hours as needed.  90 tablet  11  . Pancrelipase, Lip-Prot-Amyl, 5000 UNITS CPEP Take 3 capsules (15,000 Units total) by mouth 3 (three) times daily.  270 capsule  11  . pravastatin (PRAVACHOL) 20 MG tablet Take 1 tablet (20 mg total) by mouth daily.  30 tablet  11  . Syringe/Needle, Disp, (B-D ECLIPSE SYRINGE) 30G X 1/2" 1 ML MISC as  directed.  100 each  6  . triamcinolone cream (KENALOG) 0.1 % Apply topically 2 (two) times daily.  30 g  0  . fluticasone (FLONASE) 50 MCG/ACT nasal spray Place 2 sprays into the nose daily.  16 g  6  . Glucosamine-Chondroitin (OSTEO BI-FLEX REGULAR STRENGTH PO) Take 1 tablet by mouth daily.        . predniSONE (DELTASONE) 10 MG tablet Take 10 mg by mouth daily.        No facility-administered encounter medications on file as of 03/14/2013.   BP 140/80  Pulse 64  Temp(Src) 98.4 F (36.9 C) (Oral)  Wt 175 lb (79.379 kg)  BMI 37.19 kg/m2  SpO2 96%  Review of Systems  Constitutional: Negative for fever, chills, appetite change, fatigue and unexpected weight change.  HENT: Negative for ear pain, congestion, sore throat, trouble swallowing, neck pain, voice change and sinus pressure.   Eyes: Negative for visual disturbance.   Respiratory: Negative for cough, shortness of breath, wheezing and stridor.   Cardiovascular: Negative for chest pain, palpitations and leg swelling.  Gastrointestinal: Positive for abdominal pain. Negative for nausea, vomiting, diarrhea, constipation, blood in stool, abdominal distention and anal bleeding.  Genitourinary: Negative for dysuria and flank pain.  Musculoskeletal: Negative for myalgias, arthralgias and gait problem.  Skin: Negative for color change and rash.  Neurological: Negative for dizziness and headaches.  Hematological: Negative for adenopathy. Does not bruise/bleed easily.  Psychiatric/Behavioral: Negative for suicidal ideas, sleep disturbance and dysphoric mood. The patient is not nervous/anxious.        Objective:   Physical Exam  Constitutional: She is oriented to person, place, and time. She appears well-developed and well-nourished. No distress.  HENT:  Head: Normocephalic and atraumatic.  Right Ear: External ear normal.  Left Ear: External ear normal.  Nose: Nose normal.  Mouth/Throat: Oropharynx is clear and moist. No oropharyngeal exudate.  Eyes: Conjunctivae are normal. Pupils are equal, round, and reactive to light. Right eye exhibits no discharge. Left eye exhibits no discharge. No scleral icterus.  Neck: Normal range of motion. Neck supple. No tracheal deviation present. No thyromegaly present.  Cardiovascular: Normal rate, regular rhythm, normal heart sounds and intact distal pulses.  Exam reveals no gallop and no friction rub.   No murmur heard. Pulmonary/Chest: Effort normal and breath sounds normal. No accessory muscle usage. Not tachypneic. No respiratory distress. She has no decreased breath sounds. She has no wheezes. She has no rhonchi. She has no rales. She exhibits no tenderness.  Abdominal: Soft. Bowel sounds are normal. She exhibits no distension. There is no tenderness. There is no rebound and no guarding.  Musculoskeletal: Normal range of  motion. She exhibits no edema and no tenderness.  Lymphadenopathy:    She has no cervical adenopathy.  Neurological: She is alert and oriented to person, place, and time. No cranial nerve deficit. She exhibits normal muscle tone. Coordination normal.  Skin: Skin is warm and dry. No rash noted. She is not diaphoretic. No erythema. No pallor.  Psychiatric: She has a normal mood and affect. Her behavior is normal. Judgment and thought content normal.          Assessment & Plan:

## 2013-03-14 NOTE — Assessment & Plan Note (Addendum)
One day history of diffuse abdominal pain after eating grapes and apples. Suspect related to increased fiber and gas. Exam is normal. Will continue to monitor for now. She is tolerating both solids and liquids. She will call tomorrow if any persistence of abdominal pain, nausea, or if any change in symptoms. She'll call immediately if any fever or chills. Low threshold to repeat CT of the abdomen given her extensive history of abdominal surgery.

## 2013-03-14 NOTE — Assessment & Plan Note (Signed)
Will check LFTs with labs today. Continue pravastatin.

## 2013-03-14 NOTE — Assessment & Plan Note (Signed)
Recent A1c 6.8%. Reviewed blood sugars with patient today which appear to be well-controlled. Will repeat A1c with labs today and schedule 3 month followup. Will request records on recent foot exam by podiatry and eye exam by ophthalmology. Continue current medications.

## 2013-03-14 NOTE — Assessment & Plan Note (Signed)
BP Readings from Last 3 Encounters:  03/14/13 140/80  01/18/13 128/90  11/30/12 128/78   Blood pressure generally well controlled on current medications. Will continue. Will check renal function with labs today.

## 2013-03-15 LAB — CBC WITH DIFFERENTIAL/PLATELET
Basophils Relative: 0.4 % (ref 0.0–3.0)
HCT: 41.8 % (ref 36.0–46.0)
Hemoglobin: 13.8 g/dL (ref 12.0–15.0)
Lymphocytes Relative: 17.4 % (ref 12.0–46.0)
MCHC: 32.9 g/dL (ref 30.0–36.0)
Monocytes Relative: 8.4 % (ref 3.0–12.0)
Neutro Abs: 6.7 10*3/uL (ref 1.4–7.7)
RBC: 4.52 Mil/uL (ref 3.87–5.11)

## 2013-03-15 LAB — COMPREHENSIVE METABOLIC PANEL
AST: 19 U/L (ref 0–37)
BUN: 17 mg/dL (ref 6–23)
Calcium: 9 mg/dL (ref 8.4–10.5)
Chloride: 102 mEq/L (ref 96–112)
Creatinine, Ser: 1 mg/dL (ref 0.4–1.2)
GFR: 59.91 mL/min — ABNORMAL LOW (ref >60.00)
Total Bilirubin: 0.8 mg/dL (ref 0.3–1.2)

## 2013-03-22 ENCOUNTER — Encounter: Payer: Self-pay | Admitting: Cardiothoracic Surgery

## 2013-03-26 ENCOUNTER — Encounter: Payer: Self-pay | Admitting: Internal Medicine

## 2013-04-11 ENCOUNTER — Other Ambulatory Visit: Payer: Self-pay | Admitting: *Deleted

## 2013-04-11 MED ORDER — BUSPIRONE HCL 10 MG PO TABS: 10.0000 mg | ORAL_TABLET | Freq: Two times a day (BID) | ORAL | Status: DC

## 2013-04-11 MED ORDER — CARBIDOPA-LEVODOPA ER 25-100 MG PO TBCR: 1.0000 | EXTENDED_RELEASE_TABLET | Freq: Three times a day (TID) | ORAL | Status: DC

## 2013-04-11 NOTE — Telephone Encounter (Signed)
Refill Request  Carbidopa / Levodopa 25-100 mg TER   #270  Take one tablet 3 times each day

## 2013-04-12 ENCOUNTER — Telehealth: Payer: Self-pay | Admitting: *Deleted

## 2013-04-12 MED ORDER — AMLODIPINE BESYLATE 2.5 MG PO TABS: 2.5000 mg | ORAL_TABLET | Freq: Every day | ORAL | Status: DC

## 2013-04-12 MED ORDER — METOPROLOL SUCCINATE ER 200 MG PO TB24: 200.0000 mg | ORAL_TABLET | Freq: Every day | ORAL | Status: DC

## 2013-04-12 MED ORDER — PRAVASTATIN SODIUM 20 MG PO TABS: 20.0000 mg | ORAL_TABLET | Freq: Every day | ORAL | Status: DC

## 2013-04-12 MED ORDER — BUSPIRONE HCL 10 MG PO TABS: 10.0000 mg | ORAL_TABLET | Freq: Two times a day (BID) | ORAL | Status: DC

## 2013-04-12 MED ORDER — OMEPRAZOLE 20 MG PO CPDR: 20.0000 mg | DELAYED_RELEASE_CAPSULE | Freq: Every day | ORAL | Status: DC

## 2013-04-12 MED ORDER — CARBIDOPA-LEVODOPA ER 25-100 MG PO TBCR: 1.0000 | EXTENDED_RELEASE_TABLET | Freq: Three times a day (TID) | ORAL | Status: DC

## 2013-04-12 MED ORDER — LISINOPRIL 20 MG PO TABS: 20.0000 mg | ORAL_TABLET | Freq: Two times a day (BID) | ORAL | Status: DC

## 2013-04-12 NOTE — Telephone Encounter (Signed)
Pharmacy Note:  Lisinopril  Amlodipine   Pravastatin  Metoprolol   Pt would like 90 dispensed on each

## 2013-04-12 NOTE — Telephone Encounter (Signed)
Pharmacy Note:  Omeprazole   She would like 90

## 2013-04-12 NOTE — Telephone Encounter (Signed)
Prescriptions sent to pharmacy

## 2013-04-26 ENCOUNTER — Inpatient Hospital Stay: Payer: Self-pay | Admitting: Surgery

## 2013-04-26 LAB — COMPREHENSIVE METABOLIC PANEL
Albumin: 3.5 g/dL (ref 3.4–5.0)
Alkaline Phosphatase: 121 U/L (ref 50–136)
Anion Gap: 2 — ABNORMAL LOW (ref 7–16)
BUN: 18 mg/dL (ref 7–18)
Bilirubin,Total: 0.5 mg/dL (ref 0.2–1.0)
Chloride: 105 mmol/L (ref 98–107)
Co2: 34 mmol/L — ABNORMAL HIGH (ref 21–32)
EGFR (African American): 58 — ABNORMAL LOW
EGFR (Non-African Amer.): 50 — ABNORMAL LOW
Glucose: 116 mg/dL — ABNORMAL HIGH (ref 65–99)
Potassium: 3.8 mmol/L (ref 3.5–5.1)
SGOT(AST): 70 U/L — ABNORMAL HIGH (ref 15–37)
Sodium: 141 mmol/L (ref 136–145)
Total Protein: 6.9 g/dL (ref 6.4–8.2)

## 2013-04-26 LAB — CBC
HGB: 14.7 g/dL (ref 12.0–16.0)
MCH: 30.5 pg (ref 26.0–34.0)
MCV: 89 fL (ref 80–100)
Platelet: 95 10*3/uL — ABNORMAL LOW (ref 150–440)
RBC: 4.8 10*6/uL (ref 3.80–5.20)
WBC: 9 10*3/uL (ref 3.6–11.0)

## 2013-04-26 LAB — URINALYSIS, COMPLETE
Bilirubin,UR: NEGATIVE
Blood: NEGATIVE
Ketone: NEGATIVE
Nitrite: NEGATIVE
Protein: NEGATIVE
RBC,UR: 3 /HPF (ref 0–5)
Specific Gravity: 1.01 (ref 1.003–1.030)
WBC UR: 10 /HPF (ref 0–5)

## 2013-04-26 LAB — LIPASE, BLOOD: Lipase: 38 U/L — ABNORMAL LOW (ref 73–393)

## 2013-04-27 LAB — CBC WITH DIFFERENTIAL/PLATELET
Basophil %: 0.2 %
Eosinophil #: 0.1 10*3/uL (ref 0.0–0.7)
HCT: 36.5 % (ref 35.0–47.0)
HGB: 12.3 g/dL (ref 12.0–16.0)
Lymphocyte #: 1.8 10*3/uL (ref 1.0–3.6)
Lymphocyte %: 26.8 %
MCH: 30.6 pg (ref 26.0–34.0)
MCHC: 33.7 g/dL (ref 32.0–36.0)
Monocyte #: 0.7 x10 3/mm (ref 0.2–0.9)
Monocyte %: 9.8 %
Neutrophil %: 61.3 %

## 2013-04-27 LAB — COMPREHENSIVE METABOLIC PANEL
Albumin: 2.7 g/dL — ABNORMAL LOW (ref 3.4–5.0)
Alkaline Phosphatase: 92 U/L (ref 50–136)
BUN: 12 mg/dL (ref 7–18)
Bilirubin,Total: 0.7 mg/dL (ref 0.2–1.0)
Calcium, Total: 8.1 mg/dL — ABNORMAL LOW (ref 8.5–10.1)
Chloride: 107 mmol/L (ref 98–107)
Co2: 29 mmol/L (ref 21–32)
EGFR (Non-African Amer.): 55 — ABNORMAL LOW
Potassium: 4.4 mmol/L (ref 3.5–5.1)
SGOT(AST): 27 U/L (ref 15–37)

## 2013-04-28 LAB — BASIC METABOLIC PANEL
Chloride: 104 mmol/L (ref 98–107)
Creatinine: 0.92 mg/dL (ref 0.60–1.30)
Glucose: 195 mg/dL — ABNORMAL HIGH (ref 65–99)
Osmolality: 280 (ref 275–301)
Potassium: 4.4 mmol/L (ref 3.5–5.1)
Sodium: 138 mmol/L (ref 136–145)

## 2013-04-30 ENCOUNTER — Encounter: Payer: Self-pay | Admitting: Cardiothoracic Surgery

## 2013-05-13 ENCOUNTER — Other Ambulatory Visit: Payer: Self-pay | Admitting: *Deleted

## 2013-05-13 MED ORDER — FUROSEMIDE 20 MG PO TABS: 20.0000 mg | ORAL_TABLET | Freq: Every day | ORAL | Status: DC

## 2013-05-13 MED ORDER — CARBIDOPA-LEVODOPA ER 25-100 MG PO TBCR: 1.0000 | EXTENDED_RELEASE_TABLET | Freq: Three times a day (TID) | ORAL | Status: DC

## 2013-05-13 MED ORDER — BUSPIRONE HCL 10 MG PO TABS: 10.0000 mg | ORAL_TABLET | Freq: Two times a day (BID) | ORAL | Status: DC

## 2013-05-13 NOTE — Telephone Encounter (Signed)
Pharmacy Note:  Patient requesting 90 day supply

## 2013-05-22 ENCOUNTER — Encounter: Payer: Self-pay | Admitting: Cardiothoracic Surgery

## 2013-05-29 ENCOUNTER — Telehealth: Payer: Self-pay | Admitting: Internal Medicine

## 2013-05-29 NOTE — Telephone Encounter (Signed)
Left vm.  States fax sent regarding back brace on 9/23 and 10/3.  Have we received?  Please complete and return form to them asap.  Contact with questions [phone] ref# [id number]

## 2013-05-30 NOTE — Telephone Encounter (Signed)
I spoke with someone from this company the other day, Tuesday 10/7 in reference to this and explained to them that the patient informed us this is something she is not interested in.

## 2013-06-11 ENCOUNTER — Ambulatory Visit (INDEPENDENT_AMBULATORY_CARE_PROVIDER_SITE_OTHER): Payer: Medicare Other | Admitting: Adult Health

## 2013-06-11 ENCOUNTER — Encounter: Payer: Self-pay | Admitting: Adult Health

## 2013-06-11 VITALS — BP 156/90 | HR 64 | Temp 98.2°F | Wt 180.8 lb

## 2013-06-11 DIAGNOSIS — J329 Chronic sinusitis, unspecified: Secondary | ICD-10-CM

## 2013-06-11 MED ORDER — AMOXICILLIN-POT CLAVULANATE 875-125 MG PO TABS: 1.0000 | ORAL_TABLET | Freq: Two times a day (BID) | ORAL | Status: DC

## 2013-06-11 NOTE — Progress Notes (Signed)
Subjective:    Patient ID: [name], female    DOB: 1929/05/25, 79 y.o.   MRN: [id number]  HPI  Patient presents with sinus symptoms that have been ongoing x 2 weeks. She feels achy, head stuffed, yellowish drainage. She has not taken anything over the counter. She has been using her flonase.   Current Outpatient Prescriptions on File Prior to Visit  Medication Sig Dispense Refill  . ALPRAZolam (XANAX) 1 MG tablet Take 1/2 tab in am, 1/2 tab at lunch and 1 tab at bedtime as needed  60 tablet  4  . amLODipine (NORVASC) 2.5 MG tablet Take 1 tablet (2.5 mg total) by mouth daily.  90 tablet  0  . aspirin 81 MG tablet Take 81 mg by mouth daily.        . busPIRone (BUSPAR) 10 MG tablet Take 1 tablet (10 mg total) by mouth 2 (two) times daily.  180 tablet  1  . carbidopa-levodopa (SINEMET CR) 25-100 MG per tablet Take 1 tablet by mouth 3 (three) times daily.  90 tablet  5  . Carbidopa-Levodopa ER (SINEMET CR) 25-100 MG tablet controlled release Take 1 tablet by mouth 3 (three) times daily.  270 tablet  0  . fluticasone (FLONASE) 50 MCG/ACT nasal spray Place 2 sprays into the nose daily.  16 g  6  . furosemide (LASIX) 20 MG tablet Take 1 tablet (20 mg total) by mouth daily.  90 tablet  1  . HYDROcodone-acetaminophen (NORCO/VICODIN) 5-325 MG per tablet Take 1 tablet by mouth 3 (three) times daily as needed.  90 tablet  4  . insulin glargine (LANTUS) 100 UNIT/ML injection Inject 27 Units into the skin at bedtime.       . insulin lispro (HUMALOG) 100 UNIT/ML injection Inject 5-17 Units into the skin 3 (three) times daily before meals.      Marland Kitchen levothyroxine (SYNTHROID, LEVOTHROID) 75 MCG tablet Take 1 tablet (75 mcg total) by mouth daily.  30 tablet  11  . lisinopril (PRINIVIL,ZESTRIL) 20 MG tablet Take 1 tablet (20 mg total) by mouth 2 (two) times daily.  180 tablet  0  . loperamide (IMODIUM A-D) 2 MG tablet Take 2 mg by mouth 4 (four) times daily as needed. Non Formula Anti Diarrheal      .  metoprolol (TOPROL-XL) 200 MG 24 hr tablet Take 1 tablet (200 mg total) by mouth daily.  90 tablet  0  . Multiple Vitamins-Minerals (CENTRUM SILVER PO) Take 1 tablet by mouth daily.        . ondansetron (ZOFRAN) 4 MG tablet Take 1 tablet (4 mg total) by mouth every 8 (eight) hours as needed.  90 tablet  11  . Pancrelipase, Lip-Prot-Amyl, 5000 UNITS CPEP Take 3 capsules (15,000 Units total) by mouth 3 (three) times daily.  270 capsule  11  . pravastatin (PRAVACHOL) 20 MG tablet Take 1 tablet (20 mg total) by mouth daily.  90 tablet  0  . predniSONE (DELTASONE) 10 MG tablet Take 10 mg by mouth daily.       . Syringe/Needle, Disp, (B-D ECLIPSE SYRINGE) 30G X 1/2" 1 ML MISC as directed.  100 each  6  . triamcinolone cream (KENALOG) 0.1 % Apply topically 2 (two) times daily.  30 g  0  . docusate sodium (COLACE) 100 MG capsule Take 100 mg by mouth daily as needed.      . Glucosamine-Chondroitin (OSTEO BI-FLEX REGULAR STRENGTH PO) Take 1 tablet by mouth daily.        Marland Kitchen  omeprazole (PRILOSEC) 20 MG capsule Take 1 capsule (20 mg total) by mouth daily.  90 capsule  0   No current facility-administered medications on file prior to visit.       Review of Systems  Constitutional: Positive for fatigue. Negative for fever and chills.  HENT: Positive for congestion, postnasal drip, rhinorrhea and sinus pressure. Negative for sore throat.   Respiratory: Negative for cough and wheezing.        Objective:   Physical Exam  Constitutional: She is oriented to person, place, and time.  Pleasant 79 y/o female in NAD.  HENT:  Head: Normocephalic and atraumatic.  Right Ear: External ear normal.  Left Ear: External ear normal.  Pharyngeal erythema without exudate. Drainage posterior pharynx.  Eyes: Conjunctivae and EOM are normal. Pupils are equal, round, and reactive to light.  Cardiovascular: Normal rate, regular rhythm and normal heart sounds.   Pulmonary/Chest: Effort normal and breath sounds normal. No  respiratory distress. She has no wheezes. She has no rales.  Lymphadenopathy:    She has no cervical adenopathy.  Neurological: She is alert and oriented to person, place, and time.  Psychiatric: She has a normal mood and affect. Her behavior is normal.    BP 156/90  Pulse 64  Temp(Src) 98.2 F (36.8 C) (Oral)  Wt 180 lb 12.8 oz (82.01 kg)  BMI 38.42 kg/m2  SpO2 97%       Assessment & Plan:

## 2013-06-11 NOTE — Patient Instructions (Signed)
Start Augmentin 1 tablet twice a day for 10 days.   Sinusitis Sinusitis is redness, soreness, and swelling (inflammation) of the paranasal sinuses. Paranasal sinuses are air pockets within the bones of your face (beneath the eyes, the middle of the forehead, or above the eyes). In healthy paranasal sinuses, mucus is able to drain out, and air is able to circulate through them by way of your nose. However, when your paranasal sinuses are inflamed, mucus and air can become trapped. This can allow bacteria and other germs to grow and cause infection. Sinusitis can develop quickly and last only a short time (acute) or continue over a long period (chronic). Sinusitis that lasts for more than 12 weeks is considered chronic.  CAUSES  Causes of sinusitis include:  Allergies.  Structural abnormalities, such as displacement of the cartilage that separates your nostrils (deviated septum), which can decrease the air flow through your nose and sinuses and affect sinus drainage.  Functional abnormalities, such as when the small hairs (cilia) that line your sinuses and help remove mucus do not work properly or are not present. SYMPTOMS  Symptoms of acute and chronic sinusitis are the same. The primary symptoms are pain and pressure around the affected sinuses. Other symptoms include:  Upper toothache.  Earache.  Headache.  Bad breath.  Decreased sense of smell and taste.  A cough, which worsens when you are lying flat.  Fatigue.  Fever.  Thick drainage from your nose, which often is green and may contain pus (purulent).  Swelling and warmth over the affected sinuses. DIAGNOSIS  Your caregiver will perform a physical exam. During the exam, your caregiver may:  Look in your nose for signs of abnormal growths in your nostrils (nasal polyps).  Tap over the affected sinus to check for signs of infection.  View the inside of your sinuses (endoscopy) with a special imaging device with a light  attached (endoscope), which is inserted into your sinuses. If your caregiver suspects that you have chronic sinusitis, one or more of the following tests may be recommended:  Allergy tests.  Nasal culture A sample of mucus is taken from your nose and sent to a lab and screened for bacteria.  Nasal cytology A sample of mucus is taken from your nose and examined by your caregiver to determine if your sinusitis is related to an allergy. TREATMENT  Most cases of acute sinusitis are related to a viral infection and will resolve on their own within 10 days. Sometimes medicines are prescribed to help relieve symptoms (pain medicine, decongestants, nasal steroid sprays, or saline sprays).  However, for sinusitis related to a bacterial infection, your caregiver will prescribe antibiotic medicines. These are medicines that will help kill the bacteria causing the infection.  Rarely, sinusitis is caused by a fungal infection. In theses cases, your caregiver will prescribe antifungal medicine. For some cases of chronic sinusitis, surgery is needed. Generally, these are cases in which sinusitis recurs more than 3 times per year, despite other treatments. HOME CARE INSTRUCTIONS   Drink plenty of water. Water helps thin the mucus so your sinuses can drain more easily.  Use a humidifier.  Inhale steam 3 to 4 times a day (for example, sit in the bathroom with the shower running).  Apply a warm, moist washcloth to your face 3 to 4 times a day, or as directed by your caregiver.  Use saline nasal sprays to help moisten and clean your sinuses.  Take over-the-counter or prescription medicines for pain,  discomfort, or fever only as directed by your caregiver. SEEK IMMEDIATE MEDICAL CARE IF:  You have increasing pain or severe headaches.  You have nausea, vomiting, or drowsiness.  You have swelling around your face.  You have vision problems.  You have a stiff neck.  You have difficulty breathing. MAKE  SURE YOU:   Understand these instructions.  Will watch your condition.  Will get help right away if you are not doing well or get worse. Document Released: 08/08/2005 Document Revised: 10/31/2011 Document Reviewed: 08/23/2011 [employer] Patient Information 2014 [place], [place].

## 2013-06-11 NOTE — Assessment & Plan Note (Signed)
Symptoms ongoing > 10 days. Start Augmentin bid x 10 days. RTC if no improvement in 3-4 days.

## 2013-06-13 ENCOUNTER — Ambulatory Visit: Payer: Medicare Other | Admitting: Internal Medicine

## 2013-06-18 ENCOUNTER — Ambulatory Visit: Payer: Medicare Other | Admitting: Internal Medicine

## 2013-06-20 ENCOUNTER — Encounter: Payer: Self-pay | Admitting: Internal Medicine

## 2013-06-20 ENCOUNTER — Ambulatory Visit (INDEPENDENT_AMBULATORY_CARE_PROVIDER_SITE_OTHER)
Admission: RE | Admit: 2013-06-20 | Discharge: 2013-06-20 | Disposition: A | Discharge status: Home / Self Care | Payer: Medicare Other | Source: Ambulatory Visit | Attending: Internal Medicine | Admitting: Internal Medicine

## 2013-06-20 ENCOUNTER — Ambulatory Visit (INDEPENDENT_AMBULATORY_CARE_PROVIDER_SITE_OTHER): Payer: Medicare Other | Admitting: Internal Medicine

## 2013-06-20 VITALS — BP 156/80 | HR 58 | Temp 98.1°F | Wt 179.0 lb

## 2013-06-20 DIAGNOSIS — R0600 Dyspnea, unspecified: Secondary | ICD-10-CM

## 2013-06-20 DIAGNOSIS — R0609 Other forms of dyspnea: Secondary | ICD-10-CM

## 2013-06-20 DIAGNOSIS — J329 Chronic sinusitis, unspecified: Secondary | ICD-10-CM

## 2013-06-20 MED ORDER — LEVOFLOXACIN 500 MG PO TABS: 500.0000 mg | ORAL_TABLET | Freq: Every day | ORAL | Status: DC

## 2013-06-20 NOTE — Assessment & Plan Note (Signed)
No improvement in sinusitis with treatment with Augmentin. Will broaden coverage and change to Levaquin. If no improvement over next few days, we discussed referral to ENT for direct visualization of the sinuses. Follow up 4 days and prn.

## 2013-06-20 NOTE — Assessment & Plan Note (Signed)
Postnasal drip likely contributing to symptoms. Normal respiratory effort on exam today and oxygen saturation on room air 97%. Exam is markable for scattered diffuse rhonchi. Will get chest x-ray for further evaluation.

## 2013-06-20 NOTE — Progress Notes (Signed)
Subjective:    Patient ID: [name], female    DOB: [birth year], 79 y.o.   MRN: [id number]  HPI 79 year old female with history of diabetes, hypertension, chronic abdominal wound, pancreatic insufficiency presents for followup visit. She was seen in clinic last week complaining of sinus congestion and general malaise. She was diagnosed with sinusitis and started on Augmentin. She has now completed course of Augmentin but reports persistent symptoms of nasal congestion, sinus pressure, facial and headache pain, and postnasal drip. She denies any fever or chills. She has occasional nonproductive cough. She notes slight worsening of her chronic shortness of breath. She is not currently taking any medications for symptoms.  Outpatient Encounter Prescriptions as of 06/20/2013  Medication Sig Dispense Refill  . ALPRAZolam (XANAX) 1 MG tablet Take 1/2 tab in am, 1/2 tab at lunch and 1 tab at bedtime as needed  60 tablet  4  . amLODipine (NORVASC) 2.5 MG tablet Take 1 tablet (2.5 mg total) by mouth daily.  90 tablet  0  . amoxicillin-clavulanate (AUGMENTIN) 875-125 MG per tablet Take 1 tablet by mouth 2 (two) times daily.  20 tablet  0  . aspirin 81 MG tablet Take 81 mg by mouth daily.        . busPIRone (BUSPAR) 10 MG tablet Take 1 tablet (10 mg total) by mouth 2 (two) times daily.  180 tablet  1  . Carbidopa-Levodopa ER (SINEMET CR) 25-100 MG tablet controlled release Take 1 tablet by mouth 3 (three) times daily.  270 tablet  0  . fluticasone (FLONASE) 50 MCG/ACT nasal spray Place 2 sprays into the nose daily.  16 g  6  . furosemide (LASIX) 20 MG tablet Take 1 tablet (20 mg total) by mouth daily.  90 tablet  1  . HYDROcodone-acetaminophen (NORCO/VICODIN) 5-325 MG per tablet Take 1 tablet by mouth 3 (three) times daily as needed.  90 tablet  4  . insulin glargine (LANTUS) 100 UNIT/ML injection Inject 27 Units into the skin at bedtime.       . insulin lispro (HUMALOG) 100 UNIT/ML injection Inject 5-17  Units into the skin 3 (three) times daily before meals.      Marland Kitchen levothyroxine (SYNTHROID, LEVOTHROID) 75 MCG tablet Take 1 tablet (75 mcg total) by mouth daily.  30 tablet  11  . lisinopril (PRINIVIL,ZESTRIL) 20 MG tablet Take 1 tablet (20 mg total) by mouth 2 (two) times daily.  180 tablet  0  . metoprolol (TOPROL-XL) 200 MG 24 hr tablet Take 1 tablet (200 mg total) by mouth daily.  90 tablet  0  . Multiple Vitamins-Minerals (CENTRUM SILVER PO) Take 1 tablet by mouth daily.        Marland Kitchen omeprazole (PRILOSEC) 20 MG capsule Take 1 capsule (20 mg total) by mouth daily.  90 capsule  0  . Pancrelipase, Lip-Prot-Amyl, 5000 UNITS CPEP Take 3 capsules (15,000 Units total) by mouth 3 (three) times daily.  270 capsule  11  . pravastatin (PRAVACHOL) 20 MG tablet Take 1 tablet (20 mg total) by mouth daily.  90 tablet  0  . predniSONE (DELTASONE) 10 MG tablet Take 10 mg by mouth daily.       . Syringe/Needle, Disp, (B-D ECLIPSE SYRINGE) 30G X 1/2" 1 ML MISC as directed.  100 each  6  . carbidopa-levodopa (SINEMET CR) 25-100 MG per tablet Take 1 tablet by mouth 3 (three) times daily.  90 tablet  5  . docusate sodium (COLACE) 100 MG capsule Take  100 mg by mouth daily as needed.      . Glucosamine-Chondroitin (OSTEO BI-FLEX REGULAR STRENGTH PO) Take 1 tablet by mouth daily.        Marland Kitchen loperamide (IMODIUM A-D) 2 MG tablet Take 2 mg by mouth 4 (four) times daily as needed. Non Formula Anti Diarrheal      . ondansetron (ZOFRAN) 4 MG tablet Take 1 tablet (4 mg total) by mouth every 8 (eight) hours as needed.  90 tablet  11  . rOPINIRole (REQUIP) 0.25 MG tablet Take 0.25 mg by mouth at bedtime as needed.      . triamcinolone cream (KENALOG) 0.1 % Apply topically 2 (two) times daily.  30 g  0   No facility-administered encounter medications on file as of 06/20/2013.   BP 156/80  Pulse 58  Temp(Src) 98.1 F (36.7 C) (Oral)  Wt 179 lb (81.194 kg)  BMI 38.04 kg/m2  SpO2 97%  Review of Systems  Constitutional: Positive  for fatigue. Negative for fever, chills, appetite change and unexpected weight change.  HENT: Positive for congestion, postnasal drip, rhinorrhea and sinus pressure. Negative for ear pain, sore throat, trouble swallowing and voice change.   Eyes: Negative for visual disturbance.  Respiratory: Positive for cough (occasional non-productive). Negative for shortness of breath, wheezing and stridor.   Cardiovascular: Negative for chest pain, palpitations and leg swelling.  Gastrointestinal: Negative for nausea, vomiting, abdominal pain, diarrhea, constipation, blood in stool, abdominal distention and anal bleeding.  Genitourinary: Negative for dysuria and flank pain.  Musculoskeletal: Negative for arthralgias, gait problem, myalgias and neck pain.  Skin: Negative for color change and rash.  Neurological: Negative for dizziness and headaches.  Hematological: Negative for adenopathy. Does not bruise/bleed easily.  Psychiatric/Behavioral: Negative for suicidal ideas, sleep disturbance and dysphoric mood. The patient is not nervous/anxious.        Objective:   Physical Exam  Constitutional: She is oriented to person, place, and time. She appears well-developed and well-nourished. No distress.  HENT:  Head: Normocephalic and atraumatic.  Right Ear: External ear normal.  Left Ear: External ear normal.  Nose: Nose normal.  Mouth/Throat: Oropharynx is clear and moist. No oropharyngeal exudate.  Eyes: Conjunctivae are normal. Pupils are equal, round, and reactive to light. Right eye exhibits no discharge. Left eye exhibits no discharge. No scleral icterus.  Neck: Normal range of motion. Neck supple. No tracheal deviation present. No thyromegaly present.  Cardiovascular: Normal rate, regular rhythm, normal heart sounds and intact distal pulses.  Exam reveals no gallop and no friction rub.   No murmur heard. Pulmonary/Chest: Effort normal. No accessory muscle usage. Not tachypneic. No respiratory  distress. She has no decreased breath sounds. She has no wheezes. She has rhonchi (scattered). She has no rales. She exhibits no tenderness.  Musculoskeletal: Normal range of motion. She exhibits no edema and no tenderness.  Lymphadenopathy:    She has no cervical adenopathy.  Neurological: She is alert and oriented to person, place, and time. No cranial nerve deficit. She exhibits normal muscle tone. Coordination normal.  Skin: Skin is warm and dry. No rash noted. She is not diaphoretic. No erythema. No pallor.  Psychiatric: She has a normal mood and affect. Her behavior is normal. Judgment and thought content normal.          Assessment & Plan:

## 2013-06-22 ENCOUNTER — Encounter: Payer: Self-pay | Admitting: Cardiothoracic Surgery

## 2013-06-24 ENCOUNTER — Telehealth: Payer: Self-pay | Admitting: *Deleted

## 2013-06-24 ENCOUNTER — Other Ambulatory Visit: Payer: Self-pay | Admitting: *Deleted

## 2013-06-24 ENCOUNTER — Ambulatory Visit (INDEPENDENT_AMBULATORY_CARE_PROVIDER_SITE_OTHER): Payer: Medicare Other | Admitting: Internal Medicine

## 2013-06-24 ENCOUNTER — Encounter: Payer: Self-pay | Admitting: Internal Medicine

## 2013-06-24 VITALS — BP 144/84 | HR 65 | Temp 98.1°F | Wt 180.0 lb

## 2013-06-24 DIAGNOSIS — R0609 Other forms of dyspnea: Secondary | ICD-10-CM

## 2013-06-24 DIAGNOSIS — Z23 Encounter for immunization: Secondary | ICD-10-CM

## 2013-06-24 DIAGNOSIS — J329 Chronic sinusitis, unspecified: Secondary | ICD-10-CM

## 2013-06-24 DIAGNOSIS — R0600 Dyspnea, unspecified: Secondary | ICD-10-CM

## 2013-06-24 DIAGNOSIS — IMO0001 Reserved for inherently not codable concepts without codable children: Secondary | ICD-10-CM

## 2013-06-24 DIAGNOSIS — M1710 Unilateral primary osteoarthritis, unspecified knee: Secondary | ICD-10-CM

## 2013-06-24 MED ORDER — ALPRAZOLAM 1 MG PO TABS: ORAL_TABLET | ORAL | Status: DC

## 2013-06-24 MED ORDER — LEVOTHYROXINE SODIUM 75 MCG PO TABS: 75.0000 ug | ORAL_TABLET | Freq: Every day | ORAL | Status: DC

## 2013-06-24 MED ORDER — LEVOFLOXACIN 500 MG PO TABS: 500.0000 mg | ORAL_TABLET | Freq: Every day | ORAL | Status: DC

## 2013-06-24 MED ORDER — CARBIDOPA-LEVODOPA ER 25-100 MG PO TBCR: 1.0000 | EXTENDED_RELEASE_TABLET | Freq: Three times a day (TID) | ORAL | Status: DC

## 2013-06-24 MED ORDER — HYDROCODONE-ACETAMINOPHEN 5-325 MG PO TABS: 1.0000 | ORAL_TABLET | Freq: Three times a day (TID) | ORAL | Status: DC | PRN

## 2013-06-24 NOTE — Assessment & Plan Note (Signed)
Symptoms of sinus infection and cough have improved with Levaquin. Will continue for 7 days.Discussed potential referral to ENT if symptoms do not continue to resolve. Follow up 1 week and prn.

## 2013-06-24 NOTE — Telephone Encounter (Signed)
Fine to refill. We should recheck electrolytes at follow up, unless this has been done by another physician.

## 2013-06-24 NOTE — Telephone Encounter (Signed)
Refill Request  Potassium Chloride 10 MEQ TER   #90  Take one tablet each day

## 2013-06-24 NOTE — Telephone Encounter (Signed)
Eprescribed.

## 2013-06-24 NOTE — Telephone Encounter (Signed)
Refill Request  Zenpep [id number] CER   #810  Take 3 capsules by mouth 3 times a day as directed

## 2013-06-24 NOTE — Telephone Encounter (Signed)
While giving patient her flu shot, she requested a prescription for shingles vaccine sent to [facility].

## 2013-06-24 NOTE — Assessment & Plan Note (Signed)
Persistent pain in her left knee secondary to osteoarthritis. No improvement with steroid and Synvisc injections. Patient is on chronic prednisone. Will continue hydrocodone as needed for severe pain. Pt is not a good candidate for knee replacement given multiple ongoing medical issues. Will continue to monitor. Consider referral back to ortho if symptoms not well controlled.

## 2013-06-24 NOTE — Assessment & Plan Note (Signed)
Secondary to URI, postnasal drip and cough. Exam much improved today with treatment of URI with Levaquin. Will continue Levaquin and monitor. Follow up in 1 week for recheck.

## 2013-06-24 NOTE — Progress Notes (Signed)
Subjective:    Patient ID: [name], female    DOB: Apr 20, 1929, 79 y.o.   MRN: [id number]  HPI 79 year old female with history of diabetes, hypertension, pancreatic insufficiency, chronic open abdominal wound, and recent episode of sinusitis presents for followup. She reports continued nasal congestion however she reports that nasal congestion has cleared over the weekend. Her chest feels less tight. She is coughing clear sputum. She denies any chest pain. Shortness of breath is improving. She denies any fever or chills. No side effects noted from the medication.  She is concerned today about persistent left knee pain. She reports in the past that she had both steroid and Synvisc injections with an orthopedic surgeon. She had no improvement with this. She is currently taking hydrocodone as needed up to 3 times daily for severe pain. Pain is described as aching in her left knee which is made worse with movement and weightbearing. It does not radiate. She has not had any recent falls.  Outpatient Encounter Prescriptions as of 06/24/2013  Medication Sig  . ALPRAZolam (XANAX) 1 MG tablet Take 1/2 tab in am, 1/2 tab at lunch and 1 tab at bedtime as needed  . amLODipine (NORVASC) 2.5 MG tablet Take 1 tablet (2.5 mg total) by mouth daily.  Marland Kitchen aspirin 81 MG tablet Take 81 mg by mouth daily.    . busPIRone (BUSPAR) 10 MG tablet Take 1 tablet (10 mg total) by mouth 2 (two) times daily.  . carbidopa-levodopa (SINEMET CR) 25-100 MG per tablet Take 1 tablet by mouth 3 (three) times daily.  . Carbidopa-Levodopa ER (SINEMET CR) 25-100 MG tablet controlled release Take 1 tablet by mouth 3 (three) times daily.  Marland Kitchen docusate sodium (COLACE) 100 MG capsule Take 100 mg by mouth daily as needed.  . fluticasone (FLONASE) 50 MCG/ACT nasal spray Place 2 sprays into the nose daily.  . furosemide (LASIX) 20 MG tablet Take 1 tablet (20 mg total) by mouth daily.  . Glucosamine-Chondroitin (OSTEO BI-FLEX REGULAR STRENGTH  PO) Take 1 tablet by mouth daily.    Marland Kitchen HYDROcodone-acetaminophen (NORCO/VICODIN) 5-325 MG per tablet Take 1 tablet by mouth 3 (three) times daily as needed.  . insulin glargine (LANTUS) 100 UNIT/ML injection Inject 27 Units into the skin at bedtime.   . insulin lispro (HUMALOG) 100 UNIT/ML injection Inject 5-17 Units into the skin 3 (three) times daily before meals.  Marland Kitchen levofloxacin (LEVAQUIN) 500 MG tablet Take 1 tablet (500 mg total) by mouth daily.  Marland Kitchen levothyroxine (SYNTHROID, LEVOTHROID) 75 MCG tablet Take 1 tablet (75 mcg total) by mouth daily.  Marland Kitchen lisinopril (PRINIVIL,ZESTRIL) 20 MG tablet Take 1 tablet (20 mg total) by mouth 2 (two) times daily.  Marland Kitchen loperamide (IMODIUM A-D) 2 MG tablet Take 2 mg by mouth 4 (four) times daily as needed. Non Formula Anti Diarrheal  . metoprolol (TOPROL-XL) 200 MG 24 hr tablet Take 1 tablet (200 mg total) by mouth daily.  . Multiple Vitamins-Minerals (CENTRUM SILVER PO) Take 1 tablet by mouth daily.    Marland Kitchen omeprazole (PRILOSEC) 20 MG capsule Take 1 capsule (20 mg total) by mouth daily.  . ondansetron (ZOFRAN) 4 MG tablet Take 1 tablet (4 mg total) by mouth every 8 (eight) hours as needed.  . Pancrelipase, Lip-Prot-Amyl, 5000 UNITS CPEP Take 3 capsules (15,000 Units total) by mouth 3 (three) times daily.  . pravastatin (PRAVACHOL) 20 MG tablet Take 1 tablet (20 mg total) by mouth daily.  . predniSONE (DELTASONE) 10 MG tablet Take 10 mg by  mouth daily.   Marland Kitchen rOPINIRole (REQUIP) 0.25 MG tablet Take 0.25 mg by mouth at bedtime as needed.  . Syringe/Needle, Disp, (B-D ECLIPSE SYRINGE) 30G X 1/2" 1 ML MISC as directed.  . triamcinolone cream (KENALOG) 0.1 % Apply topically 2 (two) times daily.   BP 144/84  Pulse 65  Temp(Src) 98.1 F (36.7 C) (Oral)  Wt 180 lb (81.647 kg)  SpO2 95%  Review of Systems  Constitutional: Negative for fever, chills, appetite change, fatigue and unexpected weight change.  HENT: Positive for congestion, postnasal drip, rhinorrhea and  sinus pressure. Negative for ear pain, sore throat, trouble swallowing and voice change.   Eyes: Negative for visual disturbance.  Respiratory: Positive for cough and shortness of breath. Negative for wheezing and stridor.   Cardiovascular: Negative for chest pain, palpitations and leg swelling.  Gastrointestinal: Negative for nausea, vomiting, abdominal pain, diarrhea, constipation, blood in stool, abdominal distention and anal bleeding.  Genitourinary: Negative for dysuria and flank pain.  Musculoskeletal: Positive for arthralgias. Negative for gait problem, myalgias and neck pain.  Skin: Negative for color change and rash.  Neurological: Negative for dizziness and headaches.  Hematological: Negative for adenopathy. Does not bruise/bleed easily.  Psychiatric/Behavioral: Negative for suicidal ideas, sleep disturbance and dysphoric mood. The patient is not nervous/anxious.        Objective:   Physical Exam  Constitutional: She is oriented to person, place, and time. She appears well-developed and well-nourished. No distress.  HENT:  Head: Normocephalic and atraumatic.  Right Ear: External ear normal.  Left Ear: External ear normal.  Nose: Nose normal.  Mouth/Throat: Oropharynx is clear and moist. No oropharyngeal exudate.  Eyes: Conjunctivae are normal. Pupils are equal, round, and reactive to light. Right eye exhibits no discharge. Left eye exhibits no discharge. No scleral icterus.  Neck: Normal range of motion. Neck supple. No tracheal deviation present. No thyromegaly present.  Cardiovascular: Normal rate, regular rhythm, normal heart sounds and intact distal pulses.  Exam reveals no gallop and no friction rub.   No murmur heard. Pulmonary/Chest: Effort normal and breath sounds normal. No accessory muscle usage. Not tachypneic. No respiratory distress. She has no decreased breath sounds. She has no wheezes. She has no rhonchi. She has no rales. She exhibits no tenderness.   Musculoskeletal: Normal range of motion. She exhibits no edema and no tenderness.  Lymphadenopathy:    She has no cervical adenopathy.  Neurological: She is alert and oriented to person, place, and time. No cranial nerve deficit. She exhibits normal muscle tone. Coordination normal.  Skin: Skin is warm and dry. No rash noted. She is not diaphoretic. No erythema. No pallor.  Psychiatric: She has a normal mood and affect. Her behavior is normal. Judgment and thought content normal.          Assessment & Plan:

## 2013-06-24 NOTE — Telephone Encounter (Signed)
That is fine if she has not had this vaccine.

## 2013-06-25 MED ORDER — ZOSTER VACCINE LIVE 19400 UNT/0.65ML ~~LOC~~ SOLR: 0.6500 mL | Freq: Once | SUBCUTANEOUS | Status: DC

## 2013-06-25 NOTE — Telephone Encounter (Signed)
Prescription sent to the pharmacy on file

## 2013-07-01 ENCOUNTER — Ambulatory Visit (INDEPENDENT_AMBULATORY_CARE_PROVIDER_SITE_OTHER): Payer: Medicare Other | Admitting: Internal Medicine

## 2013-07-01 ENCOUNTER — Encounter: Payer: Self-pay | Admitting: Internal Medicine

## 2013-07-01 VITALS — BP 140/78 | HR 60 | Temp 97.8°F | Wt 183.0 lb

## 2013-07-01 DIAGNOSIS — M1710 Unilateral primary osteoarthritis, unspecified knee: Secondary | ICD-10-CM

## 2013-07-01 DIAGNOSIS — E119 Type 2 diabetes mellitus without complications: Secondary | ICD-10-CM

## 2013-07-01 DIAGNOSIS — M1712 Unilateral primary osteoarthritis, left knee: Secondary | ICD-10-CM

## 2013-07-01 DIAGNOSIS — IMO0001 Reserved for inherently not codable concepts without codable children: Secondary | ICD-10-CM

## 2013-07-01 DIAGNOSIS — J329 Chronic sinusitis, unspecified: Secondary | ICD-10-CM

## 2013-07-01 NOTE — Assessment & Plan Note (Signed)
Pt reports worsening pain in bilateral left>right knees. Had some improvement with Synvisc in the past. She is a poor candidate for steroid injection because of DM and open wound. Will set up orthopedics evaluation to see if she might be candidate for repeat Synvisc.

## 2013-07-01 NOTE — Assessment & Plan Note (Signed)
Symptoms have nearly completely resolved with additional 7 days of Levaquin. Will continue to monitor for any recurrence.

## 2013-07-01 NOTE — Assessment & Plan Note (Signed)
Lab Results  Component Value Date   HGBA1C 6.4 03/14/2013   Pt reports good control of BG. Will check A1c with labs today.

## 2013-07-01 NOTE — Progress Notes (Signed)
Pre-visit discussion using our clinic review tool. No additional management support is needed unless otherwise documented below in the visit note.  

## 2013-07-01 NOTE — Progress Notes (Signed)
Subjective:    Patient ID: [name], female    DOB: 14-Dec-1928, 79 y.o.   MRN: [id number]  HPI 79YO female with DM, HTN, pancreatic insufficiency, osteoarthritis and recent sinusitis presents for follow up. She reports symptoms of sinus congestion are nearly completely resolved after recent course of Levaquin. She denies fever, chills, nasal congestion, cough, dyspnea.  She is concerned today about recent increased pain in her knees. Pain described as aching, which worsens with activity and weight bearing. Left knee>>right knee. She has been intolerant of NSAIDS. Minimal improvement with prn hydrocodone. She notes that she had Synvisc injection in the distant past with some improvement. She questions whether she might be able to repeat this.  In regards to DM, she notes that her BG have been well controlled. Occasional BG near 200, however this is uncommon. No BG<80. Compliant with medications.  Outpatient Encounter Prescriptions as of 07/01/2013  Medication Sig  . ALPRAZolam (XANAX) 1 MG tablet Take 1/2 tab in am, 1/2 tab at lunch and 1 tab at bedtime as needed  . amLODipine (NORVASC) 2.5 MG tablet Take 1 tablet (2.5 mg total) by mouth daily.  Marland Kitchen aspirin 81 MG tablet Take 81 mg by mouth daily.    . busPIRone (BUSPAR) 10 MG tablet Take 1 tablet (10 mg total) by mouth 2 (two) times daily.  . carbidopa-levodopa (SINEMET CR) 25-100 MG per tablet Take 1 tablet by mouth 3 (three) times daily.  . Carbidopa-Levodopa ER (SINEMET CR) 25-100 MG tablet controlled release Take 1 tablet by mouth 3 (three) times daily.  Marland Kitchen docusate sodium (COLACE) 100 MG capsule Take 100 mg by mouth daily as needed.  . fluticasone (FLONASE) 50 MCG/ACT nasal spray Place 2 sprays into the nose daily.  . furosemide (LASIX) 20 MG tablet Take 1 tablet (20 mg total) by mouth daily.  . Glucosamine-Chondroitin (OSTEO BI-FLEX REGULAR STRENGTH PO) Take 1 tablet by mouth daily.    Marland Kitchen HYDROcodone-acetaminophen (NORCO/VICODIN) 5-325  MG per tablet Take 1 tablet by mouth 3 (three) times daily as needed.  . insulin glargine (LANTUS) 100 UNIT/ML injection Inject 27 Units into the skin at bedtime.   . insulin lispro (HUMALOG) 100 UNIT/ML injection Inject 5-17 Units into the skin 3 (three) times daily before meals.  Marland Kitchen levofloxacin (LEVAQUIN) 500 MG tablet Take 1 tablet (500 mg total) by mouth daily.  Marland Kitchen levothyroxine (SYNTHROID, LEVOTHROID) 75 MCG tablet Take 1 tablet (75 mcg total) by mouth daily.  Marland Kitchen lisinopril (PRINIVIL,ZESTRIL) 20 MG tablet Take 1 tablet (20 mg total) by mouth 2 (two) times daily.  Marland Kitchen loperamide (IMODIUM A-D) 2 MG tablet Take 2 mg by mouth 4 (four) times daily as needed. Non Formula Anti Diarrheal  . metoprolol (TOPROL-XL) 200 MG 24 hr tablet Take 1 tablet (200 mg total) by mouth daily.  . Multiple Vitamins-Minerals (CENTRUM SILVER PO) Take 1 tablet by mouth daily.    Marland Kitchen omeprazole (PRILOSEC) 20 MG capsule Take 1 capsule (20 mg total) by mouth daily.  . ondansetron (ZOFRAN) 4 MG tablet Take 1 tablet (4 mg total) by mouth every 8 (eight) hours as needed.  . Pancrelipase, Lip-Prot-Amyl, 5000 UNITS CPEP Take 3 capsules (15,000 Units total) by mouth 3 (three) times daily.  . pravastatin (PRAVACHOL) 20 MG tablet Take 1 tablet (20 mg total) by mouth daily.  . predniSONE (DELTASONE) 10 MG tablet Take 10 mg by mouth daily.   Marland Kitchen rOPINIRole (REQUIP) 0.25 MG tablet Take 0.25 mg by mouth at bedtime as needed.  Marland Kitchen  Syringe/Needle, Disp, (B-D ECLIPSE SYRINGE) 30G X 1/2" 1 ML MISC as directed.  . triamcinolone cream (KENALOG) 0.1 % Apply topically 2 (two) times daily.  . [DISCONTINUED] zoster vaccine live, PF, (ZOSTAVAX) 16109 UNT/0.65ML injection Inject 19,400 Units into the skin once.   BP 140/78  Pulse 60  Temp(Src) 97.8 F (36.6 C) (Oral)  Wt 183 lb (83.008 kg)  SpO2 96%  Review of Systems  Constitutional: Negative for fever, chills, appetite change, fatigue and unexpected weight change.  HENT: Negative for congestion,  ear pain, sinus pressure, sore throat, trouble swallowing and voice change.   Eyes: Negative for visual disturbance.  Respiratory: Negative for cough, shortness of breath, wheezing and stridor.   Cardiovascular: Negative for chest pain, palpitations and leg swelling.  Gastrointestinal: Negative for nausea, vomiting, abdominal pain, diarrhea, constipation, blood in stool, abdominal distention and anal bleeding.  Genitourinary: Negative for dysuria and flank pain.  Musculoskeletal: Positive for arthralgias, gait problem and joint swelling. Negative for myalgias and neck pain.  Skin: Negative for color change and rash.  Neurological: Negative for dizziness and headaches.  Hematological: Negative for adenopathy. Does not bruise/bleed easily.  Psychiatric/Behavioral: Negative for suicidal ideas, sleep disturbance and dysphoric mood. The patient is not nervous/anxious.        Objective:   Physical Exam  Constitutional: She is oriented to person, place, and time. She appears well-developed and well-nourished. No distress.  HENT:  Head: Normocephalic and atraumatic.  Right Ear: External ear normal.  Left Ear: External ear normal.  Nose: Nose normal.  Mouth/Throat: Oropharynx is clear and moist. No oropharyngeal exudate.  Eyes: Conjunctivae are normal. Pupils are equal, round, and reactive to light. Right eye exhibits no discharge. Left eye exhibits no discharge. No scleral icterus.  Neck: Normal range of motion. Neck supple. No tracheal deviation present. No thyromegaly present.  Cardiovascular: Normal rate, regular rhythm, normal heart sounds and intact distal pulses.  Exam reveals no gallop and no friction rub.   No murmur heard. Pulmonary/Chest: Effort normal and breath sounds normal. No accessory muscle usage. Not tachypneic. No respiratory distress. She has no decreased breath sounds. She has no wheezes. She has no rhonchi. She has no rales. She exhibits no tenderness.  Musculoskeletal: She  exhibits no edema and no tenderness.       Right knee: She exhibits decreased range of motion and swelling.       Left knee: She exhibits decreased range of motion and swelling.  Lymphadenopathy:    She has no cervical adenopathy.  Neurological: She is alert and oriented to person, place, and time. No cranial nerve deficit. She exhibits normal muscle tone. Coordination normal.  Skin: Skin is warm and dry. No rash noted. She is not diaphoretic. No erythema. No pallor.  Psychiatric: She has a normal mood and affect. Her behavior is normal. Judgment and thought content normal.          Assessment & Plan:

## 2013-07-02 LAB — COMPREHENSIVE METABOLIC PANEL
ALT: 8 U/L (ref 0–35)
AST: 17 U/L (ref 0–37)
Albumin: 3.4 g/dL — ABNORMAL LOW (ref 3.5–5.2)
BUN: 16 mg/dL (ref 6–23)
CO2: 30 mEq/L (ref 19–32)
Calcium: 8.9 mg/dL (ref 8.4–10.5)
Chloride: 104 mEq/L (ref 96–112)
Creatinine, Ser: 0.9 mg/dL (ref 0.4–1.2)
GFR: 66.26 mL/min (ref >60.00)
Potassium: 4.1 mEq/L (ref 3.5–5.1)
Total Protein: 6.3 g/dL (ref 6.0–8.3)

## 2013-07-17 ENCOUNTER — Other Ambulatory Visit: Payer: Self-pay | Admitting: *Deleted

## 2013-07-17 MED ORDER — METOPROLOL SUCCINATE ER 200 MG PO TB24: 200.0000 mg | ORAL_TABLET | Freq: Every day | ORAL | Status: DC

## 2013-07-22 ENCOUNTER — Encounter: Payer: Self-pay | Admitting: Cardiothoracic Surgery

## 2013-08-07 ENCOUNTER — Other Ambulatory Visit: Payer: Self-pay | Admitting: *Deleted

## 2013-08-07 MED ORDER — LISINOPRIL 20 MG PO TABS: 20.0000 mg | ORAL_TABLET | Freq: Two times a day (BID) | ORAL | Status: DC

## 2013-08-07 MED ORDER — PRAVASTATIN SODIUM 20 MG PO TABS: 20.0000 mg | ORAL_TABLET | Freq: Every day | ORAL | Status: DC

## 2013-08-07 MED ORDER — AMLODIPINE BESYLATE 2.5 MG PO TABS: 2.5000 mg | ORAL_TABLET | Freq: Every day | ORAL | Status: DC

## 2013-08-09 ENCOUNTER — Ambulatory Visit: Payer: Medicare Other | Admitting: Cardiovascular Disease

## 2013-08-12 ENCOUNTER — Telehealth: Payer: Self-pay | Admitting: Emergency Medicine

## 2013-08-12 DIAGNOSIS — IMO0001 Reserved for inherently not codable concepts without codable children: Secondary | ICD-10-CM

## 2013-08-12 MED ORDER — HYDROCODONE-ACETAMINOPHEN 5-325 MG PO TABS: 1.0000 | ORAL_TABLET | Freq: Three times a day (TID) | ORAL | Status: DC | PRN

## 2013-08-12 NOTE — Telephone Encounter (Signed)
Left message, Rx ready for pickup 

## 2013-08-12 NOTE — Telephone Encounter (Signed)
Rx printed

## 2013-08-12 NOTE — Telephone Encounter (Signed)
Fine to refill 

## 2013-08-12 NOTE — Telephone Encounter (Signed)
HYDROcodone-acetaminophen (NORCO/VICODIN) 5-325 MG per tablet, Pts daughter calling stating they will be needing refill.

## 2013-08-13 ENCOUNTER — Encounter: Payer: Self-pay | Admitting: Cardiovascular Disease

## 2013-08-13 ENCOUNTER — Ambulatory Visit (INDEPENDENT_AMBULATORY_CARE_PROVIDER_SITE_OTHER): Payer: Medicare Other | Admitting: Cardiovascular Disease

## 2013-08-13 VITALS — BP 195/85 | HR 63 | Ht 59.0 in | Wt 180.8 lb

## 2013-08-13 DIAGNOSIS — R0602 Shortness of breath: Secondary | ICD-10-CM

## 2013-08-13 DIAGNOSIS — I10 Essential (primary) hypertension: Secondary | ICD-10-CM

## 2013-08-13 DIAGNOSIS — R0609 Other forms of dyspnea: Secondary | ICD-10-CM

## 2013-08-13 DIAGNOSIS — R0600 Dyspnea, unspecified: Secondary | ICD-10-CM

## 2013-08-13 MED ORDER — METOPROLOL SUCCINATE ER 100 MG PO TB24: 100.0000 mg | ORAL_TABLET | Freq: Every day | ORAL | Status: DC

## 2013-08-13 NOTE — Progress Notes (Signed)
Primary care physician: Dr. Ronna Polio Referring physician: Dr. Thedore Mins  HPI  This is an 79 year old female who was referred by Dr. Thedore Mins for evaluation of shortness of breath and dizziness. She has known history of  DM, refractory HTN, pancreatic insufficiency, and osteoarthritis . She has been having worsening osteoarthritis. She had a routine visit recently with Dr. Thedore Mins for hypertension. While she was there, she became sick and dizzy with possible presyncope. She has been experiencing dyspnea with minimal activities with no chest discomfort. She has known history of congestive heart failure with normal LV systolic function. Most recent echocardiogram was in April of this year which showed an EF of 55-60%, mild left ventricular hypertrophy and grade 1 diastolic dysfunction. She reports no recent stress testing or other ischemic cardiac evaluation. She denies any syncope.   Allergies  Allergen Reactions  . Citalopram Hydrobromide     REACTION: nausea, dizzy, dry mouth  . Diphenhydramine Hcl     REACTION: unspecified  . Metoclopramide     Other reaction(s): Unknown  . Metoclopramide Hcl     REACTION: unspecified  . Paroxetine     REACTION: hallucinations  . Tape Other (See Comments) and Rash     Current Outpatient Prescriptions on File Prior to Visit  Medication Sig Dispense Refill  . ALPRAZolam (XANAX) 1 MG tablet Take 1/2 tab in am, 1/2 tab at lunch and 1 tab at bedtime as needed  60 tablet  4  . amLODipine (NORVASC) 2.5 MG tablet Take 1 tablet (2.5 mg total) by mouth daily.  90 tablet  1  . aspirin 81 MG tablet Take 81 mg by mouth daily.        . busPIRone (BUSPAR) 10 MG tablet Take 1 tablet (10 mg total) by mouth 2 (two) times daily.  180 tablet  1  . Carbidopa-Levodopa ER (SINEMET CR) 25-100 MG tablet controlled release Take 1 tablet by mouth 3 (three) times daily.  270 tablet  1  . fluticasone (FLONASE) 50 MCG/ACT nasal spray Place 2 sprays into the nose daily.  16 g  6  .  furosemide (LASIX) 20 MG tablet Take 1 tablet (20 mg total) by mouth daily.  90 tablet  1  . Glucosamine-Chondroitin (OSTEO BI-FLEX REGULAR STRENGTH PO) Take 1 tablet by mouth 2 (two) times daily.       . insulin glargine (LANTUS) 100 UNIT/ML injection Inject 27 Units into the skin at bedtime.       . insulin lispro (HUMALOG) 100 UNIT/ML injection Inject 5-17 Units into the skin 3 (three) times daily before meals.      Marland Kitchen levothyroxine (SYNTHROID, LEVOTHROID) 75 MCG tablet Take 1 tablet (75 mcg total) by mouth daily.  30 tablet  5  . lisinopril (PRINIVIL,ZESTRIL) 20 MG tablet Take 1 tablet (20 mg total) by mouth 2 (two) times daily.  180 tablet  1  . Multiple Vitamins-Minerals (CENTRUM SILVER PO) Take 1 tablet by mouth daily.        Marland Kitchen omeprazole (PRILOSEC) 20 MG capsule Take 1 capsule (20 mg total) by mouth daily.  90 capsule  0  . ondansetron (ZOFRAN) 4 MG tablet Take 1 tablet (4 mg total) by mouth every 8 (eight) hours as needed.  90 tablet  11  . Pancrelipase, Lip-Prot-Amyl, 5000 UNITS CPEP Take 3 capsules (15,000 Units total) by mouth 3 (three) times daily.  270 capsule  11  . pravastatin (PRAVACHOL) 20 MG tablet Take 1 tablet (20 mg total) by mouth daily.  90 tablet  1  . predniSONE (DELTASONE) 10 MG tablet Take 10 mg by mouth daily.       . Syringe/Needle, Disp, (B-D ECLIPSE SYRINGE) 30G X 1/2" 1 ML MISC as directed.  100 each  6  . triamcinolone cream (KENALOG) 0.1 % Apply topically 2 (two) times daily.  30 g  0   No current facility-administered medications on file prior to visit.     Past Medical History  Diagnosis Date  . Anxiety   . CHF (congestive heart failure)   . Depression   . Diabetes mellitus     Followed by Dr. Tedd Sias at [facility]  . GERD (gastroesophageal reflux disease)   . Hypertension   . Hypothyroidism   . Osteoporosis   . Allergy   . Hyperlipidemia   . RLS (restless legs syndrome)   . Pancreatic disease     pancreatic failure  . Urinary incontinence   .  Pancreatitis     Pancreatic resection/ open wound- [facility] 11/04  . Pneumonia     Pneumonia/ Emphysema 05/05  . Diplopia     Diplopia- ? 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 History of pleural empyema   . Sepsis   . Frequent falls   . Arthritis     left knee, [facility]  . Empyema lung      Past Surgical History  Procedure Laterality Date  . Breast lumpectomy      Lumpectomy right breast  . 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 Abdominal hysterectomy      Hysteroscopy/ D & C ([place]) 12/05  . Removal of pancreas  2004  . Kyphoplasty  2005  . Broken shoulder and orbital bone  2005  . Cholecystectomy  1973  . Thoracentesis    . Vaginal delivery      9     Family History  Problem Relation Age of Onset  . Heart disease Mother   . Heart attack Mother   . Hypertension Mother   . Heart attack Father      History   Social History  . Marital Status: Widowed    Spouse Name: N/A    Number of Children: 7  . Years of Education: 6   Occupational History  . retired- Veterinary surgeon    Social History Main Topics  . Smoking status: Never Smoker   . Smokeless tobacco: Never Used  . Alcohol Use: No  . Drug Use: No  . Sexual Activity: Not on file   Other Topics Concern  . Not on file   Social History Narrative   Lives in home alone, has nursing aid, and alert device in [place]. Has 7 children.            Has [name], daughter, as health care POA   Not sure about DNR   Permission to speak to daughter-in-law, Evaleen Sant, regarding health care matters.     ROS A 10 point review of system was performed. It is negative other than that mentioned in the history of present illness.   PHYSICAL EXAM   BP 195/85  Pulse 63  Ht 4\' 11"  (1.499 m)  Wt 180 lb 12 oz (81.988 kg)  BMI 36.49 kg/m2 Constitutional: She is oriented to person, place, and time. She appears  well-developed and well-nourished. No distress.  HENT: No nasal discharge.  Head: Normocephalic and atraumatic.  Eyes: Pupils are equal and round. No discharge.  Neck: Normal range of motion. Neck supple. No JVD present. No thyromegaly present.  Cardiovascular: Normal rate, regular rhythm, normal heart sounds. Exam reveals no gallop and no friction rub. No murmur heard.  Pulmonary/Chest: Effort normal and breath sounds normal. No stridor. No respiratory distress. She has no wheezes. She has no rales. She exhibits no tenderness.  Abdominal: Soft. Bowel sounds are normal. She exhibits no distension. There is no tenderness. There is no rebound and no guarding.  Musculoskeletal: Normal range of motion. She exhibits no edema and no tenderness.  Neurological: She is alert and oriented to person, place, and time. Coordination normal.  Skin: Skin is warm and dry. No rash noted. She is not diaphoretic. No erythema. No pallor.  Psychiatric: She has a normal mood and affect. Her behavior is normal. Judgment and thought content normal.     EKG: Normal sinus rhythm with first degree AV block.   ASSESSMENT AND PLAN

## 2013-08-13 NOTE — Patient Instructions (Addendum)
Your physician has recommended you make the following change in your medication:  Decrease metoprolol to 100 mg   [facility] MYOVIEW  Your caregiver has ordered a Stress Test with nuclear imaging. The purpose of this test is to evaluate the blood supply to your heart muscle. This procedure is referred to as a "Non-Invasive Stress Test." This is because other than having an IV started in your vein, nothing is inserted or "invades" your body. Cardiac stress tests are done to find areas of poor blood flow to the heart by determining the extent of coronary artery disease (CAD). Some patients exercise on a treadmill, which naturally increases the blood flow to your heart, while others who are  unable to walk on a treadmill due to physical limitations have a pharmacologic/chemical stress agent called Lexiscan . This medicine will mimic walking on a treadmill by temporarily increasing your coronary blood flow.   Please note: these test may take anywhere between 2-4 hours to complete  PLEASE REPORT TO [facility] MEDICAL MALL ENTRANCE  THE VOLUNTEERS AT THE FIRST DESK WILL DIRECT YOU WHERE TO GO  Date of Procedure:___________01/07/15__________________________  Arrival Time for Procedure:________09:45 AM______________________  Instructions regarding medication:   __x_ : Hold diabetes medication morning of procedure  __x__:  Hold betablocker(s) night before procedure and morning of procedure  PLEASE NOTIFY THE OFFICE AT LEAST 24 HOURS IN [facility] IF YOU ARE UNABLE TO KEEP YOUR APPOINTMENT.  [phone] AND  PLEASE NOTIFY NUCLEAR MEDICINE AT [facility] AT LEAST 24 HOURS IN [facility] IF YOU ARE UNABLE TO KEEP YOUR APPOINTMENT. [phone]  How to prepare for your Myoview test:  1. Do not eat or drink after midnight 2. No caffeine for 24 hours prior to test 3. No smoking 24 hours prior to test. 4. Your medication may be taken with water.  If your doctor stopped a medication because of this test, do not take that  medication. 5. Ladies, please do not wear dresses.  Skirts or pants are appropriate. Please wear a short sleeve shirt. 6. No perfume, cologne or lotion. 7. Wear comfortable walking shoes. No heels!  Your physician recommends that you schedule a follow-up appointment in: after lexiscan

## 2013-08-14 ENCOUNTER — Encounter: Payer: Self-pay | Admitting: Cardiovascular Disease

## 2013-08-15 NOTE — Assessment & Plan Note (Addendum)
The patient has refractory hypertension with recent presyncopal episode. EKG shows sinus rhythm with first-degree AV block. Heart rate is somewhat on the low side. Thus, I will decrease the dose of Toprol to 100 mg once daily. Blood pressure is very elevated. I will plan on switching her gradually from Toprol to carvedilol. Spironolactone can also be considered.

## 2013-08-15 NOTE — Assessment & Plan Note (Signed)
This is likely due to chronic diastolic heart failure and physical deconditioning. However, there has been recent worsening and she has multiple risk factors for coronary artery disease. Thus, I recommend evaluation with a pharmacologic nuclear stress test. She's not able to exercise on a treadmill due to significant arthritis.

## 2013-08-17 ENCOUNTER — Other Ambulatory Visit: Payer: Self-pay | Admitting: Internal Medicine

## 2013-08-26 ENCOUNTER — Inpatient Hospital Stay: Payer: Self-pay | Admitting: Specialist

## 2013-08-26 DIAGNOSIS — I509 Heart failure, unspecified: Secondary | ICD-10-CM

## 2013-08-26 LAB — CBC
HCT: 40 % (ref 35.0–47.0)
HGB: 13.4 g/dL (ref 12.0–16.0)
MCH: 29.8 pg (ref 26.0–34.0)
MCHC: 33.5 g/dL (ref 32.0–36.0)
MCV: 89 fL (ref 80–100)
Platelet: 77 10*3/uL — ABNORMAL LOW (ref 150–440)
RBC: 4.5 10*6/uL (ref 3.80–5.20)
RDW: 13.9 % (ref 11.5–14.5)
WBC: 5.2 10*3/uL (ref 3.6–11.0)

## 2013-08-26 LAB — CK TOTAL AND CKMB (NOT AT ARMC)
CK, Total: 28 U/L (ref 21–215)
CK-MB: 1.1 ng/mL (ref 0.5–3.6)

## 2013-08-26 LAB — BASIC METABOLIC PANEL
Anion Gap: 3 — ABNORMAL LOW (ref 7–16)
BUN: 16 mg/dL (ref 7–18)
CALCIUM: 8.8 mg/dL (ref 8.5–10.1)
CHLORIDE: 106 mmol/L (ref 98–107)
Co2: 33 mmol/L — ABNORMAL HIGH (ref 21–32)
Creatinine: 0.94 mg/dL (ref 0.60–1.30)
EGFR (African American): >60
GFR CALC NON AF AMER: 56 — AB
Glucose: 103 mg/dL — ABNORMAL HIGH (ref 65–99)
Osmolality: 285 (ref 275–301)
POTASSIUM: 3.9 mmol/L (ref 3.5–5.1)
Sodium: 142 mmol/L (ref 136–145)

## 2013-08-26 LAB — TROPONIN I: Troponin-I: <0.02 ng/mL

## 2013-08-26 LAB — PRO B NATRIURETIC PEPTIDE: B-Type Natriuretic Peptide: 1015 pg/mL — ABNORMAL HIGH (ref 0–450)

## 2013-08-26 LAB — CK-MB: CK-MB: 0.8 ng/mL (ref 0.5–3.6)

## 2013-08-27 DIAGNOSIS — R0989 Other specified symptoms and signs involving the circulatory and respiratory systems: Secondary | ICD-10-CM

## 2013-08-27 DIAGNOSIS — I10 Essential (primary) hypertension: Secondary | ICD-10-CM

## 2013-08-27 DIAGNOSIS — I5033 Acute on chronic diastolic (congestive) heart failure: Secondary | ICD-10-CM

## 2013-08-27 DIAGNOSIS — I2510 Atherosclerotic heart disease of native coronary artery without angina pectoris: Secondary | ICD-10-CM

## 2013-08-27 DIAGNOSIS — R0609 Other forms of dyspnea: Secondary | ICD-10-CM

## 2013-08-27 LAB — BASIC METABOLIC PANEL
Anion Gap: 5 — ABNORMAL LOW (ref 7–16)
BUN: 17 mg/dL (ref 7–18)
CREATININE: 1.06 mg/dL (ref 0.60–1.30)
Calcium, Total: 8.6 mg/dL (ref 8.5–10.1)
Chloride: 100 mmol/L (ref 98–107)
Co2: 32 mmol/L (ref 21–32)
EGFR (African American): 57 — ABNORMAL LOW
GFR CALC NON AF AMER: 49 — AB
GLUCOSE: 177 mg/dL — AB (ref 65–99)
OSMOLALITY: 280 (ref 275–301)
POTASSIUM: 3.5 mmol/L (ref 3.5–5.1)
Sodium: 137 mmol/L (ref 136–145)

## 2013-08-27 LAB — CBC WITH DIFFERENTIAL/PLATELET
BASOS PCT: 0.3 %
Basophil #: 0 10*3/uL (ref 0.0–0.1)
Eosinophil #: 0.2 10*3/uL (ref 0.0–0.7)
Eosinophil %: 3.1 %
HCT: 37.7 % (ref 35.0–47.0)
HGB: 12.8 g/dL (ref 12.0–16.0)
LYMPHS ABS: 1.3 10*3/uL (ref 1.0–3.6)
Lymphocyte %: 21.6 %
MCH: 30.3 pg (ref 26.0–34.0)
MCHC: 34 g/dL (ref 32.0–36.0)
MCV: 89 fL (ref 80–100)
Monocyte #: 0.6 x10 3/mm (ref 0.2–0.9)
Monocyte %: 9.9 %
NEUTROS ABS: 3.9 10*3/uL (ref 1.4–6.5)
Neutrophil %: 65.1 %
Platelet: 95 10*3/uL — ABNORMAL LOW (ref 150–440)
RBC: 4.22 10*6/uL (ref 3.80–5.20)
RDW: 14.1 % (ref 11.5–14.5)
WBC: 5.9 10*3/uL (ref 3.6–11.0)

## 2013-08-27 LAB — CK-MB
CK-MB: 2.1 ng/mL (ref 0.5–3.6)
CK-MB: 0.8 ng/mL (ref 0.5–3.6)

## 2013-08-27 LAB — TROPONIN I: Troponin-I: <0.02 ng/mL

## 2013-08-29 ENCOUNTER — Encounter: Payer: Self-pay | Admitting: Cardiovascular Disease

## 2013-08-29 DIAGNOSIS — I2510 Atherosclerotic heart disease of native coronary artery without angina pectoris: Secondary | ICD-10-CM

## 2013-08-29 HISTORY — PX: CARDIAC CATHETERIZATION: SHX172

## 2013-08-30 LAB — BASIC METABOLIC PANEL
ANION GAP: 5 — AB (ref 7–16)
BUN: 13 mg/dL (ref 7–18)
CHLORIDE: 101 mmol/L (ref 98–107)
CO2: 28 mmol/L (ref 21–32)
CREATININE: 1.08 mg/dL (ref 0.60–1.30)
Calcium, Total: 8.5 mg/dL (ref 8.5–10.1)
GFR CALC AF AMER: 55 — AB
GFR CALC NON AF AMER: 48 — AB
Glucose: 170 mg/dL — ABNORMAL HIGH (ref 65–99)
Osmolality: 272 (ref 275–301)
POTASSIUM: 3.9 mmol/L (ref 3.5–5.1)
SODIUM: 134 mmol/L — AB (ref 136–145)

## 2013-08-30 LAB — CK: CK, TOTAL: 55 U/L (ref 21–215)

## 2013-08-30 LAB — CK TOTAL AND CKMB (NOT AT ARMC)
CK, Total: 50 U/L (ref 21–215)
CK-MB: 4.9 ng/mL — AB (ref 0.5–3.6)

## 2013-08-31 LAB — CULTURE, BLOOD (SINGLE)

## 2013-09-05 ENCOUNTER — Encounter (INDEPENDENT_AMBULATORY_CARE_PROVIDER_SITE_OTHER): Payer: Medicare Other | Admitting: Cardiovascular Disease

## 2013-09-05 ENCOUNTER — Encounter: Payer: Self-pay | Admitting: Cardiovascular Disease

## 2013-09-05 VITALS — BP 164/96 | HR 63 | Ht 59.0 in | Wt 180.2 lb

## 2013-09-05 DIAGNOSIS — I10 Essential (primary) hypertension: Secondary | ICD-10-CM

## 2013-09-10 ENCOUNTER — Telehealth: Payer: Self-pay

## 2013-09-10 NOTE — Telephone Encounter (Signed)
Nurse with [facility] called and states pt has had a 2 pound weight gain overnight, please call.

## 2013-09-10 NOTE — Progress Notes (Signed)
This encounter was created in error - please disregard.

## 2013-09-10 NOTE — Telephone Encounter (Signed)
Patient stated that she gained 2 lbs overnight and has swollen ankles this morning. She denies shortness of breath. She admitted to eating a boiled egg with salt, potato chips and salted nuts yesterday. Patient educated on low sodium diet and instructed to manage her salt intake. Patient verbalized understanding.

## 2013-09-11 NOTE — Telephone Encounter (Signed)
You already took care of this. Continue to monitor weight.

## 2013-09-12 ENCOUNTER — Encounter: Payer: Self-pay | Admitting: Internal Medicine

## 2013-09-12 ENCOUNTER — Ambulatory Visit (INDEPENDENT_AMBULATORY_CARE_PROVIDER_SITE_OTHER): Payer: Medicare Other | Admitting: Internal Medicine

## 2013-09-12 ENCOUNTER — Ambulatory Visit (INDEPENDENT_AMBULATORY_CARE_PROVIDER_SITE_OTHER)
Admission: RE | Admit: 2013-09-12 | Discharge: 2013-09-12 | Disposition: A | Discharge status: Home / Self Care | Payer: Medicare Other | Source: Ambulatory Visit | Attending: Internal Medicine | Admitting: Internal Medicine

## 2013-09-12 VITALS — BP 120/78 | HR 65 | Temp 97.9°F | Wt 184.0 lb

## 2013-09-12 DIAGNOSIS — R0989 Other specified symptoms and signs involving the circulatory and respiratory systems: Secondary | ICD-10-CM

## 2013-09-12 DIAGNOSIS — R0600 Dyspnea, unspecified: Secondary | ICD-10-CM

## 2013-09-12 DIAGNOSIS — J189 Pneumonia, unspecified organism: Secondary | ICD-10-CM

## 2013-09-12 DIAGNOSIS — Z23 Encounter for immunization: Secondary | ICD-10-CM

## 2013-09-12 DIAGNOSIS — E119 Type 2 diabetes mellitus without complications: Secondary | ICD-10-CM

## 2013-09-12 DIAGNOSIS — I10 Essential (primary) hypertension: Secondary | ICD-10-CM

## 2013-09-12 DIAGNOSIS — T8131XA Disruption of external operation (surgical) wound, not elsewhere classified, initial encounter: Secondary | ICD-10-CM

## 2013-09-12 DIAGNOSIS — I2510 Atherosclerotic heart disease of native coronary artery without angina pectoris: Secondary | ICD-10-CM

## 2013-09-12 DIAGNOSIS — R0609 Other forms of dyspnea: Secondary | ICD-10-CM

## 2013-09-12 LAB — COMPREHENSIVE METABOLIC PANEL
ALBUMIN: 3.5 g/dL (ref 3.5–5.2)
ALT: 6 U/L (ref 0–35)
AST: 15 U/L (ref 0–37)
Alkaline Phosphatase: 92 U/L (ref 39–117)
BUN: 18 mg/dL (ref 6–23)
CO2: 31 mEq/L (ref 19–32)
Calcium: 8.8 mg/dL (ref 8.4–10.5)
Chloride: 106 mEq/L (ref 96–112)
Creatinine, Ser: 0.9 mg/dL (ref 0.4–1.2)
GFR: 66.23 mL/min (ref >60.00)
Glucose, Bld: 79 mg/dL (ref 70–99)
POTASSIUM: 4.1 meq/L (ref 3.5–5.1)
SODIUM: 144 meq/L (ref 135–145)
Total Bilirubin: 0.5 mg/dL (ref 0.3–1.2)
Total Protein: 6.4 g/dL (ref 6.0–8.3)

## 2013-09-12 LAB — CBC WITH DIFFERENTIAL/PLATELET
BASOS PCT: 0.3 % (ref 0.0–3.0)
Basophils Absolute: 0 10*3/uL (ref 0.0–0.1)
EOS PCT: 2.6 % (ref 0.0–5.0)
Eosinophils Absolute: 0.1 10*3/uL (ref 0.0–0.7)
HCT: 37.2 % (ref 36.0–46.0)
Hemoglobin: 12.4 g/dL (ref 12.0–15.0)
LYMPHS ABS: 1.4 10*3/uL (ref 0.7–4.0)
Lymphocytes Relative: 26.7 % (ref 12.0–46.0)
MCHC: 33.2 g/dL (ref 30.0–36.0)
MCV: 89.3 fl (ref 78.0–100.0)
Monocytes Absolute: 0.5 10*3/uL (ref 0.1–1.0)
Monocytes Relative: 9.4 % (ref 3.0–12.0)
NEUTROS ABS: 3.2 10*3/uL (ref 1.4–7.7)
Neutrophils Relative %: 61 % (ref 43.0–77.0)
PLATELETS: 101 10*3/uL — AB (ref 150.0–400.0)
RBC: 4.17 Mil/uL (ref 3.87–5.11)
RDW: 14.1 % (ref 11.5–14.6)
WBC: 5.3 10*3/uL (ref 4.5–10.5)

## 2013-09-12 LAB — HEMOGLOBIN A1C: Hgb A1c MFr Bld: 6.8 % — ABNORMAL HIGH (ref 4.6–6.5)

## 2013-09-12 MED ORDER — ALBUTEROL SULFATE HFA 108 (90 BASE) MCG/ACT IN AERS: 2.0000 | INHALATION_SPRAY | Freq: Four times a day (QID) | RESPIRATORY_TRACT | Status: DC | PRN

## 2013-09-12 NOTE — Telephone Encounter (Signed)
Patient had called 1/20 and had gained 2 lbs after eating a lot of sodium. She came back down 2 lbs and then gained 4 overnight last night. Denies shortness of breath. Stated that her ankles were "a little swollen". Her blood pressure taken by home health nurse today was 180/90. Patient stated that she did follow her low sodium diet yesterday. She had "beans with fat back in them".

## 2013-09-12 NOTE — Telephone Encounter (Signed)
Nurse called back, states pt has had a 4 pound weight gain overnight. Please call.

## 2013-09-12 NOTE — Telephone Encounter (Signed)
Informed patient "She has an appointment with Dr. Dan HumphreysWalker today. Make sure she keeps it. Continue with low sodium diet. Follow up with me next week as planned." Patient verbalized understanding. Reviewed low sodium diet again with patient.

## 2013-09-12 NOTE — Telephone Encounter (Signed)
She has an appointment with Dr. Dan HumphreysWalker today. Make sure she keeps it. Continue with low sodium diet. Follow up with me next week as planned.

## 2013-09-12 NOTE — Progress Notes (Signed)
Pre-visit discussion using our clinic review tool. No additional management support is needed unless otherwise documented below in the visit note.  

## 2013-09-13 NOTE — Assessment & Plan Note (Signed)
BP Readings from Last 3 Encounters:  09/12/13 120/78  09/05/13 164/96  08/13/13 195/85   Blood pressure well-controlled on current medications. Will continue.

## 2013-09-13 NOTE — Progress Notes (Signed)
Subjective:    Patient ID: [name], female    DOB: 09/05/1928, 80 y.o.   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 She reports that she is generally feeling well. She denies any symptoms of chest pain or shortness of breath except for minimal shortness of breath with exertion. She reports blood sugars have been well-controlled, typically near 120. She is compliant with medications. No recurrent symptoms of fever.   She is concerned because she was discharged from the wound care clinic. She has a chronic open abdominal wound which requires daily dressing changes. She is currently having some assistance with home health. This wound has been present for over one year. There's been minimal change in the size of the wound.  Outpatient Encounter Prescriptions as of 09/12/2013  Medication Sig  . ALPRAZolam (XANAX) 1 MG tablet Take 1/2 tab in am, 1/2 tab at lunch and 1 tab at bedtime as needed  . amLODipine (NORVASC) 2.5 MG tablet Take 1 tablet (2.5 mg total) by mouth daily.  Marland Kitchen aspirin 81 MG tablet Take 81 mg by mouth daily.    . busPIRone (BUSPAR) 10 MG tablet Take 1 tablet (10 mg total) by mouth 2 (two) times daily.  . Carbidopa-Levodopa ER (SINEMET CR) 25-100 MG tablet controlled release Take 1 tablet by mouth 3 (three) times daily.  . cloNIDine (CATAPRES) 0.1 MG tablet Take 0.1 mg by mouth 2 (two) times daily.   . clopidogrel (PLAVIX) 75 MG tablet Take 75 mg by mouth daily with breakfast.  . fluticasone (FLONASE) 50 MCG/ACT nasal spray Place 2 sprays into the nose daily.  . furosemide (LASIX) 20 MG tablet TAKE ONE (1) TABLET EACH DAY  . HYDROcodone-acetaminophen (NORCO/VICODIN) 5-325 MG per tablet Take 1 tablet by mouth 3 (three) times daily as needed.   . insulin  glargine (LANTUS) 100 UNIT/ML injection Inject 27 Units into the skin at bedtime.   . insulin lispro (HUMALOG) 100 UNIT/ML injection Inject 5-17 Units into the skin 3 (three) times daily before meals.  Marland Kitchen levothyroxine (SYNTHROID, LEVOTHROID) 75 MCG tablet Take 1 tablet (75 mcg total) by mouth daily.  Marland Kitchen lisinopril (PRINIVIL,ZESTRIL) 20 MG tablet Take 1 tablet (20 mg total) by mouth 2 (two) times daily.  . metoprolol succinate (TOPROL-XL) 100 MG 24 hr tablet Take 1 tablet (100 mg total) by mouth daily. Take with or immediately following a meal.  . Multiple Vitamins-Minerals (CENTRUM SILVER PO) Take 1 tablet by mouth daily.    Marland Kitchen omeprazole (PRILOSEC) 20 MG capsule Take 1 capsule (20 mg total) by mouth daily.  . ondansetron (ZOFRAN) 4 MG tablet Take 1 tablet (4 mg total) by mouth every 8 (eight) hours as needed.  . Pancrelipase, Lip-Prot-Amyl, 5000 UNITS CPEP Take 3 capsules (15,000 Units total) by mouth 3 (three) times daily.  . pravastatin (PRAVACHOL) 20 MG tablet Take 1 tablet (20 mg total) by mouth daily.  Marland Kitchen rOPINIRole (REQUIP) 0.25 MG tablet Take 0.25 mg by mouth at bedtime.  . Syringe/Needle, Disp, (B-D ECLIPSE SYRINGE) 30G X 1/2" 1 ML MISC as directed.  . triamcinolone cream (KENALOG) 0.1 % Apply topically 2 (two) times daily.  Marland Kitchen albuterol (PROVENTIL HFA;VENTOLIN HFA) 108 (90 BASE) MCG/ACT inhaler Inhale 2 puffs into the lungs every 6 (six) hours as needed for wheezing or shortness of breath.   BP 120/78  Pulse 65  Temp(Src) 97.9 F (36.6 C) (Oral)  Wt 184 lb (83.462 kg)  SpO2 97%  Review of Systems  Constitutional: Negative for fever, chills, appetite change, fatigue and unexpected weight change.  HENT: Negative for congestion, ear pain, sinus pressure, sore throat, trouble swallowing and voice change.   Eyes: Negative for visual disturbance.  Respiratory: Positive for shortness of breath (occasional with exertion, improved). Negative for cough, wheezing and stridor.   Cardiovascular:  Negative for chest pain, palpitations and leg swelling.  Gastrointestinal: Negative for nausea, vomiting, abdominal pain, diarrhea, constipation, blood in stool, abdominal distention and anal bleeding.  Genitourinary: Negative for dysuria and flank pain.  Musculoskeletal: Negative for arthralgias, gait problem, myalgias and neck pain.  Skin: Negative for color change and rash.  Neurological: Negative for dizziness and headaches.  Hematological: Negative for adenopathy. Does not bruise/bleed easily.  Psychiatric/Behavioral: Negative for suicidal ideas, sleep disturbance and dysphoric mood. The patient is not nervous/anxious.        Objective:   Physical Exam  Constitutional: She is oriented to person, place, and time. She appears well-developed and well-nourished. No distress.  HENT:  Head: Normocephalic and atraumatic.  Right Ear: External ear normal.  Left Ear: External ear normal.  Nose: Nose normal.  Mouth/Throat: Oropharynx is clear and moist. No oropharyngeal exudate.  Eyes: Conjunctivae are normal. Pupils are equal, round, and reactive to light. Right eye exhibits no discharge. Left eye exhibits no discharge. No scleral icterus.  Neck: Normal range of motion. Neck supple. No tracheal deviation present. No thyromegaly present.  Cardiovascular: Normal rate, regular rhythm, normal heart sounds and intact distal pulses.  Exam reveals no gallop and no friction rub.   No murmur heard. Pulmonary/Chest: Effort normal and breath sounds normal. No accessory muscle usage. Not tachypneic. No respiratory distress. She has no decreased breath sounds. She has no wheezes. She has no rhonchi. She has no rales. She exhibits no tenderness.  Abdominal: Soft.    Musculoskeletal: Normal range of motion. She exhibits no edema and no tenderness.  Lymphadenopathy:    She has no cervical adenopathy.  Neurological: She is alert and oriented to person, place, and time. No cranial nerve deficit. She exhibits  normal muscle tone. Coordination normal.  Skin: Skin is warm and dry. No rash noted. She is not diaphoretic. No erythema. No pallor.  Psychiatric: She has a normal mood and affect. Her behavior is normal. Judgment and thought content normal.          Assessment & Plan:  Over of which >50% spent in face-to-face contact with patient and her son discussing plan of care and reviewing chronic medical issues.

## 2013-09-13 NOTE — Assessment & Plan Note (Signed)
Chronic open abdominal wound. Plan to continue home health assistance with dressing changes. Will make a followup referral to wound clinic. She will need long-term care and wound management.

## 2013-09-13 NOTE — Assessment & Plan Note (Signed)
Currently asymptomatic with no chest pain or dyspnea. We discussed the importance of continuing daily Plavix. Continue statin, metoprolol, lisinopril, aspirin. Followup with cardiology as scheduled.

## 2013-09-13 NOTE — Assessment & Plan Note (Signed)
Lab Results  Component Value Date   HGBA1C 6.8* 09/12/2013   Blood sugars. Well-controlled with current insulin regimen. We'll continue.

## 2013-09-13 NOTE — Assessment & Plan Note (Signed)
Symptoms of shortness of breath have significantly improved after her hospitalization and treatment for pneumonia and hyperkalemia. She appears euvolemic on exam today. Will get followup chest x-ray to ensure clearance of infiltrate. Will restart albuterol inhaler for her to use as needed with exertion.

## 2013-09-16 ENCOUNTER — Other Ambulatory Visit: Payer: Self-pay | Admitting: Internal Medicine

## 2013-09-18 ENCOUNTER — Telehealth: Payer: Self-pay | Admitting: Internal Medicine

## 2013-09-18 NOTE — Telephone Encounter (Signed)
Pt needs refill of vicodin.  Pharmacy told pt to pick up written copy.   Please contact Kendal HymenBonnie when ready for pick up.

## 2013-09-18 NOTE — Telephone Encounter (Signed)
Could you please refill patient medication, her last refill was on

## 2013-09-18 NOTE — Telephone Encounter (Signed)
This medication was last refilled by another physician and this is a controlled medication, please advise if refill is adequate.

## 2013-09-19 ENCOUNTER — Ambulatory Visit: Payer: Medicare Other | Admitting: Cardiovascular Disease

## 2013-09-23 ENCOUNTER — Encounter: Payer: Self-pay | Admitting: Cardiovascular Disease

## 2013-09-23 ENCOUNTER — Ambulatory Visit (INDEPENDENT_AMBULATORY_CARE_PROVIDER_SITE_OTHER): Payer: Medicare Other | Admitting: Cardiovascular Disease

## 2013-09-23 VITALS — BP 140/78 | HR 60 | Ht 59.0 in | Wt 182.2 lb

## 2013-09-23 DIAGNOSIS — I2510 Atherosclerotic heart disease of native coronary artery without angina pectoris: Secondary | ICD-10-CM

## 2013-09-23 DIAGNOSIS — E785 Hyperlipidemia, unspecified: Secondary | ICD-10-CM

## 2013-09-23 DIAGNOSIS — R0989 Other specified symptoms and signs involving the circulatory and respiratory systems: Secondary | ICD-10-CM

## 2013-09-23 DIAGNOSIS — R0609 Other forms of dyspnea: Secondary | ICD-10-CM

## 2013-09-23 DIAGNOSIS — I10 Essential (primary) hypertension: Secondary | ICD-10-CM

## 2013-09-23 DIAGNOSIS — R0600 Dyspnea, unspecified: Secondary | ICD-10-CM

## 2013-09-23 MED ORDER — HYDROCODONE-ACETAMINOPHEN 5-325 MG PO TABS: 1.0000 | ORAL_TABLET | Freq: Three times a day (TID) | ORAL | Status: DC | PRN

## 2013-09-23 MED ORDER — AMLODIPINE BESYLATE 5 MG PO TABS: 5.0000 mg | ORAL_TABLET | Freq: Every day | ORAL | Status: DC

## 2013-09-23 NOTE — Patient Instructions (Addendum)
Do not use Aleve, Ibuprofen or any other non steroidal anti inflammatory medications due to being on Aspirin and Plavix. Can use Tylenol as needed.    Your physician has recommended you make the following change in your medication:  Stop Clonidine  Increase Norvasc to 5 mg daily   Your physician recommends that you schedule a follow-up appointment in:  3 months

## 2013-09-23 NOTE — Telephone Encounter (Signed)
Will you please refill this medication for patient?

## 2013-09-23 NOTE — Telephone Encounter (Signed)
Ok to refill,  printed rx  

## 2013-09-23 NOTE — Addendum Note (Signed)
Addended by: Sherlene ShamsULLO, Yanis Larin L on: 09/23/2013 02:59 PM   Modules accepted: Orders

## 2013-09-23 NOTE — Progress Notes (Signed)
Primary care physician: Dr. Ronna Polio   HPI  This is an 80 year old female who is here today for a followup visit after recent hospitalization at [facility]  She has known history of  DM, refractory HTN, pancreatic insufficiency, and osteoarthritis . She was seen recently for dizziness and exertional dyspnea. Previous echocardiogram showed normal LV systolic function. She was scheduled for an outpatient stress test. However, she was admitted on January 6 with worsening dyspnea. She was noted to have right lower lobe pneumonia. She was seated with antibiotics. BNP was mildly elevated. She underwent a pharmacologic nuclear stress test which showed evidence of anterior wall ischemia. I proceeded with cardiac catheterization which showed tonic occluded mid LAD with right-to-left collaterals. There was high-grade stenosis in a large OM 2 extending to the apex. This was treated with angioplasty and drug-eluting stent placement. She reports improved symptoms since then. She continues to have mild dyspnea. The home nurse reported hallucinations with clonidine. The patient denies chest discomfort. She reports recent chills, muscle aches and stuffy nose over the last few days.  Allergies  Allergen Reactions  . Citalopram Hydrobromide     REACTION: nausea, dizzy, dry mouth  . Diphenhydramine Hcl     REACTION: unspecified  . Metoclopramide     Other reaction(s): Unknown  . Metoclopramide Hcl     REACTION: unspecified  . Paroxetine     REACTION: hallucinations  . Tape Other (See Comments) and Rash     Current Outpatient Prescriptions on File Prior to Visit  Medication Sig Dispense Refill  . albuterol (PROVENTIL HFA;VENTOLIN HFA) 108 (90 BASE) MCG/ACT inhaler Inhale 2 puffs into the lungs every 6 (six) hours as needed for wheezing or shortness of breath.  1 Inhaler  6  . ALPRAZolam (XANAX) 1 MG tablet Take 1/2 tab in am, 1/2 tab at lunch and 1 tab at bedtime as needed  60 tablet  4  . amLODipine  (NORVASC) 2.5 MG tablet Take 1 tablet (2.5 mg total) by mouth daily.  90 tablet  1  . aspirin 81 MG tablet Take 81 mg by mouth daily.        . busPIRone (BUSPAR) 10 MG tablet TAKE ONE TABLET TWICE DAILY  180 tablet  1  . Carbidopa-Levodopa ER (SINEMET CR) 25-100 MG tablet controlled release Take 1 tablet by mouth 3 (three) times daily.  270 tablet  1  . cloNIDine (CATAPRES) 0.1 MG tablet Take 0.1 mg by mouth 2 (two) times daily.       . clopidogrel (PLAVIX) 75 MG tablet Take 75 mg by mouth daily with breakfast.      . fluticasone (FLONASE) 50 MCG/ACT nasal spray Place 2 sprays into the nose daily.  16 g  6  . furosemide (LASIX) 20 MG tablet TAKE ONE (1) TABLET EACH DAY  90 tablet  1  . HYDROcodone-acetaminophen (NORCO/VICODIN) 5-325 MG per tablet Take 1 tablet by mouth 3 (three) times daily as needed.  30 tablet  0  . insulin glargine (LANTUS) 100 UNIT/ML injection Inject 27 Units into the skin at bedtime.       . insulin lispro (HUMALOG) 100 UNIT/ML injection Inject 5-17 Units into the skin 3 (three) times daily before meals.      Marland Kitchen levothyroxine (SYNTHROID, LEVOTHROID) 75 MCG tablet Take 1 tablet (75 mcg total) by mouth daily.  30 tablet  5  . lisinopril (PRINIVIL,ZESTRIL) 20 MG tablet Take 1 tablet (20 mg total) by mouth 2 (two) times daily.  180  tablet  1  . metoprolol succinate (TOPROL-XL) 100 MG 24 hr tablet Take 1 tablet (100 mg total) by mouth daily. Take with or immediately following a meal.  90 tablet  3  . Multiple Vitamins-Minerals (CENTRUM SILVER PO) Take 1 tablet by mouth daily.        Marland Kitchen omeprazole (PRILOSEC) 20 MG capsule Take 1 capsule (20 mg total) by mouth daily.  90 capsule  0  . ondansetron (ZOFRAN) 4 MG tablet Take 1 tablet (4 mg total) by mouth every 8 (eight) hours as needed.  90 tablet  11  . Pancrelipase, Lip-Prot-Amyl, 5000 UNITS CPEP Take 3 capsules (15,000 Units total) by mouth 3 (three) times daily.  270 capsule  11  . pravastatin (PRAVACHOL) 20 MG tablet Take 1 tablet  (20 mg total) by mouth daily.  90 tablet  1  . rOPINIRole (REQUIP) 0.25 MG tablet Take 0.25 mg by mouth at bedtime.      . Syringe/Needle, Disp, (B-D ECLIPSE SYRINGE) 30G X 1/2" 1 ML MISC as directed.  100 each  6  . triamcinolone cream (KENALOG) 0.1 % Apply topically 2 (two) times daily.  30 g  0   No current facility-administered medications on file prior to visit.     Past Medical History  Diagnosis Date  . Anxiety   . CHF (congestive heart failure)   . Depression   . Diabetes mellitus     Followed by Dr. Tedd Sias at [facility]  . GERD (gastroesophageal reflux disease)   . Hypertension   . Hypothyroidism   . Osteoporosis   . Allergy   . Hyperlipidemia   . RLS (restless legs syndrome)   . Pancreatic disease     pancreatic failure  . Urinary incontinence   . Pancreatitis     Pancreatic resection/ open wound- [facility] 11/04  . Pneumonia     Pneumonia/ Emphysema 05/05  . Diplopia     Diplopia- ? 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 History of pleural empyema   . Sepsis   . Frequent falls   . Arthritis     left knee, [facility]  . Empyema lung      Past Surgical History  Procedure Laterality Date  . Breast lumpectomy      Lumpectomy right breast  . 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 Abdominal hysterectomy      Hysteroscopy/ [facility] & C ([place]) 12/05  . Removal of pancreas  2004  . Kyphoplasty  2005  . Broken shoulder and orbital bone  2005  . Cholecystectomy  1973  . Thoracentesis    . Vaginal delivery      9  . Cardiac catheterization  08/29/2013    x1 stent @ [facility]     Family History  Problem Relation Age of Onset  . Heart disease Mother   . Heart attack Mother   . Hypertension Mother   . Heart attack Father      History   Social History  . Marital Status: Widowed    Spouse Name: N/A    Number of Children: 7  . Years of Education: 6   Occupational  History  . retired- Veterinary surgeon    Social History Main Topics  . Smoking status: Never Smoker   . Smokeless tobacco: Never Used  . Alcohol Use: No  . Drug Use: No  . Sexual Activity:  Not on file   Other Topics Concern  . 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 Has 7 children.            Has Kendal HymenBonnie, daughter, as health care POA   Not sure about DNR   Permission to speak to daughter-in-law, Georgian Congela Ritchie, regarding health care matters.     ROS A 10 point review of system was performed. It is negative other than that mentioned in the history of present illness.   PHYSICAL EXAM   BP 140/78  Pulse 60  Ht 4\' 11"  (1.499 m)  Wt 182 lb 4 oz (82.668 kg)  BMI 36.79 kg/m2 Constitutional: She is oriented to person, place, and time. She appears well-developed and well-nourished. No distress.  HENT: No nasal discharge.  Head: Normocephalic and atraumatic.  Eyes: Pupils are equal and round. No discharge.  Neck: Normal range of motion. Neck supple. No JVD present. No thyromegaly present.  Cardiovascular: Normal rate, regular rhythm, normal heart sounds. Exam reveals no gallop and no friction rub. No murmur heard.  Pulmonary/Chest: Effort normal and breath sounds normal. No stridor. No respiratory distress. She has no wheezes. She has no rales. She exhibits no tenderness.  Abdominal: Soft. Bowel sounds are normal. She exhibits no distension. There is no tenderness. There is no rebound and no guarding.  Musculoskeletal: Normal range of motion. She exhibits no edema and no tenderness.  Neurological: She is alert and oriented to person, place, and time. Coordination normal.  Skin: Skin is warm and dry. No rash noted. She is not diaphoretic. No erythema. No pallor.  Psychiatric: She has a normal mood and affect. Her behavior is normal. Judgment and thought content normal.  Right radial pulse is normal no  hematoma   EKG: Normal sinus rhythm with first degree AV block. Nonspecific T wave changes.   ASSESSMENT AND PLAN

## 2013-09-23 NOTE — Telephone Encounter (Signed)
Kendal HymenBonnie notified Rx ready for pickup

## 2013-09-23 NOTE — Telephone Encounter (Signed)
[name] HymenBonnie calling to check status of Vicodin.  States the pt ran out on Saturday.

## 2013-09-25 ENCOUNTER — Encounter: Payer: Self-pay | Admitting: Cardiovascular Disease

## 2013-09-25 NOTE — Assessment & Plan Note (Signed)
She is doing reasonably well after her recent angioplasty and drug-eluting stent placement to OM2. Recommend continuing medical therapy. Dual antiplatelet therapy for at least 12 months. The LAD is occluded but seems to be chronic with right-to-left collaterals.

## 2013-09-25 NOTE — Assessment & Plan Note (Signed)
Continue treatment with pravastatin. Recommend a target LDL of less than 70.

## 2013-09-25 NOTE — Assessment & Plan Note (Signed)
I suspect that her dyspnea is likely multifactorial due to chronic diastolic heart failure and lung disease. Left ventricular end-diastolic pressure during cardiac catheterization was normal. She is currently on small dose Lasix and appears to be euvolemic.

## 2013-09-25 NOTE — Assessment & Plan Note (Signed)
Blood pressure is reasonably controlled. However, she has been having hallucinations with clonidine. I stopped clonidine and increased amlodipine to 5 mg once daily.

## 2013-09-26 ENCOUNTER — Telehealth: Payer: Self-pay | Admitting: *Deleted

## 2013-09-26 NOTE — Telephone Encounter (Signed)
Faxed professional communication re patients clonidine and amlodipine 09/26/13

## 2013-09-30 ENCOUNTER — Encounter: Payer: Self-pay | Admitting: Surgery

## 2013-10-01 ENCOUNTER — Ambulatory Visit: Payer: Medicare Other | Admitting: Internal Medicine

## 2013-10-02 ENCOUNTER — Telehealth: Payer: Self-pay | Admitting: Internal Medicine

## 2013-10-02 NOTE — Telephone Encounter (Signed)
[name] calling for refill on pt Vicodin.  Asking for a call when ready for pickup.

## 2013-10-02 NOTE — Telephone Encounter (Signed)
Last prescription written by Dr. Darrick Huntsmanullo on 09/23/2013 for # 30. Please advise

## 2013-10-02 NOTE — Telephone Encounter (Signed)
Fine to refill, however please confirm with pt how often she is taking this medication.

## 2013-10-07 MED ORDER — HYDROCODONE-ACETAMINOPHEN 5-325 MG PO TABS: 1.0000 | ORAL_TABLET | Freq: Three times a day (TID) | ORAL | Status: DC | PRN

## 2013-10-07 NOTE — Telephone Encounter (Signed)
The patient daughter called wanting to know if the patient's prescription was ready . She did inform me as to how often the patient takes her medication . She stated she takes 1 in the morning and 1 at night and 1 during the day if needed.

## 2013-10-07 NOTE — Telephone Encounter (Signed)
Prescription printed and ready for pick up. [name] is aware

## 2013-10-20 ENCOUNTER — Encounter: Payer: Self-pay | Admitting: Surgery

## 2013-10-30 ENCOUNTER — Other Ambulatory Visit: Payer: Self-pay | Admitting: *Deleted

## 2013-10-30 MED ORDER — CLOPIDOGREL BISULFATE 75 MG PO TABS: 75.0000 mg | ORAL_TABLET | Freq: Every day | ORAL | Status: DC

## 2013-11-01 ENCOUNTER — Ambulatory Visit (INDEPENDENT_AMBULATORY_CARE_PROVIDER_SITE_OTHER): Payer: Medicare Other | Admitting: Internal Medicine

## 2013-11-01 ENCOUNTER — Encounter: Payer: Self-pay | Admitting: Internal Medicine

## 2013-11-01 VITALS — BP 144/78 | HR 62 | Temp 98.3°F | Wt 181.0 lb

## 2013-11-01 DIAGNOSIS — I5032 Chronic diastolic (congestive) heart failure: Secondary | ICD-10-CM

## 2013-11-01 DIAGNOSIS — I2510 Atherosclerotic heart disease of native coronary artery without angina pectoris: Secondary | ICD-10-CM

## 2013-11-01 DIAGNOSIS — IMO0002 Reserved for concepts with insufficient information to code with codable children: Secondary | ICD-10-CM

## 2013-11-01 DIAGNOSIS — M1710 Unilateral primary osteoarthritis, unspecified knee: Secondary | ICD-10-CM

## 2013-11-01 DIAGNOSIS — T8131XA Disruption of external operation (surgical) wound, not elsewhere classified, initial encounter: Secondary | ICD-10-CM

## 2013-11-01 DIAGNOSIS — I10 Essential (primary) hypertension: Secondary | ICD-10-CM

## 2013-11-01 DIAGNOSIS — E119 Type 2 diabetes mellitus without complications: Secondary | ICD-10-CM

## 2013-11-01 MED ORDER — HYDROCODONE-ACETAMINOPHEN 5-325 MG PO TABS: 1.0000 | ORAL_TABLET | Freq: Three times a day (TID) | ORAL | Status: DC | PRN

## 2013-11-01 NOTE — Assessment & Plan Note (Signed)
Chronic OA pain in knees, ankles and low back. Symptoms poorly controlled with prn hydrocodone. Will try increase dose to 1-2 tablets q8hr prn. She will also follow up with orthopedics as scheduled for next cortisone injection.

## 2013-11-01 NOTE — Assessment & Plan Note (Signed)
Symptomatically doing well. Continue current medications. 

## 2013-11-01 NOTE — Assessment & Plan Note (Signed)
Symptomatically doing well. Will continue current medications including statin, metoprolol, lisinopril, aspirin, and plavix.

## 2013-11-01 NOTE — Progress Notes (Signed)
Pre visit review using our clinic review tool, if applicable. No additional management support is needed unless otherwise documented below in the visit note. 

## 2013-11-01 NOTE — Assessment & Plan Note (Signed)
Chronic abdominal wound. Appears to be healing with visible granulation tissue today. Will continue follow up at [facility].

## 2013-11-01 NOTE — Assessment & Plan Note (Signed)
BP Readings from Last 3 Encounters:  11/01/13 144/78  09/23/13 140/78  09/12/13 120/78   BP generally well controlled on current medications. Will continue.

## 2013-11-01 NOTE — Progress Notes (Signed)
Subjective:    Patient ID: [name], female    DOB: 03/17/1929, 80 y.o.   MRN: [id number]  HPI 80YO female with DM, CAD, chronic abdominal wound presents for follow up.  DM - BG well controlled. Did not bring record today. No BG>200 or less than 70. Seen by Dr. Tedd SiasSolum recently.  Abdominal wound - HH nurse coming once per week to change dressing. Also followed at wound care. No fever, chills. Pt feels that wound has decreased in size.  Chronic arthritis pain - aching leg pain has been more intense over last several months, particularly with colder weather. [name] has been taking Hydrocodone with little-no improvement.  Otherwise, [name] is feeling well. Appetite is good. No recent issues with nausea, diarrhea.  Outpatient Encounter Prescriptions as of 11/01/2013  Medication Sig  . albuterol (PROVENTIL HFA;VENTOLIN HFA) 108 (90 BASE) MCG/ACT inhaler Inhale 2 puffs into the lungs every 6 (six) hours as needed for wheezing or shortness of breath.  . ALPRAZolam (XANAX) 1 MG tablet Take 1/2 tab in am, 1/2 tab at lunch and 1 tab at bedtime as needed  . amLODipine (NORVASC) 5 MG tablet Take 1 tablet (5 mg total) by mouth daily.  Marland Kitchen. aspirin 81 MG tablet Take 81 mg by mouth daily.    . busPIRone (BUSPAR) 10 MG tablet TAKE ONE TABLET TWICE DAILY  . Carbidopa-Levodopa ER (SINEMET CR) 25-100 MG tablet controlled release Take 1 tablet by mouth 3 (three) times daily.  . cloNIDine (CATAPRES) 0.1 MG tablet   . clopidogrel (PLAVIX) 75 MG tablet Take 1 tablet (75 mg total) by mouth daily with breakfast.  . fluticasone (FLONASE) 50 MCG/ACT nasal spray Place 2 sprays into the nose daily.  . furosemide (LASIX) 20 MG tablet TAKE ONE (1) TABLET EACH DAY  . HYDROcodone-acetaminophen (NORCO/VICODIN) 5-325 MG per tablet Take 1-2 tablets by mouth 3 (three) times daily as needed.  . insulin glargine (LANTUS) 100 UNIT/ML injection Inject 27 Units into the skin at bedtime.   . insulin lispro (HUMALOG) 100 UNIT/ML  injection Inject 5-17 Units into the skin 3 (three) times daily before meals.  Marland Kitchen. levothyroxine (SYNTHROID, LEVOTHROID) 75 MCG tablet Take 1 tablet (75 mcg total) by mouth daily.  Marland Kitchen. lisinopril (PRINIVIL,ZESTRIL) 20 MG tablet Take 1 tablet (20 mg total) by mouth 2 (two) times daily.  . metoprolol succinate (TOPROL-XL) 100 MG 24 hr tablet Take 1 tablet (100 mg total) by mouth daily. Take with or immediately following a meal.  . Multiple Vitamins-Minerals (CENTRUM SILVER PO) Take 1 tablet by mouth daily.    Marland Kitchen. omeprazole (PRILOSEC) 20 MG capsule Take 1 capsule (20 mg total) by mouth daily.  . ondansetron (ZOFRAN) 4 MG tablet Take 1 tablet (4 mg total) by mouth every 8 (eight) hours as needed.  . Pancrelipase, Lip-Prot-Amyl, 5000 UNITS CPEP Take 3 capsules (15,000 Units total) by mouth 3 (three) times daily.  . pravastatin (PRAVACHOL) 20 MG tablet Take 1 tablet (20 mg total) by mouth daily.  Marland Kitchen. rOPINIRole (REQUIP) 0.25 MG tablet Take 0.25 mg by mouth at bedtime.  . Syringe/Needle, Disp, (B-D ECLIPSE SYRINGE) 30G X 1/2" 1 ML MISC as directed.  . triamcinolone cream (KENALOG) 0.1 % Apply topically 2 (two) times daily.       Review of Systems  Constitutional: Negative for fever, chills, appetite change, fatigue and unexpected weight change.  HENT: Negative for congestion, ear pain, sinus pressure, sore throat, trouble swallowing and voice change.   Eyes: Negative for visual  disturbance.  Respiratory: Negative for cough, shortness of breath, wheezing and stridor.   Cardiovascular: Negative for chest pain, palpitations and leg swelling.  Gastrointestinal: Negative for nausea, vomiting, abdominal pain, diarrhea, constipation, blood in stool, abdominal distention and anal bleeding.  Genitourinary: Negative for dysuria and flank pain.  Musculoskeletal: Positive for arthralgias, back pain and myalgias. Negative for gait problem and neck pain.  Skin: Positive for wound. Negative for color change and rash.    Neurological: Negative for dizziness and headaches.  Hematological: Negative for adenopathy. Does not bruise/bleed easily.  Psychiatric/Behavioral: Negative for suicidal ideas, sleep disturbance and dysphoric mood. The patient is not nervous/anxious.        Objective:    BP 144/78  Pulse 62  Temp(Src) 98.3 F (36.8 C) (Oral)  Wt 181 lb (82.101 kg)  SpO2 98% Physical Exam  Constitutional: [name] is oriented to person, place, and time. [name] appears well-developed and well-nourished. No distress.  HENT:  Head: Normocephalic and atraumatic.  Right Ear: External ear normal.  Left Ear: External ear normal.  Nose: Nose normal.  Mouth/Throat: Oropharynx is clear and moist. No oropharyngeal exudate.  Eyes: Conjunctivae are normal. Pupils are equal, round, and reactive to light. Right eye exhibits no discharge. Left eye exhibits no discharge. No scleral icterus.  Neck: Normal range of motion. Neck supple. No tracheal deviation present. No thyromegaly present.  Cardiovascular: Normal rate, regular rhythm, normal heart sounds and intact distal pulses.  Exam reveals no gallop and no friction rub.   No murmur heard. Pulmonary/Chest: Effort normal and breath sounds normal. No accessory muscle usage. Not tachypneic. No respiratory distress. [name] has no decreased breath sounds. [name] has no wheezes. [name] has no rhonchi. [name] has no rales. [name] exhibits no tenderness.  Abdominal: Soft. Bowel sounds are normal. [name] exhibits no distension. There is no tenderness.    Musculoskeletal: Normal range of motion. [name] exhibits no edema and no tenderness.  Lymphadenopathy:    [name] has no cervical adenopathy.  Neurological: [name] is alert and oriented to person, place, and time. No cranial nerve deficit. [name] exhibits normal muscle tone. Coordination normal.  Skin: Skin is warm and dry. No rash noted. [name] is not diaphoretic. No erythema. No pallor.  Psychiatric: [name] has a normal mood and affect. Her behavior is normal.  Judgment and thought content normal.          Assessment & Plan:   Problem List Items Addressed This Visit   Coronary artery disease     Symptomatically doing well. Will continue current medications including statin, metoprolol, lisinopril, aspirin, and plavix.    Relevant Medications      cloNIDine (CATAPRES) 0.1 MG tablet   DIABETES MELLITUS, TYPE II      Lab Results  Component Value Date   HGBA1C 6.8* 09/12/2013   BG well controlled. Plan repeat A1c in late 11/2013. Continue current medications.    Relevant Orders      Comprehensive metabolic panel      Hemoglobin A1c      Lipid panel      Microalbumin / creatinine urine ratio      TSH   FAILURE, DIASTOLIC HEART, CHRONIC     Symptomatically doing well. Continue current medications.    HYPERTENSION      BP Readings from Last 3 Encounters:  11/01/13 144/78  09/23/13 140/78  09/12/13 120/78   BP generally well controlled on current medications. Will continue.    Osteoarthrosis, unspecified whether generalized or localized, involving lower leg -  Primary     Chronic OA pain in knees, ankles and low back. Symptoms poorly controlled with prn hydrocodone. Will try increase dose to 1-2 tablets q8hr prn. [name] will also follow up with orthopedics as scheduled for next cortisone injection.    Relevant Medications      HYDROcodone-acetaminophen (NORCO/VICODIN) 5-325 MG per tablet   WOUND DEHISCENCE, ABDOMINAL     Chronic abdominal wound. Appears to be healing with visible granulation tissue today. Will continue follow up at [facility].        Return in about 6 weeks (around 12/13/2013) for Recheck of Diabetes.

## 2013-11-01 NOTE — Assessment & Plan Note (Signed)
Lab Results  Component Value Date   HGBA1C 6.8* 09/12/2013   BG well controlled. Plan repeat A1c in late 11/2013. Continue current medications.

## 2013-11-06 ENCOUNTER — Telehealth: Payer: Self-pay | Admitting: *Deleted

## 2013-11-06 NOTE — Telephone Encounter (Signed)
Christy with [facility] called because she is out there with the patient now. Stated the patient woke up this morning and her eye was blood shot at that bottom, sort of like a vein had popped. Directed her to tell the patient she needs to be evaluated go to an urgent care today since there were no more openings on our schedule and it was so late in the day. Patient declined, offered her to come in to see NP tomorrow, patient declined and stated she would just give a couple more days.

## 2013-11-08 ENCOUNTER — Telehealth: Payer: Self-pay

## 2013-11-08 ENCOUNTER — Telehealth: Payer: Self-pay | Admitting: Internal Medicine

## 2013-11-08 MED ORDER — AMLODIPINE BESYLATE 5 MG PO TABS: 10.0000 mg | ORAL_TABLET | Freq: Every day | ORAL | Status: DC

## 2013-11-08 NOTE — Telephone Encounter (Signed)
Instructed patient and nurse Arline Asp(Cindy) to increase Amlodipine to 10 mg daily Continue to monitor blood pressure and let us know if it does not trend down  Educated patient on low fat diet to help control the weight gain  Instructed patient to call Monday if weight continued to trend up Instructed patient to go to the ED if her situation became emergent.

## 2013-11-08 NOTE — Telephone Encounter (Signed)
Fwd to Dr. Walker 

## 2013-11-08 NOTE — Telephone Encounter (Signed)
Yes, increase Amlodipine to 10 mg daily.

## 2013-11-08 NOTE — Telephone Encounter (Signed)
[name] from [facility] called back to see if we were going to give the patient appointment. She states that they had left a message asking if patient needed an appointment or not. I went to ask Dr. Dan HumphreysWalker if she wanted to see the patient. We looked at the patients chart and she was offered an appointment yesterday however patient didn't want to come in wanted to give it a day or two to heal. I offered the patient an appointment tomorrow at the Saturday clinic or if she wanted to still give it a day or two that dr Dan HumphreysWalker had an appointment on 11/13/13. She scheduled the appointment with Dr. Dan HumphreysWalker.

## 2013-11-08 NOTE — Telephone Encounter (Signed)
[facility] Nurse Arline Asp(Cindy) called to let us know that patients pressure has been elevated the past couple of days. Today it is 180/102.  Patient has been gaining weight the past 8 days, she has gone from 183-187. She denies increased sodium intake or dietary changes.  She denies shortness of breath and swelling.  She denies any other symptoms or chest pain.

## 2013-11-08 NOTE — Telephone Encounter (Signed)
Asking for appt , states pt has "bloody eye".  No appt available.  Transferred to triage.

## 2013-11-12 ENCOUNTER — Ambulatory Visit: Payer: Medicare Other | Admitting: Internal Medicine

## 2013-11-12 NOTE — Telephone Encounter (Signed)
Patient was scheduled to see Dr. Dan HumphreysWalker today however she called to cancel her appointment.

## 2013-11-20 ENCOUNTER — Encounter: Payer: Self-pay | Admitting: Surgery

## 2013-11-20 ENCOUNTER — Telehealth: Payer: Self-pay | Admitting: *Deleted

## 2013-11-20 MED ORDER — CLONIDINE HCL 0.1 MG PO TABS: 0.2000 mg | ORAL_TABLET | Freq: Two times a day (BID) | ORAL | Status: DC

## 2013-11-20 NOTE — Telephone Encounter (Signed)
Nurse from [facility] called to say patients blood pressure has trended back up.  3/27 160/98 3/30 168/90 04/01 178/94 Patient denies any symptoms   Information given to Hurman Hornony Arguello PA-C Per Alinda Moneyony increase Clonidine to 0.2 mg BID  Continue to monitor blood pressure  Monitor for decrease in heart rate  [facility] Nurse informed  [facility] nurse verbalized understanding   Left voicemail for patient to review medication change with her.

## 2013-11-21 NOTE — Telephone Encounter (Signed)
Spoke with patient  Instructed her to increase clonidine to 0.2 mg BID Instructed her to monitor for signs of low blood pressure and heart rate  Instructed her to call with any questions  Patient verbalized understanding

## 2013-11-27 ENCOUNTER — Telehealth: Payer: Self-pay | Admitting: *Deleted

## 2013-11-27 NOTE — Telephone Encounter (Signed)
Response for surgical clearance faxed to [facility] 11/27/13.

## 2013-11-28 ENCOUNTER — Telehealth: Payer: Self-pay | Admitting: *Deleted

## 2013-11-28 ENCOUNTER — Ambulatory Visit: Payer: Self-pay | Admitting: Ophthalmology

## 2013-11-28 LAB — POTASSIUM: POTASSIUM: 4 mmol/L (ref 3.5–5.1)

## 2013-11-28 NOTE — Telephone Encounter (Signed)
At least until January of 2016.

## 2013-11-28 NOTE — Telephone Encounter (Signed)
Informed patients daughter that per Dr. Kirke CorinArida patient would need to stay on Plavix until January 2016 Patients daughter verbalized understanding and to report any signs of abnormal bleeding

## 2013-11-28 NOTE — Telephone Encounter (Signed)
Patient's daughter called and want to know how long she is to be on the plavix?

## 2013-11-29 ENCOUNTER — Telehealth: Payer: Self-pay | Admitting: *Deleted

## 2013-11-29 NOTE — Telephone Encounter (Signed)
Christy nurse with [facility] called and the patient's weight is up by 5 lbs. And swelling in legs. Please call

## 2013-11-29 NOTE — Telephone Encounter (Signed)
[facility] nurse called and stated patient has gained 5 lbs in a short period of time Denies "anymore shortness of breath than usual"  She stated she is "feeling more tired than usual"  Her legs are swelling more than usual  Patients care giver stated she had a large amount of sodium this week  Instructed patient to follow low sodium diet and elevate legs  Attempted to contact Steward DroneBrenda (daughter in law at [phone]) who prepares all of the patients meals to discuss low sodium diet  Left voice mail

## 2013-12-02 ENCOUNTER — Telehealth: Payer: Self-pay

## 2013-12-02 DIAGNOSIS — I5032 Chronic diastolic (congestive) heart failure: Secondary | ICD-10-CM

## 2013-12-02 NOTE — Telephone Encounter (Signed)
Nurse with [facility] called states pt weight is still increasing. Current weight is 189.6, yesterday was 188, day before 187. Please call.

## 2013-12-02 NOTE — Telephone Encounter (Signed)
Nurse with [facility] called states pt weight is still increasing. Current weight is 189.6, yesterday was 188, day before 187. Please call.  

## 2013-12-03 MED ORDER — FUROSEMIDE 40 MG PO TABS: 40.0000 mg | ORAL_TABLET | Freq: Every day | ORAL | Status: DC

## 2013-12-03 NOTE — Telephone Encounter (Signed)
Increase lasix to 40 mg once daily. Check BMP in 5-7 days.

## 2013-12-03 NOTE — Telephone Encounter (Signed)
Informed home health nurse Neysa BonitoChristy.  "Increase lasix to 40 mg once daily. Check BMP in 5-7 days." She verbalized understanding and will draw labs and fax to us    LVM for patient making her aware of changes.

## 2013-12-04 ENCOUNTER — Other Ambulatory Visit: Payer: Self-pay | Admitting: Internal Medicine

## 2013-12-04 DIAGNOSIS — M1710 Unilateral primary osteoarthritis, unspecified knee: Secondary | ICD-10-CM

## 2013-12-04 DIAGNOSIS — IMO0002 Reserved for concepts with insufficient information to code with codable children: Secondary | ICD-10-CM

## 2013-12-04 MED ORDER — HYDROCODONE-ACETAMINOPHEN 5-325 MG PO TABS: 1.0000 | ORAL_TABLET | Freq: Three times a day (TID) | ORAL | Status: DC | PRN

## 2013-12-04 NOTE — Telephone Encounter (Signed)
Pt notified Rx ready for pickup. States daughter knows to pick it up

## 2013-12-04 NOTE — Telephone Encounter (Signed)
Instructed patient per Dr. Kirke CorinArida increase Lasix to 40 mg daily  Lab appt made Informed patient that if her home health nurse draws labs and sends them we can cancel her appt

## 2013-12-04 NOTE — Telephone Encounter (Signed)
Pt returning your call

## 2013-12-04 NOTE — Telephone Encounter (Signed)
HYDROcodone-acetaminophen (NORCO/VICODIN) 5-325 MG per  ° °

## 2013-12-04 NOTE — Telephone Encounter (Signed)
Last visit 11/01/13

## 2013-12-05 ENCOUNTER — Telehealth: Payer: Self-pay | Admitting: *Deleted

## 2013-12-05 ENCOUNTER — Other Ambulatory Visit: Payer: Self-pay | Admitting: Internal Medicine

## 2013-12-05 NOTE — Telephone Encounter (Signed)
Per fax from [facility] pt is not interested in rehab because she unable to do so

## 2013-12-05 NOTE — Telephone Encounter (Signed)
Electronic Rx request, please advise. 

## 2013-12-05 NOTE — Telephone Encounter (Signed)
Rx faxed to pharmacy  

## 2013-12-09 ENCOUNTER — Ambulatory Visit: Payer: Self-pay | Admitting: Ophthalmology

## 2013-12-10 ENCOUNTER — Telehealth: Payer: Self-pay | Admitting: *Deleted

## 2013-12-10 NOTE — Telephone Encounter (Signed)
Return faxed written order for BMP and lasix change to [facility].

## 2013-12-11 ENCOUNTER — Other Ambulatory Visit: Payer: Medicare Other

## 2013-12-16 ENCOUNTER — Telehealth: Payer: Self-pay | Admitting: Internal Medicine

## 2013-12-16 NOTE — Telephone Encounter (Signed)
OK. They can send us over an Rx for this and I will sign.

## 2013-12-16 NOTE — Telephone Encounter (Signed)
Angie with [facility] left vm.  Needs to verify if we received a request faxed Friday for Dr. Dan HumphreysWalker to review.

## 2013-12-16 NOTE — Telephone Encounter (Signed)
Patient will contact [employer] to have them send us the request for the back brace.

## 2013-12-16 NOTE — Telephone Encounter (Signed)
Fwd to Dr. Walker, please advise 

## 2013-12-16 NOTE — Telephone Encounter (Signed)
The patient called she is wanting a back brace from [employer].

## 2013-12-17 ENCOUNTER — Other Ambulatory Visit: Payer: Self-pay | Admitting: Internal Medicine

## 2013-12-17 NOTE — Telephone Encounter (Signed)
Called medical supply company and informed them patient has requested to use a different company. They will remove the order and not send anymore request.

## 2013-12-20 ENCOUNTER — Encounter: Payer: Self-pay | Admitting: Surgery

## 2013-12-23 ENCOUNTER — Ambulatory Visit (INDEPENDENT_AMBULATORY_CARE_PROVIDER_SITE_OTHER): Payer: Medicare Other | Admitting: Cardiovascular Disease

## 2013-12-23 ENCOUNTER — Other Ambulatory Visit: Payer: Self-pay | Admitting: *Deleted

## 2013-12-23 ENCOUNTER — Encounter: Payer: Self-pay | Admitting: Cardiovascular Disease

## 2013-12-23 VITALS — BP 122/78 | HR 64 | Ht 59.0 in | Wt 184.5 lb

## 2013-12-23 DIAGNOSIS — E785 Hyperlipidemia, unspecified: Secondary | ICD-10-CM

## 2013-12-23 DIAGNOSIS — I5032 Chronic diastolic (congestive) heart failure: Secondary | ICD-10-CM

## 2013-12-23 DIAGNOSIS — I2510 Atherosclerotic heart disease of native coronary artery without angina pectoris: Secondary | ICD-10-CM

## 2013-12-23 DIAGNOSIS — I10 Essential (primary) hypertension: Secondary | ICD-10-CM

## 2013-12-23 MED ORDER — FLUTICASONE PROPIONATE 50 MCG/ACT NA SUSP: 2.0000 | Freq: Every day | NASAL | Status: DC

## 2013-12-23 MED ORDER — FUROSEMIDE 40 MG PO TABS: 40.0000 mg | ORAL_TABLET | Freq: Every day | ORAL | Status: DC

## 2013-12-23 NOTE — Progress Notes (Signed)
Primary care physician: Dr. Ronna PolioJennifer Walker   HPI  This is an 80 year old female who is here today for a followup visit regarding coronary artery disease and chronic diastolic heart failure. She has known history of  DM, refractory HTN, pancreatic insufficiency, and osteoarthritis .  She was admitted on January 6 with worsening dyspnea. She was noted to have right lower lobe pneumonia. She was treated with antibiotics. BNP was mildly elevated. She underwent a pharmacologic nuclear stress test which showed evidence of anterior wall ischemia with normal ejection fraction. I proceeded with cardiac catheterization which showed tonic occluded mid LAD with right-to-left collaterals. There was high-grade stenosis in a large OM 2 extending to the apex. This was treated with angioplasty and drug-eluting stent placement.  She had recent problems with lower extremity edema and weight gain. This improved with Lasix. She was consuming large amount of sodium that she cut down significantly on this. She is feeling better overall. She denies any chest pain.    Allergies  Allergen Reactions  . Citalopram Hydrobromide     REACTION: nausea, dizzy, dry mouth  . Diphenhydramine Hcl     REACTION: unspecified  . Metoclopramide     Other reaction(s): Unknown  . Metoclopramide Hcl     REACTION: unspecified  . Paroxetine     REACTION: hallucinations  . Tape Other (See Comments) and Rash     Current Outpatient Prescriptions on File Prior to Visit  Medication Sig Dispense Refill  . albuterol (PROVENTIL HFA;VENTOLIN HFA) 108 (90 BASE) MCG/ACT inhaler Inhale 2 puffs into the lungs every 6 (six) hours as needed for wheezing or shortness of breath.  1 Inhaler  6  . ALPRAZolam (XANAX) 1 MG tablet TAKE 1/2 TABLET IN THE MORNING TAKE 1/2 TABLET AT LUNCH AND TAKE 1 TABLET AT BEDTIME IF NEEDED  60 tablet  3  . amLODipine (NORVASC) 5 MG tablet Take 2 tablets (10 mg total) by mouth daily.  30 tablet  6  . aspirin 81  MG tablet Take 81 mg by mouth daily.        . busPIRone (BUSPAR) 10 MG tablet TAKE ONE TABLET TWICE DAILY  180 tablet  1  . Carbidopa-Levodopa ER (SINEMET CR) 25-100 MG tablet controlled release TAKE ONE (1) TABLET BY MOUTH 3 TIMES DAILY  270 tablet  1  . cloNIDine (CATAPRES) 0.1 MG tablet Take 2 tablets (0.2 mg total) by mouth 2 (two) times daily.  60 tablet  3  . clopidogrel (PLAVIX) 75 MG tablet Take 1 tablet (75 mg total) by mouth daily with breakfast.  30 tablet  5  . HYDROcodone-acetaminophen (NORCO/VICODIN) 5-325 MG per tablet Take 1-2 tablets by mouth 3 (three) times daily as needed.  120 tablet  0  . insulin glargine (LANTUS) 100 UNIT/ML injection Inject 27 Units into the skin at bedtime.       . insulin lispro (HUMALOG) 100 UNIT/ML injection Inject 5-17 Units into the skin 3 (three) times daily before meals.      Marland Kitchen. levothyroxine (SYNTHROID, LEVOTHROID) 75 MCG tablet Take 1 tablet (75 mcg total) by mouth daily.  30 tablet  5  . lisinopril (PRINIVIL,ZESTRIL) 20 MG tablet Take 1 tablet (20 mg total) by mouth 2 (two) times daily.  180 tablet  1  . metoprolol succinate (TOPROL-XL) 100 MG 24 hr tablet Take 1 tablet (100 mg total) by mouth daily. Take with or immediately following a meal.  90 tablet  3  . Multiple Vitamins-Minerals (CENTRUM SILVER  PO) Take 1 tablet by mouth daily.        Marland Kitchen omeprazole (PRILOSEC) 20 MG capsule Take 1 capsule (20 mg total) by mouth daily.  90 capsule  0  . ondansetron (ZOFRAN) 4 MG tablet Take 1 tablet (4 mg total) by mouth every 8 (eight) hours as needed.  90 tablet  11  . Pancrelipase, Lip-Prot-Amyl, 5000 UNITS CPEP Take 3 capsules (15,000 Units total) by mouth 3 (three) times daily.  270 capsule  11  . pravastatin (PRAVACHOL) 20 MG tablet Take 1 tablet (20 mg total) by mouth daily.  90 tablet  1  . rOPINIRole (REQUIP) 0.25 MG tablet Take 0.25 mg by mouth at bedtime.      . Syringe/Needle, Disp, (B-D ECLIPSE SYRINGE) 30G X 1/2" 1 ML MISC as directed.  100 each  6    . triamcinolone cream (KENALOG) 0.1 % Apply topically 2 (two) times daily.  30 g  0   No current facility-administered medications on file prior to visit.     Past Medical History  Diagnosis Date  . Anxiety   . CHF (congestive heart failure)   . Depression   . Diabetes mellitus     Followed by Dr. Tedd Sias at [facility]  . GERD (gastroesophageal reflux disease)   . Hypertension   . Hypothyroidism   . Osteoporosis   . Allergy   . Hyperlipidemia   . RLS (restless legs syndrome)   . Pancreatic disease     pancreatic failure  . Urinary incontinence   . Pancreatitis     Pancreatic resection/ open wound- [facility] 11/04  . Pneumonia     Pneumonia/ Emphysema 05/05  . Diplopia     Diplopia- ? 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 History of pleural empyema   . Sepsis   . Frequent falls   . Arthritis     left knee, [facility]  . Empyema lung      Past Surgical History  Procedure Laterality Date  . Breast lumpectomy      Lumpectomy right breast  . 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 Abdominal hysterectomy      Hysteroscopy/ D & C ([place]) 12/05  . Removal of pancreas  2004  . Kyphoplasty  2005  . Broken shoulder and orbital bone  2005  . Cholecystectomy  1973  . Thoracentesis    . Vaginal delivery      9  . Cardiac catheterization  08/29/2013    x1 stent @ [facility]  . Cataract surgery       Family History  Problem Relation Age of Onset  . Heart disease Mother   . Heart attack Mother   . Hypertension Mother   . Heart attack Father      History   Social History  . Marital Status: Widowed    Spouse Name: N/A    Number of Children: 7  . Years of Education: 6   Occupational History  . retired- Veterinary surgeon    Social History Main Topics  . Smoking status: Never Smoker   . Smokeless tobacco: Never Used  . Alcohol Use: No  . Drug Use: No  . Sexual Activity: Not on  file   Other Topics Concern  . Not on file   Social History Narrative   Lives in home alone, has nursing aid, and alert device in [employer]. Has  7 children.            Has Kendal HymenBonnie, daughter, as health care POA   Not sure about DNR   Permission to speak to daughter-in-law, Georgian Congela Knopf, regarding health care matters.     ROS A 10 point review of system was performed. It is negative other than that mentioned in the history of present illness.   PHYSICAL EXAM   BP 122/78  Pulse 64  Ht 4\' 11"  (1.499 m)  Wt 184 lb 8 oz (83.689 kg)  BMI 37.24 kg/m2 Constitutional: She is oriented to person, place, and time. She appears well-developed and well-nourished. No distress.  HENT: No nasal discharge.  Head: Normocephalic and atraumatic.  Eyes: Pupils are equal and round. No discharge.  Neck: Normal range of motion. Neck supple. No JVD present. No thyromegaly present.  Cardiovascular: Normal rate, regular rhythm, normal heart sounds. Exam reveals no gallop and no friction rub. No murmur heard.  Pulmonary/Chest: Effort normal and breath sounds normal. No stridor. No respiratory distress. She has no wheezes. She has no rales. She exhibits no tenderness.  Abdominal: Soft. Bowel sounds are normal. She exhibits no distension. There is no tenderness. There is no rebound and no guarding.  Musculoskeletal: Normal range of motion. She exhibits no edema and no tenderness.  Neurological: She is alert and oriented to person, place, and time. Coordination normal.  Skin: Skin is warm and dry. No rash noted. She is not diaphoretic. No erythema. No pallor.  Psychiatric: She has a normal mood and affect. Her behavior is normal. Judgment and thought content normal.      ASSESSMENT AND PLAN

## 2013-12-23 NOTE — Patient Instructions (Signed)
Your physician has recommended you make the following change in your medication:  Decrease Lasix to 40 mg daily    Your physician recommends that you schedule a follow-up appointment in:  3 months

## 2013-12-24 ENCOUNTER — Telehealth: Payer: Self-pay

## 2013-12-24 NOTE — Assessment & Plan Note (Signed)
She had problems with fluid overload recently which improved after increasing the dose of Lasix. She is supposed to be on 40 mg once daily. She thinks she might have been taken 80 mg once daily but I instructed him not to take more than 40 mg to minimize the risk of kidney injury.

## 2013-12-24 NOTE — Assessment & Plan Note (Signed)
She is doing well overall with no symptoms suggestive of angina. Continue medical therapy. Continue dual antiplatelet therapy with aspirin and Plavix for at least 12 months.

## 2013-12-24 NOTE — Telephone Encounter (Signed)
Pt nurse aide, Lytle MichaelsKathleeen called and states pt has question regarding her Lasix. Please call.

## 2013-12-24 NOTE — Assessment & Plan Note (Signed)
Blood pressure is well controlled on current medications. 

## 2013-12-24 NOTE — Telephone Encounter (Signed)
Informed Nicholos JohnsKathleen that patient needs to be on 40 mg lasix daily  She verbalized understanding

## 2013-12-24 NOTE — Assessment & Plan Note (Signed)
Lab Results  Component Value Date   CHOL 162 10/11/2012   HDL 41.00 10/11/2012   LDLCALC 86 02/18/2011   LDLDIRECT 80.7 10/11/2012   TRIG 259.0* 10/11/2012   CHOLHDL 4 10/11/2012   Continue treatment with pravastatin.

## 2013-12-26 ENCOUNTER — Encounter: Payer: Self-pay | Admitting: Internal Medicine

## 2013-12-26 ENCOUNTER — Ambulatory Visit (INDEPENDENT_AMBULATORY_CARE_PROVIDER_SITE_OTHER): Payer: Medicare Other | Admitting: Internal Medicine

## 2013-12-26 VITALS — BP 126/68 | HR 60 | Resp 16 | Wt 184.5 lb

## 2013-12-26 DIAGNOSIS — IMO0002 Reserved for concepts with insufficient information to code with codable children: Secondary | ICD-10-CM

## 2013-12-26 DIAGNOSIS — E119 Type 2 diabetes mellitus without complications: Secondary | ICD-10-CM

## 2013-12-26 DIAGNOSIS — Z780 Asymptomatic menopausal state: Secondary | ICD-10-CM

## 2013-12-26 DIAGNOSIS — M1710 Unilateral primary osteoarthritis, unspecified knee: Secondary | ICD-10-CM

## 2013-12-26 DIAGNOSIS — I2510 Atherosclerotic heart disease of native coronary artery without angina pectoris: Secondary | ICD-10-CM

## 2013-12-26 DIAGNOSIS — T8131XA Disruption of external operation (surgical) wound, not elsewhere classified, initial encounter: Secondary | ICD-10-CM

## 2013-12-26 DIAGNOSIS — I5032 Chronic diastolic (congestive) heart failure: Secondary | ICD-10-CM

## 2013-12-26 LAB — COMPREHENSIVE METABOLIC PANEL
ALBUMIN: 3.7 g/dL (ref 3.5–5.2)
ALT: 8 U/L (ref 0–35)
AST: 20 U/L (ref 0–37)
Alkaline Phosphatase: 84 U/L (ref 39–117)
BUN: 22 mg/dL (ref 6–23)
CALCIUM: 9.2 mg/dL (ref 8.4–10.5)
CHLORIDE: 104 meq/L (ref 96–112)
CO2: 33 meq/L — AB (ref 19–32)
CREATININE: 1.1 mg/dL (ref 0.4–1.2)
GFR: 49.45 mL/min — AB (ref >60.00)
GLUCOSE: 83 mg/dL (ref 70–99)
POTASSIUM: 4 meq/L (ref 3.5–5.1)
Sodium: 144 mEq/L (ref 135–145)
Total Bilirubin: 0.9 mg/dL (ref 0.2–1.2)
Total Protein: 6.5 g/dL (ref 6.0–8.3)

## 2013-12-26 LAB — HEMOGLOBIN A1C: Hgb A1c MFr Bld: 7.4 % — ABNORMAL HIGH (ref 4.6–6.5)

## 2013-12-26 LAB — LIPID PANEL
CHOLESTEROL: 165 mg/dL (ref 0–200)
HDL: 40.3 mg/dL (ref >39.00)
LDL CALC: 60 mg/dL (ref 0–99)
TRIGLYCERIDES: 323 mg/dL — AB (ref 0.0–149.0)
Total CHOL/HDL Ratio: 4
VLDL: 64.6 mg/dL — ABNORMAL HIGH (ref 0.0–40.0)

## 2013-12-26 LAB — TSH: TSH: 0.33 u[IU]/mL — ABNORMAL LOW (ref 0.35–4.50)

## 2013-12-26 MED ORDER — HYDROCODONE-ACETAMINOPHEN 5-325 MG PO TABS: 1.0000 | ORAL_TABLET | Freq: Three times a day (TID) | ORAL | Status: DC | PRN

## 2013-12-26 NOTE — Assessment & Plan Note (Signed)
Symptomatically doing well. Reviewed recent cardiology notes. Discussed importance of continuing Plavix at least 12 months. Follow up 3 months and prn.

## 2013-12-26 NOTE — Assessment & Plan Note (Signed)
Symptomatically, doing well. Appears euvolemic today. Continue Furosemide 40mg  daily.

## 2013-12-26 NOTE — Assessment & Plan Note (Signed)
Wound relatively stable compared to previous. [facility].

## 2013-12-26 NOTE — Assessment & Plan Note (Signed)
Chronic knee pain 2/2 OA. Symptoms improved with prn hydrocodone. Continue Hydrocodone prn for severe pain. Continue to follow with orthopedics.

## 2013-12-26 NOTE — Progress Notes (Signed)
Pre visit review using our clinic review tool, if applicable. No additional management support is needed unless otherwise documented below in the visit note. 

## 2013-12-26 NOTE — Progress Notes (Signed)
Subjective:    Patient ID: [name], female    DOB: 08/26/1928, 80 y.o.   MRN: [id number]  HPI 80YO female presents for follow up. Not feeling well today. Woke at 4am feeling nervous. No chest pain.Mild shortness of breath with exertion. Feels congested. Seems worse outdoors.  DM - BG generally near 150, occasionally over 200.  Wound - recently, was increasing in size, so started putting "brown salve" back on it. Now improving. No fever, chills.  Review of Systems  Constitutional: Positive for fatigue. Negative for fever, chills, appetite change and unexpected weight change.  HENT: Negative for congestion, ear pain, sinus pressure, sore throat, trouble swallowing and voice change.   Eyes: Negative for visual disturbance.  Respiratory: Negative for cough, shortness of breath, wheezing and stridor.   Cardiovascular: Negative for chest pain, palpitations and leg swelling.  Gastrointestinal: Negative for nausea, vomiting, abdominal pain, diarrhea, constipation, blood in stool, abdominal distention and anal bleeding.  Genitourinary: Negative for dysuria and flank pain.  Musculoskeletal: Negative for arthralgias, gait problem, myalgias and neck pain.  Skin: Positive for color change and wound. Negative for rash.  Neurological: Negative for dizziness and headaches.  Hematological: Negative for adenopathy. Does not bruise/bleed easily.  Psychiatric/Behavioral: Negative for suicidal ideas, sleep disturbance and dysphoric mood. The patient is not nervous/anxious.        Objective:    BP 126/68  Pulse 60  Resp 16  Wt 184 lb 8 oz (83.689 kg)  SpO2 98% Physical Exam  Constitutional: She is oriented to person, place, and time. She appears well-developed and well-nourished. No distress.  HENT:  Head: Normocephalic and atraumatic.  Right Ear: External ear normal.  Left Ear: External ear normal.  Nose: Nose normal.  Mouth/Throat: Oropharynx is clear and moist. No oropharyngeal  exudate.  Eyes: Conjunctivae are normal. Pupils are equal, round, and reactive to light. Right eye exhibits no discharge. Left eye exhibits no discharge. No scleral icterus.  Neck: Normal range of motion. Neck supple. No tracheal deviation present. No thyromegaly present.  Cardiovascular: Normal rate, regular rhythm, normal heart sounds and intact distal pulses.  Exam reveals no gallop and no friction rub.   No murmur heard. Pulmonary/Chest: Effort normal and breath sounds normal. No accessory muscle usage. Not tachypneic. No respiratory distress. She has no decreased breath sounds. She has no wheezes. She has no rhonchi. She has no rales. She exhibits no tenderness.  Musculoskeletal: Normal range of motion. She exhibits no edema and no tenderness.  Lymphadenopathy:    She has no cervical adenopathy.  Neurological: She is alert and oriented to person, place, and time. No cranial nerve deficit. She exhibits normal muscle tone. Coordination normal.  Skin: Skin is warm and dry. No rash noted. She is not diaphoretic. There is erythema. No pallor.     Psychiatric: She has a normal mood and affect. Her behavior is normal. Judgment and thought content normal.          Assessment & Plan:   Problem List Items Addressed This Visit   Coronary artery disease     Symptomatically doing well. Reviewed recent cardiology notes. Discussed importance of continuing Plavix at least 12 months. Follow up 3 months and prn.    DIABETES MELLITUS, TYPE II - Primary     Will check A1c with labs today. Continue current medications.    FAILURE, DIASTOLIC HEART, CHRONIC     Symptomatically, doing well. Appears euvolemic today. Continue Furosemide 40mg  daily.    Osteoarthrosis,  unspecified whether generalized or localized, involving lower leg     Chronic knee pain 2/2 OA. Symptoms improved with prn hydrocodone. Continue Hydrocodone prn for severe pain. Continue to follow with orthopedics.    Relevant Medications        HYDROcodone-acetaminophen (NORCO/VICODIN) 5-325 MG per tablet   WOUND DEHISCENCE, ABDOMINAL     Wound relatively stable compared to previous. [facility].     Other Visit Diagnoses   Postmenopausal estrogen deficiency        Relevant Orders       DG Bone Density        Return in about 3 months (around 03/28/2014) for Recheck of Diabetes.

## 2013-12-26 NOTE — Assessment & Plan Note (Signed)
Will check A1c with labs today. Continue current medications. 

## 2013-12-27 ENCOUNTER — Telehealth: Payer: Self-pay | Admitting: *Deleted

## 2013-12-27 NOTE — Telephone Encounter (Signed)
Neysa Bonitohristy, RN w/ [facility] called back stating that pt saw Dr. Dan HumphreysWalker yesterday who drew labs and advised that pt appeared dehydrated. Pt was advised at that time to cut lasix in 1/2 to 20mg  and [facility].

## 2013-12-27 NOTE — Telephone Encounter (Signed)
Patients Home Health Nurse called and said: Patient weight is decreasing 1 lb- 2 lb daily  Seems dehydrated  Poor skin turgor  Short of breath when she was bathing which is abnormal for her  She has been taking the 40 mg of lasix as ordered

## 2013-12-27 NOTE — Telephone Encounter (Signed)
Ok

## 2013-12-30 ENCOUNTER — Encounter: Payer: Self-pay | Admitting: Surgery

## 2014-01-01 NOTE — Telephone Encounter (Signed)
Christy, [facility] called stating that pt has had 5 lb wt gain since Fri and is SOB since cutting lasix dose in 1/2. She drew blood this am for BMET., as pt would not let her in when she visited on Monday. She reports that pt's BP is 142/20, very audible and repeated to get the same number. Please advise.  Thank you.

## 2014-01-01 NOTE — Telephone Encounter (Signed)
Increase Lasix to 40mg once daily.

## 2014-01-02 NOTE — Telephone Encounter (Signed)
Informed patient of Dr. Sheilah PigeonAridas instruction  Patient verbalized understanding   LVM for patients home health nurse

## 2014-01-04 ENCOUNTER — Emergency Department: Payer: Self-pay | Admitting: Emergency Medicine

## 2014-01-04 LAB — COMPREHENSIVE METABOLIC PANEL
Albumin: 3.2 g/dL — ABNORMAL LOW (ref 3.4–5.0)
Alkaline Phosphatase: 100 U/L
Anion Gap: 4 — ABNORMAL LOW (ref 7–16)
BUN: 15 mg/dL (ref 7–18)
Bilirubin,Total: 0.4 mg/dL (ref 0.2–1.0)
CREATININE: 1.04 mg/dL (ref 0.60–1.30)
Calcium, Total: 8.7 mg/dL (ref 8.5–10.1)
Chloride: 103 mmol/L (ref 98–107)
Co2: 34 mmol/L — ABNORMAL HIGH (ref 21–32)
EGFR (African American): 58 — ABNORMAL LOW
EGFR (Non-African Amer.): 50 — ABNORMAL LOW
Glucose: 205 mg/dL — ABNORMAL HIGH (ref 65–99)
Osmolality: 288 (ref 275–301)
POTASSIUM: 4.3 mmol/L (ref 3.5–5.1)
SGOT(AST): 15 U/L (ref 15–37)
SGPT (ALT): 6 U/L — ABNORMAL LOW (ref 12–78)
Sodium: 141 mmol/L (ref 136–145)
TOTAL PROTEIN: 7 g/dL (ref 6.4–8.2)

## 2014-01-04 LAB — CBC
HCT: 39.6 % (ref 35.0–47.0)
HGB: 12.7 g/dL (ref 12.0–16.0)
MCH: 29.3 pg (ref 26.0–34.0)
MCHC: 32.1 g/dL (ref 32.0–36.0)
MCV: 91 fL (ref 80–100)
Platelet: 104 10*3/uL — ABNORMAL LOW (ref 150–440)
RBC: 4.34 10*6/uL (ref 3.80–5.20)
RDW: 13.8 % (ref 11.5–14.5)
WBC: 5.4 10*3/uL (ref 3.6–11.0)

## 2014-01-04 LAB — URINALYSIS, COMPLETE
Bilirubin,UR: NEGATIVE
GLUCOSE, UR: NEGATIVE mg/dL (ref 0–75)
Hyaline Cast: 10
NITRITE: NEGATIVE
PROTEIN: NEGATIVE
Ph: 5 (ref 4.5–8.0)
RBC,UR: 1 /HPF (ref 0–5)
Specific Gravity: 1.013 (ref 1.003–1.030)
Squamous Epithelial: NONE SEEN
WBC UR: 15 /HPF (ref 0–5)

## 2014-01-04 LAB — CK: CK, TOTAL: 25 U/L — AB

## 2014-01-04 LAB — PROTIME-INR
INR: 1.1
Prothrombin Time: 13.6 secs (ref 11.5–14.7)

## 2014-01-06 LAB — URINE CULTURE

## 2014-01-08 ENCOUNTER — Telehealth: Payer: Self-pay | Admitting: *Deleted

## 2014-01-08 NOTE — Telephone Encounter (Signed)
Home health nurse, Neysa BonitoChristy, called stating that pt seems dehydrated, weak, sugar is up, nurse seems to think lasix is factor and wants to know if ok to draw labs

## 2014-01-08 NOTE — Telephone Encounter (Signed)
Fine to draw CMP

## 2014-01-12 ENCOUNTER — Emergency Department: Payer: Self-pay | Admitting: Emergency Medicine

## 2014-01-12 LAB — CBC
HCT: 41.4 % (ref 35.0–47.0)
HGB: 13.3 g/dL (ref 12.0–16.0)
MCH: 29.2 pg (ref 26.0–34.0)
MCHC: 32.1 g/dL (ref 32.0–36.0)
MCV: 91 fL (ref 80–100)
PLATELETS: 104 10*3/uL — AB (ref 150–440)
RBC: 4.56 10*6/uL (ref 3.80–5.20)
RDW: 13.9 % (ref 11.5–14.5)
WBC: 10 10*3/uL (ref 3.6–11.0)

## 2014-01-12 LAB — CBC WITH DIFFERENTIAL/PLATELET
Basophil #: 0 10*3/uL (ref 0.0–0.1)
Basophil %: 0.2 %
Eosinophil #: 0.2 10*3/uL (ref 0.0–0.7)
Eosinophil %: 1.9 %
HCT: 41.5 % (ref 35.0–47.0)
HGB: 13.3 g/dL (ref 12.0–16.0)
Lymphocyte #: 0.6 10*3/uL — ABNORMAL LOW (ref 1.0–3.6)
Lymphocyte %: 4.9 %
MCH: 29.4 pg (ref 26.0–34.0)
MCHC: 32.1 g/dL (ref 32.0–36.0)
MCV: 91 fL (ref 80–100)
Monocyte #: 0.8 x10 3/mm (ref 0.2–0.9)
Monocyte %: 6.1 %
NEUTROS ABS: 11.1 10*3/uL — AB (ref 1.4–6.5)
Neutrophil %: 86.9 %
Platelet: 91 10*3/uL — ABNORMAL LOW (ref 150–440)
RBC: 4.54 10*6/uL (ref 3.80–5.20)
RDW: 14.1 % (ref 11.5–14.5)
WBC: 12.8 10*3/uL — ABNORMAL HIGH (ref 3.6–11.0)

## 2014-01-12 LAB — COMPREHENSIVE METABOLIC PANEL
ALBUMIN: 3.2 g/dL — AB (ref 3.4–5.0)
ALK PHOS: 96 U/L
ALK PHOS: 94 U/L
ANION GAP: 3 — AB (ref 7–16)
AST: 36 U/L (ref 15–37)
Albumin: 3.3 g/dL — ABNORMAL LOW (ref 3.4–5.0)
Anion Gap: 3 — ABNORMAL LOW (ref 7–16)
BUN: 26 mg/dL — AB (ref 7–18)
BUN: 28 mg/dL — ABNORMAL HIGH (ref 7–18)
Bilirubin,Total: 0.5 mg/dL (ref 0.2–1.0)
Bilirubin,Total: 0.7 mg/dL (ref 0.2–1.0)
CALCIUM: 9 mg/dL (ref 8.5–10.1)
CHLORIDE: 104 mmol/L (ref 98–107)
CHLORIDE: 102 mmol/L (ref 98–107)
CREATININE: 1.25 mg/dL (ref 0.60–1.30)
Calcium, Total: 8.9 mg/dL (ref 8.5–10.1)
Co2: 31 mmol/L (ref 21–32)
Co2: 31 mmol/L (ref 21–32)
Creatinine: 1.32 mg/dL — ABNORMAL HIGH (ref 0.60–1.30)
EGFR (African American): 46 — ABNORMAL LOW
EGFR (African American): 43 — ABNORMAL LOW
EGFR (Non-African Amer.): 40 — ABNORMAL LOW
EGFR (Non-African Amer.): 37 — ABNORMAL LOW
Glucose: 187 mg/dL — ABNORMAL HIGH (ref 65–99)
Glucose: 241 mg/dL — ABNORMAL HIGH (ref 65–99)
OSMOLALITY: 285 (ref 275–301)
Osmolality: 285 (ref 275–301)
Potassium: 4.6 mmol/L (ref 3.5–5.1)
Potassium: 5.2 mmol/L — ABNORMAL HIGH (ref 3.5–5.1)
SGOT(AST): 23 U/L (ref 15–37)
SGPT (ALT): 10 U/L — ABNORMAL LOW (ref 12–78)
SGPT (ALT): 6 U/L — ABNORMAL LOW (ref 12–78)
Sodium: 138 mmol/L (ref 136–145)
Sodium: 136 mmol/L (ref 136–145)
TOTAL PROTEIN: 7.1 g/dL (ref 6.4–8.2)
Total Protein: 7.5 g/dL (ref 6.4–8.2)

## 2014-01-12 LAB — URINALYSIS, COMPLETE
Bacteria: NONE SEEN
Bilirubin,UR: NEGATIVE
Blood: NEGATIVE
Glucose,UR: NEGATIVE mg/dL (ref 0–75)
Leukocyte Esterase: NEGATIVE
Nitrite: NEGATIVE
PH: 5 (ref 4.5–8.0)
PROTEIN: NEGATIVE
RBC,UR: 1 /HPF (ref 0–5)
SPECIFIC GRAVITY: 1.017 (ref 1.003–1.030)
Squamous Epithelial: NONE SEEN

## 2014-01-12 LAB — TROPONIN I: Troponin-I: <0.02 ng/mL

## 2014-01-12 LAB — SEDIMENTATION RATE: Erythrocyte Sed Rate: 24 mm/hr (ref 0–30)

## 2014-01-13 ENCOUNTER — Observation Stay: Payer: Self-pay | Admitting: Internal Medicine

## 2014-01-14 ENCOUNTER — Telehealth: Payer: Self-pay

## 2014-01-14 LAB — BASIC METABOLIC PANEL
Anion Gap: 5 — ABNORMAL LOW (ref 7–16)
BUN: 21 mg/dL — ABNORMAL HIGH (ref 7–18)
CREATININE: 1.2 mg/dL (ref 0.60–1.30)
Calcium, Total: 8.7 mg/dL (ref 8.5–10.1)
Chloride: 106 mmol/L (ref 98–107)
Co2: 29 mmol/L (ref 21–32)
EGFR (African American): 49 — ABNORMAL LOW
EGFR (Non-African Amer.): 42 — ABNORMAL LOW
Glucose: 207 mg/dL — ABNORMAL HIGH (ref 65–99)
OSMOLALITY: 288 (ref 275–301)
Potassium: 4.1 mmol/L (ref 3.5–5.1)
Sodium: 140 mmol/L (ref 136–145)

## 2014-01-14 NOTE — Telephone Encounter (Signed)
Relevant patient education assigned to patient using Emmi. ° °

## 2014-01-20 ENCOUNTER — Encounter: Payer: Self-pay | Admitting: Surgery

## 2014-01-21 ENCOUNTER — Encounter: Payer: Self-pay | Admitting: Internal Medicine

## 2014-01-21 ENCOUNTER — Ambulatory Visit (INDEPENDENT_AMBULATORY_CARE_PROVIDER_SITE_OTHER): Payer: Medicare Other | Admitting: Internal Medicine

## 2014-01-21 VITALS — BP 142/74 | HR 66 | Temp 97.9°F | Ht 58.9 in | Wt 180.5 lb

## 2014-01-21 DIAGNOSIS — IMO0002 Reserved for concepts with insufficient information to code with codable children: Secondary | ICD-10-CM

## 2014-01-21 DIAGNOSIS — I2510 Atherosclerotic heart disease of native coronary artery without angina pectoris: Secondary | ICD-10-CM

## 2014-01-21 DIAGNOSIS — E119 Type 2 diabetes mellitus without complications: Secondary | ICD-10-CM

## 2014-01-21 DIAGNOSIS — R7989 Other specified abnormal findings of blood chemistry: Secondary | ICD-10-CM

## 2014-01-21 DIAGNOSIS — R3989 Other symptoms and signs involving the genitourinary system: Secondary | ICD-10-CM

## 2014-01-21 DIAGNOSIS — M1710 Unilateral primary osteoarthritis, unspecified knee: Secondary | ICD-10-CM

## 2014-01-21 DIAGNOSIS — N390 Urinary tract infection, site not specified: Secondary | ICD-10-CM

## 2014-01-21 LAB — POCT URINALYSIS DIPSTICK
BILIRUBIN UA: NEGATIVE
Blood, UA: NEGATIVE
Glucose, UA: NEGATIVE
LEUKOCYTES UA: NEGATIVE
Nitrite, UA: NEGATIVE
PH UA: 5.5
Spec Grav, UA: 1.025
Urobilinogen, UA: 0.2

## 2014-01-21 LAB — CBC WITH DIFFERENTIAL/PLATELET
Basophils Absolute: 0 10*3/uL (ref 0.0–0.1)
Basophils Relative: 0.3 % (ref 0.0–3.0)
EOS PCT: 8.8 % — AB (ref 0.0–5.0)
Eosinophils Absolute: 0.6 10*3/uL (ref 0.0–0.7)
HEMATOCRIT: 38.7 % (ref 36.0–46.0)
Hemoglobin: 12.6 g/dL (ref 12.0–15.0)
LYMPHS ABS: 1.7 10*3/uL (ref 0.7–4.0)
Lymphocytes Relative: 24.1 % (ref 12.0–46.0)
MCHC: 32.6 g/dL (ref 30.0–36.0)
MCV: 89.9 fl (ref 78.0–100.0)
Monocytes Absolute: 0.5 10*3/uL (ref 0.1–1.0)
Monocytes Relative: 6.7 % (ref 3.0–12.0)
Neutro Abs: 4.2 10*3/uL (ref 1.4–7.7)
Neutrophils Relative %: 60.1 % (ref 43.0–77.0)
Platelets: 172 10*3/uL (ref 150.0–400.0)
RBC: 4.3 Mil/uL (ref 3.87–5.11)
RDW: 14.3 % (ref 11.5–15.5)
WBC: 7 10*3/uL (ref 4.0–10.5)

## 2014-01-21 LAB — COMPREHENSIVE METABOLIC PANEL
ALT: 4 U/L (ref 0–35)
AST: 18 U/L (ref 0–37)
Albumin: 3.6 g/dL (ref 3.5–5.2)
Alkaline Phosphatase: 72 U/L (ref 39–117)
BUN: 24 mg/dL — ABNORMAL HIGH (ref 6–23)
CO2: 27 meq/L (ref 19–32)
Calcium: 9.2 mg/dL (ref 8.4–10.5)
Chloride: 104 mEq/L (ref 96–112)
Creatinine, Ser: 1.3 mg/dL — ABNORMAL HIGH (ref 0.4–1.2)
GFR: 42.38 mL/min — ABNORMAL LOW (ref >60.00)
GLUCOSE: 145 mg/dL — AB (ref 70–99)
Potassium: 3.7 mEq/L (ref 3.5–5.1)
Sodium: 142 mEq/L (ref 135–145)
Total Bilirubin: 0.6 mg/dL (ref 0.2–1.2)
Total Protein: 7.1 g/dL (ref 6.0–8.3)

## 2014-01-21 MED ORDER — INSULIN GLARGINE 100 UNIT/ML ~~LOC~~ SOLN: 27.0000 [IU] | Freq: Every day | SUBCUTANEOUS | Status: DC

## 2014-01-21 MED ORDER — HYDROCODONE-ACETAMINOPHEN 5-325 MG PO TABS: 1.0000 | ORAL_TABLET | Freq: Three times a day (TID) | ORAL | Status: DC | PRN

## 2014-01-21 NOTE — Progress Notes (Signed)
Pre visit review using our clinic review tool, if applicable. No additional management support is needed unless otherwise documented below in the visit note. 

## 2014-01-21 NOTE — Assessment & Plan Note (Signed)
S/p recent admission for prerenal azotemia. She appears euvolemic today. Repeat renal function today shows mild increase in BUN/Cr compared with previous, however she has been started on Furosemide, and this likely reflects her baseline BUN/Cr. Will see her back in 4 weeks and plan to repeat renal function at that time.

## 2014-01-21 NOTE — Patient Instructions (Signed)
We will check labs today.  Follow up in 2-3 weeks or as needed.

## 2014-01-21 NOTE — Progress Notes (Signed)
Subjective:    Patient ID: [name], female    DOB: 03/21/1929, 80 y.o.   MRN: [id number]  HPI 80YO female presents for hospital follow up.  Recently hospitalized [facility] over Memorial Day weekend with fever, chills, confusion. Thought to have UTI and acute renal failure. Started on antibiotics, but did not continue on discharge. Discharged from hospital on Monday last week. Continues to feel weak. BG have been running higher, over 200. No appetite, poor po intake, however tolerating fluids. No nausea. No diarrhea.    Review of Systems  Constitutional: Positive for activity change, appetite change and fatigue. Negative for fever, chills and unexpected weight change.  HENT: Negative for congestion, ear pain, sinus pressure, sore throat, trouble swallowing and voice change.   Eyes: Negative for visual disturbance.  Respiratory: Negative for cough, shortness of breath, wheezing and stridor.   Cardiovascular: Positive for leg swelling. Negative for chest pain and palpitations.  Gastrointestinal: Negative for nausea, vomiting, abdominal pain, diarrhea, constipation, blood in stool, abdominal distention and anal bleeding.  Genitourinary: Negative for dysuria and flank pain.  Musculoskeletal: Positive for arthralgias. Negative for gait problem, myalgias and neck pain.  Skin: Positive for wound. Negative for color change and rash.  Neurological: Negative for dizziness and headaches.  Hematological: Negative for adenopathy. Does not bruise/bleed easily.  Psychiatric/Behavioral: Negative for suicidal ideas, sleep disturbance and dysphoric mood. The patient is not nervous/anxious.        Objective:    BP 142/74  Pulse 66  Temp(Src) 97.9 F (36.6 C) (Oral)  Ht 4' 10.9" (1.496 m)  Wt 180 lb 8 oz (81.874 kg)  BMI 36.58 kg/m2  SpO2 98% Physical Exam  Constitutional: She is oriented to person, place, and time. She appears well-developed and well-nourished. No distress.  HENT:  Head:  Normocephalic and atraumatic.  Right Ear: External ear normal.  Left Ear: External ear normal.  Nose: Nose normal.  Mouth/Throat: Oropharynx is clear and moist. No oropharyngeal exudate.  Eyes: Conjunctivae are normal. Pupils are equal, round, and reactive to light. Right eye exhibits no discharge. Left eye exhibits no discharge. No scleral icterus.  Neck: Normal range of motion. Neck supple. No tracheal deviation present. No thyromegaly present.  Cardiovascular: Normal rate, regular rhythm, normal heart sounds and intact distal pulses.  Exam reveals no gallop and no friction rub.   No murmur heard. Pulmonary/Chest: Effort normal and breath sounds normal. No accessory muscle usage. Not tachypneic. No respiratory distress. She has no decreased breath sounds. She has no wheezes. She has no rhonchi. She has no rales. She exhibits no tenderness.  Musculoskeletal: Normal range of motion. She exhibits no edema and no tenderness.  Lymphadenopathy:    She has no cervical adenopathy.  Neurological: She is alert and oriented to person, place, and time. No cranial nerve deficit. She exhibits normal muscle tone. Coordination normal.  Skin: Skin is warm and dry. No rash noted. She is not diaphoretic. No erythema. No pallor.  Psychiatric: She has a normal mood and affect. Her behavior is normal. Judgment and thought content normal.          Assessment & Plan:   Problem List Items Addressed This Visit     High   DIABETES MELLITUS, TYPE II      Lab Results  Component Value Date   HGBA1C 7.4* 12/26/2013   BG have been running higher per pt report after recent illness. Will continue Lantus and Humalog. Plan repeat A1c in 03/2014  Relevant Medications      insulin glargine (LANTUS) 100 UNIT/ML injection     Unprioritized   Infection of urinary tract     Urinalysis normal today. Reviewed notes on recent hospitalization which do not mention UTI or antibiotics. Will monitor for any recurrent  symptoms.    Relevant Orders      Comprehensive metabolic panel (Completed)      CBC with Differential (Completed)      POCT urinalysis dipstick (Completed)   Osteoarthrosis, unspecified whether generalized or localized, involving lower leg   Relevant Medications      butalbital-acetaminophen-caffeine (FIORICET, ESGIC) 50-325-40 MG per tablet      HYDROcodone-acetaminophen (NORCO/VICODIN) 5-325 MG per tablet   Prerenal azotemia - Primary     S/p recent admission for prerenal azotemia. She appears euvolemic today. Repeat renal function today shows mild increase in BUN/Cr compared with previous, however she has been started on Furosemide, and this likely reflects her baseline BUN/Cr. Will see her back in 4 weeks and plan to repeat renal function at that time.        Return in about 2 weeks (around 02/04/2014).

## 2014-01-21 NOTE — Assessment & Plan Note (Signed)
Lab Results  Component Value Date   HGBA1C 7.4* 12/26/2013   BG have been running higher per pt report after recent illness. Will continue Lantus and Humalog. Plan repeat A1c in 03/2014

## 2014-01-21 NOTE — Assessment & Plan Note (Signed)
Urinalysis normal today. Reviewed notes on recent hospitalization which do not mention UTI or antibiotics. Will monitor for any recurrent symptoms.

## 2014-01-22 ENCOUNTER — Telehealth: Payer: Self-pay | Admitting: *Deleted

## 2014-01-22 NOTE — Telephone Encounter (Signed)
Home health nurse called stating that pt has a red bruised swollen area, that itches on her abdominal, this area is where she was given shots at while she was in the hospital.  Pt informed the home health nurse that this also happened last time she received a shot on her arm.

## 2014-01-22 NOTE — Telephone Encounter (Signed)
Maybe we can have her stop by for a nurse visit to check the area?

## 2014-01-24 NOTE — Telephone Encounter (Signed)
Pt states that the blood was only a small spot on a pad, advised pt to monitor it over the weekend and if it gets any worse she will need to be seen at the ED immediately. Pt verbalized understanding.

## 2014-01-24 NOTE — Telephone Encounter (Signed)
Spoke with pt, she states that her injection site is okay, just bruised, However she states that she has blood coming from her "bottom" that started this morning, pt states that she thinks it is due to the blood thinners

## 2014-01-24 NOTE — Telephone Encounter (Signed)
If she has blood from her rectum or in stool, given that she is on anticoagulants, she will need to be evaluated in the ED immediately.

## 2014-02-03 ENCOUNTER — Ambulatory Visit (INDEPENDENT_AMBULATORY_CARE_PROVIDER_SITE_OTHER): Payer: Medicare Other | Admitting: Internal Medicine

## 2014-02-03 ENCOUNTER — Other Ambulatory Visit: Payer: Self-pay | Admitting: Internal Medicine

## 2014-02-03 ENCOUNTER — Encounter: Payer: Self-pay | Admitting: Internal Medicine

## 2014-02-03 VITALS — BP 140/78 | HR 62 | Temp 98.0°F | Wt 179.2 lb

## 2014-02-03 DIAGNOSIS — R3989 Other symptoms and signs involving the genitourinary system: Secondary | ICD-10-CM

## 2014-02-03 DIAGNOSIS — S91109A Unspecified open wound of unspecified toe(s) without damage to nail, initial encounter: Secondary | ICD-10-CM

## 2014-02-03 DIAGNOSIS — I2510 Atherosclerotic heart disease of native coronary artery without angina pectoris: Secondary | ICD-10-CM

## 2014-02-03 DIAGNOSIS — S91101A Unspecified open wound of right great toe without damage to nail, initial encounter: Secondary | ICD-10-CM | POA: Insufficient documentation

## 2014-02-03 DIAGNOSIS — R7989 Other specified abnormal findings of blood chemistry: Secondary | ICD-10-CM

## 2014-02-03 LAB — COMPREHENSIVE METABOLIC PANEL
ALBUMIN: 3.7 g/dL (ref 3.5–5.2)
ALT: 15 U/L (ref 0–35)
AST: 18 U/L (ref 0–37)
Alkaline Phosphatase: 89 U/L (ref 39–117)
BILIRUBIN TOTAL: 0.3 mg/dL (ref 0.2–1.2)
BUN: 25 mg/dL — ABNORMAL HIGH (ref 6–23)
CO2: 28 mEq/L (ref 19–32)
Calcium: 9.2 mg/dL (ref 8.4–10.5)
Chloride: 103 mEq/L (ref 96–112)
Creatinine, Ser: 1.1 mg/dL (ref 0.4–1.2)
GFR: 51.01 mL/min — ABNORMAL LOW (ref >60.00)
Glucose, Bld: 205 mg/dL — ABNORMAL HIGH (ref 70–99)
POTASSIUM: 4.5 meq/L (ref 3.5–5.1)
SODIUM: 138 meq/L (ref 135–145)
TOTAL PROTEIN: 6.7 g/dL (ref 6.0–8.3)

## 2014-02-03 MED ORDER — GENTAMICIN SULFATE 0.1 % EX OINT: 1.0000 "application " | TOPICAL_OINTMENT | Freq: Three times a day (TID) | CUTANEOUS | Status: DC

## 2014-02-03 NOTE — Progress Notes (Signed)
Pre visit review using our clinic review tool, if applicable. No additional management support is needed unless otherwise documented below in the visit note. 

## 2014-02-03 NOTE — Assessment & Plan Note (Addendum)
S/p removal of right great toenail with yellow exudate at nail bed, possible early infection. Wound dressing changed today and topical Neosporin applied with sterile qtip. Will start topical gentamicin. Pt confirms no allergy. If any worsening redness, pain, or swelling, she will call or return to clinic.

## 2014-02-03 NOTE — Assessment & Plan Note (Signed)
S/p recent hospitalization for dehydration and acute renal failure. Symptomatically, improving. Will check renal function with labs today.

## 2014-02-03 NOTE — Progress Notes (Signed)
Subjective:    Patient ID: [name], female    DOB: [birth year], 80 y.o.   MRN: [id number]  HPI 80YO female presents for follow up. Continues to feel tired and weak, however improving compared to previous. No focal symptoms. No nausea, change in appetite. No fever. Appetite slowly improving. Blood sugars continue to be slightly high for her, near 200. Compliant with meds.  Seen by podiatry last week and had part of toenail removed right toe. Has some bleeding at site. Concerned about this. No pain at site. No fever, chills.  Review of Systems  Constitutional: Positive for fatigue. Negative for fever, chills, appetite change and unexpected weight change.  HENT: Negative for congestion, ear pain, sinus pressure, sore throat, trouble swallowing and voice change.   Eyes: Negative for visual disturbance.  Respiratory: Negative for cough, shortness of breath, wheezing and stridor.   Cardiovascular: Negative for chest pain, palpitations and leg swelling.  Gastrointestinal: Negative for nausea, vomiting, abdominal pain, diarrhea, constipation, blood in stool, abdominal distention and anal bleeding.  Genitourinary: Negative for dysuria and flank pain.  Musculoskeletal: Positive for arthralgias and myalgias. Negative for gait problem and neck pain.  Skin: Positive for color change and wound. Negative for rash.  Neurological: Negative for dizziness and headaches.  Hematological: Negative for adenopathy. Does not bruise/bleed easily.  Psychiatric/Behavioral: Negative for suicidal ideas, sleep disturbance and dysphoric mood. The patient is not nervous/anxious.        Objective:    BP 140/78  Pulse 62  Temp(Src) 98 F (36.7 C) (Oral)  Wt 179 lb 4 oz (81.307 kg)  SpO2 95% Physical Exam  Constitutional: She is oriented to person, place, and time. She appears well-developed and well-nourished. No distress.  HENT:  Head: Normocephalic and atraumatic.  Right Ear: External ear normal.  Left  Ear: External ear normal.  Nose: Nose normal.  Mouth/Throat: Oropharynx is clear and moist. No oropharyngeal exudate.  Eyes: Conjunctivae are normal. Pupils are equal, round, and reactive to light. Right eye exhibits no discharge. Left eye exhibits no discharge. No scleral icterus.  Neck: Normal range of motion. Neck supple. No tracheal deviation present. No thyromegaly present.  Cardiovascular: Normal rate, regular rhythm, normal heart sounds and intact distal pulses.  Exam reveals no gallop and no friction rub.   No murmur heard. Pulmonary/Chest: Effort normal and breath sounds normal. No accessory muscle usage. Not tachypneic. No respiratory distress. She has no decreased breath sounds. She has no wheezes. She has no rhonchi. She has no rales. She exhibits no tenderness.  Musculoskeletal: Normal range of motion. She exhibits no edema and no tenderness.  Lymphadenopathy:    She has no cervical adenopathy.  Neurological: She is alert and oriented to person, place, and time. No cranial nerve deficit. She exhibits normal muscle tone. Coordination normal.  Skin: Skin is warm and dry. No rash noted. She is not diaphoretic. There is erythema. No pallor.     Psychiatric: She has a normal mood and affect. Her behavior is normal. Judgment and thought content normal.          Assessment & Plan:   Problem List Items Addressed This Visit     Unprioritized   Open wound of right great toe - Primary     S/p removal of right great toenail with yellow exudate at nail bed, possible early infection. Wound dressing changed today and topical Neosporin applied with sterile qtip. Will start topical gentamicin. Pt confirms no allergy. If any worsening redness,  pain, or swelling, she will call or return to clinic.    Relevant Medications      gentamicin (GARAMYCIN) ointment 0.1%   Prerenal azotemia     S/p recent hospitalization for dehydration and acute renal failure. Symptomatically, improving. Will  check renal function with labs today.    Relevant Orders      Comp Met (CMET)       Return in about 7 weeks (around 03/24/2014) for Recheck of Diabetes.

## 2014-02-11 ENCOUNTER — Other Ambulatory Visit: Payer: Self-pay | Admitting: Internal Medicine

## 2014-02-17 ENCOUNTER — Telehealth: Payer: Self-pay | Admitting: *Deleted

## 2014-02-17 NOTE — Telephone Encounter (Signed)
[name], home health nurse, called stating that pt's blood sugar was 292 today and has been even in the 300's, her blood sugar has been consistently elevated this high since she has been out of the hospital.

## 2014-02-17 NOTE — Telephone Encounter (Signed)
Notified pt. 

## 2014-02-17 NOTE — Telephone Encounter (Signed)
Please have her increase Lantus to 30units daily and monitor blood sugars 2-3 times daily and report readings weekly.

## 2014-02-19 ENCOUNTER — Encounter: Payer: Self-pay | Admitting: Surgery

## 2014-02-21 ENCOUNTER — Other Ambulatory Visit: Payer: Self-pay | Admitting: Internal Medicine

## 2014-02-27 ENCOUNTER — Encounter: Payer: Self-pay | Admitting: General Surgery

## 2014-03-03 ENCOUNTER — Encounter: Payer: Self-pay | Admitting: Internal Medicine

## 2014-03-05 ENCOUNTER — Telehealth: Payer: Self-pay | Admitting: *Deleted

## 2014-03-05 DIAGNOSIS — IMO0002 Reserved for concepts with insufficient information to code with codable children: Secondary | ICD-10-CM

## 2014-03-05 DIAGNOSIS — M1710 Unilateral primary osteoarthritis, unspecified knee: Secondary | ICD-10-CM

## 2014-03-05 MED ORDER — HYDROCODONE-ACETAMINOPHEN 5-325 MG PO TABS: 1.0000 | ORAL_TABLET | Freq: Three times a day (TID) | ORAL | Status: DC | PRN

## 2014-03-05 NOTE — Telephone Encounter (Signed)
Pts daughter called requesting a Vicodin refill.  Last refill 6.2.15, last OV 6.15.15, next OV 8.6.15.  Please advise refill

## 2014-03-05 NOTE — Telephone Encounter (Signed)
Spoke with pts daughter, advised Rx ready for pick up 

## 2014-03-05 NOTE — Telephone Encounter (Signed)
Fine to refill Hydrocodone 

## 2014-03-19 LAB — HM DIABETES FOOT EXAM

## 2014-03-20 LAB — HM DIABETES EYE EXAM

## 2014-03-22 ENCOUNTER — Encounter: Payer: Self-pay | Admitting: General Surgery

## 2014-03-22 ENCOUNTER — Encounter: Payer: Self-pay | Admitting: Surgery

## 2014-03-24 ENCOUNTER — Telehealth: Payer: Self-pay | Admitting: *Deleted

## 2014-03-24 NOTE — Telephone Encounter (Signed)
Fine to send UA and culture 

## 2014-03-24 NOTE — Telephone Encounter (Signed)
Notified nurse

## 2014-03-24 NOTE — Telephone Encounter (Signed)
Home nurse, Neysa BonitoChristy, left message stating that pt is having low back pain that is not relieved with pain medication, weak and fatigued, weight 183lb.  Asking for order for urinalysis and culture.  Pt sees Dr Kirke CorinArida today at 2 and sees you on Thursday.

## 2014-03-25 ENCOUNTER — Ambulatory Visit (INDEPENDENT_AMBULATORY_CARE_PROVIDER_SITE_OTHER): Payer: Medicare Other | Admitting: Cardiovascular Disease

## 2014-03-25 ENCOUNTER — Encounter: Payer: Self-pay | Admitting: Cardiovascular Disease

## 2014-03-25 VITALS — BP 120/77 | HR 65 | Ht 59.0 in | Wt 180.0 lb

## 2014-03-25 DIAGNOSIS — I209 Angina pectoris, unspecified: Secondary | ICD-10-CM

## 2014-03-25 DIAGNOSIS — I10 Essential (primary) hypertension: Secondary | ICD-10-CM

## 2014-03-25 DIAGNOSIS — I2510 Atherosclerotic heart disease of native coronary artery without angina pectoris: Secondary | ICD-10-CM

## 2014-03-25 DIAGNOSIS — I25119 Atherosclerotic heart disease of native coronary artery with unspecified angina pectoris: Secondary | ICD-10-CM

## 2014-03-25 DIAGNOSIS — I5032 Chronic diastolic (congestive) heart failure: Secondary | ICD-10-CM

## 2014-03-25 DIAGNOSIS — E785 Hyperlipidemia, unspecified: Secondary | ICD-10-CM

## 2014-03-25 DIAGNOSIS — R0602 Shortness of breath: Secondary | ICD-10-CM

## 2014-03-25 NOTE — Assessment & Plan Note (Signed)
She appears to be euvolemic on current dose of Lasix. Blood pressure is under excellent control.

## 2014-03-25 NOTE — Assessment & Plan Note (Signed)
She is doing reasonably well. She has no symptoms suggestive of angina. Continue medical therapy.

## 2014-03-25 NOTE — Progress Notes (Signed)
Primary care physician: Dr. Ronna Polio   HPI  This is an 80 year old female who is here today for a followup visit regarding coronary artery disease and chronic diastolic heart failure. She has known history of  DM, refractory HTN, pancreatic insufficiency, and osteoarthritis .  She was admitted on January 6 with worsening dyspnea. She was noted to have right lower lobe pneumonia. She was treated with antibiotics. BNP was mildly elevated. She underwent a pharmacologic nuclear stress test which showed evidence of anterior wall ischemia with normal ejection fraction. I proceeded with cardiac catheterization which showed tonic occluded mid LAD with right-to-left collaterals. There was high-grade stenosis in a large OM 2 extending to the apex. This was treated with angioplasty and drug-eluting stent placement.  She has been doing reasonably well especially after we increased the dose of Lasix to 40 mg once daily. She denies chest pain. Dyspnea is stable.    Allergies  Allergen Reactions  . Citalopram Hydrobromide     REACTION: nausea, dizzy, dry mouth  . Diphenhydramine     Other reaction(s): Unknown  . Diphenhydramine Hcl     REACTION: unspecified  . Metoclopramide     Other reaction(s): Unknown  . Metoclopramide Hcl     REACTION: unspecified  . Paroxetine     REACTION: hallucinations  . Tape Other (See Comments) and Rash     Current Outpatient Prescriptions on File Prior to Visit  Medication Sig Dispense Refill  . albuterol (PROVENTIL HFA;VENTOLIN HFA) 108 (90 BASE) MCG/ACT inhaler Inhale 2 puffs into the lungs every 6 (six) hours as needed for wheezing or shortness of breath.  1 Inhaler  6  . ALPRAZolam (XANAX) 1 MG tablet TAKE 1/2 TABLET IN THE MORNING TAKE 1/2 TABLET AT LUNCH AND TAKE 1 TABLET AT BEDTIME IF NEEDED  60 tablet  3  . aspirin 81 MG tablet Take 81 mg by mouth daily.        . busPIRone (BUSPAR) 10 MG tablet TAKE ONE TABLET TWICE DAILY  180 tablet  1  .  Carbidopa-Levodopa ER (SINEMET CR) 25-100 MG tablet controlled release TAKE ONE (1) TABLET BY MOUTH 3 TIMES DAILY  270 tablet  1  . clopidogrel (PLAVIX) 75 MG tablet Take 1 tablet (75 mg total) by mouth daily with breakfast.  30 tablet  5  . fluticasone (FLONASE) 50 MCG/ACT nasal spray Place 2 sprays into both nostrils daily.  16 g  5  . furosemide (LASIX) 40 MG tablet Take 1 tablet (40 mg total) by mouth daily.  30 tablet  0  . gentamicin ointment (GARAMYCIN) 0.1 % Apply 1 application topically 3 (three) times daily.  30 g  0  . insulin glargine (LANTUS) 100 UNIT/ML injection Inject 0.27 mLs (27 Units total) into the skin at bedtime.  10 mL  6  . insulin lispro (HUMALOG) 100 UNIT/ML injection Inject 5-17 Units into the skin 3 (three) times daily before meals.      Marland Kitchen levothyroxine (SYNTHROID, LEVOTHROID) 75 MCG tablet Take 1 tablet (75 mcg total) by mouth daily.  30 tablet  5  . lisinopril (PRINIVIL,ZESTRIL) 20 MG tablet TAKE ONE TABLET TWICE DAILY  180 tablet  1  . metoprolol succinate (TOPROL-XL) 100 MG 24 hr tablet Take 1 tablet (100 mg total) by mouth daily. Take with or immediately following a meal.  90 tablet  3  . Multiple Vitamins-Minerals (CENTRUM SILVER PO) Take 1 tablet by mouth daily.        Marland Kitchen  omeprazole (PRILOSEC) 20 MG capsule Take 1 capsule (20 mg total) by mouth daily.  90 capsule  0  . ondansetron (ZOFRAN) 4 MG tablet Take 1 tablet (4 mg total) by mouth every 8 (eight) hours as needed.  90 tablet  11  . Pancrelipase, Lip-Prot-Amyl, 5000 UNITS CPEP Take 3 capsules (15,000 Units total) by mouth 3 (three) times daily.  270 capsule  11  . pravastatin (PRAVACHOL) 20 MG tablet TAKE ONE (1) TABLET EACH DAY  90 tablet  2  . rOPINIRole (REQUIP) 0.25 MG tablet Take 0.25 mg by mouth at bedtime.      . triamcinolone cream (KENALOG) 0.1 % Apply topically 2 (two) times daily.  30 g  0   No current facility-administered medications on file prior to visit.     Past Medical History  Diagnosis  Date  . Anxiety   . CHF (congestive heart failure)   . Depression   . Diabetes mellitus     [facility]  . GERD (gastroesophageal reflux disease)   . Hypertension   . Hypothyroidism   . Osteoporosis   . Allergy   . Hyperlipidemia   . RLS (restless legs syndrome)   . Pancreatic disease     pancreatic failure  . Urinary incontinence   . Pancreatitis     Pancreatic resection/ open wound- [facility] 11/04  . Pneumonia     Pneumonia/ Emphysema 05/05  . Diplopia     Diplopia- ? 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 History of pleural empyema   . Sepsis   . Frequent falls   . Arthritis     [facility]  . Empyema lung      Past Surgical History  Procedure Laterality Date  . Breast lumpectomy      Lumpectomy right breast  . 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 Abdominal hysterectomy      Hysteroscopy/ D & C ([place]) 12/05  . Removal of pancreas  2004  . Kyphoplasty  2005  . Broken shoulder and orbital bone  2005  . Cholecystectomy  1973  . Thoracentesis    . Vaginal delivery      9  . Cardiac catheterization  08/29/2013    x1 stent @ [facility]  . Cataract surgery       Family History  Problem Relation Age of Onset  . Heart disease Mother   . Heart attack Mother   . Hypertension Mother   . Heart attack Father      History   Social History  . Marital Status: Widowed    Spouse Name: N/A    Number of Children: 7  . Years of Education: 6   Occupational History  . 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 Smoking status: Never Smoker   . Smokeless tobacco: Never Used  . Alcohol Use: No  . Drug Use: No  . Sexual Activity: Not on file   Other Topics Concern  . 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 Has 7 children.            Has Kendal HymenBonnie,  daughter, as health care POA   Not sure about DNR   Permission to speak to daughter-in-law, Georgian Congela Lenig, regarding health care matters.  ROS A 10 point review of system was performed. It is negative other than that mentioned in the history of present illness.   PHYSICAL EXAM   BP 120/77  Pulse 65  Ht 4\' 11"  (1.499 m)  Wt 180 lb (81.647 kg)  BMI 36.34 kg/m2 Constitutional: She is oriented to person, place, and time. She appears well-developed and well-nourished. No distress.  HENT: No nasal discharge.  Head: Normocephalic and atraumatic.  Eyes: Pupils are equal and round. No discharge.  Neck: Normal range of motion. Neck supple. No JVD present. No thyromegaly present.  Cardiovascular: Normal rate, regular rhythm, normal heart sounds. Exam reveals no gallop and no friction rub. No murmur heard.  Pulmonary/Chest: Effort normal and breath sounds normal. No stridor. No respiratory distress. She has no wheezes. She has no rales. She exhibits no tenderness.  Abdominal: Soft. Bowel sounds are normal. She exhibits no distension. There is no tenderness. There is no rebound and no guarding.  Musculoskeletal: Normal range of motion. She exhibits no edema and no tenderness.  Neurological: She is alert and oriented to person, place, and time. Coordination normal.  Skin: Skin is warm and dry. No rash noted. She is not diaphoretic. No erythema. No pallor.  Psychiatric: She has a normal mood and affect. Her behavior is normal. Judgment and thought content normal.   AOZ:HYQMV  Rhythm  -First degree A-V block  PRi = 248 Low voltage in precordial leads.   -Poor R-wave progression -may be secondary to pulmonary disease   consider old anterior infarct.   -  Nonspecific T-abnormality.     ASSESSMENT AND PLAN

## 2014-03-25 NOTE — Assessment & Plan Note (Signed)
Continue treatment with pravastatin 

## 2014-03-25 NOTE — Assessment & Plan Note (Signed)
Blood pressure is optimally controlled. 

## 2014-03-25 NOTE — Patient Instructions (Signed)
Your physician recommends that you schedule a follow-up appointment in: 4 months with Dr. Arida  

## 2014-03-27 ENCOUNTER — Ambulatory Visit (INDEPENDENT_AMBULATORY_CARE_PROVIDER_SITE_OTHER): Payer: Medicare Other | Admitting: Internal Medicine

## 2014-03-27 ENCOUNTER — Encounter: Payer: Self-pay | Admitting: Internal Medicine

## 2014-03-27 VITALS — BP 136/76 | HR 69 | Temp 98.0°F | Ht 58.9 in | Wt 180.5 lb

## 2014-03-27 DIAGNOSIS — R5381 Other malaise: Secondary | ICD-10-CM

## 2014-03-27 DIAGNOSIS — M79609 Pain in unspecified limb: Secondary | ICD-10-CM

## 2014-03-27 DIAGNOSIS — R5383 Other fatigue: Secondary | ICD-10-CM

## 2014-03-27 DIAGNOSIS — I2510 Atherosclerotic heart disease of native coronary artery without angina pectoris: Secondary | ICD-10-CM

## 2014-03-27 DIAGNOSIS — E119 Type 2 diabetes mellitus without complications: Secondary | ICD-10-CM

## 2014-03-27 DIAGNOSIS — I10 Essential (primary) hypertension: Secondary | ICD-10-CM

## 2014-03-27 DIAGNOSIS — M79606 Pain in leg, unspecified: Secondary | ICD-10-CM

## 2014-03-27 LAB — CBC WITH DIFFERENTIAL/PLATELET
BASOS ABS: 0 10*3/uL (ref 0.0–0.1)
Basophils Relative: 0.3 % (ref 0.0–3.0)
EOS PCT: 2.2 % (ref 0.0–5.0)
Eosinophils Absolute: 0.1 10*3/uL (ref 0.0–0.7)
HEMATOCRIT: 37.8 % (ref 36.0–46.0)
HEMOGLOBIN: 12.4 g/dL (ref 12.0–15.0)
LYMPHS ABS: 1.4 10*3/uL (ref 0.7–4.0)
LYMPHS PCT: 21.8 % (ref 12.0–46.0)
MCHC: 32.8 g/dL (ref 30.0–36.0)
MCV: 92 fl (ref 78.0–100.0)
MONOS PCT: 7.5 % (ref 3.0–12.0)
Monocytes Absolute: 0.5 10*3/uL (ref 0.1–1.0)
NEUTROS ABS: 4.4 10*3/uL (ref 1.4–7.7)
Neutrophils Relative %: 68.2 % (ref 43.0–77.0)
Platelets: 127 10*3/uL — ABNORMAL LOW (ref 150.0–400.0)
RBC: 4.11 Mil/uL (ref 3.87–5.11)
RDW: 15.1 % (ref 11.5–15.5)
WBC: 6.5 10*3/uL (ref 4.0–10.5)

## 2014-03-27 LAB — COMPREHENSIVE METABOLIC PANEL
ALT: 6 U/L (ref 0–35)
AST: 19 U/L (ref 0–37)
Albumin: 3.6 g/dL (ref 3.5–5.2)
Alkaline Phosphatase: 98 U/L (ref 39–117)
BILIRUBIN TOTAL: 0.4 mg/dL (ref 0.2–1.2)
BUN: 22 mg/dL (ref 6–23)
CALCIUM: 8.9 mg/dL (ref 8.4–10.5)
CHLORIDE: 99 meq/L (ref 96–112)
CO2: 28 meq/L (ref 19–32)
CREATININE: 1.2 mg/dL (ref 0.4–1.2)
GFR: 46.08 mL/min — AB (ref >60.00)
GLUCOSE: 181 mg/dL — AB (ref 70–99)
Potassium: 4.1 mEq/L (ref 3.5–5.1)
Sodium: 140 mEq/L (ref 135–145)
Total Protein: 6.9 g/dL (ref 6.0–8.3)

## 2014-03-27 LAB — TSH: TSH: 0.35 u[IU]/mL (ref 0.35–4.50)

## 2014-03-27 LAB — POCT URINALYSIS DIPSTICK
Bilirubin, UA: NEGATIVE
Blood, UA: NEGATIVE
Glucose, UA: NEGATIVE
KETONES UA: NEGATIVE
Nitrite, UA: NEGATIVE
Protein, UA: NEGATIVE
Spec Grav, UA: 1.01
Urobilinogen, UA: 0.2
pH, UA: 5

## 2014-03-27 MED ORDER — HYDROCODONE-ACETAMINOPHEN 5-325 MG PO TABS: 1.0000 | ORAL_TABLET | Freq: Three times a day (TID) | ORAL | Status: DC | PRN

## 2014-03-27 MED ORDER — INSULIN LISPRO 100 UNIT/ML ~~LOC~~ SOLN: 8.0000 [IU] | Freq: Three times a day (TID) | SUBCUTANEOUS | Status: DC

## 2014-03-27 MED ORDER — GABAPENTIN 100 MG PO CAPS: 100.0000 mg | ORAL_CAPSULE | Freq: Three times a day (TID) | ORAL | Status: DC

## 2014-03-27 MED ORDER — INSULIN GLARGINE 100 UNIT/ML ~~LOC~~ SOLN: 35.0000 [IU] | Freq: Every day | SUBCUTANEOUS | Status: DC

## 2014-03-27 NOTE — Assessment & Plan Note (Signed)
Lab Results  Component Value Date   HGBA1C 7.4* 12/26/2013   A1c checked by her endocrinologist was 6.9%. Reviewed types of insulin and duration of action. Continue current medications.

## 2014-03-27 NOTE — Progress Notes (Signed)
Pre visit review using our clinic review tool, if applicable. No additional management support is needed unless otherwise documented below in the visit note. 

## 2014-03-27 NOTE — Patient Instructions (Signed)
Start Neurontin 100mg  at bedtime to help with leg pain. If well-tolerated, increase to 3 times per day.  Follow up in 4 weeks or sooner as needed.

## 2014-03-27 NOTE — Assessment & Plan Note (Signed)
Generally feeling poorly. No focal symptoms. Will recheck UA. Recent UA was normal. Will check CBC, CMP, TSH. Encouraged adequate hydration, avoidance of being outdoors with hotter temps.

## 2014-03-27 NOTE — Assessment & Plan Note (Signed)
BP Readings from Last 3 Encounters:  03/27/14 136/76  03/25/14 120/77  02/03/14 140/78   BP well controlled on current medications. Will check renal function with labs today.

## 2014-03-27 NOTE — Assessment & Plan Note (Signed)
Bilateral leg pain 2/2 OA and neuropathy. Will add Neurontin 100mg  tid to see if any improvement. Continue Hydrocodone for severe pain.

## 2014-03-27 NOTE — Progress Notes (Signed)
Subjective:    Patient ID: [name], female    DOB: 11/10/1928, 80 y.o.   MRN: [id number]  HPI 80YO female presents for follow up.  Not feeling well. Legs hurting. Keeping her up at night. Taking Vicodin with minimal improvement.  DM - Having some low sugars. Seen by Dr. Tedd SiasSolum. HgbA1c was 6.9% on 7/29. Was confused took Humalag 30units daily and had some low BG. Now taking Lantus 35units daily and Humalog sliding scale.   Review of Systems  Constitutional: Positive for fatigue. Negative for fever, chills, appetite change and unexpected weight change.  Eyes: Negative for visual disturbance.  Respiratory: Negative for cough and shortness of breath.   Cardiovascular: Negative for chest pain and leg swelling.  Gastrointestinal: Negative for nausea, vomiting, abdominal pain, diarrhea and constipation.  Genitourinary: Negative for dysuria, urgency, flank pain and pelvic pain.  Musculoskeletal: Positive for arthralgias, gait problem and myalgias.  Skin: Negative for color change and rash.  Neurological: Positive for weakness. Negative for numbness.  Hematological: Negative for adenopathy. Does not bruise/bleed easily.  Psychiatric/Behavioral: Positive for sleep disturbance. Negative for dysphoric mood. The patient is not nervous/anxious.        Objective:    BP 136/76  Pulse 69  Temp(Src) 98 F (36.7 C) (Oral)  Ht 4' 10.9" (1.496 m)  Wt 180 lb 8 oz (81.874 kg)  BMI 36.58 kg/m2  SpO2 97% Physical Exam  Constitutional: She is oriented to person, place, and time. She appears well-developed and well-nourished. No distress.  Elderly female, using walker  HENT:  Head: Normocephalic and atraumatic.  Right Ear: External ear normal.  Left Ear: External ear normal.  Nose: Nose normal.  Mouth/Throat: Oropharynx is clear and moist. No oropharyngeal exudate.  Eyes: Conjunctivae are normal. Pupils are equal, round, and reactive to light. Right eye exhibits no discharge. Left eye  exhibits no discharge. No scleral icterus.  Neck: Normal range of motion. Neck supple. No tracheal deviation present. No thyromegaly present.  Cardiovascular: Normal rate, regular rhythm, normal heart sounds and intact distal pulses.  Exam reveals no gallop and no friction rub.   No murmur heard. Pulmonary/Chest: Effort normal and breath sounds normal. No accessory muscle usage. Not tachypneic. No respiratory distress. She has no decreased breath sounds. She has no wheezes. She has no rhonchi. She has no rales. She exhibits no tenderness.  Musculoskeletal: Normal range of motion. She exhibits no edema and no tenderness.  Lymphadenopathy:    She has no cervical adenopathy.  Neurological: She is alert and oriented to person, place, and time. No cranial nerve deficit. She exhibits normal muscle tone. Coordination normal.  Skin: Skin is warm and dry. No rash noted. She is not diaphoretic. No erythema. No pallor.  Psychiatric: She has a normal mood and affect. Her behavior is normal. Judgment and thought content normal.          Assessment & Plan:   Problem List Items Addressed This Visit     High   DIABETES MELLITUS, TYPE II - Primary      Lab Results  Component Value Date   HGBA1C 7.4* 12/26/2013   A1c checked by her endocrinologist was 6.9%. Reviewed types of insulin and duration of action. Continue current medications.    Relevant Medications      insulin glargine (LANTUS) 100 UNIT/ML injection      insulin lispro (HUMALOG) 100 UNIT/ML injection   Other Relevant Orders      POCT Urinalysis Dipstick  Unprioritized   HYPERTENSION      BP Readings from Last 3 Encounters:  03/27/14 136/76  03/25/14 120/77  02/03/14 140/78   BP well controlled on current medications. Will check renal function with labs today.    Other malaise and fatigue     Generally feeling poorly. No focal symptoms. Will recheck UA. Recent UA was normal. Will check CBC, CMP, TSH. Encouraged adequate  hydration, avoidance of being outdoors with hotter temps.    Relevant Orders      Comprehensive metabolic panel      CBC with Differential      TSH   Pain in limb     Bilateral leg pain 2/2 OA and neuropathy. Will add Neurontin 100mg  tid to see if any improvement. Continue Hydrocodone for severe pain.    Relevant Medications      gabapentin (NEURONTIN) capsule       Return in about 4 weeks (around 04/24/2014).

## 2014-03-27 NOTE — Addendum Note (Signed)
Addended by: Montine CircleMALDONADO, Sherrine Salberg D on: 03/27/2014 02:44 PM   Modules accepted: Orders

## 2014-03-28 ENCOUNTER — Telehealth: Payer: Self-pay | Admitting: Internal Medicine

## 2014-03-28 NOTE — Telephone Encounter (Signed)
Relevant patient education mailed to patient.  

## 2014-03-29 LAB — CULTURE, URINE COMPREHENSIVE
COLONY COUNT: NO GROWTH
ORGANISM ID, BACTERIA: NO GROWTH

## 2014-04-07 ENCOUNTER — Other Ambulatory Visit: Payer: Self-pay | Admitting: Internal Medicine

## 2014-04-07 NOTE — Telephone Encounter (Signed)
Ok refill? 

## 2014-04-08 ENCOUNTER — Telehealth: Payer: Self-pay | Admitting: Internal Medicine

## 2014-04-08 MED ORDER — HYDROCODONE-ACETAMINOPHEN 5-325 MG PO TABS: 1.0000 | ORAL_TABLET | Freq: Three times a day (TID) | ORAL | Status: DC | PRN

## 2014-04-08 NOTE — Telephone Encounter (Signed)
Okay to refill? 

## 2014-04-08 NOTE — Telephone Encounter (Signed)
Rx printed for signature. 

## 2014-04-08 NOTE — Telephone Encounter (Signed)
Fine to refill 

## 2014-04-08 NOTE — Telephone Encounter (Signed)
Rx faxed to pharmacy  

## 2014-04-08 NOTE — Telephone Encounter (Signed)
HYDROcodone-acetaminophen (NORCO/VICODIN) 5-325 MG per  ° °

## 2014-04-18 ENCOUNTER — Encounter: Payer: Self-pay | Admitting: Internal Medicine

## 2014-04-22 ENCOUNTER — Encounter: Payer: Self-pay | Admitting: Surgery

## 2014-04-23 ENCOUNTER — Other Ambulatory Visit: Payer: Self-pay | Admitting: Internal Medicine

## 2014-04-23 ENCOUNTER — Other Ambulatory Visit: Payer: Self-pay

## 2014-04-23 MED ORDER — AMLODIPINE BESYLATE 5 MG PO TABS: 5.0000 mg | ORAL_TABLET | Freq: Every day | ORAL | Status: DC

## 2014-04-23 NOTE — Telephone Encounter (Signed)
Refill sent for amlodipine 5 mg take one tablet daily per patient.

## 2014-04-29 ENCOUNTER — Ambulatory Visit: Payer: Medicare Other | Admitting: Internal Medicine

## 2014-04-30 ENCOUNTER — Telehealth: Payer: Self-pay | Admitting: *Deleted

## 2014-04-30 NOTE — Telephone Encounter (Signed)
Fine to fill. 

## 2014-04-30 NOTE — Telephone Encounter (Signed)
pts daughter called requesting Vicodin refill.  Last refill 8.18.15, last OV 8.6.15, next OV 9.25.15.  Please advise refill

## 2014-05-01 ENCOUNTER — Other Ambulatory Visit: Payer: Self-pay | Admitting: *Deleted

## 2014-05-01 MED ORDER — HYDROCODONE-ACETAMINOPHEN 5-325 MG PO TABS: 1.0000 | ORAL_TABLET | Freq: Three times a day (TID) | ORAL | Status: DC | PRN

## 2014-05-16 ENCOUNTER — Ambulatory Visit: Payer: Medicare Other | Admitting: Internal Medicine

## 2014-05-17 ENCOUNTER — Other Ambulatory Visit: Payer: Self-pay | Admitting: Internal Medicine

## 2014-05-22 ENCOUNTER — Encounter: Payer: Self-pay | Admitting: Surgery

## 2014-05-26 ENCOUNTER — Other Ambulatory Visit: Payer: Self-pay | Admitting: *Deleted

## 2014-05-26 MED ORDER — "INSULIN SYRINGE-NEEDLE U-100 31G X 5/16"" 0.5 ML MISC": Status: DC

## 2014-06-09 ENCOUNTER — Other Ambulatory Visit: Payer: Self-pay | Admitting: Internal Medicine

## 2014-06-10 ENCOUNTER — Telehealth: Payer: Self-pay | Admitting: *Deleted

## 2014-06-10 ENCOUNTER — Ambulatory Visit (INDEPENDENT_AMBULATORY_CARE_PROVIDER_SITE_OTHER): Payer: Medicare Other | Admitting: Internal Medicine

## 2014-06-10 ENCOUNTER — Encounter: Payer: Self-pay | Admitting: Internal Medicine

## 2014-06-10 VITALS — BP 142/80 | HR 69 | Temp 98.2°F | Ht 58.9 in | Wt 180.5 lb

## 2014-06-10 DIAGNOSIS — I10 Essential (primary) hypertension: Secondary | ICD-10-CM

## 2014-06-10 DIAGNOSIS — Z23 Encounter for immunization: Secondary | ICD-10-CM

## 2014-06-10 DIAGNOSIS — I5032 Chronic diastolic (congestive) heart failure: Secondary | ICD-10-CM

## 2014-06-10 DIAGNOSIS — IMO0002 Reserved for concepts with insufficient information to code with codable children: Secondary | ICD-10-CM

## 2014-06-10 DIAGNOSIS — M179 Osteoarthritis of knee, unspecified: Secondary | ICD-10-CM

## 2014-06-10 DIAGNOSIS — S31109S Unspecified open wound of abdominal wall, unspecified quadrant without penetration into peritoneal cavity, sequela: Secondary | ICD-10-CM

## 2014-06-10 DIAGNOSIS — E119 Type 2 diabetes mellitus without complications: Secondary | ICD-10-CM

## 2014-06-10 DIAGNOSIS — M1710 Unilateral primary osteoarthritis, unspecified knee: Secondary | ICD-10-CM

## 2014-06-10 DIAGNOSIS — I2510 Atherosclerotic heart disease of native coronary artery without angina pectoris: Secondary | ICD-10-CM

## 2014-06-10 MED ORDER — HYDROCODONE-ACETAMINOPHEN 5-325 MG PO TABS: 1.0000 | ORAL_TABLET | Freq: Three times a day (TID) | ORAL | Status: DC | PRN

## 2014-06-10 NOTE — Assessment & Plan Note (Signed)
BG generally well controlled. Will continue current medications. Plan repeat A1c in 07/2014.

## 2014-06-10 NOTE — Telephone Encounter (Signed)
Yes, we should see her in follow up.

## 2014-06-10 NOTE — Assessment & Plan Note (Signed)
Appears euvolemic on exam today. Continue current medications. Follow up in 07/2014 with Dr. Kirke CorinArida.

## 2014-06-10 NOTE — Patient Instructions (Signed)
Flu vaccine today.  Labs and follow up in December.

## 2014-06-10 NOTE — Progress Notes (Signed)
Pre visit review using our clinic review tool, if applicable. No additional management support is needed unless otherwise documented below in the visit note. 

## 2014-06-10 NOTE — Telephone Encounter (Signed)
Pt is scheduled for 08/05/14 at 3:00. LMOM for patient.

## 2014-06-10 NOTE — Progress Notes (Addendum)
Subjective:    Patient ID: [name], female    DOB: 12/01/1928, 80 y.o.   MRN: [id number]  HPI 80YO female presents for follow up.  DM - BG well controlled. Occasional over 200. One low sugar of 50. No symptoms with this.  Continues to have some knee pain. At times severe, aching pain, that prevents her from sleeping at night. Taking Hydrocodone with some improvement.  Abdominal wound - Had skin graft placed. About 50% came off, but remainder held. No fever, chills.  Review of Systems  Constitutional: Negative for fever, chills, appetite change, fatigue and unexpected weight change.  Eyes: Negative for visual disturbance.  Respiratory: Negative for shortness of breath.   Cardiovascular: Negative for chest pain and leg swelling.  Gastrointestinal: Negative for nausea, vomiting, abdominal pain, diarrhea and constipation.  Musculoskeletal: Positive for arthralgias and myalgias.  Skin: Positive for wound. Negative for color change and rash.  Hematological: Negative for adenopathy. Does not bruise/bleed easily.  Psychiatric/Behavioral: Negative for sleep disturbance and dysphoric mood. The patient is not nervous/anxious.        Objective:    BP 142/80  Pulse 69  Temp(Src) 98.2 F (36.8 C) (Oral)  Ht 4' 10.9" (1.496 m)  Wt 180 lb 8 oz (81.874 kg)  BMI 36.58 kg/m2  SpO2 95% Physical Exam  Constitutional: She is oriented to person, place, and time. She appears well-developed and well-nourished. No distress.  HENT:  Head: Normocephalic and atraumatic.  Right Ear: External ear normal.  Left Ear: External ear normal.  Nose: Nose normal.  Mouth/Throat: Oropharynx is clear and moist. No oropharyngeal exudate.  Eyes: Conjunctivae are normal. Pupils are equal, round, and reactive to light. Right eye exhibits no discharge. Left eye exhibits no discharge. No scleral icterus.  Neck: Normal range of motion. Neck supple. No tracheal deviation present. No thyromegaly present.    Cardiovascular: Normal rate, regular rhythm, normal heart sounds and intact distal pulses.  Exam reveals no gallop and no friction rub.   No murmur heard. Pulmonary/Chest: Effort normal. No accessory muscle usage. Not tachypneic. No respiratory distress. She has no decreased breath sounds. She has no wheezes. She has no rhonchi. She has rales (few scattered at bases). She exhibits no tenderness.  Musculoskeletal: She exhibits no edema.       Right knee: She exhibits decreased range of motion.       Left knee: She exhibits decreased range of motion.  Pain in knees bilaterally with flexion/extension  Lymphadenopathy:    She has no cervical adenopathy.  Neurological: She is alert and oriented to person, place, and time. No cranial nerve deficit. She exhibits normal muscle tone. Coordination normal.  Skin: Skin is warm and dry. No rash noted. She is not diaphoretic. No erythema. No pallor.  Psychiatric: She has a normal mood and affect. Her behavior is normal. Judgment and thought content normal.          Assessment & Plan:   Problem List Items Addressed This Visit     High   Diabetes type 2, controlled - Primary     BG generally well controlled. Will continue current medications. Plan repeat A1c in 07/2014.    FAILURE, DIASTOLIC HEART, CHRONIC     Appears euvolemic on exam today. Continue current medications. Follow up in 07/2014 with Dr. Kirke CorinArida.      Unprioritized   Essential hypertension      BP Readings from Last 3 Encounters:  06/10/14 142/80  03/27/14 136/76  03/25/14  120/77   BP well controlled generally on current medications. Will continue.    Open abdominal wall wound     S/p recent skin graft placement. Bandage in place. Will request copy of recent notes from wound center.    Osteoarthrosis, unspecified whether generalized or localized, involving lower leg     Severe OA pain knees. Continue prn hydrocodone for severe pain. Avoiding NSAIDs because of renal  insufficiency.    Relevant Medications      HYDROcodone-acetaminophen (NORCO/VICODIN) 5-325 MG per tablet       Return in about 8 weeks (around 08/05/2014) for Recheck of Diabetes.

## 2014-06-10 NOTE — Assessment & Plan Note (Signed)
S/p recent skin graft placement. Bandage in place. Will request copy of recent notes from wound center.

## 2014-06-10 NOTE — Assessment & Plan Note (Signed)
Severe OA pain knees. Continue prn hydrocodone for severe pain. Avoiding NSAIDs because of renal insufficiency.

## 2014-06-10 NOTE — Assessment & Plan Note (Signed)
BP Readings from Last 3 Encounters:  06/10/14 142/80  03/27/14 136/76  03/25/14 120/77   BP well controlled generally on current medications. Will continue.

## 2014-06-10 NOTE — Telephone Encounter (Signed)
Pt stated she see's a Endocrinologist, do you still want to see this pt for her diabetes in 8 weeks. Please advise

## 2014-06-11 ENCOUNTER — Emergency Department: Payer: Self-pay | Admitting: Emergency Medicine

## 2014-06-11 ENCOUNTER — Other Ambulatory Visit: Payer: Self-pay | Admitting: Internal Medicine

## 2014-06-21 ENCOUNTER — Other Ambulatory Visit: Payer: Self-pay | Admitting: Internal Medicine

## 2014-06-22 ENCOUNTER — Encounter: Payer: Self-pay | Admitting: Surgery

## 2014-07-09 ENCOUNTER — Other Ambulatory Visit: Payer: Self-pay | Admitting: Internal Medicine

## 2014-07-11 ENCOUNTER — Telehealth: Payer: Self-pay | Admitting: *Deleted

## 2014-07-11 ENCOUNTER — Other Ambulatory Visit: Payer: Self-pay | Admitting: Internal Medicine

## 2014-07-11 DIAGNOSIS — J309 Allergic rhinitis, unspecified: Secondary | ICD-10-CM

## 2014-07-11 NOTE — Telephone Encounter (Signed)
Home health nurse left message stating that pt does not feel very well, her vitals are: 99.3 temp, 98% oxygen, 260 sugar.  Pt has been wheezing, pt told nurse that she feels its due to her allergies, and has a decrease in appetite.  Nurse is asking what advise to give to the pt, asking if pt needs to be seen

## 2014-07-11 NOTE — Telephone Encounter (Signed)
Notified home health nurse and also notified pt.  Pt sounded agreeable to being seen at the ED

## 2014-07-11 NOTE — Telephone Encounter (Signed)
Given pt extensive medical history she should be seen and evaluated today. I would recommend urgent care or ED, prefer ED.

## 2014-07-22 ENCOUNTER — Encounter: Payer: Self-pay | Admitting: Surgery

## 2014-07-25 ENCOUNTER — Encounter: Payer: Self-pay | Admitting: Cardiovascular Disease

## 2014-07-25 ENCOUNTER — Ambulatory Visit (INDEPENDENT_AMBULATORY_CARE_PROVIDER_SITE_OTHER): Payer: Medicare Other | Admitting: Cardiovascular Disease

## 2014-07-25 VITALS — BP 174/90 | HR 64 | Ht 59.0 in | Wt 180.5 lb

## 2014-07-25 DIAGNOSIS — E785 Hyperlipidemia, unspecified: Secondary | ICD-10-CM

## 2014-07-25 DIAGNOSIS — R0602 Shortness of breath: Secondary | ICD-10-CM

## 2014-07-25 DIAGNOSIS — I5032 Chronic diastolic (congestive) heart failure: Secondary | ICD-10-CM | POA: Diagnosis not present

## 2014-07-25 DIAGNOSIS — I10 Essential (primary) hypertension: Secondary | ICD-10-CM | POA: Diagnosis not present

## 2014-07-25 DIAGNOSIS — I2510 Atherosclerotic heart disease of native coronary artery without angina pectoris: Secondary | ICD-10-CM

## 2014-07-25 NOTE — Assessment & Plan Note (Signed)
She is doing well with no symptoms suggestive of angina. Continue medical therapy. She continues to be in dual antiplatelet therapy. Plavix can be discontinued after January but she seems to be tolerating this fine and I will probably keep her on it for another 6 months.

## 2014-07-25 NOTE — Patient Instructions (Signed)
Continue same medications.   Your physician wants you to follow-up in: 6 months.  You will receive a reminder letter in the mail two months in advance. If you don't receive a letter, please call our office to schedule the follow-up appointment.  

## 2014-07-25 NOTE — Assessment & Plan Note (Signed)
She appears to be euvolemic on current dose of Lasix. 

## 2014-07-25 NOTE — Progress Notes (Signed)
Primary care physician: Dr. Ronna PolioJennifer [name]   HPI  This is an 80 year old female who is here today for a followup visit regarding coronary artery disease and chronic diastolic heart failure. She has known history of  DM, refractory HTN, pancreatic insufficiency, and osteoarthritis .  She was admitted on January 6 with worsening dyspnea. She was noted to have right lower lobe pneumonia. She was treated with antibiotics. BNP was mildly elevated. She underwent a pharmacologic nuclear stress test which showed evidence of anterior wall ischemia with normal ejection fraction. I proceeded with cardiac catheterization which showed chronically occluded mid LAD with right-to-left collaterals. There was high-grade stenosis in a large OM 2 extending to the apex. This was treated with angioplasty and drug-eluting stent placement.  She is having bad allergies with significant congestion. This is usually a yearly occurrence for her around this time of the year. She has an appointment with the allergy clinic later this month. Blood pressure is elevated today   but the blood pressure has been reasonably controlled lately. She reports having some slurred speech this morning. EMS were called and there was no significant abnormalities. The patient's speech seems to be normal during the visit. She has no other neurologic symptoms.   Allergies  Allergen Reactions  . Citalopram Hydrobromide     REACTION: nausea, dizzy, dry mouth  . Diphenhydramine     Other reaction(s): Unknown  . Diphenhydramine Hcl     REACTION: unspecified  . Metoclopramide     Other reaction(s): Unknown  . Metoclopramide Hcl     REACTION: unspecified  . Paroxetine     REACTION: hallucinations  . Tape Other (See Comments) and Rash     Current Outpatient Prescriptions on File Prior to Visit  Medication Sig Dispense Refill  . albuterol (PROVENTIL HFA;VENTOLIN HFA) 108 (90 BASE) MCG/ACT inhaler Inhale 2 puffs into the lungs every 6  (six) hours as needed for wheezing or shortness of breath. 1 Inhaler 6  . ALPRAZolam (XANAX) 1 MG tablet TAKE 1/2 TABLET IN THE MORNING TAKE 1/2 TABLET AT LUNCH AND TAKE 1 TABLET AT BEDTIME IF NEEDED 60 tablet 3  . amLODipine (NORVASC) 5 MG tablet Take 1 tablet (5 mg total) by mouth daily. 30 tablet 6  . aspirin 81 MG tablet Take 81 mg by mouth daily.      . busPIRone (BUSPAR) 10 MG tablet TAKE ONE TABLET TWICE DAILY 180 tablet 1  . Carbidopa-Levodopa ER (SINEMET CR) 25-100 MG tablet controlled release TAKE ONE (1) TABLET BY MOUTH 3 TIMES DAILY 270 tablet 1  . clopidogrel (PLAVIX) 75 MG tablet TAKE ONE TABLET EVERY DAY WITH BREAKFAST 30 tablet 3  . fluticasone (FLONASE) 50 MCG/ACT nasal spray Place 2 sprays into both nostrils daily. 16 g 5  . furosemide (LASIX) 40 MG tablet Take 1 tablet (40 mg total) by mouth daily. 30 tablet 0  . gabapentin (NEURONTIN) 100 MG capsule Take 1 capsule (100 mg total) by mouth 3 (three) times daily. 90 capsule 3  . gentamicin ointment (GARAMYCIN) 0.1 % Apply 1 application topically 3 (three) times daily. 30 g 0  . HYDROcodone-acetaminophen (NORCO/VICODIN) 5-325 MG per tablet Take 1-2 tablets by mouth 3 (three) times daily as needed for moderate pain. 120 tablet 0  . insulin glargine (LANTUS) 100 UNIT/ML injection Inject 0.35 mLs (35 Units total) into the skin at bedtime. 10 mL 6  . insulin lispro (HUMALOG) 100 UNIT/ML injection Inject 0.08-0.18 mLs (8-18 Units total) into the skin 3 (  three) times daily before meals. 10 mL 6  . Insulin Syringe-Needle U-100 31G X 5/16" 0.5 ML MISC Use as directed 100 each 1  . levothyroxine (SYNTHROID, LEVOTHROID) 75 MCG tablet Take 1 tablet (75 mcg total) by mouth daily. 30 tablet 5  . lisinopril (PRINIVIL,ZESTRIL) 20 MG tablet TAKE ONE TABLET TWICE DAILY 180 tablet 1  . metoprolol succinate (TOPROL-XL) 100 MG 24 hr tablet Take 1 tablet (100 mg total) by mouth daily. Take with or immediately following a meal. 90 tablet 3  . Multiple  Vitamins-Minerals (CENTRUM SILVER PO) Take 1 tablet by mouth daily.      Marland Kitchen omeprazole (PRILOSEC) 20 MG capsule TAKE ONE CAPSULE BY MOUTH DAILY 30 capsule 3  . ondansetron (ZOFRAN) 4 MG tablet Take 1 tablet (4 mg total) by mouth every 8 (eight) hours as needed. 90 tablet 11  . pravastatin (PRAVACHOL) 20 MG tablet TAKE ONE (1) TABLET EACH DAY 90 tablet 2  . rOPINIRole (REQUIP) 0.25 MG tablet Take 0.25 mg by mouth at bedtime.    . triamcinolone cream (KENALOG) 0.1 % Apply topically 2 (two) times daily. 30 g 0  . ZENPEP 5000 UNITS CPEP TAKE 3 CAPSULES BY MOUTH 3 TIMES DAILY AS DIRECTED 800 capsule 1   No current facility-administered medications on file prior to visit.     Past Medical History  Diagnosis Date  . Anxiety   . CHF (congestive heart failure)   . Depression   . Diabetes mellitus     Followed by Dr. Tedd Sias at [facility]  . GERD (gastroesophageal reflux disease)   . Hypertension   . Hypothyroidism   . Osteoporosis   . Allergy   . Hyperlipidemia   . RLS (restless legs syndrome)   . Pancreatic disease     pancreatic failure  . Urinary incontinence   . Pancreatitis     Pancreatic resection/ open wound- [facility] 11/04  . Pneumonia     Pneumonia/ Emphysema 05/05  . Diplopia     Diplopia- ? 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 History of pleural empyema   . Sepsis   . Frequent falls   . Arthritis     left knee, [facility]  . Empyema lung      Past Surgical History  Procedure Laterality Date  . Breast lumpectomy      Lumpectomy right breast  . 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 Abdominal hysterectomy      Hysteroscopy/ D & C ([place]) 12/05  . Removal of pancreas  2004  . Kyphoplasty  2005  . Broken shoulder and orbital bone  2005  . Cholecystectomy  1973  . Thoracentesis    . Vaginal delivery      9  . Cardiac catheterization  08/29/2013    x1 stent @ [facility]  .  Cataract surgery       Family History  Problem Relation Age of Onset  . Heart disease Mother   . Heart attack Mother   . Hypertension Mother   . Heart attack Father      History   Social History  . Marital Status: Widowed    Spouse Name: N/A    Number of Children: 7  . Years of Education: 6   Occupational History  . retired- Veterinary surgeon    Social History Main Topics  . Smoking status: Never Smoker   .  Smokeless tobacco: Never Used  . Alcohol Use: No  . Drug Use: No  . Sexual Activity: Not on file   Other Topics Concern  . 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 Has 7 children.            Has Kendal HymenBonnie, daughter, as health care POA   Not sure about DNR   Permission to speak to daughter-in-law, Georgian Congela Fambrough, regarding health care matters.     ROS A 10 point review of system was performed. It is negative other than that mentioned in the history of present illness.   PHYSICAL EXAM   BP 174/90 mmHg  Pulse 64  Ht 4\' 11"  (1.499 m)  Wt 180 lb 8 oz (81.874 kg)  BMI 36.44 kg/m2 Constitutional: She is oriented to person, place, and time. She appears well-developed and well-nourished. No distress.  HENT: No nasal discharge.  Head: Normocephalic and atraumatic.  Eyes: Pupils are equal and round. No discharge.  Neck: Normal range of motion. Neck supple. No JVD present. No thyromegaly present.  Cardiovascular: Normal rate, regular rhythm, normal heart sounds. Exam reveals no gallop and no friction rub. No murmur heard.  Pulmonary/Chest: Effort normal and breath sounds normal. No stridor. No respiratory distress. She has no wheezes. She has no rales. She exhibits no tenderness.  Abdominal: Soft. Bowel sounds are normal. She exhibits no distension. There is no tenderness. There is no rebound and no guarding.  Musculoskeletal: Normal range of motion. She exhibits no edema and no tenderness.    Neurological: She is alert and oriented to person, place, and time. Coordination normal.  Skin: Skin is warm and dry. No rash noted. She is not diaphoretic. No erythema. No pallor.  Psychiatric: She has a normal mood and affect. Her behavior is normal. Judgment and thought content normal.   AVW:UJWJXEKG:Sinus  Rhythm  -First degree A-V block  PRi = 248 Low voltage in precordial leads.   -Poor R-wave progression -may be secondary to pulmonary disease   consider old anterior infarct.   -  Nonspecific T-abnormality.     ASSESSMENT AND PLAN

## 2014-07-25 NOTE — Assessment & Plan Note (Signed)
Blood pressure is elevated but this has not been the trend lately. Continue to monitor.

## 2014-07-25 NOTE — Assessment & Plan Note (Signed)
Lab Results  Component Value Date   CHOL 165 12/26/2013   HDL 40.30 12/26/2013   LDLCALC 60 12/26/2013   LDLDIRECT 80.7 10/11/2012   TRIG 323.0* 12/26/2013   CHOLHDL 4 12/26/2013   Continue treatment with pravastatin.

## 2014-07-29 LAB — URINALYSIS, COMPLETE
Bilirubin,UR: NEGATIVE
Blood: NEGATIVE
GLUCOSE, UR: NEGATIVE mg/dL (ref 0–75)
Hyaline Cast: 8
KETONE: NEGATIVE
NITRITE: NEGATIVE
PH: 7 (ref 4.5–8.0)
Protein: NEGATIVE
RBC,UR: 2 /HPF (ref 0–5)
Specific Gravity: 1.008 (ref 1.003–1.030)
Squamous Epithelial: <1
WBC UR: 7 /HPF (ref 0–5)

## 2014-07-29 LAB — CBC
HCT: 37.4 % (ref 35.0–47.0)
HGB: 12 g/dL (ref 12.0–16.0)
MCH: 30.6 pg (ref 26.0–34.0)
MCHC: 32 g/dL (ref 32.0–36.0)
MCV: 96 fL (ref 80–100)
Platelet: 91 10*3/uL — ABNORMAL LOW (ref 150–440)
RBC: 3.91 10*6/uL (ref 3.80–5.20)
RDW: 13.6 % (ref 11.5–14.5)
WBC: 5.5 10*3/uL (ref 3.6–11.0)

## 2014-07-29 LAB — COMPREHENSIVE METABOLIC PANEL
Albumin: 3.2 g/dL — ABNORMAL LOW (ref 3.4–5.0)
Alkaline Phosphatase: 92 U/L
Anion Gap: 5 — ABNORMAL LOW (ref 7–16)
BUN: 19 mg/dL — ABNORMAL HIGH (ref 7–18)
Bilirubin,Total: 0.4 mg/dL (ref 0.2–1.0)
CO2: 30 mmol/L (ref 21–32)
Calcium, Total: 8.7 mg/dL (ref 8.5–10.1)
Chloride: 107 mmol/L (ref 98–107)
Creatinine: 1.19 mg/dL (ref 0.60–1.30)
EGFR (African American): 56 — ABNORMAL LOW
EGFR (Non-African Amer.): 46 — ABNORMAL LOW
GLUCOSE: 182 mg/dL — AB (ref 65–99)
OSMOLALITY: 290 (ref 275–301)
POTASSIUM: 4.2 mmol/L (ref 3.5–5.1)
SGOT(AST): 14 U/L — ABNORMAL LOW (ref 15–37)
SGPT (ALT): 15 U/L
Sodium: 142 mmol/L (ref 136–145)
Total Protein: 6.7 g/dL (ref 6.4–8.2)

## 2014-07-29 LAB — TROPONIN I: Troponin-I: <0.02 ng/mL

## 2014-07-29 LAB — HEMOGLOBIN A1C: Hemoglobin A1C: 7.7 % — ABNORMAL HIGH (ref 4.2–6.3)

## 2014-07-30 ENCOUNTER — Inpatient Hospital Stay: Payer: Self-pay | Admitting: Internal Medicine

## 2014-07-30 DIAGNOSIS — G459 Transient cerebral ischemic attack, unspecified: Secondary | ICD-10-CM

## 2014-07-30 LAB — CBC WITH DIFFERENTIAL/PLATELET
BASOS ABS: 0 10*3/uL (ref 0.0–0.1)
BASOS PCT: 0.3 %
EOS PCT: 3.3 %
Eosinophil #: 0.1 10*3/uL (ref 0.0–0.7)
HCT: 36 % (ref 35.0–47.0)
HGB: 11.7 g/dL — ABNORMAL LOW (ref 12.0–16.0)
Lymphocyte #: 1.2 10*3/uL (ref 1.0–3.6)
Lymphocyte %: 30.4 %
MCH: 30.8 pg (ref 26.0–34.0)
MCHC: 32.7 g/dL (ref 32.0–36.0)
MCV: 94 fL (ref 80–100)
Monocyte #: 0.4 x10 3/mm (ref 0.2–0.9)
Monocyte %: 9.9 %
Neutrophil #: 2.3 10*3/uL (ref 1.4–6.5)
Neutrophil %: 56.1 %
PLATELETS: 80 10*3/uL — AB (ref 150–440)
RBC: 3.81 10*6/uL (ref 3.80–5.20)
RDW: 13.9 % (ref 11.5–14.5)
WBC: 4 10*3/uL (ref 3.6–11.0)

## 2014-07-30 LAB — COMPREHENSIVE METABOLIC PANEL
ALBUMIN: 3 g/dL — AB (ref 3.4–5.0)
ALK PHOS: 87 U/L
ANION GAP: 3 — AB (ref 7–16)
AST: 19 U/L (ref 15–37)
BUN: 19 mg/dL — ABNORMAL HIGH (ref 7–18)
Bilirubin,Total: 0.4 mg/dL (ref 0.2–1.0)
CALCIUM: 8.1 mg/dL — AB (ref 8.5–10.1)
CHLORIDE: 104 mmol/L (ref 98–107)
Co2: 31 mmol/L (ref 21–32)
Creatinine: 1.09 mg/dL (ref 0.60–1.30)
EGFR (African American): >60
EGFR (Non-African Amer.): 51 — ABNORMAL LOW
Glucose: 226 mg/dL — ABNORMAL HIGH (ref 65–99)
OSMOLALITY: 285 (ref 275–301)
Potassium: 3.8 mmol/L (ref 3.5–5.1)
SGPT (ALT): 10 U/L — ABNORMAL LOW
SODIUM: 138 mmol/L (ref 136–145)
TOTAL PROTEIN: 6.2 g/dL — AB (ref 6.4–8.2)

## 2014-07-30 LAB — LIPID PANEL
Cholesterol: 180 mg/dL (ref 0–200)
HDL Cholesterol: 34 mg/dL — ABNORMAL LOW (ref 40–60)
Triglycerides: 462 mg/dL — ABNORMAL HIGH (ref 0–200)

## 2014-07-31 ENCOUNTER — Telehealth: Payer: Self-pay

## 2014-07-31 LAB — URINE CULTURE

## 2014-07-31 LAB — PLATELET COUNT: Platelet: 100 10*3/uL — ABNORMAL LOW (ref 150–440)

## 2014-07-31 NOTE — Telephone Encounter (Signed)
Please see below.

## 2014-07-31 NOTE — Telephone Encounter (Signed)
The pt's daughter called and is hoping to get a refill on the pt's pain medication.   Callback - [phone]

## 2014-07-31 NOTE — Telephone Encounter (Signed)
Pt is currently in the hospital.  Left message for pt to return my call. Per Dr Dan HumphreysWalker, can can fill once pt comes for hospital follow up appt

## 2014-07-31 NOTE — Telephone Encounter (Signed)
That is fine to refill Hydrocodone.

## 2014-08-01 ENCOUNTER — Other Ambulatory Visit: Payer: Self-pay | Admitting: Internal Medicine

## 2014-08-01 NOTE — Telephone Encounter (Signed)
Printer not working. Phoned to pharmacy

## 2014-08-01 NOTE — Telephone Encounter (Signed)
Ok refill? 

## 2014-08-04 ENCOUNTER — Telehealth: Payer: Self-pay | Admitting: *Deleted

## 2014-08-04 NOTE — Telephone Encounter (Signed)
Discharge date: 08/01/14  Transition Care Management Follow-up Telephone Call  How have you been since you were released from the hospital? Pt states "Okay, but still having left ear ringing, and left side of mouth numb"    Do you understand why you were in the hospital? YES   Do you understand the discharge instrcutions? YES  Items Reviewed:  Medications reviewed: YES  Allergies reviewed:NO  Dietary changes reviewed: NO  Referrals reviewed: Physical therapy and nurse aid   Functional Questionnaire:   Activities of Daily Living (ADLs):   She states they are independent in the following:  States they require assistance with the following:    Any transportation issues/concerns?: NO   Any patient concerns? NO   Confirmed importance and date/time of follow-up visits scheduled: YES, Thurs dec 17th at 9:00   Confirmed with patient if condition begins to worsen call PCP or go to the ER.  Patient was given the Call-a-Nurse line [phone]: YES

## 2014-08-05 ENCOUNTER — Ambulatory Visit: Payer: Medicare Other | Admitting: Internal Medicine

## 2014-08-05 ENCOUNTER — Other Ambulatory Visit: Payer: Self-pay | Admitting: Internal Medicine

## 2014-08-05 MED ORDER — HYDROCODONE-ACETAMINOPHEN 5-325 MG PO TABS: 1.0000 | ORAL_TABLET | Freq: Three times a day (TID) | ORAL | Status: DC | PRN

## 2014-08-05 NOTE — Addendum Note (Signed)
Addended by: Marchia MeiersEASTWOOD, Dredyn Gubbels M on: 08/05/2014 05:03 PM   Modules accepted: Orders

## 2014-08-05 NOTE — Telephone Encounter (Signed)
The patient's daughter is calling to request the printed rx of the pain medicine. She states the pt is out of the hospital.  Daughter's callback - [phone]

## 2014-08-05 NOTE — Telephone Encounter (Signed)
Fine to refill 

## 2014-08-07 ENCOUNTER — Ambulatory Visit (INDEPENDENT_AMBULATORY_CARE_PROVIDER_SITE_OTHER): Payer: Medicare Other | Admitting: Internal Medicine

## 2014-08-07 ENCOUNTER — Encounter: Payer: Self-pay | Admitting: Internal Medicine

## 2014-08-07 ENCOUNTER — Ambulatory Visit: Payer: Medicare Other | Admitting: Cardiovascular Disease

## 2014-08-07 VITALS — BP 120/75 | HR 64 | Temp 97.8°F | Ht 59.0 in | Wt 182.2 lb

## 2014-08-07 DIAGNOSIS — S43421D Sprain of right rotator cuff capsule, subsequent encounter: Secondary | ICD-10-CM

## 2014-08-07 DIAGNOSIS — M75102 Unspecified rotator cuff tear or rupture of left shoulder, not specified as traumatic: Secondary | ICD-10-CM

## 2014-08-07 DIAGNOSIS — E119 Type 2 diabetes mellitus without complications: Secondary | ICD-10-CM

## 2014-08-07 DIAGNOSIS — I639 Cerebral infarction, unspecified: Secondary | ICD-10-CM | POA: Insufficient documentation

## 2014-08-07 LAB — CBC WITH DIFFERENTIAL/PLATELET
BASOS ABS: 0 10*3/uL (ref 0.0–0.1)
BASOS PCT: 0.5 % (ref 0.0–3.0)
EOS PCT: 3.5 % (ref 0.0–5.0)
Eosinophils Absolute: 0.2 10*3/uL (ref 0.0–0.7)
HCT: 37.2 % (ref 36.0–46.0)
HEMOGLOBIN: 11.9 g/dL — AB (ref 12.0–15.0)
LYMPHS PCT: 21.9 % (ref 12.0–46.0)
Lymphs Abs: 1.2 10*3/uL (ref 0.7–4.0)
MCHC: 32.1 g/dL (ref 30.0–36.0)
MCV: 93.1 fl (ref 78.0–100.0)
Monocytes Absolute: 0.4 10*3/uL (ref 0.1–1.0)
Monocytes Relative: 7.6 % (ref 3.0–12.0)
NEUTROS ABS: 3.7 10*3/uL (ref 1.4–7.7)
Neutrophils Relative %: 66.5 % (ref 43.0–77.0)
Platelets: 113 10*3/uL — ABNORMAL LOW (ref 150.0–400.0)
RBC: 3.99 Mil/uL (ref 3.87–5.11)
RDW: 13.9 % (ref 11.5–15.5)
WBC: 5.6 10*3/uL (ref 4.0–10.5)

## 2014-08-07 LAB — COMPREHENSIVE METABOLIC PANEL
ALT: 10 U/L (ref 0–35)
AST: 16 U/L (ref 0–37)
Albumin: 3.8 g/dL (ref 3.5–5.2)
Alkaline Phosphatase: 96 U/L (ref 39–117)
BILIRUBIN TOTAL: 0.4 mg/dL (ref 0.2–1.2)
BUN: 29 mg/dL — ABNORMAL HIGH (ref 6–23)
CO2: 26 mEq/L (ref 19–32)
CREATININE: 1 mg/dL (ref 0.4–1.2)
Calcium: 9 mg/dL (ref 8.4–10.5)
Chloride: 106 mEq/L (ref 96–112)
GFR: 54.39 mL/min — ABNORMAL LOW (ref >60.00)
GLUCOSE: 156 mg/dL — AB (ref 70–99)
Potassium: 4.5 mEq/L (ref 3.5–5.1)
Sodium: 142 mEq/L (ref 135–145)
Total Protein: 6.4 g/dL (ref 6.0–8.3)

## 2014-08-07 LAB — HEMOGLOBIN A1C: HEMOGLOBIN A1C: 8.1 % — AB (ref 4.6–6.5)

## 2014-08-07 NOTE — Assessment & Plan Note (Signed)
MRI brain showed right parietal infarct. Continue Aspirin and Plavix. Will set up neurology follow up and cardiology follow up for possible LOOP monitor. Continue PT at home. Follow up here in 4 weeks.

## 2014-08-07 NOTE — Progress Notes (Signed)
Subjective:    Patient ID: [name], female    DOB: 01/31/1929, 80 y.o.   MRN: [id number]  HPI 80YO female presents for hospital follow up.  ADMITTED 07/29/2014 DISCHARGED 07/31/2014  DIAGNOSIS: CVA, Left Rotator Cuff Tear  Presented to ED with right sided weakness 07/29/2014. CT head showed no acute process. MRI brain showed small areas of infarct in the right parietal lobe. Pt was continued on Aspirin and Plavix. Planned for neurology follow up and cardiology follow up as outpatient.  Pt was also found to have left sided rotator cuff tear, with completed tear of supraspinatus tendon.  Continues to have right hand numbness and slight weakness. Left upper lip is numb with ringing in left ear. No change since discharge.  Continues to have weakness of abduction of left arm. Occasional mild pain in shoulder, but taking Hydrocodone (for chronic back pain) which alleviates this.  No new concerns today. Compliant with medications.   Past medical, surgical, family and social history per today's encounter.  Review of Systems  Constitutional: Negative for fever, chills, appetite change, fatigue and unexpected weight change.  Eyes: Negative for visual disturbance.  Respiratory: Positive for shortness of breath (with exertion).   Cardiovascular: Negative for chest pain, palpitations and leg swelling.  Gastrointestinal: Negative for nausea, vomiting, abdominal pain, diarrhea, constipation and blood in stool.  Musculoskeletal: Positive for myalgias, back pain and arthralgias.  Skin: Negative for color change and rash.  Neurological: Positive for weakness and numbness.  Hematological: Negative for adenopathy. Does not bruise/bleed easily.  Psychiatric/Behavioral: Negative for dysphoric mood. The patient is not nervous/anxious.        Objective:    BP 120/75 mmHg  Pulse 64  Temp(Src) 97.8 F (36.6 C) (Oral)  Ht 4\' 11"  (1.499 m)  Wt 182 lb 4 oz (82.668 kg)  BMI 36.79 kg/m2   SpO2 95% Physical Exam  Constitutional: She is oriented to person, place, and time. She appears well-developed and well-nourished. No distress.  HENT:  Head: Normocephalic and atraumatic.  Right Ear: External ear normal.  Left Ear: External ear normal.  Nose: Nose normal.  Mouth/Throat: Oropharynx is clear and moist. No oropharyngeal exudate.  Eyes: Conjunctivae are normal. Pupils are equal, round, and reactive to light. Right eye exhibits no discharge. Left eye exhibits no discharge. No scleral icterus.  Neck: Normal range of motion. Neck supple. No tracheal deviation present. No thyromegaly present.  Cardiovascular: Normal rate, regular rhythm, normal heart sounds and intact distal pulses.  Exam reveals no gallop and no friction rub.   No murmur heard. Pulmonary/Chest: Effort normal and breath sounds normal. No accessory muscle usage. No tachypnea. No respiratory distress. She has no decreased breath sounds. She has no wheezes. She has no rhonchi. She has no rales. She exhibits no tenderness.  Musculoskeletal: She exhibits no edema or tenderness.       Left shoulder: She exhibits decreased range of motion and decreased strength (abduction). She exhibits no tenderness, no bony tenderness and no pain.  Lymphadenopathy:    She has no cervical adenopathy.  Neurological: She is alert and oriented to person, place, and time. No cranial nerve deficit. She exhibits normal muscle tone. Coordination normal.  Skin: Skin is warm and dry. No rash noted. She is not diaphoretic. No erythema. No pallor.  Psychiatric: She has a normal mood and affect. Her behavior is normal. Judgment and thought content normal.          Assessment & Plan:   Problem  List Items Addressed This Visit      High   Diabetes type 2, controlled    Recheck A1c with labs today.    Relevant Medications      pravastatin (PRAVACHOL) 40 MG tablet   Other Relevant Orders      Hemoglobin A1c     Unprioritized   CVA  (cerebral infarction) - Primary    MRI brain showed right parietal infarct. Continue Aspirin and Plavix. Will set up neurology follow up and cardiology follow up for possible LOOP monitor. Continue PT at home. Follow up here in 4 weeks.    Relevant Orders      Ambulatory referral to Neurology      Ambulatory referral to Cardiology      Comprehensive metabolic panel      CBC with Differential   Left rotator cuff tear    Complete tear of left supraspinatous tendon on MRI. Will set up evaluation with ortho. Continue PT at home.        Return in about 4 weeks (around 09/04/2014) for Recheck.

## 2014-08-07 NOTE — Patient Instructions (Signed)
Labs today.  We will set up evaluation with neurology, cardiology, and orthopedics.  Follow up here in 4 weeks or sooner as needed.

## 2014-08-07 NOTE — Assessment & Plan Note (Signed)
Complete tear of left supraspinatous tendon on MRI. Will set up evaluation with ortho. Continue PT at home.

## 2014-08-07 NOTE — Assessment & Plan Note (Signed)
Recheck A1c with labs today. 

## 2014-08-07 NOTE — Progress Notes (Signed)
Pre visit review using our clinic review tool, if applicable. No additional management support is needed unless otherwise documented below in the visit note. 

## 2014-08-08 ENCOUNTER — Encounter: Payer: Self-pay | Admitting: Internal Medicine

## 2014-08-11 ENCOUNTER — Ambulatory Visit (INDEPENDENT_AMBULATORY_CARE_PROVIDER_SITE_OTHER): Payer: Medicare Other | Admitting: Internal Medicine

## 2014-08-11 ENCOUNTER — Telehealth: Payer: Self-pay | Admitting: *Deleted

## 2014-08-11 ENCOUNTER — Encounter: Payer: Self-pay | Admitting: Internal Medicine

## 2014-08-11 VITALS — BP 128/76 | HR 86 | Ht 59.0 in | Wt 182.0 lb

## 2014-08-11 DIAGNOSIS — I639 Cerebral infarction, unspecified: Secondary | ICD-10-CM

## 2014-08-11 DIAGNOSIS — I2510 Atherosclerotic heart disease of native coronary artery without angina pectoris: Secondary | ICD-10-CM

## 2014-08-11 NOTE — Telephone Encounter (Signed)
requested records for [facility] from [facility] in [place] office, she will fax notes..Marland Kitchen

## 2014-08-11 NOTE — Patient Instructions (Signed)
Your physician recommends that you continue on your current medications as directed. Please refer to the Current Medication list given to you today.  We [facility]

## 2014-08-11 NOTE — Progress Notes (Signed)
ELECTROPHYSIOLOGY CONSULT NOTE  Patient ID: [name], MRN: [id number], DOB/AGE: March 25, 1929 80 y.o. Admit date: (Not on file) Date of Consult: 08/11/2014  Primary Physician: Wynona Dove, MD Primary Cardiologist: MA  Chief Complaint: question LINQ for cryptogenic stroke    HPI [name] is a 80 y.o. female  Referred consideration for loop recorder insertion for cryptogenic stroke  This occurred early 12/15 when she developed transient L facial droop paresthesias and L arm weakness  It resolved over about 2 hrs and then recurred the following day prompting admission  Symptoms apparently improved but chart is replete with comments about RIGHT hand weakness, but she ascribes this a fall that occurred earlier in the year and was distinct from the stroke   At tjat time she was also found to have a UTI and torn L rotator cuff  Evaluation included an echocardiogram 12/15 demonstrated normal LV function and normal atrial size.  CT scan had confirmed ischemic stroke without evidence of bleeding. Carotid Dopplers were unremarkable. ECG demonstrated sinus rhythm with first degree AV block and other laboratories were unrevealing.  She ahs chronic DOE and mild edema She has CAD and underwent stenting 1/15 of OM2 in the setting of total occlusion of mid LAD and normal LV function      Past Medical History  Diagnosis Date  . Anxiety   . CHF (congestive heart failure)   . Depression   . Diabetes mellitus     Followed by Dr. Tedd Sias at [facility]  . GERD (gastroesophageal reflux disease)   . Hypertension   . Hypothyroidism   . Osteoporosis   . Allergy   . Hyperlipidemia   . RLS (restless legs syndrome)   . Pancreatic disease     pancreatic failure  . Urinary incontinence   . Pancreatitis     Pancreatic resection/ open wound- [facility] 11/04  . Pneumonia     Pneumonia/ Emphysema 05/05  . 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 TIA  (transient ischemic attack)     recurrent 12/15  . History of pleural empyema   . Sepsis   . Frequent falls   . Arthritis     left knee, [facility]  . CAD (coronary artery disease), native coronary artery      1//15  T mid-LAD  stent OM2  . Parkinson's disease       Surgical History:  Past Surgical History  Procedure Laterality Date  . Breast lumpectomy      Lumpectomy right breast  . 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 Abdominal hysterectomy      Hysteroscopy/ D & C ([facility]) 12/05  . Removal of pancreas  2004  . Kyphoplasty  2005  . Broken shoulder and orbital bone  2005  . Cholecystectomy  1973  . Thoracentesis    . Vaginal delivery      7  . Cardiac catheterization  08/29/2013    x1 stent @ [facility]  . Cataract surgery       Home Meds: Prior to Admission medications   Medication Sig Start Date End Date Taking? Authorizing Provider  albuterol (PROVENTIL HFA;VENTOLIN HFA) 108 (90 BASE) MCG/ACT inhaler Inhale 2 puffs into the lungs every 6 (six) hours as needed for wheezing or shortness of breath. 09/12/13  Yes Shelia Media, MD  ALPRAZolam Prudy Feeler) 1 MG tablet TAKE 1/2 TABLET IN THE MORNING  TAKE 1/2 TABLET AT LUNCH AND TAKE 1 TABLET AT BEDTIME IF NEEDED 08/01/14  Yes Shelia MediaJennifer A Walker, MD  amLODipine (NORVASC) 5 MG tablet Take 1 tablet (5 mg total) by mouth daily. 04/23/14  Yes [place] OuchMuhammad A Arida, MD  aspirin 81 MG tablet Take 81 mg by mouth daily.     Yes Historical Provider, MD  busPIRone (BUSPAR) 10 MG tablet TAKE ONE TABLET TWICE DAILY 06/09/14  Yes Shelia MediaJennifer A Walker, MD  Carbidopa-Levodopa ER (SINEMET CR) 25-100 MG tablet controlled release TAKE ONE (1) TABLET BY MOUTH 3 TIMES DAILY 06/09/14  Yes Shelia MediaJennifer A Walker, MD  ciprofloxacin (CIPRO) 250 MG tablet  07/31/14  Yes Historical Provider, MD  clopidogrel (PLAVIX) 75 MG tablet TAKE ONE TABLET EVERY DAY WITH BREAKFAST 04/23/14  Yes Shelia MediaJennifer A Walker, MD  fluticasone (FLONASE)  50 MCG/ACT nasal spray Place 2 sprays into both nostrils daily. 12/23/13  Yes Shelia MediaJennifer A Walker, MD  furosemide (LASIX) 40 MG tablet Take 1 tablet (40 mg total) by mouth daily. 12/23/13  Yes Iran OuchMuhammad A Arida, MD  gabapentin (NEURONTIN) 100 MG capsule Take 1 capsule (100 mg total) by mouth 3 (three) times daily. 03/27/14  Yes Shelia MediaJennifer A Walker, MD  gentamicin ointment (GARAMYCIN) 0.1 % Apply 1 application topically 3 (three) times daily. 02/03/14  Yes Shelia MediaJennifer A Walker, MD  HYDROcodone-acetaminophen (NORCO/VICODIN) 5-325 MG per tablet Take 1-2 tablets by mouth 3 (three) times daily as needed for moderate pain. 08/05/14  Yes Shelia MediaJennifer A Walker, MD  insulin glargine (LANTUS) 100 UNIT/ML injection Inject 0.35 mLs (35 Units total) into the skin at bedtime. 03/27/14  Yes Shelia MediaJennifer A Walker, MD  insulin lispro (HUMALOG) 100 UNIT/ML injection Inject 0.08-0.18 mLs (8-18 Units total) into the skin 3 (three) times daily before meals. 03/27/14  Yes Shelia MediaJennifer A Walker, MD  levothyroxine (SYNTHROID, LEVOTHROID) 75 MCG tablet TAKE ONE (1) TABLET EACH DAY 08/01/14  Yes Shelia MediaJennifer A Walker, MD  lisinopril (PRINIVIL,ZESTRIL) 20 MG tablet TAKE ONE TABLET TWICE DAILY 08/01/14  Yes Shelia MediaJennifer A Walker, MD  metoprolol succinate (TOPROL-XL) 100 MG 24 hr tablet Take 1 tablet (100 mg total) by mouth daily. Take with or immediately following a meal. 08/13/13  Yes Iran OuchMuhammad A Arida, MD  Multiple Vitamins-Minerals (CENTRUM SILVER PO) Take 1 tablet by mouth daily.     Yes Historical Provider, MD  omeprazole (PRILOSEC) 20 MG capsule TAKE ONE CAPSULE BY MOUTH DAILY 06/11/14  Yes Shelia MediaJennifer A Walker, MD  ondansetron (ZOFRAN) 4 MG tablet Take 1 tablet (4 mg total) by mouth every 8 (eight) hours as needed. 01/18/13  Yes Shelia MediaJennifer A Walker, MD  pravastatin (PRAVACHOL) 40 MG tablet  07/31/14  Yes Historical Provider, MD  rOPINIRole (REQUIP) 0.25 MG tablet Take 0.25 mg by mouth at bedtime.   Yes Historical Provider, MD  triamcinolone cream (KENALOG) 0.1 %  Apply topically 2 (two) times daily. 10/11/12  Yes Shelia MediaJennifer A Walker, MD  ULTICARE INSULIN SYRINGE 31G X 5/16" 0.5 ML MISC USE AS DIRECTED 08/05/14  Yes Shelia MediaJennifer A Walker, MD  ZENPEP 5000 UNITS CPEP TAKE 3 CAPSULES BY MOUTH 3 TIMES DAILY AS DIRECTED 06/23/14  Yes Shelia MediaJennifer A Walker, MD     Allergies:  Allergies  Allergen Reactions  . Citalopram Hydrobromide     REACTION: nausea, dizzy, dry mouth  . Diphenhydramine     Other reaction(s): Unknown  . Diphenhydramine Hcl     REACTION: unspecified  . Metoclopramide     Other reaction(s): Unknown  . Metoclopramide Hcl  REACTION: unspecified  . Paroxetine     REACTION: hallucinations  . Tape Other (See Comments) and Rash    History   Social History  . Marital Status: Widowed    Spouse Name: N/A    Number of Children: 7  . Years of Education: 6   Occupational History  . retired- Veterinary surgeon    Social History Main Topics  . Smoking status: Never Smoker   . Smokeless tobacco: Never Used  . Alcohol Use: No  . Drug Use: No  . Sexual Activity: Not on file   Other Topics Concern  . Not on file   Social History Narrative   Lives in home alone, has nursing aid, and alert device in [place]. Has 7 children.            Has [name], daughter, as health care POA   Not sure about DNR   Permission to speak to daughter-in-law, Ismahan Lippman, regarding health care matters.     Family History  Problem Relation Age of Onset  . Heart disease Mother   . Heart attack Mother   . Hypertension Mother   . Heart attack Father      ROS:  Please see the history of present illness.   Negative x fatigue   All other systems reviewed and negative.    Physical Exam   Blood pressure 128/76, pulse 86, height 4\' 11"  (1.499 m), weight 182 lb (82.555 kg). General: Well developed, well nourished female in no acute distress. Head: Normocephalic, atraumatic, sclera non-icteric, no xanthomas, nares are without discharge. EENT: normal Lymph  Nodes:  none Back: without scoliosis/kyphosis  no CVA tendersness Neck: Negative for carotid bruits. JVD not elevated. Lungs: Clear bilaterally to auscultation without wheezes, rales, or rhonchi. Breathing is unlabored. Heart: RRR with S1 S2. No  murmur , rubs, or gallops appreciated. Abdomen: Soft, non-tender, non-distended with normoactive bowel sounds. No hepatomegaly. No rebound/guarding. No obvious abdominal masses. Msk:  Strength and tone appear normal for age. Extremities: No clubbing or cyanosis. 1+ edema.  Distal pedal pulses are 2+ and equal bilaterally. Skin: Warm and Dry; SUPERFICIAL venous channels on Right Chest Neuro: Alert and oriented X 3. CN III-XII intact Grossly normal sensory and motor function . Psych:  Responds to questions appropriately with a normal affect.      Labs: Cardiac Enzymes No results for input(s): CKTOTAL, CKMB, TROPONINI in the last 72 hours. CBC Lab Results  Component Value Date   WBC 5.6 08/07/2014   HGB 11.9* 08/07/2014   HCT 37.2 08/07/2014   MCV 93.1 08/07/2014   PLT 113.0* 08/07/2014   PROTIME: No results for input(s): LABPROT, INR in the last 72 hours. Chemistry   Recent Labs Lab 08/07/14 0922  NA 142  K 4.5  CL 106  CO2 26  BUN 29*  CREATININE 1.0  CALCIUM 9.0  PROT 6.4  BILITOT 0.4  ALKPHOS 96  ALT 10  AST 16  GLUCOSE 156*   Lipids Lab Results  Component Value Date   CHOL 165 12/26/2013   HDL 40.30 12/26/2013   LDLCALC 60 12/26/2013   TRIG 323.0* 12/26/2013    EKG: sinus 64 27/08/42 O/w normal   Assessment and Plan:   Cryptogenic Stroke  CAD  With prior Stenting  R venous collateralization    She had an ischemic stroke;  It is reasonable to consider LINQ insertion to look for occult AFib based on the CRYSTAL-AF trial   We have reviewed the potential benefits and  risks  This would prompt a change in her antiplatlet regime to include anticoagulation  In the event that she were to need venous procedures  like pacing it would be prudent to investigate the cause of the supreficial collateralization suggesting venous obstruction   Sherryl MangesSteven Klein

## 2014-08-11 NOTE — Telephone Encounter (Signed)
Records received from marina in [place].Marland Kitchen..Marland Kitchen

## 2014-08-13 NOTE — Telephone Encounter (Signed)
Linq recorder scheduled 09/09/14 at 0730 Sherri Price aware and will inform patient

## 2014-08-14 ENCOUNTER — Institutional Professional Consult (permissible substitution): Payer: Medicare Other | Admitting: Internal Medicine

## 2014-08-19 ENCOUNTER — Other Ambulatory Visit: Payer: Self-pay

## 2014-08-19 MED ORDER — CLOPIDOGREL BISULFATE 75 MG PO TABS: ORAL_TABLET | ORAL | Status: DC

## 2014-08-22 ENCOUNTER — Encounter: Payer: Self-pay | Admitting: Surgery

## 2014-08-26 ENCOUNTER — Encounter: Payer: Self-pay | Admitting: *Deleted

## 2014-08-26 ENCOUNTER — Telehealth: Payer: Self-pay | Admitting: *Deleted

## 2014-08-26 NOTE — Telephone Encounter (Signed)
Scheduled ILR implant at [facility], with Dr. Graciela HusbandsKlein, for 09/09/14. Patient to be at hospital that morning at 6:30 am Reviewed instructions with patient and [facility]. Wound check scheduled for 09/16/14 in [place] office. Patient verbalized understanding and agreeable to plan.

## 2014-08-27 ENCOUNTER — Telehealth: Payer: Self-pay | Admitting: *Deleted

## 2014-08-27 NOTE — Telephone Encounter (Signed)
[name]  Called [facility] back to report HA1C as 8.1 from 12.17.15 lab draw.

## 2014-08-28 ENCOUNTER — Telehealth: Payer: Self-pay | Admitting: *Deleted

## 2014-08-28 ENCOUNTER — Encounter: Payer: Self-pay | Admitting: Cardiovascular Disease

## 2014-08-28 ENCOUNTER — Encounter: Payer: Self-pay | Admitting: *Deleted

## 2014-08-28 ENCOUNTER — Ambulatory Visit (INDEPENDENT_AMBULATORY_CARE_PROVIDER_SITE_OTHER): Payer: Medicare Other | Admitting: Cardiovascular Disease

## 2014-08-28 VITALS — BP 140/64 | HR 56 | Ht 59.0 in | Wt 182.5 lb

## 2014-08-28 DIAGNOSIS — I2510 Atherosclerotic heart disease of native coronary artery without angina pectoris: Secondary | ICD-10-CM

## 2014-08-28 DIAGNOSIS — I6389 Other cerebral infarction: Secondary | ICD-10-CM

## 2014-08-28 DIAGNOSIS — I638 Other cerebral infarction: Secondary | ICD-10-CM

## 2014-08-28 DIAGNOSIS — I10 Essential (primary) hypertension: Secondary | ICD-10-CM

## 2014-08-28 DIAGNOSIS — I5032 Chronic diastolic (congestive) heart failure: Secondary | ICD-10-CM

## 2014-08-28 NOTE — Patient Instructions (Signed)
Your physician recommends that you continue on your current medications as directed. Please refer to the Current Medication list given to you today.  Your physician recommends that you schedule a follow-up appointment in: 3 months  

## 2014-08-28 NOTE — Progress Notes (Signed)
Primary care physician: Dr. Ronna Polio   HPI  This is an 81 year old female who is here today for a followup visit regarding coronary artery disease and chronic diastolic heart failure. She has known history of  DM, refractory HTN, pancreatic insufficiency, and osteoarthritis .  She was admitted in January, 2015  with worsening dyspnea. She was noted to have right lower lobe pneumonia. She was treated with antibiotics. BNP was mildly elevated. She underwent a pharmacologic nuclear stress test which showed evidence of anterior wall ischemia with normal ejection fraction. I proceeded with cardiac catheterization which showed chronically occluded mid LAD with right-to-left collaterals. There was high-grade stenosis in a large OM 2 extending to the apex. This was treated with angioplasty and drug-eluting stent placement.  She was hospitalized on December 8 with stroke symptoms. MRI showed small area of infarct in the right parietal lobe. Echocardiogram was unremarkable. She was referred to Dr. Graciela Husbands for placement of loop recorder. This is scheduled for January 19. She has been doing reasonably well and denies any chest pain.    Allergies  Allergen Reactions  . Citalopram Hydrobromide     REACTION: nausea, dizzy, dry mouth  . Diphenhydramine     Other reaction(s): Unknown  . Diphenhydramine Hcl     REACTION: unspecified  . Metoclopramide     Other reaction(s): Unknown  . Metoclopramide Hcl     REACTION: unspecified  . Paroxetine     REACTION: hallucinations  . Tape Other (See Comments) and Rash     Current Outpatient Prescriptions on File Prior to Visit  Medication Sig Dispense Refill  . albuterol (PROVENTIL HFA;VENTOLIN HFA) 108 (90 BASE) MCG/ACT inhaler Inhale 2 puffs into the lungs every 6 (six) hours as needed for wheezing or shortness of breath. 1 Inhaler 6  . ALPRAZolam (XANAX) 1 MG tablet TAKE 1/2 TABLET IN THE MORNING TAKE 1/2 TABLET AT LUNCH AND TAKE 1 TABLET AT BEDTIME  IF NEEDED 60 tablet 3  . amLODipine (NORVASC) 5 MG tablet Take 1 tablet (5 mg total) by mouth daily. 30 tablet 6  . aspirin 81 MG tablet Take 81 mg by mouth daily.      . busPIRone (BUSPAR) 10 MG tablet TAKE ONE TABLET TWICE DAILY 180 tablet 1  . Carbidopa-Levodopa ER (SINEMET CR) 25-100 MG tablet controlled release TAKE ONE (1) TABLET BY MOUTH 3 TIMES DAILY 270 tablet 1  . clopidogrel (PLAVIX) 75 MG tablet TAKE ONE TABLET EVERY DAY WITH BREAKFAST 30 tablet 3  . fluticasone (FLONASE) 50 MCG/ACT nasal spray Place 2 sprays into both nostrils daily. 16 g 5  . furosemide (LASIX) 40 MG tablet Take 1 tablet (40 mg total) by mouth daily. 30 tablet 0  . gabapentin (NEURONTIN) 100 MG capsule Take 1 capsule (100 mg total) by mouth 3 (three) times daily. 90 capsule 3  . gentamicin ointment (GARAMYCIN) 0.1 % Apply 1 application topically 3 (three) times daily. 30 g 0  . HYDROcodone-acetaminophen (NORCO/VICODIN) 5-325 MG per tablet Take 1-2 tablets by mouth 3 (three) times daily as needed for moderate pain. 120 tablet 0  . insulin glargine (LANTUS) 100 UNIT/ML injection Inject 0.35 mLs (35 Units total) into the skin at bedtime. 10 mL 6  . insulin lispro (HUMALOG) 100 UNIT/ML injection Inject 0.08-0.18 mLs (8-18 Units total) into the skin 3 (three) times daily before meals. 10 mL 6  . levothyroxine (SYNTHROID, LEVOTHROID) 75 MCG tablet TAKE ONE (1) TABLET EACH DAY 90 tablet 2  . lisinopril (PRINIVIL,ZESTRIL)  20 MG tablet TAKE ONE TABLET TWICE DAILY 180 tablet 1  . Multiple Vitamins-Minerals (CENTRUM SILVER PO) Take 1 tablet by mouth daily.      Marland Kitchen omeprazole (PRILOSEC) 20 MG capsule TAKE ONE CAPSULE BY MOUTH DAILY 30 capsule 3  . ondansetron (ZOFRAN) 4 MG tablet Take 1 tablet (4 mg total) by mouth every 8 (eight) hours as needed. 90 tablet 11  . rOPINIRole (REQUIP) 0.25 MG tablet Take 0.25 mg by mouth at bedtime.    . triamcinolone cream (KENALOG) 0.1 % Apply topically 2 (two) times daily. 30 g 0  . ULTICARE  INSULIN SYRINGE 31G X 5/16" 0.5 ML MISC USE AS DIRECTED 100 each 2  . ZENPEP 5000 UNITS CPEP TAKE 3 CAPSULES BY MOUTH 3 TIMES DAILY AS DIRECTED 800 capsule 1   No current facility-administered medications on file prior to visit.     Past Medical History  Diagnosis Date  . Anxiety   . CHF (congestive heart failure)   . Depression   . Diabetes mellitus     Followed by Dr. Tedd Sias at [facility]  . GERD (gastroesophageal reflux disease)   . Hypertension   . Hypothyroidism   . Osteoporosis   . Allergy   . Hyperlipidemia   . RLS (restless legs syndrome)   . Pancreatic disease     pancreatic failure  . Urinary incontinence   . Pancreatitis     Pancreatic resection/ open wound- [facility] 11/04  . Pneumonia     Pneumonia/ Emphysema 05/05  . 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 TIA (transient ischemic attack)     recurrent 12/15  . History of pleural empyema   . Sepsis   . Frequent falls   . Arthritis     left knee, [facility]  . CAD (coronary artery disease), native coronary artery      1//15  T mid-LAD  stent OM2  . Parkinson's disease      Past Surgical History  Procedure Laterality Date  . Breast lumpectomy      Lumpectomy right breast  . 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 Abdominal hysterectomy      Hysteroscopy/ D & C ([facility]) 12/05  . Removal of pancreas  2004  . Kyphoplasty  2005  . Broken shoulder and orbital bone  2005  . Cholecystectomy  1973  . Thoracentesis    . Vaginal delivery      7  . Cardiac catheterization  08/29/2013    x1 stent @ [facility]  . Cataract surgery       Family History  Problem Relation Age of Onset  . Heart disease Mother   . Heart attack Mother   . Hypertension Mother   . Heart attack Father      History   Social History  . Marital Status: Widowed    Spouse Name: N/A    Number of Children: 7  . Years of Education: 6   Occupational  History  . retired- Veterinary surgeon    Social History Main Topics  . Smoking status: Never Smoker   . Smokeless tobacco: Never Used  . Alcohol Use: No  . Drug Use: No  . Sexual Activity: Not on file   Other Topics Concern  . Not on file   Social History Narrative   Lives in home alone, has nursing aid, and alert device in [place]. Has 7  children.            Has Kendal HymenBonnie, daughter, as health care POA   Not sure about DNR   Permission to speak to daughter-in-law, Georgian Congela Blaszczyk, regarding health care matters.     ROS A 10 point review of system was performed. It is negative other than that mentioned in the history of present illness.   PHYSICAL EXAM   BP 140/64 mmHg  Pulse 56  Ht 4\' 11"  (1.499 m)  Wt 182 lb 8 oz (82.781 kg)  BMI 36.84 kg/m2 Constitutional: She is oriented to person, place, and time. She appears well-developed and well-nourished. No distress.  HENT: No nasal discharge.  Head: Normocephalic and atraumatic.  Eyes: Pupils are equal and round. No discharge.  Neck: Normal range of motion. Neck supple. No JVD present. No thyromegaly present.  Cardiovascular: Normal rate, regular rhythm, normal heart sounds. Exam reveals no gallop and no friction rub. No murmur heard.  Pulmonary/Chest: Effort normal and breath sounds normal. No stridor. No respiratory distress. She has no wheezes. She has no rales. She exhibits no tenderness.  Abdominal: Soft. Bowel sounds are normal. She exhibits no distension. There is no tenderness. There is no rebound and no guarding.  Musculoskeletal: Normal range of motion. She exhibits no edema and no tenderness.  Neurological: She is alert and oriented to person, place, and time. Coordination normal.  Skin: Skin is warm and dry. No rash noted. She is not diaphoretic. No erythema. No pallor.  Psychiatric: She has a normal mood and affect. Her behavior is normal. Judgment and thought content normal.   WUJ:WJXBJEKG:Sinus  Bradycardia  -First degree A-V  block  PRi = 282 Low voltage in precordial leads.   -Poor R-wave progression -may be secondary to pulmonary disease   consider old anterior infarct.   -  Nonspecific T-abnormality.     ASSESSMENT AND PLAN

## 2014-08-28 NOTE — Assessment & Plan Note (Signed)
Blood pressure is reasonably controlled. She is mildly bradycardic but is asymptomatic. If this gets worse, Toprol should be decreased.

## 2014-08-28 NOTE — Assessment & Plan Note (Signed)
She is doing reasonably well with no symptoms of angina. Continue medical therapy. 

## 2014-08-28 NOTE — Telephone Encounter (Signed)
This encounter was created in error - please disregard.

## 2014-08-28 NOTE — Assessment & Plan Note (Signed)
She is scheduled to have a loop recorder placed by Dr. Graciela HusbandsKlein in order to evaluate for possible paroxysmal atrial fibrillation. If that is confirmed, she will require anticoagulation.

## 2014-08-28 NOTE — Assessment & Plan Note (Signed)
She appears to be euvolemic on current dose of furosemide. 

## 2014-08-28 NOTE — Telephone Encounter (Signed)
Informed patient procedure rescheduled to 09/16/14 at [facility]. Patient is agreeable to date change.

## 2014-08-28 NOTE — Telephone Encounter (Signed)
Per phone call from Well[facility]Sherri Price  Contacted Judy at [facility] specials scheduling and rescheduled Loop recorder from 09/09/14 to 09/16/14  Darel HongJudy confirmed new date

## 2014-08-29 DIAGNOSIS — E119 Type 2 diabetes mellitus without complications: Secondary | ICD-10-CM

## 2014-08-29 DIAGNOSIS — M75102 Unspecified rotator cuff tear or rupture of left shoulder, not specified as traumatic: Secondary | ICD-10-CM

## 2014-08-29 DIAGNOSIS — S43421D Sprain of right rotator cuff capsule, subsequent encounter: Secondary | ICD-10-CM

## 2014-09-05 ENCOUNTER — Other Ambulatory Visit: Payer: Self-pay

## 2014-09-05 MED ORDER — PRAVASTATIN SODIUM 40 MG PO TABS: 40.0000 mg | ORAL_TABLET | Freq: Every day | ORAL | Status: DC

## 2014-09-05 NOTE — Telephone Encounter (Signed)
Tried calling patient to confirm dose but unable to reach. Should patient be taking 20 mg or 40 mg of pravastatin at this time? Refill request from [facility] was 40 mg and your last office note stated 40 mg but Cardiology removed 40 mg from list and added 20 mg at office visit on 08/28/14.Please advise

## 2014-09-05 NOTE — Telephone Encounter (Signed)
Please continue 40mg  daily.

## 2014-09-09 ENCOUNTER — Telehealth: Payer: Self-pay | Admitting: *Deleted

## 2014-09-09 NOTE — Telephone Encounter (Signed)
Spoke with patients daughter  Conformed loop recorder implant date change  Instructed her to arrive with [facility] at the [facility] mall [facility] at 0630 on 09/16/14 Reviewed procedure instructions  Patients daughter verbalized understanding

## 2014-09-10 ENCOUNTER — Other Ambulatory Visit: Payer: Self-pay | Admitting: *Deleted

## 2014-09-10 MED ORDER — METOPROLOL SUCCINATE ER 200 MG PO TB24: 200.0000 mg | ORAL_TABLET | Freq: Every day | ORAL | Status: DC

## 2014-09-15 ENCOUNTER — Other Ambulatory Visit: Payer: Self-pay | Admitting: *Deleted

## 2014-09-15 ENCOUNTER — Telehealth: Payer: Self-pay | Admitting: *Deleted

## 2014-09-15 ENCOUNTER — Ambulatory Visit: Payer: Medicare Other | Admitting: Internal Medicine

## 2014-09-15 MED ORDER — PANCRELIPASE (LIP-PROT-AMYL) 5000 UNITS PO CPEP: ORAL_CAPSULE | ORAL | Status: DC

## 2014-09-15 NOTE — Telephone Encounter (Signed)
Faxed last office note and patient info to specials for loop insertion

## 2014-09-15 NOTE — Telephone Encounter (Signed)
Ok refill? 

## 2014-09-16 ENCOUNTER — Ambulatory Visit: Payer: Self-pay | Admitting: Internal Medicine

## 2014-09-16 ENCOUNTER — Telehealth: Payer: Self-pay

## 2014-09-16 ENCOUNTER — Ambulatory Visit: Payer: Medicare Other

## 2014-09-16 MED ORDER — HYDROCODONE-ACETAMINOPHEN 5-325 MG PO TABS: 1.0000 | ORAL_TABLET | Freq: Three times a day (TID) | ORAL | Status: DC | PRN

## 2014-09-16 NOTE — Telephone Encounter (Signed)
Bonnie aware Rx to be picked up this afternoon

## 2014-09-16 NOTE — Telephone Encounter (Signed)
OK. Fine to refill. 

## 2014-09-16 NOTE — Telephone Encounter (Signed)
The patient's daughter called and is hoping to get a refill of the patient's hydrocodone medication (the daughter states the patient is completely out of the medication.   Daughter's callback - [phone] [name](Bonnie)

## 2014-09-22 ENCOUNTER — Encounter: Payer: Self-pay | Admitting: Surgery

## 2014-09-29 ENCOUNTER — Telehealth: Payer: Self-pay | Admitting: *Deleted

## 2014-09-29 NOTE — Telephone Encounter (Signed)
Renee RivalSherry Miles, Home health nurse called states last night during her visit with the pt, she reported that after taking her Zyrtec she began seeing monsters in her room.  Cordelia PenSherry further states that upon her review there has been no changes with pt.  This is also the first time pt had reported anything like that.

## 2014-09-29 NOTE — Telephone Encounter (Signed)
Left vm for pt to return my call.  

## 2014-09-29 NOTE — Telephone Encounter (Signed)
OK. Let's have her hold the Zyrtec. If any recurrent hallucinations, then needs to be seen prior to scheduled visit.

## 2014-09-30 ENCOUNTER — Ambulatory Visit: Payer: Medicare Other

## 2014-09-30 NOTE — Telephone Encounter (Signed)
Notified pt, Pt verbalized understanding 

## 2014-10-06 ENCOUNTER — Encounter: Payer: Self-pay | Admitting: Internal Medicine

## 2014-10-06 ENCOUNTER — Telehealth: Payer: Self-pay | Admitting: *Deleted

## 2014-10-06 NOTE — Telephone Encounter (Signed)
Pt has redness & dampness under both breast. Sherri with [facility] is asking for permission to use an antifungal powder that they have on hand. Please advise.

## 2014-10-06 NOTE — Telephone Encounter (Signed)
Fine to use antifungal powder.

## 2014-10-07 ENCOUNTER — Other Ambulatory Visit: Payer: Self-pay | Admitting: *Deleted

## 2014-10-07 MED ORDER — PANCRELIPASE (LIP-PROT-AMYL) 5000 UNITS PO CPEP: ORAL_CAPSULE | ORAL | Status: DC

## 2014-10-07 MED ORDER — PANCRELIPASE (LIP-PROT-AMYL) 15000-51000 UNITS PO CPEP: 1.0000 | ORAL_CAPSULE | Freq: Three times a day (TID) | ORAL | Status: DC

## 2014-10-07 NOTE — Telephone Encounter (Signed)
Notified home health nurse

## 2014-10-15 ENCOUNTER — Encounter: Payer: Self-pay | Admitting: *Deleted

## 2014-10-16 ENCOUNTER — Other Ambulatory Visit: Payer: Self-pay | Admitting: *Deleted

## 2014-10-16 ENCOUNTER — Telehealth: Payer: Self-pay

## 2014-10-16 ENCOUNTER — Ambulatory Visit (INDEPENDENT_AMBULATORY_CARE_PROVIDER_SITE_OTHER): Payer: Medicare Other | Admitting: *Deleted

## 2014-10-16 DIAGNOSIS — I6389 Other cerebral infarction: Secondary | ICD-10-CM

## 2014-10-16 DIAGNOSIS — I638 Other cerebral infarction: Secondary | ICD-10-CM

## 2014-10-16 MED ORDER — HYDROCODONE-ACETAMINOPHEN 5-325 MG PO TABS: 1.0000 | ORAL_TABLET | Freq: Three times a day (TID) | ORAL | Status: DC | PRN

## 2014-10-16 NOTE — Telephone Encounter (Signed)
Printed, Signed, Put up front ready for pick up

## 2014-10-16 NOTE — Telephone Encounter (Signed)
Fine to refill 

## 2014-10-16 NOTE — Telephone Encounter (Signed)
The patient called hoping to get a refill of her hydrocodone pain medication printed. Thanks!    Pt callback - [phone]

## 2014-10-16 NOTE — Telephone Encounter (Signed)
Okay to refill? Last Rx 09/16/14

## 2014-10-17 ENCOUNTER — Other Ambulatory Visit: Payer: Self-pay | Admitting: Internal Medicine

## 2014-10-17 NOTE — Progress Notes (Signed)
Loop recorder 

## 2014-10-21 ENCOUNTER — Encounter
Admit: 2014-10-21 | Disposition: A | Discharge status: Home / Self Care | Payer: Self-pay | Attending: Surgery | Admitting: Surgery

## 2014-10-23 ENCOUNTER — Encounter: Payer: Self-pay | Admitting: Internal Medicine

## 2014-10-23 ENCOUNTER — Ambulatory Visit (INDEPENDENT_AMBULATORY_CARE_PROVIDER_SITE_OTHER): Payer: Medicare Other | Admitting: Internal Medicine

## 2014-10-23 VITALS — BP 160/86 | HR 65 | Temp 97.9°F | Ht 59.0 in | Wt 183.2 lb

## 2014-10-23 DIAGNOSIS — E119 Type 2 diabetes mellitus without complications: Secondary | ICD-10-CM

## 2014-10-23 DIAGNOSIS — I638 Other cerebral infarction: Secondary | ICD-10-CM

## 2014-10-23 DIAGNOSIS — M179 Osteoarthritis of knee, unspecified: Secondary | ICD-10-CM

## 2014-10-23 DIAGNOSIS — M75102 Unspecified rotator cuff tear or rupture of left shoulder, not specified as traumatic: Secondary | ICD-10-CM

## 2014-10-23 DIAGNOSIS — I10 Essential (primary) hypertension: Secondary | ICD-10-CM

## 2014-10-23 DIAGNOSIS — IMO0002 Reserved for concepts with insufficient information to code with codable children: Secondary | ICD-10-CM

## 2014-10-23 DIAGNOSIS — M1710 Unilateral primary osteoarthritis, unspecified knee: Secondary | ICD-10-CM

## 2014-10-23 DIAGNOSIS — I6389 Other cerebral infarction: Secondary | ICD-10-CM

## 2014-10-23 DIAGNOSIS — R413 Other amnesia: Secondary | ICD-10-CM

## 2014-10-23 MED ORDER — INSULIN GLARGINE 100 UNIT/ML ~~LOC~~ SOLN: 45.0000 [IU] | Freq: Every day | SUBCUTANEOUS | Status: DC

## 2014-10-23 MED ORDER — INSULIN LISPRO 100 UNIT/ML ~~LOC~~ SOLN: 8.0000 [IU] | Freq: Three times a day (TID) | SUBCUTANEOUS | Status: DC

## 2014-10-23 NOTE — Assessment & Plan Note (Signed)
Severe bilateral knee pain, left>right, secondary to OA. S/p steroid and synvisc injection in past. Will set up follow up with ortho. Question if she might be candidate for repeat Synvisc. Continue prn hydrocodone for severe pain.

## 2014-10-23 NOTE — Assessment & Plan Note (Signed)
S/p recent CVA. Loop monitor recently completed to determine if arrhythmia may have contributed. Results pending. Currently anticoagulated only with Aspirin and Plavix. Follow up with Dr. Graciela HusbandsKlein pending.

## 2014-10-23 NOTE — Progress Notes (Signed)
Subjective:    Patient ID: [name], female    DOB: 10/12/1928, 81 y.o.   MRN: [id number]  HPI  81YO female presents for follow up.  Last seen 07/2014 after admission for CVA.  Left shoulder pain - Continues to have pain and weakness of abduction. History of rotator cuff tear in 07/2014.  DM - Scheduled to see Dr. Tedd SiasSolum and have labs. BG have been running near 200. Compliant with medication. Lantus has been increased to 45units daily. BG improved some with this increase. No BG below 70.  CVA - Has follow up scheduled with Dr. Graciela HusbandsKlein next week. Completed the Loop monitor.  No symptoms of palpitations, chest pain. However, feels weak at times. No lightheadedness.  Past medical, surgical, family and social history per today's encounter.  Review of Systems  Constitutional: Negative for fever, chills, appetite change, fatigue and unexpected weight change.  Eyes: Negative for visual disturbance.  Respiratory: Negative for shortness of breath.   Cardiovascular: Negative for chest pain and leg swelling.  Gastrointestinal: Negative for nausea, vomiting, abdominal pain, diarrhea and constipation.  Musculoskeletal: Positive for myalgias and arthralgias (left knee and bilateral shoulders). Negative for joint swelling.  Skin: Negative for color change and rash.  Hematological: Negative for adenopathy. Does not bruise/bleed easily.  Psychiatric/Behavioral: Positive for confusion and decreased concentration. Negative for suicidal ideas, sleep disturbance and dysphoric mood. The patient is not nervous/anxious.        Objective:    BP 160/86 mmHg  Pulse 65  Temp(Src) 97.9 F (36.6 C) (Oral)  Ht 4\' 11"  (1.499 m)  Wt 183 lb 4 oz (83.122 kg)  BMI 36.99 kg/m2  SpO2 94% Physical Exam  Constitutional: She is oriented to person, place, and time. She appears well-developed and well-nourished. No distress.  HENT:  Head: Normocephalic and atraumatic.  Right Ear: External ear normal.  Left  Ear: External ear normal.  Nose: Nose normal.  Mouth/Throat: Oropharynx is clear and moist. No oropharyngeal exudate.  Eyes: Conjunctivae are normal. Pupils are equal, round, and reactive to light. Right eye exhibits no discharge. Left eye exhibits no discharge. No scleral icterus.  Neck: Normal range of motion. Neck supple. No tracheal deviation present. No thyromegaly present.  Cardiovascular: Normal rate, regular rhythm, normal heart sounds and intact distal pulses.  Exam reveals no gallop and no friction rub.   No murmur heard. Pulmonary/Chest: Effort normal and breath sounds normal. No respiratory distress. She has no wheezes. She has no rales. She exhibits no tenderness.  Musculoskeletal: She exhibits no edema.       Right shoulder: She exhibits decreased range of motion, tenderness, pain and decreased strength (abduction).       Left shoulder: She exhibits decreased range of motion, tenderness and decreased strength (abduction).       Left knee: She exhibits decreased range of motion.  Crepitus left knee  Lymphadenopathy:    She has no cervical adenopathy.  Neurological: She is alert and oriented to person, place, and time. No cranial nerve deficit. She exhibits normal muscle tone. Coordination normal.  Skin: Skin is warm and dry. No rash noted. She is not diaphoretic. No erythema. No pallor.  Psychiatric: She has a normal mood and affect. Her speech is normal and behavior is normal. Judgment and thought content normal. Cognition and memory are impaired.          Assessment & Plan:   Problem List Items Addressed This Visit      High  Diabetes type 2, controlled - Primary    BG have improved with higher dose of Lantus. Plan to repeat A1c end of 10/2014. Follow up with Dr. Tedd Sias as scheduled.      Relevant Medications   insulin glargine (LANTUS) 100 UNIT/ML injection   insulin lispro (HUMALOG) 100 UNIT/ML injection     Unprioritized   CVA (cerebral infarction)    S/p  recent CVA. Loop monitor recently completed to determine if arrhythmia may have contributed. Results pending. Currently anticoagulated only with Aspirin and Plavix. Follow up with Dr. Graciela Husbands pending.      Essential hypertension    BP Readings from Last 3 Encounters:  10/23/14 160/86  08/28/14 140/64  08/11/14 128/76   BP slightly elevated today. Will monitor for now and continue current medications. Follow up pending with Dr. Graciela Husbands next week.      Left rotator cuff tear    Reviewed MRI from 07/2014 showing left rotator cuff tear. She has not yet followed up with ortho. Having some pain in left shoulder and decreased strength of abduction. Will set up ortho evaluation. Continue prn hydrocodone for severe pain.      Relevant Orders   Ambulatory referral to Orthopedic Surgery   Memory loss    Short term memory loss noted on today's exam, worse than previous with frequent repetition of information.  Recent electrolytes, CBC were stable. Repeat labs pending in 2 weeks. Will continue to monitor closely.      Osteoarthrosis, unspecified whether generalized or localized, involving lower leg    Severe bilateral knee pain, left>right, secondary to OA. S/p steroid and synvisc injection in past. Will set up follow up with ortho. Question if she might be candidate for repeat Synvisc. Continue prn hydrocodone for severe pain.          Return in about 3 months (around 01/23/2015).

## 2014-10-23 NOTE — Assessment & Plan Note (Signed)
BG have improved with higher dose of Lantus. Plan to repeat A1c end of 10/2014. Follow up with Dr. Tedd SiasSolum as scheduled.

## 2014-10-23 NOTE — Assessment & Plan Note (Signed)
BP Readings from Last 3 Encounters:  10/23/14 160/86  08/28/14 140/64  08/11/14 128/76   BP slightly elevated today. Will monitor for now and continue current medications. Follow up pending with Dr. Graciela HusbandsKlein next week.

## 2014-10-23 NOTE — Assessment & Plan Note (Signed)
Reviewed MRI from 07/2014 showing left rotator cuff tear. She has not yet followed up with ortho. Having some pain in left shoulder and decreased strength of abduction. Will set up ortho evaluation. Continue prn hydrocodone for severe pain.

## 2014-10-23 NOTE — Progress Notes (Signed)
Pre visit review using our clinic review tool, if applicable. No additional management support is needed unless otherwise documented below in the visit note. 

## 2014-10-23 NOTE — Assessment & Plan Note (Signed)
Short term memory loss noted on today's exam, worse than previous with frequent repetition of information.  Recent electrolytes, CBC were stable. Repeat labs pending in 2 weeks. Will continue to monitor closely.

## 2014-10-23 NOTE — Patient Instructions (Signed)
We will set up an evaluation with orthopedics.  Follow up with Dr. Graciela HusbandsKlein and Dr. Tedd SiasSolum as scheduled.  Follow up in 3 months.

## 2014-10-24 ENCOUNTER — Telehealth: Payer: Self-pay | Admitting: *Deleted

## 2014-10-24 NOTE — Telephone Encounter (Signed)
Spoke with Roanna RaiderSherri, advised of MDs message.

## 2014-10-24 NOTE — Telephone Encounter (Signed)
That is fine 

## 2014-10-24 NOTE — Telephone Encounter (Signed)
Sherri RN from [facility] called requesting whether she can order [facility] to come out to evaluate pt for short term memory loss.  Please advise

## 2014-10-30 ENCOUNTER — Ambulatory Visit (INDEPENDENT_AMBULATORY_CARE_PROVIDER_SITE_OTHER): Payer: Medicare Other

## 2014-10-30 DIAGNOSIS — Z95818 Presence of other cardiac implants and grafts: Secondary | ICD-10-CM

## 2014-10-30 LAB — MDC_IDC_ENUM_SESS_TYPE_REMOTE
Date Time Interrogation Session: 20160126143623
MDC IDC SET ZONE DETECTION INTERVAL: 410 ms
Zone Setting Detection Interval: 2000 ms
Zone Setting Detection Interval: 3000 ms

## 2014-10-30 NOTE — Progress Notes (Signed)
1.) Reason for visit: Wound check  2.) Name of MD requesting visit: Dr. Graciela HusbandsKlein  3.) H&P: [facility] by Dr. Graciela HusbandsKlein  4.) ROS related to problem: Pt reports that she has not had any issues w/ implantation site and "hardly even knows it's there".  On assessment, there is no redness and the site looks to be healing nicely, only a small pink line is noted. Pt denies any pain or tenderness at the site.   5.) Assessment and plan per MD: Dr. Kirke CorinArida assessed area and states that it is healing well.

## 2014-11-10 ENCOUNTER — Encounter: Payer: Self-pay | Admitting: Internal Medicine

## 2014-11-11 ENCOUNTER — Telehealth: Payer: Self-pay | Admitting: *Deleted

## 2014-11-11 NOTE — Telephone Encounter (Signed)
Spoke with French Anaracy, advised of MDs message.  French Anaracy verbalized understanding and states she will call pts family to advise.

## 2014-11-11 NOTE — Telephone Encounter (Signed)
French Anaracy from [facility] called requesting an order for a UA for UTI.  States pt is confused today.  Please advise

## 2014-11-11 NOTE — Telephone Encounter (Signed)
No. Needs to be seen if confused. Either ED or urgent care tonight.

## 2014-11-13 ENCOUNTER — Encounter: Payer: Self-pay | Admitting: Internal Medicine

## 2014-11-13 ENCOUNTER — Ambulatory Visit: Payer: Medicare Other | Admitting: Internal Medicine

## 2014-11-13 ENCOUNTER — Ambulatory Visit (INDEPENDENT_AMBULATORY_CARE_PROVIDER_SITE_OTHER): Payer: Medicare Other | Admitting: Internal Medicine

## 2014-11-13 VITALS — BP 129/64 | HR 63 | Temp 97.8°F | Ht 59.0 in | Wt 181.5 lb

## 2014-11-13 DIAGNOSIS — I638 Other cerebral infarction: Secondary | ICD-10-CM

## 2014-11-13 DIAGNOSIS — R309 Painful micturition, unspecified: Secondary | ICD-10-CM

## 2014-11-13 DIAGNOSIS — R5381 Other malaise: Secondary | ICD-10-CM | POA: Diagnosis not present

## 2014-11-13 LAB — CBC WITH DIFFERENTIAL/PLATELET
BASOS ABS: 0 10*3/uL (ref 0.0–0.1)
Basophils Relative: 0 % (ref 0–1)
Eosinophils Absolute: 0.2 10*3/uL (ref 0.0–0.7)
Eosinophils Relative: 3 % (ref 0–5)
HCT: 38.4 % (ref 36.0–46.0)
Hemoglobin: 12.3 g/dL (ref 12.0–15.0)
Lymphocytes Relative: 33 % (ref 12–46)
Lymphs Abs: 1.7 10*3/uL (ref 0.7–4.0)
MCH: 29.4 pg (ref 26.0–34.0)
MCHC: 32 g/dL (ref 30.0–36.0)
MCV: 91.9 fL (ref 78.0–100.0)
MPV: 10.8 fL (ref 8.6–12.4)
Monocytes Absolute: 0.5 10*3/uL (ref 0.1–1.0)
Monocytes Relative: 10 % (ref 3–12)
NEUTROS ABS: 2.8 10*3/uL (ref 1.7–7.7)
Neutrophils Relative %: 54 % (ref 43–77)
PLATELETS: 108 10*3/uL — AB (ref 150–400)
RBC: 4.18 MIL/uL (ref 3.87–5.11)
RDW: 14 % (ref 11.5–15.5)
WBC: 5.2 10*3/uL (ref 4.0–10.5)

## 2014-11-13 NOTE — Progress Notes (Signed)
Subjective:    Patient ID: [name], female    DOB: 1929-08-18, 81 y.o.   MRN: [id number]  HPI  81YO female presents for acute visit.  Yesterday morning developed nausea and diarrhea. Diarrhea was watery, non-bloody. Diarrhea has now stopped. No fever. Feels short of breath chronically, particularly with exertion. No cough. No chest pain.  No urinary symptoms. Son reports pt is not acting like herself. Ever since having stroke, seems to be confused at home. Son reports that she may have been taking her medication incorrectly this week, perhaps double doses of morning meds. No falls or injuries. Blood sugars have been more elevated recently, near 200.  Past medical, surgical, family and social history per today's encounter.  Review of Systems  Constitutional: Positive for fatigue. Negative for fever, chills, appetite change and unexpected weight change.  Eyes: Negative for visual disturbance.  Respiratory: Negative for shortness of breath.   Cardiovascular: Negative for chest pain and leg swelling.  Gastrointestinal: Positive for diarrhea. Negative for nausea, vomiting, abdominal pain, constipation, blood in stool and rectal pain.  Musculoskeletal: Positive for myalgias, arthralgias and gait problem (unstable and uses [place]).  Skin: Negative for color change and rash.  Hematological: Negative for adenopathy. Does not bruise/bleed easily.  Psychiatric/Behavioral: Negative for sleep disturbance and dysphoric mood. The patient is not nervous/anxious.        Objective:    BP 129/64 mmHg  Pulse 63  Temp(Src) 97.8 F (36.6 C) (Oral)  Ht  (1.499 m)  Wt 181 lb 8 oz (82.328 kg)  BMI 36.64 kg/m2  SpO2 97% Physical Exam  Constitutional: She is oriented to person, place, and time. She appears well-developed and well-nourished. No distress.  HENT:  Head: Normocephalic and atraumatic.  Right Ear: External ear normal.  Left Ear: External ear normal.  Nose: Nose normal.    Mouth/Throat: Oropharynx is clear and moist. No oropharyngeal exudate.  Eyes: Conjunctivae are normal. Pupils are equal, round, and reactive to light. Right eye exhibits no discharge. Left eye exhibits no discharge. No scleral icterus.  Neck: Normal range of motion. Neck supple. No tracheal deviation present. No thyromegaly present.  Cardiovascular: Normal rate, regular rhythm, normal heart sounds and intact distal pulses.  Exam reveals no gallop and no friction rub.   No murmur heard. Pulmonary/Chest: Effort normal and breath sounds normal. No accessory muscle usage. No respiratory distress. She has no decreased breath sounds. She has no wheezes. She has no rhonchi. She has no rales. She exhibits no tenderness.  Musculoskeletal: Normal range of motion. She exhibits no edema or tenderness.  Lymphadenopathy:    She has no cervical adenopathy.  Neurological: She is alert and oriented to person, place, and time. No cranial nerve deficit. She exhibits normal muscle tone. Coordination normal.  Skin: Skin is warm and dry. No rash noted. She is not diaphoretic. No erythema. No pallor.  Psychiatric: She has a normal mood and affect. Her behavior is normal. Judgment and thought content normal.          Assessment & Plan:  Over of which >50% spent in face-to-face contact with patient discussing plan of care  Problem List Items Addressed This Visit      Unprioritized   Malaise - Primary    Generalized malaise noted by pt. One episode of diarrhea yesterday, but no persistent symptoms and no other focal symptoms. Exam is unchanged from previous. UA normal. Will check CBC, CMP. Intermittent confusion has been present since CVA and  is unchanged. Follow up in 1 week and prn if any focal symptoms develop.      Relevant Orders   TSH   CBC with Differential/Platelet   Comprehensive metabolic panel   Lipase   CULTURE, URINE COMPREHENSIVE    Other Visit Diagnoses    Painful urination         Relevant Orders    POCT urinalysis dipstick        Return in about 1 week (around 11/20/2014) for Recheck.

## 2014-11-13 NOTE — Assessment & Plan Note (Addendum)
Generalized malaise noted by pt. One episode of diarrhea yesterday, but no persistent symptoms and no other focal symptoms. Exam is unchanged from previous. UA normal. Will check CBC, CMP. Intermittent confusion has been present since CVA and is unchanged. Follow up in 1 week and prn if any focal symptoms develop.

## 2014-11-13 NOTE — Patient Instructions (Signed)
Increase fluid intake.  Labs today.  Please call if any worsening symptoms, diarrhea, vomiting, fever, shortness of breath, chest pain or other concerns.  Follow up next week.

## 2014-11-13 NOTE — Progress Notes (Signed)
Pre visit review using our clinic review tool, if applicable. No additional management support is needed unless otherwise documented below in the visit note. 

## 2014-11-14 ENCOUNTER — Ambulatory Visit (INDEPENDENT_AMBULATORY_CARE_PROVIDER_SITE_OTHER): Payer: Medicare Other | Admitting: *Deleted

## 2014-11-14 DIAGNOSIS — I6389 Other cerebral infarction: Secondary | ICD-10-CM

## 2014-11-14 DIAGNOSIS — I638 Other cerebral infarction: Secondary | ICD-10-CM | POA: Diagnosis not present

## 2014-11-14 LAB — COMPREHENSIVE METABOLIC PANEL
ALT: <8 U/L (ref 0–35)
AST: 15 U/L (ref 0–37)
Albumin: 3.8 g/dL (ref 3.5–5.2)
Alkaline Phosphatase: 78 U/L (ref 39–117)
BILIRUBIN TOTAL: 0.4 mg/dL (ref 0.2–1.2)
BUN: 22 mg/dL (ref 6–23)
CO2: 29 meq/L (ref 19–32)
CREATININE: 1.81 mg/dL — AB (ref 0.50–1.10)
Calcium: 8.9 mg/dL (ref 8.4–10.5)
Chloride: 102 mEq/L (ref 96–112)
Glucose, Bld: 79 mg/dL (ref 70–99)
POTASSIUM: 4.4 meq/L (ref 3.5–5.3)
Sodium: 143 mEq/L (ref 135–145)
Total Protein: 6.3 g/dL (ref 6.0–8.3)

## 2014-11-14 LAB — LIPASE: Lipase: <10 U/L (ref 0–75)

## 2014-11-14 LAB — MDC_IDC_ENUM_SESS_TYPE_REMOTE: Date Time Interrogation Session: 20160416040500

## 2014-11-14 LAB — TSH: TSH: 1.484 u[IU]/mL (ref 0.350–4.500)

## 2014-11-15 LAB — CULTURE, URINE COMPREHENSIVE: Colony Count: >=100000

## 2014-11-17 ENCOUNTER — Other Ambulatory Visit: Payer: Self-pay | Admitting: *Deleted

## 2014-11-17 ENCOUNTER — Other Ambulatory Visit: Payer: Self-pay

## 2014-11-17 MED ORDER — CIPROFLOXACIN HCL 250 MG PO TABS: 250.0000 mg | ORAL_TABLET | Freq: Two times a day (BID) | ORAL | Status: DC

## 2014-11-17 MED ORDER — HYDROCODONE-ACETAMINOPHEN 5-325 MG PO TABS: 1.0000 | ORAL_TABLET | Freq: Three times a day (TID) | ORAL | Status: DC | PRN

## 2014-11-17 NOTE — Telephone Encounter (Signed)
Patient's daughter called stating her mother needs a refill on her vicodin Rx. Patient was last seen in office 11/13/14 and medication was last filled 10/16/14. Please advise.

## 2014-11-18 NOTE — Progress Notes (Signed)
Loop recorder 

## 2014-11-19 ENCOUNTER — Other Ambulatory Visit: Payer: Self-pay | Admitting: *Deleted

## 2014-11-19 MED ORDER — AMLODIPINE BESYLATE 5 MG PO TABS: 5.0000 mg | ORAL_TABLET | Freq: Every day | ORAL | Status: DC

## 2014-11-21 ENCOUNTER — Encounter
Admit: 2014-11-21 | Disposition: A | Discharge status: Home / Self Care | Payer: Self-pay | Attending: Surgery | Admitting: Surgery

## 2014-11-24 ENCOUNTER — Ambulatory Visit: Payer: Medicare Other | Admitting: Internal Medicine

## 2014-11-24 ENCOUNTER — Telehealth: Payer: Self-pay | Admitting: *Deleted

## 2014-11-24 NOTE — Telephone Encounter (Signed)
Pt called and wanted to cancel her appt today that she has at 1:00pm

## 2014-11-26 ENCOUNTER — Telehealth: Payer: Self-pay | Admitting: Cardiovascular Disease

## 2014-11-26 NOTE — Telephone Encounter (Signed)
Spoke w/ Ronette Deterheri Miles, RN w/ [facility].  She reports that pt had a 3.6 lb wt gain overnight, lungs are clear, pt denies SOB, SpO2 95% on room air. Pt is currently on Lasix 40 mg daily. She reports that had chicken salad w/ crackers yesterday. Advised her to have pt take additional lasix today and continue to monitor sx.  She is agreeable and will call back w/ further questions or concerns.

## 2014-11-26 NOTE — Telephone Encounter (Signed)
Per Cheri at [facility] patient weighs everyday.    Today Patient had a 3.6 lbs weight gain overnight no SOB, lungs clear,  on lasix 40 mg daily  Any changes suggested  Transferred to triage

## 2014-11-27 ENCOUNTER — Ambulatory Visit: Payer: Medicare Other | Admitting: Cardiovascular Disease

## 2014-11-29 ENCOUNTER — Encounter: Payer: Self-pay | Admitting: Internal Medicine

## 2014-12-02 ENCOUNTER — Other Ambulatory Visit: Payer: Self-pay | Admitting: Internal Medicine

## 2014-12-02 ENCOUNTER — Other Ambulatory Visit: Payer: Self-pay

## 2014-12-02 MED ORDER — FUROSEMIDE 40 MG PO TABS: 40.0000 mg | ORAL_TABLET | Freq: Every day | ORAL | Status: AC

## 2014-12-02 MED ORDER — FUROSEMIDE 40 MG PO TABS: 40.0000 mg | ORAL_TABLET | Freq: Every day | ORAL | Status: DC

## 2014-12-02 NOTE — Telephone Encounter (Signed)
Faxed to pharmacy

## 2014-12-02 NOTE — Telephone Encounter (Signed)
Last visit 11/13/14, ok refill? 

## 2014-12-02 NOTE — Telephone Encounter (Signed)
Refill sent for furosemide 40 mg.  

## 2014-12-10 ENCOUNTER — Telehealth: Payer: Self-pay | Admitting: *Deleted

## 2014-12-10 NOTE — Telephone Encounter (Signed)
Pt was scheduled for an appointment on 12-12-14 at 9am with [facility] for new AF diagnosis on LINQ. Pt needs to reschedule appointment due to transportation. Routing message to [place] to call and reschedule appointment.

## 2014-12-12 ENCOUNTER — Encounter: Payer: Medicare Other | Admitting: Internal Medicine

## 2014-12-12 NOTE — Consult Note (Signed)
PATIENT NAME:  [name], [name] MR#:  [id number] DATE OF BIRTH:  07/12/1929  DATE OF CONSULTATION:  04/26/2013  ADDENDUM  REFERRING PHYSICIAN:  Dr. Excell Seltzerooper CONSULTING PHYSICIAN:  Shaune PollackQing Sopheap Boehle, MD  ALLERGIES:  BENADRYL, MORPHINE, REGLAN and TAPE.   HOME MEDICATIONS: 1.  Zenpep 5000 units-17,000-27,000 [name]. capsule, 3 caps t.i.d.  2.  Pravastatin 20 mg [name]. at bedtime.  3.  Omeprazole 20 mg [name]. daily.  4.  Lopressor 200 mg [name]. daily extended-release.  5.  Lisinopril 20 mg [name]. b.i.d.  6.  Thyroxine 70 mcg [name]. daily.  7.  Lantus 27 units subcutaneous once a day at bedtime.  8.  Sliding scale Humalog 3 times a day p.r.n.  9.  Lasix 20 mg [name]. daily.  10.  Fluticasone nasal 50 mcg inhalation nasal spray, one spray once a day. 11.  Carbidopa/levodopa 25 mg/100 mg tablets t.i.d.   12.  Buspirone 10 mg by mouth twice daily.  13.  Amlodipine 2.5 mg [name]. once a day.  14.  Alprazolam 0.5 tablets once a day in the afternoon and 1 tablet at bedtime.  15.  Acetaminophen/hydrocodone 325 mg/5 mg [name]. tablets 1 tablet t.i.d. p.r.n.   ____________________________ Shaune PollackQing Maliyah Willets, MD qc:jm D: 04/26/2013 13:55:00 ET T: 04/26/2013 15:09:45 ET JOB#: [id number]  cc: Shaune PollackQing Alyzza Andringa, MD, <Dictator> Shaune PollackQING Yoni Lobos MD ELECTRONICALLY SIGNED 04/27/2013 14:32

## 2014-12-12 NOTE — Discharge Summary (Signed)
PATIENT NAME:  [name], [name] MR#:  [id number] DATE OF BIRTH:  05/07/1929  DATE OF ADMISSION:  04/26/2013 DATE OF DISCHARGE:  04/29/2013  FINAL DIAGNOSIS:  1.  Partial small bowel obstruction, likely adhesive.  2.  Obesity.  3.  Complex ventral hernia.   HOSPITAL COURSE SUMMARY: The patient was admitted with a partial small bowel obstruction. Medicine became involved because there were multiple medical problems. The patient has a large ventral hernia which did not appeared to be a sign of the bowel obstruction. On postoperative day 1, the patient had 2 small bowel movements.  Plain films demonstrated no further evidence of air-fluid levels or dilated loops of small bowel demonstrating gas within the colon. Nasogastric tube was placed to straight drain. The patient was started on clears. On hospital day 2, she continued to improve. She had passage of flatus and another bowel movement. Nasogastric tube was removed. She was therefore discharged home in stable condition on a soft mechanical diet with p.r.n. follow-up in the office.   DISCHARGE MEDICATIONS: Are found on the reconciliation form.    ____________________________ Redge GainerMark A. Egbert GaribaldiBird, MD mab:dp D: 05/14/2013 12:39:03 ET T: 05/14/2013 13:54:26 ET JOB#: [id number]  cc: Loraine LericheMark A. Egbert GaribaldiBird, MD, <Dictator> Raynald KempMARK A Haizel Gatchell MD ELECTRONICALLY SIGNED 05/15/2013 10:22

## 2014-12-12 NOTE — H&P (Signed)
PATIENT NAME:  [name], [place] O MR#:  [id number] DATE OF BIRTH:  [birth year]  DATE OF ADMISSION:  04/26/2013  CHIEF COMPLAINT: Abdominal pain.   HISTORY OF PRESENT ILLNESS: This is a patient with the acute onset of diffuse abdominal pain that started yesterday afternoon, approximately 18 hours ago. She has never had an episode like this before. She also had a single emesis after her CAT scan. She was not throwing up before this. She has not passed any gas in the 18 hours and had a bowel movement yesterday morning which was normal.   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 She has no back pain. No melena, no hematochezia. No hematemesis.   PAST MEDICAL HISTORY: CHF, diabetes. Denies stroke or heart attack.   PAST SURGICAL HISTORY: Open cholecystectomy, hysterectomy, back surgery, shoulder injury that did not require surgery and necrosectomy or pancreatectomy.   ALLERGIES: BENADRYL, MORPHINE, REGLAN AND TAPE.   MEDICATIONS: Multiple, see chart.   FAMILY HISTORY: Noncontributory.   SOCIAL HISTORY: The patient does not smoke or drink.   REVIEW OF SYSTEMS: A 10-system review was performed and negative with the exception of that mentioned in the HPI.   PHYSICAL EXAMINATION:  GENERAL: Healthy, comfortable-appearing female patient.  VITAL SIGNS: Temperature of 98.2, pulse of 65, respirations 18, blood pressure 183/63, 95% room air sat. Pain scale of 6.  HEENT: No scleral icterus. An NG tube is in place.  NECK: No palpable neck nodes.  CHEST: Clear to auscultation.  CARDIAC: Regular rate and rhythm.  ABDOMEN: Soft. Multiple scars are present. There is an excoriated scar on the midline and a possible ventral hernia. There is some distention with tympany in the epigastrium. Diffuse abdominal tenderness is present, but it is very minimal, without peritoneal signs.  EXTREMITIES:  Without edema.  NEUROLOGIC: Grossly intact.  INTEGUMENT: No jaundice.   IMAGING: CT scan is personally reviewed. Distended, fluid-filled loops of bowel are present. No transition point is appreciated by my review. There is a large ventral hernia, and of note, there is only skin, possibly skin graft over the top of the area where there is excoriation. There is very little tissue over the bowel. I will await the official reading concerning this. There may even be some ascites present.   LABORATORY VALUES: Demonstrate a white blood cell count of 9.0, H and H of 14.7 and 43, with a platelet count of 95. CO2 is 34. Electrolytes are otherwise within normal limits. AST of 70.   ASSESSMENT AND PLAN: This is a patient with a partial small bowel obstruction. She has a significant history for necrotizing pancreatitis, with necrosectomy or pancreatectomy and an open cholecystectomy. She has also had a hysterectomy. She has a complex abdominal wound and probable ventral hernia. I have recommended admission to the hospital and nasogastric tube decompression and serial examinations. The rationale for this has been discussed with her and her family friend. I will also consult [facility] to assist in medical management with her medications because of her congestive heart failure and diabetes. She was in agreement with this plan.   ____________________________ Adah Salvageichard E. Excell Seltzerooper, MD rec:OSi D: 04/26/2013 06:39:13 ET T: 04/26/2013 07:15:17 ET JOB#: [id number]  cc: Adah Salvageichard E. Excell Seltzerooper, MD, <Dictator> Lattie HawICHARD E Braylen Denunzio MD ELECTRONICALLY SIGNED 05/01/2013 14:37

## 2014-12-12 NOTE — Discharge Summary (Signed)
PATIENT NAME:  [name], [name] MR#:  [id number] DATE OF BIRTH:  [birth year]  DATE OF ADMISSION:  11/19/2012 DATE OF DISCHARGE:  11/23/2012  REASON FOR ADMISSION: Shortness of breath and cough.   DISCHARGE DIAGNOSES:  1.  Acute exacerbation of congestive heart failure.  2.  Acute bronchitis.  3.  Acute kidney injury, now improved.  4.  Chronic kidney disease.  5.  Insulin-dependent diabetes.  6.  Hypertension.  7.  Chronic abdominal wound.  8.  Thrombocytopenia.   DISPOSITION: Home with resumption of home health.   MEDICATIONS:  1.  Omeprazole 20 mg once a day. 2.  Buspirone 10 mg twice daily.  3.  Acetaminophen with hydrocodone one 3 times a day p.r.n. pain. 4.  Carbidopa/levodopa one 3 times a day of the formulation 25/100 mg.  5.  Alprazolam 1 mg tablets, take 1/2 tablet twice daily and 1 at bedtime.  6.  Metoprolol 200 mg tablets once a day. 7.  Pravastatin 20 mg once a day. 8.  Zenpep 1 capsule 3 times a day. 9.  Levothyroxine 75 mg once a day. 10.  Lantus 25 units subcutaneously at bedtime. 11.  Humalog sliding scale. 12.  Prednisone 10 mg once a day for 2 days. 13.  Lisinopril 20 mg twice daily. Hold until cleared by kidney doctor.  14.  Furosemide 20 mg once daily. Hold until cleared by kidney doctor.  15.  Levofloxacin 750 every 48 hours for 6 days.   FOLLOWUP: Appointments with Dr. Tillman Abide and Dr. Wynelle Link (renal). Appointments for followup within 1 to 2 weeks with Dr. Alphonsus Sias and 5 days with Dr. Wynelle Link.  HOSPITAL COURSE: The patient is a very nice 81 year old female who has a history of necrotizing pancreatitis, status post pancreatectomy, chronic abdominal wound, hypertension, congestive heart failure with diastolic dysfunction and an ejection fraction of 55%, history of gastric varices, splenic vein thrombosis and insulin-dependent diabetes. The patient was admitted on 11/19/2012 with increased shortness of breath. The patient reported that Saturday through  Thursday for 4 days she was having diarrhea, would resolve, for what she stopped taking her Lasix as she was feeling dehydrated. In the meantime, she gained 1 to 2 pounds as she was not taking her diuretics. She started taking it back again, but she was really short of breath and was starting to get worse, for what she decided to come to the ER. In the ER, she was also with significant cough with phlegm. Chest x-ray revealed obliterated costophrenic angles with haziness of the lower lobes. IV Lasix was given. The patient was admitted. On admission, her vital signs were stable. Her temperature was 98.7, pulse 78. Respirations were fast at 20 and she needed 2 liters of oxygen. She was admitted with a diagnosis of acute exacerbation of congestive heart failure.  1.  Acute exacerbation of congestive heart: The patient had not been taking her Lasix due to diarrhea. Lasix was given 20 mg IV every 12 hours. Beta blocker and ACE inhibitor were continued. The patient responded very well. She was able to get off the oxygen nasal cannula the following day.  2.  Her creatinine was 1.1 admission and it started to bump up to 1.5, 1.8, and at discharge it is 1.35. We asked for assistance by Dr. Cherylann Ratel. (Dictation Anomaly). The Lasix was held, the ACE inhibitor was held, and the patient is to follow up with renal to see when she can start taking those medications.  3.  As far as her  acute bronchitis, the patient was treated with Levaquin and the sputum is starting to turn a little bit clear. She is going to continue treatment with Levaquin to complete a course of 10 days, as the patient was really symptomatic.  4.  The patient also has an abdominal wound due to necrotizing pancreatitis in the past. The patient needs to continue to follow up with wound care. There were no signs of infection of this wound during this hospital stay. 5.  As far as her diabetes, the patient is to continue her Lantus. Her diabetes was not out of  control in this hospitalization and remained stable. 6.  Hypertension improved after diuresis but for now, she is going to hold her ACE inhibitor and Lasix, although it needs to be monitored closely.  7.  The patient developed some thrombocytopenia, which was presumptively due to the acute bronchitis. At this moment, it is stable.  8.  Her echocardiogram, by the way, showed a normal study with ejection fraction of 55% to 60% and a pattern of left ventricle diastolic filling.  DISCHARGE CONDITION: The patient was discharged in good condition.   TIME SPENT: I spent about 40 minutes with this discharge on the date of discharge.  ____________________________ Felipa Furnaceoberto Sanchez Gutierrez, MD rsg:jm D: 11/24/2012 07:51:54 ET T: 11/24/2012 13:54:28 ET JOB#: [id number]  cc: Felipa Furnaceoberto Sanchez Gutierrez, MD, <Dictator> Karie Schwalbeichard I. Letvak, MD Laverda SorensonSarath C. Kolluru, MD Pearletha FurlOBERTO SANCHEZ GUTIERRE MD ELECTRONICALLY SIGNED 11/29/2012 22:36

## 2014-12-12 NOTE — H&P (Signed)
[name] NAME:  [name], [name] MR#:  [id number] DATE OF BIRTH:  1929/03/26  DATE OF ADMISSION:  11/19/2012  REFERRING PHYSICIAN: Dr. Darnelle Catalan.   PRIMARY CARE PHYSICIAN: Dr. Alphonsus Sias.   CHIEF COMPLAINT: Shortness of breath and cough.   HISTORY OF PRESENT ILLNESS: [name] is an 81 year old, pleasant, Caucasian female with a past medical history of insulin-dependent diabetes mellitus, chronic abdominal wound, hypertension, diastolic congestive heart failure with an ejection fraction of 55% in [name] year 2011, splenic vein thrombosis, history of gastric varices. She is presenting to [name] ER with a chief complaint of shortness of breath, hypoxia and cough. [name] has reported that since last Sunday through Thursday, for a period of 4 days, she was having diarrhea which resolved last Thursday. As [name] was having diarrhea, she stopped taking Lasix as she was dehydrated. She has gained 1 to 2 pounds of weight yesterday as she was not taking her Lasix, and then [name] restarted taking her Lasix last night. Since last Thursday, [name] started having shortness of breath which has been slowly getting worse. Associated with cough with clear phlegm. As she could not breathe, [name] came into [name] ER. [name] denies any chest pain. [name] diarrhea completely resolved last Thursday. In [name] ER, [name]'s BNP is slightly elevated. Chest x-ray has revealed obliterated CP angles with haziness in [name] lower lobes. [name] was given IV antibiotics as well as Lasix IV, and hospitalist team is called to admit [name] for CHF exacerbation. During my examination, [name] started feeling better as she has received some Lasix and antibiotics. Her daughter is at [name] bedside. [name] lives alone and uses 2 liters of oxygen via nasal cannula during nighttime.    PAST MEDICAL HISTORY: History of necrotizing pancreatitis, status post pancreatectomy, chronic abdominal wound, hypertension,  congestive heart failure with diastolic dysfunction and ejection fraction was 55% in [name] year 2011, history of gastric varices, splenic vein thrombosis, insulin-dependent diabetes mellitus.   PAST SURGICAL HISTORY: Pancreatectomy, cholecystectomy, kyphoplasty, chest tube placement in lung in [facility] for a parapneumonic effusion, right eye cataract surgery.   ALLERGIES: BENADRYL, MORPHINE, REGLAN AND TAPE.   PSYCHOSOCIAL HISTORY: Lives alone. No history of smoking, alcohol or illicit drug usage. [name] gets wound care regarding chronic abdominal wound, and [name] goes to wound clinic every 2 to 3 weeks.   FAMILY HISTORY: Both mom and dad had a history of heart problems.   HOME MEDICATIONS: omeprazole 20 mg once daily, metoprolol succinate 200 mg once daily, lisinopril 20 mg 2 times a day, levothyroxine 75 mcg once daily, Lantus 25 units subcutaneously at bedtime, Humalog sliding scale, furosemide 20 mg once daily, carbidopa/levodopa 1 tablet 3 times a day, BuSpar 1 tablet 2 times a day, alprazolam 0.5 mg 2 times a day and at bedtime, Tylenol 1 tablet p.o. q.8 hours as needed.   REVIEW OF SYSTEMS:  CONSTITUTIONAL: Denies any fever, fatigue, weakness. Complaining of 1 to 2 pound weight gain yesterday.  EYES: No blurry vision, glaucoma, cataracts.  ENT: Denies any tinnitus, ear pain, snoring, postnasal drip, difficulty in swallowing, redness of [name] oropharynx.  RESPIRATORY: Complaining of cough with clear phlegm. Positive shortness of breath. Has chronic history of COPD. Denies any asthma.  CARDIOVASCULAR: No chest pain, palpitations, syncope, orthopnea.  GASTROINTESTINAL: No nausea, vomiting, diarrhea. Denies any abdominal pain, hematemesis or melena.  GENITOURINARY: No dysuria or hematuria.  GYNECOLOGIC AND BREASTS: No breast mass or vaginal discharge.  ENDOCRINE: No  polyuria, nocturia, thyroid problems.  HEMATOLOGIC AND LYMPHATIC: No anemia, easy bruising or bleeding.   MUSCULOSKELETAL: No pain in [name] neck, back, shoulder or knee.   INTEGUMENTARY: Has chronic abdominal wound and follows with [name] wound clinic but denies any acne, rash.  NEUROLOGIC: No vertigo, ataxia, dementia. No history of seizures.  PSYCHIATRIC: Denies any insomnia, ADD, OCD, bipolar disorder.   PHYSICAL EXAMINATION:  VITAL SIGNS: Temperature 98.7, pulse 78, respirations 20, blood pressure 152/68, pulse ox 100% on 2 liters.  GENERAL APPEARANCE: Not in any acute distress, moderately built, obese female.  HEENT: Normocephalic, atraumatic. Pupils are equally reacting to light and accommodation. No scleral icterus. Extraocular movements are intact. No conjunctival injection. Nose is patent. Oropharynx: Moist mucous membranes. No pharyngeal exudates.  NECK: Supple. No JVD. No thyromegaly. No lymphadenopathy.  LUNGS: Positive rales and rhonchi. No anterior chest wall tenderness on palpation. No accessory muscle usage.  CARDIAC: S1, S2 normal. Regular rate and rhythm. No murmurs. No gallops. No rubs. Trace peripheral edema. Peripheral pulses are 2+.  GASTROINTESTINAL: Soft, obese. Bowel sounds are positive in all 4 quadrants. Nontender, nondistended. No masses felt. No hepatosplenomegaly. [name] is status post pancreatectomy. She has chronic abdominal wound on [name] left side of [name] abdomen which looks erythematous but according to [name] it always looks erythematous like that.  NEUROLOGIC: Awake, alert, oriented x3. Motor and sensory are grossly intact. Cranial nerves II through XII are intact. Reflexes are 2+.  EXTREMITIES: Show trace edema. No cyanosis. No clubbing. Peripheral pulses are 2+.  MUSCULOSKELETAL: Range of motion is intact in all 4 extremities.   SKIN: Normal turgor. Warm to touch. Chronic abdominal wound is erythematous which is chronic in nature. No purulent discharge is noticed. No acne. No rashes.  PSYCHIATRIC: Normal mood and affect.   LABS AND IMAGING STUDIES: Glucose is  148. BNP is elevated at 480. BUN 15, creatinine 1.17, sodium 141, potassium 3.8, chloride 110, CO2 25. GFR is 51. Calcium 8.4. LFTs are within normal range except albumin which is low at 2.8. Troponin less than 0.02. WBC 6.7, hemoglobin 13.1, hematocrit 38.3, platelet count is 99, MCV is 89. Chest x-ray has revealed COPD and cardiomegaly. Chronic bony changes and vertebroplasty changes. A 12-lead EKG: Normal sinus rhythm at 75 beats per minute. First-degree AV block at 276 ms. Nonspecific ST-T wave changes.   ASSESSMENT AND PLAN: An 81 year old Caucasian female presenting to [name] ER with a chief complaint of shortness of breath, hypoxia and cough. Will be admitted with [name] following assessment and plan.  1. Acute exacerbation of congestive heart failure which is diastolic in nature, with a previous ejection fraction of 55% in [name] year 2011. [name] plan is to admit her to telemetry. Will put her on oxygen 2 liters via nasal cannula to titrate pulse ox to 92% to 93%. Will provide her Lasix 20 mg intravenous q.12 hours. Will check daily weights. Strictly monitor ins and outs. Continue home medications, beta blocker and ACE inhibitor. Will obtain 2-dimensional echocardiogram.  2. Acute bronchitis: Will provide her intravenous levofloxacin.  3. Insulin-dependent diabetes mellitus: Will resume her home medication Lantus and provide her sliding scale insulin.  4. Hypertension: Blood pressure is elevated. Could be from [name] stress and acute exacerbation of congestive heart failure. Will resume home medications and titrate as needed.  5. Chronic abdominal wound: Will continue wound care.  6. Thrombocytopenia: Will continue close monitoring of [name] platelet count and if [name] platelet count is trending down or if [name]  shows any symptoms and signs of bleeding or bruising, will discontinue antiplatelet medications.   CODE STATUS: DO NOT RESUSCITATE.   [name] diagnosis and plan of care was discussed in detail with [name]  [name] and her daughter at bedside. They both verbalized understanding of [name] plan.   TOTAL TIME SPENT ON ADMISSION: 55 minutes.   ____________________________ Ramonita LabAruna Edson Deridder, MD ag:gb D: 11/19/2012 23:27:02 ET T: 11/20/2012 01:52:21 ET JOB#: [id number]  cc: Ramonita LabAruna Alexzandrea Normington, MD, <Dictator> Karie Schwalbeichard I. Letvak, MD Ramonita LabARUNA Davon Folta MD ELECTRONICALLY SIGNED 11/27/2012 6:35

## 2014-12-12 NOTE — H&P (Signed)
   Subjective/Chief Complaint abd pain   History of Present Illness sudden onset diffuse abd pain, sltly improved, started yesterday afternoon no flatus, no BM since yesterday no prior episode no melena , single large volume mesis   Past History PMH pancreatitis, CHF, DM PSH open chole pancreatectomy lung surgery hysterectomy, back surgery   Past Medical Health Diabetes Mellitus   Past Med/Surgical Hx:  Renal Insufficiency:   CAD:   DJD:   Renal Cyst:   Ascites:   Hepatitis A:   Diabetes:   HTN:   Emphysema:   MRSA:   back:   Right Humerus Fracture:   Pleural Effusion:   Pancreatectomy:   ERCP:   Lumpectomy:   Cholecystectomy:   ALLERGIES:  Benadryl: Other  Reglan: Unknown  Morphine: Itching, Rash  Tape: Itching, Rash  Family and Social History:  Family History Non-Contributory   Social History negative tobacco, negative ETOH   Review of Systems:  Fever/Chills No   Cough No   Abdominal Pain Yes   Diarrhea No   Constipation No   Nausea/Vomiting Yes   SOB/DOE No   Chest Pain No   Dysuria No   Tolerating Diet No   Medications/Allergies Reviewed Medications/Allergies reviewed   Physical Exam:  GEN no acute distress   HEENT pink conjunctivae   NECK supple   RESP normal resp effort  clear BS  no use of accessory muscles   CARD regular rate   ABD positive tenderness  soft  distended  excoriated midline wound, VH? minimal tenderness, no peritoneal signs   LYMPH negative neck   EXTR negative edema   SKIN normal to palpation   PSYCH alert, A+O to time, place, person, good insight   Lab Results: Hepatic:  05-Sep-14 03:40   Bilirubin, Total 0.5  Alkaline Phosphatase 121  SGPT (ALT) 22  SGOT (AST)  70  Total Protein, Serum 6.9  Albumin, Serum 3.5  Routine Chem:  05-Sep-14 03:40   Glucose, Serum  116  BUN 18  Creatinine (comp) 1.04  Sodium, Serum 141  Potassium, Serum 3.8  Chloride, Serum 105  CO2, Serum  34  Calcium  (Total), Serum 9.0  Osmolality (calc) 284  eGFR (African American)  58  eGFR (Non-African American)  50 (eGFR values <18m/min/1.73 m2 may be an indication of chronic kidney disease (CKD). Calculated eGFR is useful in patients with stable renal function. The eGFR calculation will not be reliable in acutely ill patients when serum creatinine is changing rapidly. It is not useful in  patients on dialysis. The eGFR calculation may not be applicable to patients at the low and high extremes of body sizes, pregnant women, and vegetarians.)  Anion Gap  2  Lipase  38 (Result(s) reported on 26 Apr 2013 at 04:03AM.)  Routine Hem:  05-Sep-14 03:40   WBC (CBC) 9.0  RBC (CBC) 4.80  Hemoglobin (CBC) 14.7  Hematocrit (CBC) 42.9  Platelet Count (CBC)  95 (Result(s) reported on 26 Apr 2013 at 03:58AM.)  MCV 89  MCH 30.5  MCHC 34.2  RDW 13.6    Assessment/Admission Diagnosis SBO CT rev'd admit hydrate reexamine   Electronic Signatures: CFlorene Glen(MD)  (Signed 05-Sep-14 06:33)  Authored: CHIEF COMPLAINT and HISTORY, PAST MEDICAL/SURGIAL HISTORY, ALLERGIES, FAMILY AND SOCIAL HISTORY, REVIEW OF SYSTEMS, PHYSICAL EXAM, LABS, ASSESSMENT AND PLAN   Last Updated: 05-Sep-14 06:33 by CFlorene Glen(MD)

## 2014-12-12 NOTE — Consult Note (Signed)
PATIENT NAME:  [name], [name] MR#:  [id number] DATE OF BIRTH:  09/27/1928  DATE OF CONSULTATION:  04/26/2013  REFERRING PHYSICIAN:  Dr. Excell Seltzer CONSULTING PHYSICIAN:  Shaune Pollack, MD  PRIMARY CARE PHYSICIAN:  Dr. Alphonsus Sias.  REASON FOR CONSULTATION:  Medical management.   REVIEW OF HISTORY:  The patient is an 81 year old Caucasian female with a history of necrotizing pancreatitis status post pancreatectomy, hypertension, CHF, and was admitted under Dr. Earmon Phoenix service for small bowel obstruction. Actually the patient developed abdominal pain, nausea and vomiting since last night. The patient is alert, awake, oriented. She said she had abdominal pain on the left lower quadrant at 8/10, constant, with some nausea, vomiting since last night. The patient had no bowel movement for one day and a CAT scan of her abdomen showed a small bowel obstruction. The patient was placed on n.p.o. and NGT suction. The patient denies any fever or chills. No chest pain, palpitation, orthopnea, or nocturnal dyspnea. No leg edema. The patient denies any cough, sputum or shortness of breath.   PAST MEDICAL HISTORY:  Hypertension, diabetes, diastolic dysfunction, CHF, ejection fraction 55%, splenic vein thrombosis, thrombocytopenia, gastric varices, chronic abdominal wound, a history of necrotizing pancreatitis status post pancreatectomy.    PAST SURGICAL HISTORY:  Cholecystectomy, pancreatectomy, kyphoplasty, right eye cataract surgery, chest tube placement.   SOCIAL HISTORY:  No smoking or drinking or illicit drugs.   FAMILY HISTORY:  The parents had heart disease.   REVIEW OF SYSTEMS:  CONSTITUTIONAL:  The patient denies any fever or chills. No headache or dizziness. No weakness.  EYES:  No double vision or blurred vision.  ENT:  No problems with slow speech or dysphagia.  CARDIOVASCULAR:  No chest pain, palpitation, orthopnea, or nocturnal dyspnea. No leg edema.  PULMONARY:  No cough, sputum, shortness of breath or  hemoptysis.  GASTROINTESTINAL:  Positive for abdominal pain, nausea, vomiting, no diarrhea but has constipation. No melena or bloody stool.  GENITOURINARY:  No dysuria, hematuria, or incontinence.  SKIN:  No rash or jaundice.  NEUROLOGY:  No syncope, loss of consciousness or seizure.  HEMATOLOGY:  No easy bruising, bleeding.  ENDOCRINOLOGY:  No polyuria, polydipsia, heat or cold intolerance.   PHYSICAL EXAMINATION:  VITAL SIGNS:  Temperature 99.5, blood pressure 169/84, pulse 71, O2 saturation 96% on room air.  GENERAL:  Alert, awake, oriented, in no acute distress.  HEENT:  Pupils round, equal and reactive to light and accommodation.  NECK:  Supple. No JVD or carotid bruits. No lymphadenopathy. No thyromegaly. Moist oral mucosa. Clear oropharynx.  CARDIOVASCULAR:  S1, S2 regular rate and rhythm. No murmurs or gallops.  PULMONARY:  Bilateral air entry. No wheezing or rales. No use of accessory muscle to breathe.  ABDOMEN:  Large surgical scar in the middle of the abdomen. No distention or obvious abdominal tenderness, increase of bowel sounds. No organomegaly.  EXTREMITIES:  No edema, clubbing or cyanosis. No calf tenderness. Bilateral pedal pulses present.  SKIN:  No rash or jaundice.  NEUROLOGIC:  A and O x 3. No focal deficit. Power 4/5 bilateral legs. Sensation intact.   LABORATORY, DIAGNOSTIC AND RADIOLOGICAL DATA:  Urinalysis showed WBC 10, nitrite is negative. Bacteria 3+, glucose 137. CAT scan of abdomen and pelvis showed large ventral abdominal wall hernia with herniation of the small bowel and a small bowel obstruction. EKG shows sinus rhythm at 65 BPM with the first degree AV block and prolonged QT.  CBC normal and glucose 116, BUN 18, creatinine 1.04, sodium 141, potassium  3.8, chloride 105, bicarb 34. Lipase 38.   IMPRESSIONS:  1.  Small bowel obstruction with abdominal hernia.  2.  Hypertension, uncontrolled.  3.  Congestive heart failure, diastolic dysfunction with ejection  fraction 55%, stable.  4.  Diabetes.  5.  Thrombocytopenia.  6.  Obesity.  7.  A history of chronic kidney disease, but normal BUN and creatinine.   RECOMMENDATIONS:  1.  Now the patient is on n.p.o. with NG-T suction status. The patient needs IV fluid support and follow up BMP, magnesium level. The patient may need TPN later if the patient's symptoms do not improve.  2.  For hypertension, we will hold the patient's home p.o. medications and start Lopressor IV q.12 hours and adjust the dose depending on patient's blood pressure.  3.  The patient has a history of congestive heart failure, diastolic dysfunction. We will continue Lasix 20 mg IV, but decrease the IV fluid to 75 mL per hour.  4.  For diabetes, we will hold the Lantus, continue sliding scale.  5.  Gastrointestinal prophylaxis with Protonix IV and deep vein thrombosis prophylaxis with heparin subcutaneous.   CODE STATUS:  The patient wants DO NOT RESUSCITATE.    I discussed the patient's condition and the recommendations with the patient's nurse.   TIME SPENT:  About 58 minutes.   ____________________________ Shaune PollackQing Aften Lipsey, MD qc:jm D: 04/26/2013 13:52:53 ET T: 04/26/2013 14:45:38 ET JOB#: [id number]  cc: Shaune PollackQing Oceana Walthall, MD, <Dictator> Shaune PollackQING Dimonique Bourdeau MD ELECTRONICALLY SIGNED 04/27/2013 14:32

## 2014-12-13 ENCOUNTER — Other Ambulatory Visit: Payer: Self-pay | Admitting: Internal Medicine

## 2014-12-13 NOTE — H&P (Signed)
PATIENT NAME:  [name], [name] MR#:  [id number] DATE OF BIRTH:  Sep 25, 1928  DATE OF ADMISSION:  01/13/2014  REFERRING PHYSICIAN: Dr. Dolores FrameSung.   PRIMARY CARE PHYSICIAN: Dr. Dan HumphreysWalker.   CHIEF COMPLAINT: Headache, nausea.   HISTORY OF PRESENT ILLNESS:  An 81 year old Caucasian female with past medical history of congestive heart failure, diastolic ejection fraction of 04%55%, diabetes, and surgical intervention as well as NASH presenting with headache and nausea. She describes 2-day duration of headache and nausea. She was actually evaluated in the Emergency Department yesterday for the same symptoms and discharged. Felt that her symptoms have worsened thus re-presented to the hospital for further workup and evaluation. She describes her headache as throbbing, 4 to 5 out of 10 in intensity, nonradiating, no worsening or relieving factors. No change in vision. No change with bright light or loud noise. She attests to having associated nausea and decreased [name]. intake as well as generalized weakness and fatigue. Currently, her headache has actually resolved in the Emergency Department.   REVIEW OF SYSTEMS:  CONSTITUTIONAL: Denies fever. Positive for fatigue, weakness.  EYES: Denied blurred vision, double vision, eye pain. EARS, NOSE, AND THROAT:  Denies ear pain, hearing loss.   RESPIRATORY: No cough, shortness of breath.  CARDIOVASCULAR: Denies chest pain, palpitations, edema.  GASTROINTESTINAL:  Positive for nausea. Denies vomiting, diarrhea, abdominal pain.  GENITOURINARY:  Denies dysuria or hematuria.  ENDOCRINE: Denies nocturia or thyroid problems.  HEMATOLOGY AND LYMPHATIC:  Denies easy bruising or bleeding.  SKIN: Denies rash or lesion.  MUSCULOSKELETAL: Denies pain in neck,shoulder, knee,  hips or arthritic symptoms.  NEUROLOGIC: Denies paralysis, paresthesias.  PSYCHIATRIC: Denies anxiety or depressive symptoms.   A full review of systems performed by me is negative.   PAST MEDICAL HISTORY:  Hypertension, diabetes, congestive heart failure, diastolic NASH, osteoarthritis, apparently gastroparesis as well as pancreatic surgery of unclear type.   SOCIAL HISTORY: Denies any alcohol, tobacco, or drug use.   FAMILY HISTORY: Positive for coronary artery disease. Of note, she is also on 2 liters supplemental O2 at baseline.   ALLERGIES: BENADRYL, REGLAN, AND TAPE.   HOME MEDICATIONS: Acetaminophen hydrocodone 325/5 mg [name]. b.i.d. as needed for pain, aspirin 1 mg [name]. daily, Fioricet once every 4-6 hours as needed for headache, lisinopril 20 mg [name]. b.i.d., Humalog 5-17 units 3 times daily before meals, Lantus 27 units subcutaneous at bedtime, Zofran 4 mg oral as needed for nausea, vomiting. Pravastatin 20 mg [name]. at bedtime, carbidopa levodopa 25/100 mg [name]. 3 times daily, Requip 0.25 mg [name]. at bedtime, Plavix 75 mg [name]. daily, alprazolam 0.5 mg [name]. 3 times daily as needed for anxiety, buspirone 10 mg [name]. b.i.d., metoprolol 100 mg [name]. daily, Norvasc 2.5 mg [name]. daily, Zenpep 5000/17,000/27,000 units 3 capsules 3 times daily with meals, Lasix 20 mg [name]. daily, multivitamin 1 tablet [name]. daily.   PHYSICAL EXAMINATION:  VITAL SIGNS: Temperature 99.1, heart rate 80, respirations 16, blood pressure 160/70, saturating 96% on room air. Weight 69.9 kg, BMI of 31.1.  GENERAL: Well-nourished, well-developed Caucasian female, currently in no acute distress.  HEAD: Normocephalic, atraumatic.  EYES: Pupils equal, round, reactive to light. Extraocular muscles intact. No scleral icterus.  MOUTH: Moist mucous membranes. Dentition intact. No abscess noted.  EARS, NOSE, AND THROAT: Clear without exudates. No external lesions.  NECK: Supple. No thyromegaly. No nodules. No JVD.  PULMONARY: Clear to auscultation bilaterally without wheezes, rubs, or rhonchi.  However, grossly diminished breath sounds throughout all lung fields. No use of accessory muscles.  Poor respiratory effort.  CHEST: Nontender to  palpation.  CARDIOVASCULAR: S1, S2, regular rate and rhythm. No murmurs, rubs, or gallops. No edema. Pedal pulses 2+ bilaterally.  GASTROINTESTINAL: Soft, nontender, nondistended. No masses. Positive bowel sounds. No hepatosplenomegaly.  MUSCULOSKELETAL: No swelling, clubbing, or edema. Range of motion full in all extremities.  NEUROLOGIC: Cranial nerves II through XII intact. No gross focal neurological deficits. Sensation intact.  Reflexes intact.  SKIN: No ulcerations, lesions, rashes, cyanosis. Skin warm, dry. Turgor intact.  PSYCHIATRIC: Mood and affect within normal limits.  Patient awake, alert, oriented x 3. Insight and judgment intact.   LABORATORY DATA: Sodium 136, potassium 5.2, chloride 102, bicarbonate 31, BUN 28, creatinine 1.32, glucose 241. LFTs: Albumin 3.2, otherwise within normal limits. WBC 12.8, hemoglobin 13.3, platelets of 91,000.   RADIOLOGICAL DATA:  CT head performed revealing no acute intracranial process. Chest x-ray performed revealing cardiomegaly without pulmonary edema.   ASSESSMENT AND PLAN: An 81 year old Caucasian female with history of congestive heart failure, diastolic diabetes, presenting with headache and nauseousness. 1. Acute kidney injury, likely prerenal given history, as well as her Lasix and poor [name]. intake. Gentle IV fluid hydration. Follow renal function and urine output.  2. Diabetes. Continue insulin sliding scale with Lantus.  3. Headache.  Symptoms are actually resolved at this time. Continue Fioricet as needed.  4. Hypertension. Continue Norvasc and metoprolol.  5. Venous thromboembolism prophylaxis with heparin subcutaneously.   CODE STATUS:  The patient is full code.   TIME SPENT: 45 minutes.    ____________________________ Cletis Athens. Omare Bilotta, MD dkh:dd D: 01/13/2014 00:39:30 ET T: 01/13/2014 03:39:45 ET JOB#: [id number]  cc: Cletis Athens. Yomar Mejorado, MD, <Dictator> Emeree Mahler Synetta Shadow MD ELECTRONICALLY SIGNED 01/13/2014 20:28

## 2014-12-13 NOTE — Op Note (Signed)
PATIENT NAME:  [name], [name] MR#:  [id number] DATE OF BIRTH:  04-11-1929  DATE OF PROCEDURE:  12/09/2013  PREOPERATIVE DIAGNOSIS:  Cataract, left eye.    POSTOPERATIVE DIAGNOSIS:  Cataract, left eye.  PROCEDURE PERFORMED:  Extracapsular cataract extraction using phacoemulsification with placement of an Alcon [id number], 21.0-diopter posterior chamber lens, serial [id number].  SURGEON:  Maylon PeppersSteven A. Delton Stelle, MD  ASSISTANT:  None.  ANESTHESIA:  4% lidocaine and 0.75% Marcaine in a 50/50 mixture with 10 units/mL of Hylenex added, given as a peribulbar.   ANESTHESIOLOGIST:  Dr. Maisie Fushomas  COMPLICATIONS:  None.  ESTIMATED BLOOD LOSS:  Less than 1 ml.  DESCRIPTION OF PROCEDURE:  The patient was brought to the operating room and given a peribulbar block.  The patient was then prepped and draped in the usual fashion.  The vertical rectus muscles were imbricated using 5-0 silk sutures.  These sutures were then clamped to the sterile drapes as bridle sutures.  A limbal peritomy was performed extending two clock hours and hemostasis was obtained with cautery.  A partial thickness scleral groove was made at the surgical limbus and dissected anteriorly in a lamellar dissection using an Alcon crescent knife.  The anterior chamber was entered supero-temporally with a Superblade and through the lamellar dissection with a 2.6 mm keratome.  DisCoVisc was used to replace the aqueous and a continuous tear capsulorrhexis was carried out.  Hydrodissection and hydrodelineation were carried out with balanced salt and a 27 gauge canula.  The nucleus was rotated to confirm the effectiveness of the hydrodissection.  Phacoemulsification was carried out using a divide-and-conquer technique.  Total ultrasound time was 1 minute and 26 seconds with an average power of 22.9 percent and CDE of 31.65.  Irrigation/aspiration was used to remove the residual cortex.  DisCoVisc was used to inflate the capsule and the internal  incision was enlarged to 3 mm with the crescent knife.  The intraocular lens was folded and inserted into the capsular bag using the AcrySert delivery system. Irrigation/aspiration was used to remove the residual DisCoVisc.  Miostat was injected into the anterior chamber through the paracentesis track to inflate the anterior chamber and induce miosis. A tenth of a milliliter of cefuroxime was injected via the paracentesis tract containing 1 mg of drug. The wound was checked for leaks and none were found. The conjunctiva was closed with cautery and the bridle sutures were removed.  Two drops of 0.3% Vigamox were placed on the eye.   An eye shield was placed on the eye.  The patient was discharged to the recovery room in good condition.  ____________________________ Maylon PeppersSteven A. Dorotea Hand, MD sad:sb D: 12/09/2013 12:58:44 ET T: 12/09/2013 13:27:27 ET JOB#: [id number]  cc: Viviann SpareSteven A. Rosealee Recinos, MD, <Dictator> Erline LevineSTEVEN A Deshanda Molitor MD ELECTRONICALLY SIGNED 12/16/2013 13:09

## 2014-12-13 NOTE — Consult Note (Signed)
Brief Consult Note: Diagnosis: Chronic left rotator cuff tear.   Patient was seen by consultant.   Orders entered.   Comments: 81 year old female admitted for right sided weakness consistent with mild CVA.  Has multiple medical problems as well.  Left shoulder also painful so MRI was ordered.  This shows a large, chronic, retracted left rotator cuff tear. This may not be repairable but she is not a surgical candidate as this point anyway.  Daughter present for exam as well.  Exam:  Good range of motion of shoulder with fair strength.  Not  much crepitus or pain. circulation/sensation/motor function good distally on left.    X-rays: as above  Imp: chronic left rotator cuff tear  Rx; symptomatic care/  ice or heat, sling as needed       may follow-up in my office 7-10 days as needed to consider injection.  Electronic Signatures: Valinda HoarMiller, Nishka Heide E (MD)  (Signed 09-Dec-15 18:19)  Authored: Brief Consult Note   Last Updated: 09-Dec-15 18:19 by Valinda HoarMiller, Ryon Layton E (MD)

## 2014-12-13 NOTE — H&P (Signed)
PATIENT NAME:  [name], [name] MR#:  [id number] DATE OF BIRTH:  Nov 03, 1928  DATE OF ADMISSION:  08/26/2013  ADMITTING PHYSICIAN: Enid Baas, MD  PRIMARY CARE PHYSICIAN: Ginette Pitman. [name], MD  PRIMARY CARDIOLOGIST: Chelsea Aus. Kirke Corin, MD  PRIMARY NEPHROLOGIST: Laverda Sorenson, MD  CHIEF COMPLAINT: Difficulty breathing.   HISTORY OF PRESENT ILLNESS: Ms. 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 The patient uses only nocturnal oxygen at 2 liters but she has not been using like she is supposed to. However, over the last 4 days, she has noticed that she has been dyspneic even with minimal activities at home. She denies any orthopnea or PND. No palpitations or syncope. Denies any chest pain. She woke up this morning, was significantly in distress with tachypnea and used her oxygen, felt a little better and call EMS and was brought to the ER.  She lives at home by herself and has a nursing aide who comes in and checks on her. The patient has some cold chills and also a cough with mucoid phlegm but no fevers or chills, no sick contact exposure.   PAST MEDICAL HISTORY: 1.  Hypertension.  2.  Diabetes mellitus.  3.  Diastolic CHF with EF greater than 55%.   4.  History of splenic vein thrombosis. 5.  Chronic thrombocytopenia with nonalcoholic liver cirrhosis.  6.  Gastroparesis. 7.  Osteoarthritis.   8.  Anxiety.  PAST SURGICAL HISTORY: 1.  Cholecystectomy.  2.  Pancreatectomy.  3.  Kyphoplasty.  4.  Right eye cataract surgery.  5.  History of empyema with chest tube placement in the past.   ALLERGIES TO  MEDICATIONS: BENADRYL, REGLAN AND TAPE.   CURRENT HOME MEDICATIONS:  1.  Norco 5/325 mg 1 tablet 3 times a day as needed for pain.  2.  Xanax 1 mg at bedtime, 0.5 mg in the morning and 0.5 mg every afternoon.  3.  Amlodipine 2.5 mg once a day.  4.  Sinemet 25/100 mg 1 tablet 3 times a day.  5.  Clonidine 0.1 mg twice a day.  6.  Flonase nasal spray 1 spray in each nostril once a day.  7.  Humalog sliding scale insulin before meals 3 times a day.  8.  Lantus 27 units subcu at bedtime.  9.  Levothyroxine 75 mcg p.o. daily.  10.  Lisinopril 20 mg p.o. b.i.d.  11.  Metoprolol succinate 100 mg p.o. daily.  12.  Omeprazole 20 mg p.o. daily.  13.  Pravastatin 20 mg p.o. at bedtime.  14.  Zenpep 5000U-17000U-27000U oral delayed-release capsule 3 tablets times a day with meals.   SOCIAL HISTORY: Lives at home by herself, has a nurse aide come in to check on her. No history of any smoking or alcohol use.   FAMILY HISTORY: Both parents with heart disease.   REVIEW OF SYSTEMS: CONSTITUTIONAL: No fever. Positive for cold chills. No fatigue or weakness.  EYES: Positive for blurred vision and also left eye cataract still present. No inflammation or glaucoma.  ENT: Positive for hearing loss. No tinnitus, ear pain, epistaxis or discharge. No dysphagia.  RESPIRATORY: PSA for cough. No wheeze, hemoptysis or  COPD.   CARDIOVASCULAR: No chest pain, orthopnea, edema, arrhythmia, palpitations or syncope.  GASTROINTESTINAL: No nausea, vomiting, abdominal pain, hematemesis or melena.  GENITOURINARY: No dysuria, hematuria or renal calculus. Positive for incontinence.  ENDOCRINE: No polyuria, nocturia, thyroid problems, heat or cold intolerance.  HEMATOLOGY: No anemia, easy bruising or bleeding.  SKIN: No acne, rash or lesions.  MUSCULOSKELETAL: Positive for arthritis, especially left knee pain.  NEUROLOGIC: No numbness, weakness, CVA, TIA or seizures.  PSYCHOLOGICAL: No anxiety, insomnia, depression.    PHYSICAL EXAMINATION:  VITAL SIGNS:  Temperature 98.1 degrees Fahrenheit, pulse 71, respirations 21, blood pressure 219/108, pulse ox 95% on 2 liters oxygen.  GENERAL:  Well-built, well-nourished female lying in bed, not in any acute distress at this time.  HEENT: Normocephalic, atraumatic. Pupils equal, round, reacting to light. Anicteric sclerae. Extraocular movements intact. Oropharynx clear without erythema, mass or exudates.  NECK:  Supple.  No thyromegaly, JVD or carotid bruits. No lymphadenopathy.  LUNGS: Moving air bilaterally. Fine bibasilar crackles. No wheezing, no use of accessory muscles for breathing at this time.  CARDIOVASCULAR: S1, S2 regular rate and rhythm, a 3/6 systolic murmur heard. No rubs or gallops.  ABDOMEN: Soft, nontender, nondistended. No hepatosplenomegaly. Normal bowel sounds. There is an abdominal wound supraumbilical region which is chronic in appearance, well healed, superficial dressing in place. No purulent discharge or bleeding noted from that wound. EXTREMITIES: Has 1+ pedal edema. No clubbing or cyanosis. Feeble dorsalis pedis pulses palpable bilaterally.  SKIN: No acne, rash or lesions.  LYMPHATICS: No cervical lymphadenopathy.  NEUROLOGIC: Cranial nerves II through XII remain intact. No focal motor or sensory deficits.  PSYCHOLOGICAL: The patient is awake, alert, oriented x 3.   LABORATORY, DIAGNOSTIC AND RADIOLOGICAL DATA:  1.  WBC 5.3, hemoglobin 13.4, hematocrit 40.0, platelet count is 77.  2.  Sodium 142, potassium 3.9, chloride 106, bicarb 33, BUN 16, creatinine 0.94, glucose 103 and calcium of 8.8.  3.  BNP is 1015. CK28, CK-MB 1.1. Troponin less than 0.02.  4.  PH is 7.40.  5.  Chest x-ray showing right lower lobe infiltrate concerning for pneumonia, also tiny pleural effusion noted.   6.  CT of the chest showing no evidence of PE, small amount of left pleural fluid. No pericardial fluid. Coronary artery calcifications are present. Trachea and  main stem bronchi are noted. Right lower lobe atelectasis and some pleural thickening noted.  Multiple compression fractures are noted and multiple collateral veins in the right upper chest, appears to be narrowing of right innominate vein. Occlusion of right central veins, questionable is noted. SVC is patent though. 7.  EKG showing normal sinus rhythm, heart rate of 71.   ASSESSMENT AND PLAN: An 81 year old female with past medical history significant for hypertension, diabetes, diastolic congestive heart failure, splenic vein thrombosis, presents to the hospital secondary to difficulty breathing.  1.  Mild acute on chronic diastolic congestive heart failure exacerbation, minimal heart enlargement and some fluid noted on chest x-ray but CT chest with no fluid and bibasilar atelectasis noted. No fever, no elevated white count so will hold off on antibiotics. Continue Lasix 20 IV b.i.d. at this time. Get cardiology consult. Last echo was from 11/2012 showing ejection fraction of 55% to 60%. Her breathing has significantly improved and she is supposed to be on 2 liters oxygen at home, which she has been noncompliant with.  2.  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 Maybe she had a port in the past on the right side of her chest causing all this occlusion, no acute changes, however, vascular consult has been requested.  3.  Diabetes mellitus. Continue Lantus and sliding scale insulin for now.  4.  Hypertensive urgency, probably precipitated by anxiety and her pulmonary edema. Continue home medications for now.  5.  Chronic pancreatitis, post pancreatectomy done for necrotizing pancreatitis. Occasional diarrhea and she is also on pancreatic enzyme supplements.  6.  Gastroesophageal reflux disease.  Continue Protonix.  7.  CODE STATUS: Full code.   TIME  SPENT ON ADMISSION: Is 50 minutes.    ____________________________ Enid Baasadhika Sinthia Karabin, MD rk:cs D: 08/26/2013 19:08:35 ET T: 08/26/2013 19:53:19 ET JOB#: [id number]  cc: Enid Baasadhika Carolee Channell, MD, <Dictator> Ginette PitmanJennifer A. Dan HumphreysWalker, MD Chelsea AusMuhammad A. Kirke CorinArida, MD  Enid BaasADHIKA Dorethy Tomey MD ELECTRONICALLY SIGNED 09/03/2013 18:23

## 2014-12-13 NOTE — Consult Note (Signed)
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   Comments: given the patient's extensive past medical history she has undoubtedly had multiple central lines and likely has a venous stricture similar to those seen in hemodialysis patients.  No intervention is needed at this point.  However, should central access be required during this admission then a left sided approach would be indicated or as a second choice femoral access.  Plan is to discuss this with the patient tomorrow.  Electronic Signatures: Levora DredgeSchnier, Gregory (MD)  (Signed 06-Jan-15 19:20)  Authored: Brief Consult Note   Last Updated: 06-Jan-15 19:20 by Levora DredgeSchnier, Gregory (MD)

## 2014-12-13 NOTE — Consult Note (Signed)
Referring Physician:  Emily Filbert   Primary Care Physician:   Emily Filbert [facility], [facility] of [place], [place], [place] [address], [phone]  Reason for Consult: Admit Date: 29-Jul-2014  Chief Complaint: L arm weakness  Reason for Consult: CVA   History of Present Illness: History of Present Illness:   81 yo RHD F presents to [facility] secondary to L arm weakness.  Pt reports some mild slurred speech as well.  She is having pain in her L shoulder as well.  She denies any weakness in her L leg or facial droop.  She states that this has been progressive over the past few days.    ROS:  General denies complaints    HEENT no complaints    Lungs no complaints    Cardiac no complaints    GI no complaints    GU no complaints    Musculoskeletal no complaints    Extremities no complaints    Skin no complaints    Neuro no complaints    Endocrine no complaints    Psych no complaints    Past Medical/Surgical Hx:  Renal Insufficiency:   CAD:   DJD:   Renal Cyst:   Ascites:   Hepatitis A:   Diabetes:   HTN:   Emphysema:   MRSA:   Cataract Extraction:   back:   Right Humerus Fracture:   Pleural Effusion:   Pancreatectomy:   ERCP:   Lumpectomy:   Cholecystectomy:   Past Medical/ Surgical Hx:  Past Medical History reviewed by me as above   Past Surgical History reviewed by me as above   Home Medications: Medication Instructions Last Modified Date/Time  aspirin 81 mg oral delayed release tablet 1 tab(s) orally once a day 08-Dec-15 14:33  clopidogrel 75 mg oral tablet 1 tab(s) orally once a day 08-Dec-15 14:33  carbidopa-levodopa 25 mg-100 mg oral tablet 1 tab(s) orally 3 times a day 08-Dec-15 14:33  furosemide 40 mg oral tablet 1 tab(s) orally once a day 08-Dec-15 14:33  multivitamin 1 tab(s) orally once a day 08-Dec-15 14:33  lisinopril 20 mg oral tablet 1 tab(s) orally 2 times a day  08-Dec-15 14:33   acetaminophen-hydrocodone 325 mg-5 mg oral tablet 1-2 tab(s) orally 3 times a day, As Needed - for Pain 08-Dec-15 14:33  ALPRAZolam 1 mg oral tablet 0.5 tab(s) orally 2 times a day (in morning and afternoon 08-Dec-15 14:33  ALPRAZolam 1 mg oral tablet 1 tab(s) orally once a day (at bedtime) 08-Dec-15 14:33  Zenpep 5000 units-17,000 units-27,000 units oral delayed release capsule 3 cap(s) orally 3 times a day 08-Dec-15 14:33  omeprazole 20 mg oral delayed release capsule 1 cap(s) orally once a day 08-Dec-15 14:33  amLODIPine 5 mg oral tablet 1 tab(s) orally once a day 08-Dec-15 14:33  HumaLOG 100 units/mL subcutaneous solution 8-18 unit(s) subcutaneous 3 times a day (before meals) 08-Dec-15 14:33  Flonase 50 mcg/inh nasal spray 2 spray(s) nasal once a day 08-Dec-15 14:33  levothyroxine 75 mcg (0.075 mg) oral tablet 1 tab(s) orally once a day 08-Dec-15 14:33   Allergies:  Benadryl: Other  Reglan: Unknown  Tape: Itching, Rash  Allergies:  Allergies reglan, benadryl    Social/Family History: Employment Status: retired  Lives With: alone  Living Arrangements: apartment  Social History: no tob, no EtOH, no illicits  Family History: + strokes in F, no seizures   Physical Exam: General: overweight, NAD  HEENT: normocephalic, sclera nonicteric, oropharynx clear  Neck: supple, no JVD, no bruits  Chest: CTA B, no wheezing, good movement  Cardiac: RRR, no murmurs, no edema, 2+ pulses  Extremities: no C/C/E, FROM   Neurologic Exam: Mental Status: alert and oriented x 3, normal speech and language, follows complex commands  Cranial Nerves: PERRLA, EOMI, nl VF, face symmetric, tongue midline, shoulder shrug equal  Motor Exam: 5/5 B normal, tone, no tremor  Deep Tendon Reflexes: 1+/4 B, Plantars downgoing  Sensory Exam: decreased vibration below waist, nl temp and pin  Coordination: FTN and HTS WNL   Lab Results:  Hepatic:  08-Dec-15 12:00   Bilirubin, Total 0.4  Alkaline Phosphatase  92 (46-116 NOTE: New Reference Range 03/11/14)  SGPT (ALT) 15 (14-63 NOTE: New Reference Range 03/11/14)  SGOT (AST)  14  Total Protein, Serum 6.7  Albumin, Serum  3.2  Routine Chem:  08-Dec-15 12:00   Glucose, Serum  182  BUN  19  Creatinine (comp) 1.19  Sodium, Serum 142  Potassium, Serum 4.2  Chloride, Serum 107  CO2, Serum 30  Calcium (Total), Serum 8.7  Osmolality (calc) 290  eGFR (African American)  56  eGFR (Non-African American)  46 (eGFR values <41mL/min/1.73 m2 may be an indication of chronic kidney disease (CKD). Calculated eGFR, using the MRDR Study equation, is useful in  patients with stable renal function. The eGFR calculation will not be reliable in acutely ill patients when serum creatinine is changing rapidly. It is not useful in patients on dialysis. The eGFR calculation may not be applicable to patients at the low and high extremes of body sizes, pregnant women, and vegetarians.)  Anion Gap  5  Cardiac:  08-Dec-15 12:00   Troponin I < 0.02 (0.00-0.05 0.05 ng/mL or less: NEGATIVE  Repeat testing in 3-6 hrs  if clinically indicated. >0.05 ng/mL: POTENTIAL  MYOCARDIAL INJURY. Repeat  testing in 3-6 hrs if  clinically indicated. NOTE: An increase or decrease  of 30% or more on serial  testing suggests a  clinically important change)  Routine UA:  08-Dec-15 12:54   Color (UA) Straw  Clarity (UA) Clear  Glucose (UA) Negative  Bilirubin (UA) Negative  Ketones (UA) Negative  Specific Gravity (UA) 1.008  Blood (UA) Negative  pH (UA) 7.0  Protein (UA) Negative  Nitrite (UA) Negative  Leukocyte Esterase (UA) Trace (Result(s) reported on 29 Jul 2014 at 01:28PM.)  RBC (UA) 2 /HPF  WBC (UA) 7 /HPF  Bacteria (UA) 3+  Epithelial Cells (UA) <1 /HPF  Hyaline Cast (UA) 8 /LPF (Result(s) reported on 29 Jul 2014 at 01:28PM.)  Routine Hem:  08-Dec-15 12:00   WBC (CBC) 5.5  RBC (CBC) 3.91  Hemoglobin (CBC) 12.0  Hematocrit (CBC) 37.4  Platelet Count  (CBC)  91 (Result(s) reported on 29 Jul 2014 at 12:27PM.)  MCV 96  MCH 30.6  MCHC 32.0  RDW 13.6   Radiology Results: CT:    08-Dec-15 12:19, CT Head Without Contrast  CT Head Without Contrast   REASON FOR EXAM:    Weakness, right arm weakness  COMMENTS:       PROCEDURE: CT  - CT HEAD WITHOUT CONTRAST  - Jul 29 2014 12:19PM     CLINICAL DATA:  Weakness, right arm weakness, sudden onset of right  side weakness and facial droop at home when PTvisited, symptoms  resolved but patient felt she needed to be evaluated    EXAM:  CT HEAD WITHOUT CONTRAST    TECHNIQUE:  Contiguous axial images were obtained from  the base of the skull  through the vertex without intravenous contrast.  COMPARISON:01/12/2014    FINDINGS:  Atherosclerotic and physiologic intracranial calcifications.  Moderate parenchymal atrophy. Mild Patchy areas of hypoattenuation  in deep and periventricular white matter bilaterally. Negative for  acute intracranial hemorrhage, mass lesion, acute infarction,  midline shift, or mass-effect. Acute infarct may be inapparent on  noncontrast CT. Ventricles and sulci symmetric. Bone windows  demonstrate no focal lesion.     IMPRESSION:  1. Negative for bleed or other acute intracranial process.  2. Atrophy and nonspecific white matter changes as before.  Electronically Signed    By: Arne Cleveland M.D.    On: 07/29/2014 12:35         Verified By: Kandis Cocking, M.D.,   Radiology Impression: Radiology Impression: CT of head personally reviewed by me and shows moderate atrophy with trace white matter changes   Impression/Recommendations: Recommendations:   prior notes reviewed by me reviewed by me   L sided weakness-  not convinced that this is a neurologic problem as it does not follow normal neurologic pathways;  this could be an actual shoulder cuff injury;  however, pt has multiple risk factors MRI of brain and L shoulder needs echo and carotid US check  B12 and TSH with lipids continue ASA $RemoveBefo'81mg'xVMicUUCfTc$  daily will follow results  Electronic Signatures: Jamison Neighbor (MD)  (Signed 08-Dec-15 15:18)  Authored: REFERRING PHYSICIAN, Primary Care Physician, Consult, History of Present Illness, Review of Systems, PAST MEDICAL/SURGICAL HISTORY, HOME MEDICATIONS, ALLERGIES, Social/Family History, Physical Exam-, LAB RESULTS, RADIOLOGY RESULTS, Recommendations   Last Updated: 08-Dec-15 15:18 by Jamison Neighbor (MD)

## 2014-12-13 NOTE — Discharge Summary (Signed)
Dates of Admission and Diagnosis:  Date of Admission 13-Jan-2014   Date of Discharge 14-Jan-2014   Admitting Diagnosis headache, bronchitis   Final Diagnosis Headache- likely due to Htn Ac bronchitis htn DM    Chief Complaint/History of Present Illness An 81 year old Caucasian female with past medical history of congestive heart failure, diastolic ejection fraction of 55%, diabetes, and surgical intervention as well as NASH presenting with headache and nausea. She describes 2-day duration of headache and nausea. She was actually evaluated in the Emergency Department yesterday for the same symptoms and discharged. Felt that her symptoms have worsened thus re-presented to the hospital for further workup and evaluation. She describes her headache as throbbing, 4 to 5 out of 10 in intensity, nonradiating, no worsening or relieving factors. No change in vision. No change with bright light or loud noise. She attests to having associated nausea and decreased p.o. intake as well as generalized weakness and fatigue. Currently, her headache has actually resolved in the Emergency Department.   Allergies:  Benadryl: Other  Reglan: Unknown  Tape: Itching, Rash  Routine Chem:  26-May-15 04:40   Glucose, Serum  207  BUN  21  Creatinine (comp) 1.20  Sodium, Serum 140  Potassium, Serum 4.1  Chloride, Serum 106  CO2, Serum 29  Calcium (Total), Serum 8.7  Anion Gap  5  Osmolality (calc) 288  eGFR (African American)  49  eGFR (Non-African American)  42 (eGFR values <58mL/min/1.73 m2 may be an indication of chronic kidney disease (CKD). Calculated eGFR is useful in patients with stable renal function. The eGFR calculation will not be reliable in acutely ill patients when serum creatinine is changing rapidly. It is not useful in  patients on dialysis. The eGFR calculation may not be applicable to patients at the low and high extremes of body sizes, pregnant women, and vegetarians.)   Pertinent  Past History:  Pertinent Past History Hypertension, diabetes, congestive heart failure, diastolic NASH, osteoarthritis, apparently gastroparesis as well as pancreatic surgery of unclear type.   Hospital Course:  Hospital Course * Acute kidney injury, likely prerenal given history, as well as her Lasix and poor p.o. intake. Gentle IV fluid hydration.  improved to normal. * Diabetes. Continued insulin sliding scale with Lantus.  * Headache.  Symptoms are actually resolved at this time. Continue Fioricet as needed. likely it was due to Htn- as BP was 170 in ER. * Hypertension. Continue Norvasc and metoprolol. came under control. * Bronchitis- had some wheezing- started on levaquin+ nebs. given on dsicharge.   Condition on Discharge Stable   Code Status:  Code Status Full Code   DISCHARGE INSTRUCTIONS HOME MEDS:  Medication Reconciliation: Patient's Home Medications at Discharge:     Medication Instructions  zenpep 5,000 units-17,000-27,000 units units oral delayed release capsule  3 cap(s) orally 3 times a day   lisinopril 20 mg oral tablet  1 tab(s) orally 2 times a day    lantus 100 units/ml subcutaneous solution  27 unit(s) subcutaneous once a day (at bedtime)   amlodipine 2.5 mg oral tablet  1 tab(s) orally once a day   carbidopa-levodopa 25 mg-100 mg oral tablet  1 tab(s) orally 3 times a day   metoprolol succinate 100 mg oral tablet, extended release  1 tab(s) orally once a day   pravastatin 20 mg oral tablet  1 tab(s) orally once a day (at bedtime)   alprazolam 1 mg oral tablet  0.5 tab(s) orally once a day (in the morning), 0.5 tablet  every afternoon, and 1 tablet at bedtime   aspirin 81 mg oral delayed release tablet  1 tab(s) orally once a day   clopidogrel 75 mg oral tablet  1 tab(s) orally once a day   acetaminophen-hydrocodone 325 mg-5 mg oral tablet  1 tab(s) orally 2 times a day, As Needed - for Pain   multivitamin  1 tab(s) orally once a day   pancrease  3 tab(s)  orally 3 times a day   buspirone 10 mg oral tablet  1 tab(s) orally 2 times a day   requip 0.25 mg oral tablet  1 tab(s) orally once a day (at bedtime)   zofran odt 4 mg oral tablet, disintegrating  1 tab(s) orally every 4 to 6 hours- as needed    furosemide 40 mg oral tablet  1 tab(s) orally once a day   fioricet oral tablet  1 cap(s) orally every 4 to 6 hours PRN Pain, headache   levofloxacin 250 mg oral tablet  1 tab(s) orally once a day x 3 days   humalog 100 units/ml subcutaneous solution  5 unit(s) subcutaneous 3 times a day (before meals), As Needed per sliding scale    STOP TAKING THE FOLLOWING MEDICATION(S):    nitrofurantoin macrocrystals 100 mg oral capsule: 1 cap(s) orally 2 times a day  Physician's Instructions:  Home Health? Yes   [facility]  Nurse  resume home health services.   Diet Low Sodium  Carbohydrate Controlled (ADA) Diet   Activity Limitations As tolerated   Return to Work Not Applicable   Time frame for Follow Up Appointment 1-2 weeks   Other Comments routine folow ups with PMD.   Electronic Signatures: [name] (MD)  (Signed 28-May-15 21:18)  Authored: ADMISSION DATE AND DIAGNOSIS, CHIEF COMPLAINT/HPI, Allergies, PERTINENT LABS, PERTINENT PAST [place] MEDS, PATIENT INSTRUCTIONS   Last Updated: 28-May-15 21:18 by [name] (MD)

## 2014-12-13 NOTE — Discharge Summary (Signed)
PATIENT NAME:  [name], [name] MR#:  [id number] DATE OF BIRTH:  1929/01/11  DATE OF ADMISSION:  07/29/2014 DATE OF DISCHARGE:  07/31/2014  PRIMARY CARE PHYSICIAN: Ronna Polio, MD.   ADMITTING DIAGNOSES: 1. Questionable transient ischemic attack.  2. Pyuria. 3. Diabetes mellitus.  4. History of hypertension.  5. History of chronic diastolic congestive heart failure.   DISCHARGE DIAGNOSES: 1. Acute stroke in the right parietal lobe with right-sided weakness.  2. Pyuria from urinary tract infection.  3. Left shoulder pain secondary to complete tear of supraspinatus tendon of the rotator cuff.  4. Chronic diabetes mellitus.  5. Chronic hypertension.  6. Chronic diastolic congestive heart failure. 7. Hypertriglyceridemia.   CONSULTATIONS: Orthopedics, Dr. Hyacinth Meeker; neurology, Dr. Katrinka Blazing.   PROCEDURES: None.   BRIEF HISTORY AND PHYSICAL AND HOSPITAL COURSE: The patient is an 81 year old pleasant Caucasian female, came into the ED with a chief complaint of right-sided weakness. The patient had a few episodes of weakness in the right upper extremity and questionable right lower extremity for the past few days, prior to the admission. She also was having slurry speech. Please review history and physical for details. The patient came into the ED, CAT scan of the head was negative for any acute bleed or any acute intracranial process. The patient is admitted to the hospital with the possibility of TIA. The plan is to continue her aspirin and Plavix.   HOSPITAL COURSE: The patient is admitted to the possibility of a TIA. Initial CAT scan of the head is negative. The patient is continued on aspirin and Plavix. Fasting lipid panel was checked. The patient is started on a statin. The patient's triglycerides being very high, LDL was not calculated because of the cloudiness of the sample. Carotid Dopplers and echocardiogram was done, which were normal. On echocardiogram, also no source of TIA or CVA was  found. Left ventricular ejection fraction was at 60% to 65%. MRA of the brain was done, which has revealed a small area of acute infarct in the right parietal lobe, no associated hemorrhage. This was discussed with neurology, Dr. Katrinka Blazing, who has recommended to continue aspirin 81 mg, Plavix 75 mg, and pravastatin 40 mg on a daily basis. Also, he has recommended to monitor the patient on loop recorder for 90 days. Regarding this, Dr. Mariah Milling is called for loop recorder arrangements. Dr. Mariah Milling has recommended to follow up with him as an outpatient in approximately 1 week for loop recorder setup. The patient's dysarthria was resolved, but during the hospital course, still had some right-sided weakness. PT has recommended home health.   Acute cystitis with Escherichia coli, which is pansensitive. The plan is to treat her with ciprofloxacin.   Left shoulder pain. The patient was complaining of left shoulder pain. MRI of the shoulder was done, which has revealed a complete tear of the supraspinatus tendon with a 2.3 cm retraction. Consult is placed to orthopedics and Dr. Hyacinth Meeker has seen the patient. He has recommended no surgical interventions, as the tear is chronic. He has recommended symptomatic care with ice or heat, sling as needed and to follow up with him as an outpatient in 10 days. He might consider injection at that point if there is no symptomatic relief.   Hypertension. The patient's blood pressure is slightly above normal, as if she had acute stroke. The plan is to allow permissive and titrate the medications up frequently.   Diabetes mellitus. Lantus and Humalog 3 times a day with meals. The plan is  to continue that. Also, recommended to continue diabetic diet.   Chronic history of congestive heart failure. A recent echocardiogram has revealed ejection fraction at 60% to 65%. The patient is not fluid overloaded. Continue current medications and follow up with Dr. Mariah MillingGollan as an outpatient.    Hypertriglyceridemia. Triglycerides being very high, LDL was not calculated. The plan is to continue statin. Pravastatin 40 mg p.o. at bedtime and primary care physician to consider repeating LFTs as well as a lipid panel.   Thrombocytopenia. No active bleeding or bruises noticed. We have continued Lovenox subcutaneous during the hospital course. The plan is to continue the DVT prophylaxis if platelet count drops below 70,000. Hospital course was uneventful.   PHYSICAL EXAMINATION: VITAL SIGNS:  On December 10th, temperature 98.3, pulse 86 respirations 18, blood pressure fluctuating systolic between 409124 to 176, diastolic 64 to 90, pulse oximetry 94% to 98% at rest on room air. GENERAL APPEARANCE: Not in acute distress.  HEENT: Normocephalic, atraumatic. Pupils are equally reacting to light and accommodation. No conjunctival injection. No scleral icterus. Moist mucous membranes. Nares are patent, no postnasal drip. Neck is supple. No JVD. No thyromegaly. Range of motion is intact.  LUNGS: Clear to auscultation bilaterally.  No accessory muscle use and no anterior chest wall tenderness on palpation.  CARDIOVASCULAR: S1, S2 normal. Regular rate and rhythm. No murmur.  GASTROINTESTINAL: Soft. Bowel sounds are positive in all 4 quadrants. Nontender, nondistended. No hepatosplenomegaly. No masses felt.  NEUROLOGIC: Alert, awake, and oriented x 3. Speech is intact. Cranial nerves II-XII are intact. Motor and sensory: Right-sided weakness with motor being 4/5. Left shoulder is tender because of the complete tear of the supraspinatus tendon. Range of motion is limited.  SKIN: No rashes. No lesions. No bruising.  PSYCHIATRIC: Normal mood and affect.  Awake, alert, and oriented x 3.   IMAGING STUDIES: Echocardiogram: Left ventricular ejection fraction is 60 to 65%, normal global left ventricular systolic function. No source of TIA or CVA. Carotid Dopplers: No acute abnormality. No significant stenosis. MRA  of the brain without contrast: A small area of acute infarct in the right parietal lobe. No associated hemorrhage. Atrophy and mild chronic microvascular ischemic change.  MRA of the left shoulder without contrast: Complete tear of the supraspinatus tendon with 2.3 cm retraction. Mild tendinitis of the findings infraspinatus tendon. Moderate osteoarthritis of the left glenohumeral joint.   LABORATORY DATA: Glucose is at 226. On December 9th, BUN 19, creatinine normal. Sodium, potassium, and chloride are normal. GFR 51, anion gap is 3. VLDL and LDL were not calculated. HDL 34, triglycerides 462, total cholesterol 180, calcium 8.1. LFTs: AST is normal. Alkaline phosphatase is normal, total bilirubin is normal, serum albumin 3.0, total protein 6.2, ALT is 10. WBC normal. Hemoglobin 11.7, hematocrit is 36.0, platelet count is 80,000. On December 10th, the platelet count went up to 100,000. Urine culture, Escherichia coli pansensitive.   DISCHARGE MEDICATIONS: Lisinopril 20 mg p.o. b.i.d., carbidopa/levodopa 25/100 one tablet p.o. 3 times a day, aspirin 81 mg once daily, multivitamin 1 tablet p.o. once daily, Plavix 75 mg p.o. once daily, alprazolam 1 mg 1 tablet p.o. once daily at bedtime, Zenpep capsules 3 times a day, omeprazole 20 mg delayed-release 1 capsule once daily, amlodipine 5 mg once daily, Humalog subcutaneously 3 times a day before meals. Flonase 2 sprays nasally once daily, levothyroxine 75 mcg 1 tablet p.o. once daily, Pravachol 40 mg p.o. once daily at bedtime, ciprofloxacin 250 mg p.o. b.i.d., Tylenol/hydrocodone 325/5 one to  2 tablets p.o. 3 times a day as needed for moderate to severe pain, Tylenol 325 mg 2 tablets p.o. every 4 hours as needed for mild pain or temperature greater than 100.4, alprazolam 1 mg 1/2 tablet p.o. 2 times a day, Lantus 40 units subcutaneously once daily.   DISPOSITION: Discharged home with home health with physical therapy nurse and nurse aide.   DIET: Low-sodium,  low-fat, carb-controlled, regular consistency.   ACTIVITY: As tolerated, as recommended by physical therapy.  FOLLOWUP APPOINTMENTS: Follow-up with primary care physician in a week. Follow up with Dr. Kirke Corin for the loop recorder arrangement in a week. Follow up with   Neurology, Dr. Katrinka Blazing, in 4 to 6 weeks.   The plan of care was discussed in detail with the patient. She verbalized understanding of the plan.   TOTAL TIME SPENT ON THE DISCHARGE: 45 minutes    ____________________________ Ramonita Lab, MD ag:mw D: 08/06/2014 08:56:28 ET T: 08/06/2014 16:18:57 ET JOB#: [id number]  cc: Ramonita Lab, MD, <Dictator> Ramonita Lab MD ELECTRONICALLY SIGNED 08/07/2014 17:04

## 2014-12-13 NOTE — Consult Note (Signed)
81 year old female has a very large, chronic, retracted left rotator cuff tear.  Given her recent medical issues and treatment with EC ASA and Plavix, she is not a surgical candidate.  Would treatment with sling and anslgesics.  follow-up in office in 7-10 days as needed.  Electronic Signatures: Valinda HoarMiller, Paullette Mckain E (MD)  (Signed on 09-Dec-15 12:47)  Authored  Last Updated: 09-Dec-15 12:47 by Valinda HoarMiller, Yovan Leeman E (MD)

## 2014-12-13 NOTE — Consult Note (Signed)
General Aspect [name] is a 81yo female w/ PMHx s/f uncontrolled HTN, DM2 w/ neuropathy, diastolic dysfunction, hypothyroidism, chronic pancreatitis, h/o necrotizing pancreatitis s/p laparotomy, h/o splenic thrombus, chronic thrombocytopenia with NASH, gastroparesis, anxiety and obesity who was admitted to [facility] yesterday for respiratory distress.   Dr. Kirke CorinArida last saw the patient 12/23 in the office. Her BP was markedly elevated on multiple antihypertensives. She sees Dr. Thedore MinsSingh for HTN management. She had an episode of presyncope. Toprol-XL was reduced d/t bradycardia with an intemediate goal of transitioning to carvedilol in the future. She noted DOE felt to be related to dias dysfxn and deconditioning, however Lexiscan Myoview was recommended. This has not yet been performed. Echo 11/2012: EF 55-60%, mild LVH, mild diastolic dysfunction.   She reports experiencing worsening shortness of breath x 4 days. She lives at home alone, but with strong family support nearby and daily nurses visits. She ambulates by walker, but is not very active. She has had three separate rounds of antibiotics, most recently completed 1 month ago, for "chest congestion." She reports experiencing cough productive of white sputum and chills. She has noticed a slight weight gain at home. She endorses DOE/SOB. She denies PND, orthopnea, LE edema, abdominal distention. She denies chest pain. Her breathing continued to worsen, thus prompting her ED presentation.   Present Illness . In the ED, EKG revaeled NSR, no ischemic changes. Initial TnI WNL. BNP very mildly elevated at 1015. CBC- PLT 77K, otherwise WNL. BMP WNL. CXR showed a RLL infiltrate concerning for pneumonia. CT-A showed no PE, coronary artery calcifications, R central venous stenoses and aortic atherosclerosis. She was admitted by medicine and received two doses of Lasix IV. I/O - 760 mL. Weight 180->176.5 lbs. Blood cultures x 2 NGTD.  PAST MEDICAL HISTORY: 1.   Hypertension.  2.  Diabetes mellitus.  3.  Diastolic CHF with EF greater than 55%.   4.  History of splenic vein thrombosis. 5.  Chronic thrombocytopenia with nonalcoholic liver cirrhosis.  6.  Gastroparesis. 7.  Osteoarthritis.   8.  Anxiety.  PAST SURGICAL HISTORY: 1.  Cholecystectomy.  2.  Pancreatectomy.  3.  Kyphoplasty.  4.  Right eye cataract surgery.  5.  History of empyema with chest tube placement in the past.   ALLERGIES TO MEDICATIONS: BENADRYL, REGLAN AND TAPE.   SOCIAL HISTORY: Lives at home by herself, has a nurse aide come in to check on her. No history of any smoking or alcohol use.   FAMILY HISTORY: Both parents with heart disease.   Physical Exam:  GEN no acute distress, obese   HEENT pink conjunctivae, PERRL, hearing intact to voice   NECK supple  No masses  trachea midline  no JVD or bruits   RESP normal resp effort  no use of accessory muscles  diffuse rhonchi which clears with cough, scattered wheezes, reduced R basilar breath sounds, no appreciable rales   CARD Regular rate and rhythm  Normal, S1, S2  No murmur   ABD denies tenderness  soft  hypoactive BS  epigastric bandage appreciated, nontender, nonerythematous   EXTR negative cyanosis/clubbing, negative edema   SKIN normal to palpation, skin turgor good   NEURO follows commands, motor/sensory function intact   PSYCH alert, A+O to time, place, person   Review of Systems:  Subjective/Chief Complaint shortness of breath, cough   General: Fatigue  Weakness   Skin: No Complaints    ENT: No Complaints    Eyes: No Complaints    Neck: No  Complaints    Respiratory: Frequent cough  Short of breath  Sputum   Cardiovascular: No Complaints   Gastrointestinal: No Complaints    Genitourinary: No Complaints    Vascular: No Complaints    Neurologic: No Complaints    Hematologic: No Complaints    Endocrine: No Complaints    Psychiatric: No Complaints    Review of Systems: All other  systems were reviewed and found to be negative   Medications/Allergies Reviewed Medications/Allergies reviewed    Home Medications:  Medication Instructions Status  omeprazole 20 mg oral delayed release capsule 1 cap(s) orally once a day  Active  acetaminophen-hydrocodone 325 mg-5 mg oral tablet 1 tab(s) orally 3 times a day, As Needed - for Pain Active  Zenpep 5,000 units-17,000-27,000 units units oral delayed release capsule 3 cap(s) orally 3 times a day Active  levothyroxine 75 mcg (0.075 mg) oral tablet 1 tab(s) orally once a day Active  lisinopril 20 mg oral tablet 1 tab(s) orally 2 times a day  Active  HumaLOG 100 units/mL subcutaneous solution  subcutaneous 3 times a day (before meals), As Needed per sliding scale Active  Lantus 100 units/mL subcutaneous solution 27 unit(s) subcutaneous once a day (at bedtime) Active  fluticasone nasal 50 mcg/inh nasal spray 1 spray(s) nasal once a day Active  amLODIPine 2.5 mg oral tablet 1 tab(s) orally once a day Active  carbidopa-levodopa 25 mg-100 mg oral tablet 1 tab(s) orally 3 times a day Active  cloNIDine 0.1 mg oral tablet 1 tab(s) orally 2 times a day Active  metoprolol succinate 100 mg oral tablet, extended release 1 tab(s) orally once a day Active  pravastatin 20 mg oral tablet 1 tab(s) orally once a day (at bedtime) Active  ALPRAZolam 1 mg oral tablet 0.5 tab(s) orally once a day (in the morning), 0.5 tablet every afternoon, and 1 tablet at bedtime Active  levothyroxine 75 mcg (0.075 mg) oral capsule 1 cap(s) orally once a day Active  rOPINIRole 0.25 mg oral tablet  orally  Active   Lab Results:  Routine Chem:  05-Jan-15 13:09   B-Type Natriuretic Peptide [facility])  1015 (Result(s) reported on 26 Aug 2013 at 01:52PM.)  Cardiac:  05-Jan-15 13:09   CPK-MB, Serum 1.1 (Result(s) reported on 26 Aug 2013 at 01:52PM.)  Troponin I < 0.02 (0.00-0.05 0.05 ng/mL or less: NEGATIVE  Repeat testing in 3-6 hrs  if clinically indicated. >0.05  ng/mL: POTENTIAL  MYOCARDIAL INJURY. Repeat  testing in 3-6 hrs if  clinically indicated. NOTE: An increase or decrease  of 30% or more on serial  testing suggests a  clinically important change)  CK, Total 28    21:31   CPK-MB, Serum 0.8 (Result(s) reported on 26 Aug 2013 at 10:33PM.)  Troponin I < 0.02 (0.00-0.05 0.05 ng/mL or less: NEGATIVE  Repeat testing in 3-6 hrs  if clinically indicated. >0.05 ng/mL: POTENTIAL  MYOCARDIAL INJURY. Repeat  testing in 3-6 hrs if  clinically indicated. NOTE: An increase or decrease  of 30% or more on serial  testing suggests a  clinically important change)  06-Jan-15 01:09   CPK-MB, Serum 2.1 (Result(s) reported on 27 Aug 2013 at 01:50AM.)  Troponin I < 0.02 (0.00-0.05 0.05 ng/mL or less: NEGATIVE  Repeat testing in 3-6 hrs  if clinically indicated. >0.05 ng/mL: POTENTIAL  MYOCARDIAL INJURY. Repeat  testing in 3-6 hrs if  clinically indicated. NOTE: An increase or decrease  of 30% or more on serial  testing suggests a  clinically important change)  04:21   CPK-MB, Serum 0.8 (Result(s) reported on 27 Aug 2013 at 04:59AM.)  Troponin I < 0.02 (0.00-0.05 0.05 ng/mL or less: NEGATIVE  Repeat testing in 3-6 hrs  if clinically indicated. >0.05 ng/mL: POTENTIAL  MYOCARDIAL INJURY. Repeat  testing in 3-6 hrs if  clinically indicated. NOTE: An increase or decrease  of 30% or more on serial  testing suggests a  clinically important change)   EKG:  Interpretation EKG shows NSR, poor R wave progression, inferior IVCD, no ST/T changes   Rate 71   Radiology Results: XRay:    05-Jan-15 13:36, Chest PA and Lateral  Chest PA and Lateral   REASON FOR EXAM:    SOB  COMMENTS:       PROCEDURE: DXR - DXR CHEST PA (OR AP) AND LATERAL  - Aug 26 2013  1:36PM     CLINICAL DATA:  Increasing shortness of breath and cough for 1 week    EXAM:  CHEST  2 VIEW    COMPARISON:  11/09/2012    FINDINGS:  Heart size mildly enlarged but  stable. Vascular pattern within  normal limits. Blunting left costophrenic angle suggests small left  effusion. There is airspace disease in the right lower lobe, which  is new when compared to prior study.    There is an old right humeral neck fracture. There is multilevel  thoracic kyphoplasty. There is also mid thoracic spine moderate  compression deformity which is stable. Mild compression deformity at  the 2 cranial adjacent levels is also stable.     IMPRESSION:  Right lower lobe infiltrate. This is concerning for pneumonia. There  is also a tiny left pleural effusion.    Radiographic follow-up after appropriate therapy recommended.    Electronically Signed    By: Esperanza Heir M.D.    On: 08/26/2013 13:55         Verified By: Otilio Carpen, M.D.,    Benadryl: Other  Reglan: Unknown  Tape: Itching, Rash  Vital Signs/Nurse's Notes:  **Vital Signs.:   06-Jan-15 11:56  Vital Signs Type Routine  Temperature Temperature (F) 98.9  Celsius 37.1  Temperature Source oral  Pulse Pulse 63  Respirations Respirations 18  Systolic BP Systolic BP 131  Diastolic BP (mmHg) Diastolic BP (mmHg) 70  Mean BP 90  Pulse Ox % Pulse Ox % 94  Pulse Ox Activity Level  At rest  Oxygen Delivery 2L    Impression 81yo female w/ PMHx s/f uncontrolled HTN, DM2 w/ neuropathy, diastolic dysfunction, hypothyroidism, chronic pancreatitis, h/o necrotizing pancreatitis s/p laparotomy, h/o splenic thrombus, chronic thrombocytopenia with NASH, gastroparesis, anxiety and obesity who was admitted to [facility] yesterday for respiratory distress.  1. Respiratory distress Suspect this is largely attributed to RLL infiltrate on CXR yesterday. Speaking to the patient, she has undergone several rounds of antibiotics for respiratory infections. She is immunocompromised d/t age and DM2. She report cough productive of sputum and chills. There may be a small component of heart failure (see below), but she is  euvolemic on exam today.  -- Management of possible pneumonia per primary team  2. Acute on chronic diastolic CHF The patient reports progressive DOE/SOB x 4 days. Denies additional HF symptoms including PND, orthopnea, LE edema, abdominal distention. There has been slight weight increase. BNP mildly elevated at 1K yesterday. CXR showed RLL infiltrate c/w PNA, no mention of pulmonary edema. She received Lasix IV yesterday with - 700 I/O. Euvolemic on exam today with no JVD, LE edema  or abdominal distention. She has had no chest pain. Of note, CT-A does reveal CAD, no PE. Lung exam reveals diffuse rhonchi, cough and scattered wheezing.  -- Hold Lasix -- Monitor I/Os, daily weights, serial BMPs -- BP well-controlled  3. Coronary artery calcifications, aortic atherosclerosis On CT-A. Plan was for outpatient stress testing incidentally scheduled on 1/7 at 9:45 AM to r/o ischemic cause to her DOE. Will plan to continue this tomorrow. Orders placed to keep NPO past MN. Proceedw with Lexiscan Myoview in the AM at the same time slot. She is quite sedentary at home. Suspect DOE related to deconditioning, obesity on a background of diastolic dysfunction. -- Stress test tomorrow   Plan 4. Uncontrolled HTN BP much better controlled this AM.  -- If further control needed, consider switching to carvedilol, or increasing amlodipine -- Follow-up Dr. Thedore Mins to r/o renal artery stenosis (unclear if renal u/s has been performed)  5. Possible RLL pneumonnia Noted on CXR.  -- Management per primary team   Electronic Signatures: Gery Pray (PA-C)  (Signed 06-Jan-15 10:11)  Authored: General Aspect/Present Illness, History and Physical Exam, Review of System, Home Medications, Labs, EKG , Radiology, Allergies, Vital Signs/Nurse's Notes, Impression/Plan Julien Nordmann (MD)  (Signed 06-Jan-15 19:46)  Authored: General Aspect/Present Illness, Review of System, Home Medications, EKG , Vital Signs/Nurse's  Notes  Co-Signer: General Aspect/Present Illness, Home Medications, Labs, EKG , Radiology, Allergies, Vital Signs/Nurse's Notes, Impression/Plan   Last Updated: 06-Jan-15 19:46 by Julien Nordmann (MD)

## 2014-12-13 NOTE — Discharge Summary (Signed)
PATIENT NAME:  [name], [name] MR#:  [id number] DATE OF BIRTH:  12-17-1928  DATE OF ADMISSION:  08/26/2013 DATE OF DISCHARGE:  08/30/2013  For detailed note, please take a look at the history and physical done on admission by Dr. Nemiah Commander.   DIAGNOSES AT DISCHARGE: As follows: Acute on chronic respiratory failure. Pneumonia. Congestive heart failure, acute on chronic diastolic dysfunction. Coronary artery disease, status post stent placement. Hypertension. Hypothyroidism. Restless leg syndrome.   The patient is being discharged home on a low-sodium, low-fat diet.   ACTIVITY: As tolerated.  FOLLOW-UP:  With Dr. Ronna Polio in the next 1 to 2 weeks.  Also follow up Dr. Kirke Corin on 01/15 at 2:00 p.m.  DISCHARGE MEDICATIONS: Omeprazole 20 mg daily, Tylenol with hydrocodone 1 tab t.i.d. as needed, Zenpep 5000 units/17,000 units/27,000 units daily caps 3 caps t.i.d., lisinopril 20 mg b.i.d., Humalog t.i.d. with meals, Lantus 27 units at bedtime, Flonase 1 spray to each nostril daily, amlodipine 2.5 mg daily, carbidopa/levodopa 25/100 1 tab t.i.d. with meals, clonidine 0.1 mg b.i.d., metoprolol succinate 100 mg daily, Pravachol 20 mg daily, Xanax 1 mg tab 1/2 tab in the morning, 1/2 tab in the afternoon and 1 at bedtime, Synthroid 25 mcg daily, Requip 0.25 mg at bedtime, aspirin 81 mg daily, Plavix 75 mg daily, Levaquin 750 mg q. 48 hours x 10 days which is 5 doses.   CONSULTANTS DURING THE HOSPITAL COURSE:  Dr. Levora Dredge and Dr. Festus Barren from vascular surgery. Dr. Bascom Levels from cardiology.  PERTINENT STUDIES DONE DURING THE HOSPITAL COURSE: Are as follows: A CT scan of the chest done with contrast showing negative for pulmonary embolism, mild pleural thickening, chronic pneumobilia concerning for right central venous narrowing or obstruction. A chest x-ray done on admission showing right lower lobe infiltrate concerning for pneumonia. A nuclear medicine myocardial scan done on 01/07 showing  pharmacological myocardial perfusion imaging study with moderate size region of ischemia in the mid to distal anterior wall and apical region. There is wall motion abnormality noted in the mid to distal anterior wall, apical wall, EF of 69%. No EKG changes concerning with ischemia. A cardiac catheterization done on 08/29/2013 showing significant 2-vessel coronary artery disease. The LAD is chronically occluded with right-to-left collaterals. EF of 60%. The patient underwent stent placement to the obtuse marginal 2 vessel.   HOSPITAL COURSE: This is an 81 year old female who presented to the hospital with shortness of breath and acute on chronic respiratory failure.   PROBLEM: 1.  Shortness of breath. This is likely acute on chronic respiratory failure. The exact etiology of this was unclear. Questionable on admission, was thought to be related to heart failure versus underlying pneumonia. The patient was initially started on IV diuresis with Lasix, although she clinically appeared dry. A chest x-ray was consistent with pneumonia; therefore, she was started on IV antibiotics. She was scheduled for a Myoview as an outpatient, but it was done while she was in the hospital. It did show evidence of myocardial ischemia; therefore, she underwent cardiac catheterization on 01/08 which showed significant 2-vessel coronary artery disease. The patient underwent stent placement to the obtuse marginal 2. After treatment for her pneumonia and treatment for congestive heart failure and having stent placement, her shortness of breath has significantly improved. She is currently chest pain-free, hemodynamically stable, is being discharged home with home health services.  2.  Diabetes. The patient's blood sugars remain stable. She will continue her Lantus and NovoLog with meals. While in the hospital,  she was maintained on Levemir and sliding scale insulin.  3.  Parkinson disease. The patient was maintained on her Sinemet. She  will resume that. 4.  Hypertension. The patient remained hemodynamically stable on clonidine, metoprolol and lisinopril. She will resume that.  5.  Anxiety. The patient was maintained on her Xanax. She will resume that.  6.  Hypothyroidism. The patient was maintained on her Synthroid and she will resume that upon discharge too. 7.  Coronary artery disease. The patient did have a cardiac catheterization while in the hospital, which showed 2-vessel coronary artery disease. She underwent stent placement to the obtuse marginal 2 vessel. She is currently being discharged on aspirin, Plavix, beta blocker and statin as stated.  8.  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 The patient was seen by vascular surgery by Dr. Festus BarrenJason Dew who did not think that the patient had any significant stenosis. He reviewed the CT scan and thought that the patient had right subclavian and innominate vein stenosis. If patient were to be symptomatic with any arm swelling, he would see her as an outpatient. At this point, he had no new recommendations.  CODE STATUS: The patient is a FULL CODE.   DISPOSITION: She is being discharged with home health physical therapy and nursing services.   TIME SPENT: 40 minutes   ____________________________ Rolly PancakeVivek J. Cherlynn KaiserSainani, MD vjs:ce D: 08/30/2013 15:21:37 ET T: 08/30/2013 16:44:06 ET JOB#: [id number]  cc: Rolly PancakeVivek J. Cherlynn KaiserSainani, MD, <Dictator> Muhammad A. Kirke CorinArida, MD Ginette PitmanJennifer A. Dan HumphreysWalker, MD Houston SirenVIVEK J SAINANI MD ELECTRONICALLY SIGNED 09/18/2013 11:23

## 2014-12-15 ENCOUNTER — Ambulatory Visit (INDEPENDENT_AMBULATORY_CARE_PROVIDER_SITE_OTHER): Payer: Medicare Other | Admitting: *Deleted

## 2014-12-15 DIAGNOSIS — I6389 Other cerebral infarction: Secondary | ICD-10-CM

## 2014-12-15 DIAGNOSIS — I638 Other cerebral infarction: Secondary | ICD-10-CM

## 2014-12-16 ENCOUNTER — Telehealth: Payer: Self-pay | Admitting: *Deleted

## 2014-12-16 ENCOUNTER — Ambulatory Visit: Payer: Medicare Other | Admitting: Cardiovascular Disease

## 2014-12-16 ENCOUNTER — Encounter: Payer: Self-pay | Admitting: Internal Medicine

## 2014-12-16 ENCOUNTER — Ambulatory Visit (INDEPENDENT_AMBULATORY_CARE_PROVIDER_SITE_OTHER): Payer: Medicare Other | Admitting: Internal Medicine

## 2014-12-16 VITALS — BP 108/68 | HR 61 | Ht 59.0 in | Wt 176.5 lb

## 2014-12-16 DIAGNOSIS — R0602 Shortness of breath: Secondary | ICD-10-CM

## 2014-12-16 DIAGNOSIS — I638 Other cerebral infarction: Secondary | ICD-10-CM

## 2014-12-16 DIAGNOSIS — R000 Tachycardia, unspecified: Secondary | ICD-10-CM | POA: Diagnosis not present

## 2014-12-16 LAB — MDC_IDC_ENUM_SESS_TYPE_INCLINIC
MDC IDC SESS DTM: 20160426142318
MDC IDC SET ZONE DETECTION INTERVAL: 410 ms
Zone Setting Detection Interval: 2000 ms
Zone Setting Detection Interval: 3000 ms

## 2014-12-16 MED ORDER — APIXABAN 2.5 MG PO TABS: 2.5000 mg | ORAL_TABLET | Freq: Two times a day (BID) | ORAL | Status: DC

## 2014-12-16 MED ORDER — HYDROCODONE-ACETAMINOPHEN 5-325 MG PO TABS: 1.0000 | ORAL_TABLET | Freq: Three times a day (TID) | ORAL | Status: DC | PRN

## 2014-12-16 NOTE — Progress Notes (Signed)
Patient Care Team: Shelia Media, MD as PCP - General (Internal Medicine)   HPI  [name] is a 81 y.o. female Seen in follow-up for loop recorder insertion for cryptogenic stroke.  She has no recollection as to why she is here or why she had the Loop recorder inserted.  Echocardiogram 12/15 demonstrated normal left ventricular function and left atrial size  Coronary artery disease with stenting January 2015 of the second marginal  Laboratories 11/13/14 demonstrated interval significant change in renal function with a creatinine  1.0--1.8  Past Medical History  Diagnosis Date  . Anxiety   . CHF (congestive heart failure)   . Depression   . Diabetes mellitus     Followed by Dr. Tedd Sias at [facility]  . GERD (gastroesophageal reflux disease)   . Hypertension   . Hypothyroidism   . Osteoporosis   . Allergy   . Hyperlipidemia   . RLS (restless legs syndrome)   . Pancreatic disease     pancreatic failure  . Urinary incontinence   . Pancreatitis     Pancreatic resection/ open wound- [facility] 11/04  . Pneumonia     Pneumonia/ Emphysema 05/05  . 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 TIA (transient ischemic attack)     recurrent 12/15  . History of pleural empyema   . Sepsis   . Frequent falls   . Arthritis     left knee, [facility]  . CAD (coronary artery disease), native coronary artery      1//15  T mid-LAD  stent OM2  . Parkinson's disease     Past Surgical History  Procedure Laterality Date  . Breast lumpectomy      Lumpectomy right breast  . 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 Abdominal hysterectomy      Hysteroscopy/ D & C ([place]) 12/05  . Removal of pancreas  2004  . Kyphoplasty  2005  . Broken shoulder and orbital bone  2005  . Cholecystectomy  1973  . Thoracentesis    . Vaginal delivery      7  . Cardiac catheterization  08/29/2013    x1 stent @  [facility]  . Cataract surgery      Current Outpatient Prescriptions  Medication Sig Dispense Refill  . albuterol (PROVENTIL HFA;VENTOLIN HFA) 108 (90 BASE) MCG/ACT inhaler Inhale 2 puffs into the lungs every 6 (six) hours as needed for wheezing or shortness of breath. 1 Inhaler 6  . ALPRAZolam (XANAX) 1 MG tablet TAKE 1/2 TABLET IN THE MORNING TAKE 1/2 TABLET AT LUNCH AND TAKE 1 TABLET AT BEDTIME IF NEEDED 60 tablet 5  . amLODipine (NORVASC) 5 MG tablet Take 1 tablet (5 mg total) by mouth daily. 30 tablet 3  . aspirin 81 MG tablet Take 81 mg by mouth daily.      . busPIRone (BUSPAR) 10 MG tablet TAKE ONE TABLET TWICE DAILY 180 tablet 1  . Carbidopa-Levodopa ER (SINEMET CR) 25-100 MG tablet controlled release TAKE ONE (1) TABLET BY MOUTH 3 TIMES DAILY 270 tablet 1  . cetirizine (ZYRTEC) 10 MG tablet Take 10 mg by mouth daily.    . clopidogrel (PLAVIX) 75 MG tablet TAKE ONE (1) TABLET EACH DAY WITH BREAKFAST 30 tablet 6  . fluticasone (FLONASE) 50 MCG/ACT nasal spray USE 2 SPRAYS EACH NOSTRIL DAILY 16 g 3  . furosemide (LASIX) 40  MG tablet Take 1 tablet (40 mg total) by mouth daily. 90 tablet 3  . gabapentin (NEURONTIN) 100 MG capsule Take 1 capsule (100 mg total) by mouth 3 (three) times daily. 90 capsule 3  . gentamicin ointment (GARAMYCIN) 0.1 % Apply 1 application topically 3 (three) times daily. 30 g 0  . HYDROcodone-acetaminophen (NORCO/VICODIN) 5-325 MG per tablet Take 1-2 tablets by mouth 3 (three) times daily as needed for moderate pain. 120 tablet 0  . insulin glargine (LANTUS) 100 UNIT/ML injection Inject 0.45 mLs (45 Units total) into the skin at bedtime. 10 mL 6  . insulin lispro (HUMALOG) 100 UNIT/ML injection Inject 0.08-0.18 mLs (8-18 Units total) into the skin 3 (three) times daily before meals. 10 mL 6  . levothyroxine (SYNTHROID, LEVOTHROID) 75 MCG tablet TAKE ONE (1) TABLET EACH DAY 90 tablet 2  . lisinopril (PRINIVIL,ZESTRIL) 20 MG tablet TAKE ONE TABLET TWICE DAILY 180 tablet 1    . metoprolol (TOPROL-XL) 200 MG 24 hr tablet Take 1 tablet (200 mg total) by mouth daily. 90 tablet 3  . Multiple Vitamins-Minerals (CENTRUM SILVER PO) Take 1 tablet by mouth daily.      Marland Kitchen. omeprazole (PRILOSEC) 20 MG capsule TAKE ONE CAPSULE BY MOUTH DAILY 30 capsule 6  . ondansetron (ZOFRAN) 4 MG tablet Take 1 tablet (4 mg total) by mouth every 8 (eight) hours as needed. 90 tablet 11  . pravastatin (PRAVACHOL) 40 MG tablet Take 1 tablet (40 mg total) by mouth daily. 30 tablet 5  . rOPINIRole (REQUIP) 0.25 MG tablet Take 0.25 mg by mouth at bedtime.    . triamcinolone cream (KENALOG) 0.1 % Apply topically 2 (two) times daily. 30 g 0  . ULTICARE INSULIN SYRINGE 31G X 5/16" 0.5 ML MISC USE AS DIRECTED 100 each 2  . ZENPEP 15000 UNITS CPEP TAKE ONE (1) CAPSULE THREE (3) TIMES EACH DAY 100 capsule 6   No current facility-administered medications for this visit.    Allergies  Allergen Reactions  . Citalopram Hydrobromide     REACTION: nausea, dizzy, dry mouth  . Diphenhydramine     Other reaction(s): Unknown  . Diphenhydramine Hcl     REACTION: unspecified  . Metoclopramide     Other reaction(s): Unknown  . Metoclopramide Hcl     REACTION: unspecified  . Paroxetine     REACTION: hallucinations  . Tape Other (See Comments) and Rash    Review of Systems negative except from HPI and PMH  Physical Exam BP 108/68 mmHg  Pulse 61  Ht 4\' 11"  (1.499 m)  Wt 176 lb 8 oz (80.06 kg)  BMI 35.63 kg/m2 Well developed and well nourished in no acute distress HENT normal E scleral and icterus clear Neck Supple JVP flat; carotids brisk and full Clear to ausculation  Regular rate and rhythm, no murmurs gallops or rub Soft with active bowel sounds No clubbing cyanosis 1+ Edema Alert and oriented, grossly normal motor and sensory function Skin Warm and Dry  Sinus rhythm at 61 Intervals 30/08/39  Assessment and  Plan Atrial fibrillation-paroxysmal  Cryptogenic stroke  Ischemic heart  disease with stenting 12/15  Renal insufficiency grade 4  Dementia  The patient has had paroxysmal atrial fibrillation identified on her loop recorder. In the context of a cryptogenic stroke it would be appropriate to change her antiplatelet therapy--anticoagulation. Given the fact that her stent is only 4 months, I'm inclined to maintain antiplatelet therapy for now. Hence, we will discontinue her Plavix. We will continue her on  aspirin.  Renal function when last assessed one month ago had a creatinine of 1.8 which would make the appropriate dose of apixaban 2.5 mg twice daily. We will recheck her metabolic profile today.  We will have her follow-up with Dr. Kennith Maes in 3 months time  We have discussed the fact that blood thinners are put her risk for bleeding   We discussed the use of the NOACs compared to Coumadin. We briefly reviewed the data of at least comparability in stroke prevention, bleeding and outcome. We discussed some of the new once wherein somewhat associated with decreased ischemic stroke risk, one to be taken daily, and has been shown to be comparable and bleeding risk to aspirin.  We also discussed bleeding associated with warfarin as well as NOACs and a wall bleeding as a complication of all these drugs intracranial bleeding is more frequently associated with warfarin then the NOACs and a GI bleeding is more commonly associated with the latter

## 2014-12-16 NOTE — Telephone Encounter (Signed)
pts daughter,Bonnie called requesting Vicodin refill. Last OV 3.3.16 last refill #120 3.28.16.  Please advise refill

## 2014-12-16 NOTE — Telephone Encounter (Signed)
Printed, waiting for signature. 

## 2014-12-16 NOTE — Telephone Encounter (Signed)
Left message, notifying Rx ready for pickup 

## 2014-12-16 NOTE — Telephone Encounter (Signed)
Fine to refill 

## 2014-12-16 NOTE — Patient Instructions (Addendum)
Medication Instructions: 1) Start Eliquis 2.5 mg one tablet by mouth twice daily 2) Stop plavix  ** if there is a cost for Eliquis and this is too expensive, please ask your pharmacist about the cost of two other alternatives: - Pradaxa - Xarelto  Labwork: Your physician recommends that you have lab work: today- BMP  Procedures/Testing: - none  Follow-Up: - Your physician recommends that you schedule a follow-up appointment in: 3 months with Dr. Kirke CorinArida. - Dr. Graciela HusbandsKlein will see you back as needed - Per Dr. Graciela HusbandsKlein, you do not need to see Rudi Cocoonna Carroll, NP in the a-fib clinic tomorrow.   Any Additional Special Instructions Will Be Listed Below (If Applicable). - none

## 2014-12-17 ENCOUNTER — Ambulatory Visit (HOSPITAL_COMMUNITY): Payer: Medicare Other | Admitting: Nurse Practitioner

## 2014-12-17 LAB — BASIC METABOLIC PANEL
BUN/Creatinine Ratio: 28 — ABNORMAL HIGH (ref 11–26)
BUN: 28 mg/dL — AB (ref 8–27)
CALCIUM: 9.2 mg/dL (ref 8.7–10.3)
CO2: 27 mmol/L (ref 18–29)
Chloride: 101 mmol/L (ref 97–108)
Creatinine, Ser: 1.01 mg/dL — ABNORMAL HIGH (ref 0.57–1.00)
GFR calc Af Amer: 59 mL/min/{1.73_m2} — ABNORMAL LOW (ref >59)
GFR calc non Af Amer: 52 mL/min/{1.73_m2} — ABNORMAL LOW (ref >59)
GLUCOSE: 137 mg/dL — AB (ref 65–99)
POTASSIUM: 4.7 mmol/L (ref 3.5–5.2)
Sodium: 143 mmol/L (ref 134–144)

## 2014-12-17 NOTE — H&P (Signed)
PATIENT NAME:  [name], [name] MR#:  [id number] DATE OF BIRTH:  08/31/1928  DATE OF ADMISSION:  07/29/2014  ADDENDUM: The patient's medication list was reconciled and the patient is on the current medications: Pravachol 20 mg p.o. daily at bedtime, Tylenol with hydrocodone 325 mg/5 mg 1 tablet 3 times daily as needed, alprazolam 0.5 mg twice daily morning as well as afternoon and 1 mg at bedtime, amlodipine 5 mg p.o. daily, aspirin 81 mg p.o. daily, carbidopa/levodopa 25/100 one tablet 3 times daily, Plavix 75 mg p.o. daily, Flonase 2 sprays once daily, furosemide 40 mg p.o. daily, Humalog 8 to 18 units subcutaneously 3 times daily before meals, levothyroxine 75 mcg p.o. daily, lisinopril 10 mg p.o. twice daily, multivitamins once daily, omeprazole 20 mg p.o. daily, Zantac 5000 units/17,000 units/27,000 units 3 capsules 3 times daily with meals.    ____________________________ Katharina Caperima Dyllin Gulley, MD rv:TT D: 07/29/2014 15:10:12 ET T: 07/29/2014 15:45:24 ET JOB#: [id number]  cc: Katharina Caperima Talicia Sui, MD, <Dictator> Taneshia Lorence MD ELECTRONICALLY SIGNED 09/02/2014 17:58

## 2014-12-17 NOTE — Progress Notes (Signed)
Loop recorder 

## 2014-12-17 NOTE — H&P (Signed)
PATIENT NAME:  [name], [name] MR#:  [id number] DATE OF BIRTH:  29-Nov-1928  DATE OF ADMISSION:  07/29/2014  PRIMARY CARE PHYSICIAN: Ronna Polio, MD  HISTORY OF PRESENT ILLNESS: The patient is an 81 year old Caucasian female with history of 2 vessel coronary artery disease, according to cardiac catheterization in January 2015 by Dr. Kirke Corin, also history of diabetes mellitus, hypertension and chronic diastolic CHF who presents to the hospital with complaints of right-sided weakness. Apparently, the patient had a few episodes of weakness in the right upper extremity and questionable right lower extremity over the past few days. First episode was noted on Friday, a few days ago, when she was sitting at home and started having slurring of speech. Her aide noted slurring of speech, but the patient herself did not notice anything unusual. According to aide, she also was having right upper extremity weakness. EMS came in and recommended to come to Emergency Room; however, the patient refused since she had an appointment with her cardiologist, Dr. Kirke Corin, at 2:00 p.m. Today, however, she had very similar episode with right upper extremity weakness. She admits of having some slurring of speech and feeling somewhat weak over all. She denies any visual symptoms. She denies any swallowing problems. However, since her symptoms are recurrent and consistently on one side, she was requested to be admitted by Emergency Room physician, Dr. Mayford Knife.  PAST MEDICAL HISTORY: Significant for history of headaches, admission for headaches which were felt due to hypertension in May 2015, hypertension, diabetes mellitus, chronic diastolic CHF, history of nonalcoholic steatohepatitis, osteoarthritis, gastroparesis, pancreatic surgery, 2 vessel coronary artery disease according to cardiac catheterization in January 2015 by Dr. Kirke Corin which also revealed chronic occluded LAD with right to left collaterals with normal ejection fraction of  60%, and chronic respiratory failure, on 2 liters of oxygen through nasal cannula at home.   SOCIAL HISTORY: No alcohol, tobacco or drug abuse.   FAMILY HISTORY: Coronary artery disease.  ALLERGIES: BENADRYL AND REGLAN AS WELL AS TAPE.  HOME MEDICATIONS: According to medical records, the patient is on acetaminophen/hydrocodone 325 mg/5 mg 1 tablet twice daily as needed, alprazolam 1 mg half tablet in the morning and half tablet in the afternoon and 1 tablet at bedtime, amlodipine 2.5 mg p.o. daily, aspirin 81 mg p.o. daily, buspirone 10 mg twice daily, carbidopa/levodopa 25/100 one tablets 3 times daily, Plavix 75 mg p.o. daily, Fioricet 1 capsule every 4 to 6 hours as needed, furosemide 40 mg p.o. daily, Humalog 5 units subcutaneously before meals, Lisinopril 20 mg p.o. twice daily, metoprolol succinate 100 mg p.o. daily, multivitamins once daily, Pancrease 3 tablets 3 times daily with meals, Pravachol 20 mg p.o. daily, Requip 0.25 mg p.o. at bedtime, Zenpep 5,000 units/17,000 units/27,000 units delayed release capsule 3 capsules 3 times daily, Zofran ODT 4 mg every 4 to 6 hours as needed. It is unclear if she is still taking these medications as the patient's home medication review is still not done.  REVIEW OF SYSTEMS: Positive for frontal headaches intermittently, some dyspnea on exertion which is chronic, also waking up at nighttime to urinate at least once, admits of having left shoulder as well as left knee pains. CONSTITUTIONAL: Otherwise, denies fevers, chills, fatigue, weakness, weight loss or gain. EYES: Denies any blurry vision, double vision, glaucoma or cataracts.  ENT: Denies any tinnitus, allergies, epistaxis, sinus pain, dentures, difficulty swallowing.  RESPIRATORY: Denies any cough, wheezes, asthma, COPD. CARDIOVASCULAR: Denies chest pain, orthopnea, edema, arrhythmias, palpitations or syncope.  GASTROINTESTINAL: Denies  any nausea, diarrhea, constipation, rectal bleeding, change in  bowel habits. GENITOURINARY: Denies any dysuria, hematuria, frequency or incontinence.  ENDOCRINOLOGY: Denies polydipsia, nocturia, thyroid problems, heat or cold intolerance or thirst.  HEMATOLOGIC: Denies anemia, easy bruising, bleeding or swollen glands. SKIN: Denies any acne, rash, lesions, or change in moles.  MUSCULOSKELETAL: Denies arthritis, cramps, swelling.  NEUROLOGIC: Denies numbness, epilepsy or tremors. Admits of having right upper extremity weakness intermittently over the past few days. PSYCHIATRIC: Denies anxiety, insomnia, depression.   On further questioning, it appears the patient is not really sure if she in fact has weakness because she did not feel much weaker, but admits that her aide told that her right upper extremity was weaker than the left.   PHYSICAL EXAMINATION: VITAL SIGNS: On arrival to the Emergency Room, the patient's temperature was 97.9, pulse 67, respiration 18, blood pressure 128/60 and saturation 95% on room air.  GENERAL: This is a well-developed, well-nourished, obese Caucasian female in no significant distress, lying on the stretcher.  HEENT: Pupils are equal, reactive to light. Extraocular movements intact. No icterus or conjunctivitis. Has normal hearing. No pharyngeal erythema. Mucosa is moist.  NECK: No masses. Supple and nontender. Thyroid is not enlarged. No adenopathy. No JVD or carotid bruits bilaterally. Full range of motion.  LUNGS: Clear to auscultation anteriorly. No rales, rhonchi or wheezing. No labored inspirations, increased effort, dullness to percussion or overt respiratory distress.  CARDIOVASCULAR: S1 and S2 appreciated. Rhythm was regular. PMI not lateralized. Chest is nontender to palpation. 1+ pedal pulses. Trace lower extremity edema, more on the right side. No calf tenderness or cyanosis was noted.  ABDOMEN: Soft, nontender. Bowel sounds are present. No hepatosplenomegaly or masses were noted.  RECTAL: Deferred.  MUSCLE STRENGTH:  Able to move all extremities. No cyanosis, degenerative joint disease or kyphosis. Gait was not tested.  SKIN: Did not reveal any rashes, lesions, erythema, nodularity, or induration. It was warm and dry to palpation.  LYMPHATIC: No adenopathy in the cervical region.  NEUROLOGIC: Cranial nerves grossly intact. Sensory is intact. No dysarthria or aphasia.  PSYCH: The patient is alert, oriented to time, person and place, cooperative. Memory is good. No significant confusion, agitation, or depression were noted.   DIAGNOSTIC DATA: BMP showed BUN level of 19 and glucose 182; otherwise, BMP was unremarkable. Albumin level 3.2. Otherwise, liver enzymes were unremarkable. Troponin was less than 0.02. White blood cell count 5.5, hemoglobin 12, platelet count 91,000.   Urinalysis: 7 white blood cells, 2 red blood cells, 3+ bacteria, trace leukocyte esterase, less than 1 epithelial cell and 8 hyaline casts.   Radiologic studies: CT scan of head without contrast 8th of December 2015 revealed negative for bleed or other acute intracranial process, atrophy and nonspecific white matter changes were noted.   ASSESSMENT AND PLAN: 1.  Questionable transient ischemic attack. Admit the patient to the medical floor, on telemetry floor. Continue her aspirin as well as Plavix. We will get carotid ultrasound done as well as echocardiogram done and we will follow the patient's blood pressure readings. We will also check her lipid panel and continue her on statin.  2.  Pyuria, questionable urinary tract infection. Get urine cultures and start the patient on Levaquin p.o. adjusting according to culture results.  3.  Diabetes mellitus. Follow the patient's diabetes control with current medications. Continue diabetic diet.  4.  History of hypertension. Continue outpatient medications. Follow blood pressure readings.  5.  History of chronic diastolic congestive heart failure. Seems to  be stable at present. We will continue her  outpatient management.   TIME SPENT: 50 minutes. ____________________________ Katharina Caper, MD rv:sb D: 07/29/2014 14:28:22 ET T: 07/29/2014 15:35:00 ET JOB#: [id number]  cc: Katharina Caper, MD, <Dictator> Ginette Pitman. [name], MD Katharina Caper MD ELECTRONICALLY SIGNED 09/02/2014 18:01

## 2014-12-18 ENCOUNTER — Encounter: Payer: Self-pay | Admitting: Nurse Practitioner

## 2014-12-18 ENCOUNTER — Ambulatory Visit (INDEPENDENT_AMBULATORY_CARE_PROVIDER_SITE_OTHER): Payer: Medicare Other | Admitting: Nurse Practitioner

## 2014-12-18 VITALS — BP 120/76 | HR 72 | Temp 97.6°F | Resp 14 | Ht 59.0 in | Wt 177.8 lb

## 2014-12-18 DIAGNOSIS — I638 Other cerebral infarction: Secondary | ICD-10-CM | POA: Diagnosis not present

## 2014-12-18 DIAGNOSIS — M179 Osteoarthritis of knee, unspecified: Secondary | ICD-10-CM

## 2014-12-18 DIAGNOSIS — R350 Frequency of micturition: Secondary | ICD-10-CM

## 2014-12-18 DIAGNOSIS — M1710 Unilateral primary osteoarthritis, unspecified knee: Secondary | ICD-10-CM

## 2014-12-18 DIAGNOSIS — IMO0002 Reserved for concepts with insufficient information to code with codable children: Secondary | ICD-10-CM

## 2014-12-18 LAB — POCT URINALYSIS DIPSTICK
Blood, UA: NEGATIVE
Glucose, UA: NEGATIVE
Leukocytes, UA: NEGATIVE
Nitrite, UA: NEGATIVE
Spec Grav, UA: 1.025
UROBILINOGEN UA: 0.2
pH, UA: 5

## 2014-12-18 NOTE — Progress Notes (Signed)
   Subjective:    Patient ID: [name], female    DOB: 04/13/1929, 81 y.o.   MRN: [id number]  HPI  [name] is a 81 yo female with a CC twisting right foot last night and has other pains.   1) Left knee, right foot, and right arm bothering her. Patient has known OA and has orthopedics doctor. She is concerned about her right foot. [place] is somewhat helpful.   Urinary frequency- wants urine checked  Review of Systems  Constitutional: Negative for fever, chills, diaphoresis and fatigue.  Respiratory: Negative for chest tightness, shortness of breath and wheezing.   Cardiovascular: Negative for chest pain, palpitations and leg swelling.  Gastrointestinal: Negative for nausea, vomiting and diarrhea.  Musculoskeletal: Positive for myalgias and arthralgias. Negative for joint swelling, gait problem, neck pain and neck stiffness.  Skin: Negative for rash.  Neurological: Negative for dizziness, weakness, numbness and headaches.  Psychiatric/Behavioral: The patient is not nervous/anxious.       Objective:   Physical Exam  Constitutional: She is oriented to person, place, and time. She appears well-developed and well-nourished. No distress.  BP 120/76 mmHg  Pulse 72  Temp(Src) 97.6 F (36.4 C) (Oral)  Resp 14  Ht 4\' 11"  (1.499 m)  Wt 177 lb 12.8 oz (80.65 kg)  BMI 35.89 kg/m2  SpO2 96%   HENT:  Head: Normocephalic and atraumatic.  Right Ear: External ear normal.  Left Ear: External ear normal.  Cardiovascular: Normal rate, regular rhythm and normal heart sounds.  Exam reveals no gallop and no friction rub.   No murmur heard. Pulmonary/Chest: Effort normal and breath sounds normal. No respiratory distress. She has no wheezes. She has no rales. She exhibits no tenderness.  Musculoskeletal: She exhibits tenderness. She exhibits no edema.  Tender right foot, no visualized abnormalities, good ROM, pedal pulse intact, Left knee and Right arm are chronic problems.   Neurological:  She is alert and oriented to person, place, and time. No cranial nerve deficit. She exhibits normal muscle tone. Coordination normal.  Skin: Skin is warm and dry. No rash noted. She is not diaphoretic.  Psychiatric: She has a normal mood and affect. Her behavior is normal. Judgment and thought content normal.       Assessment & Plan:

## 2014-12-18 NOTE — Progress Notes (Signed)
Pre visit review using our clinic review tool, if applicable. No additional management support is needed unless otherwise documented below in the visit note. 

## 2014-12-18 NOTE — Patient Instructions (Addendum)
Take the vicodin as prescribed.   Follow up with your orthopedic doctor due to the ability for them to x-ray and inject the joints as needed due to your osteoarthritis.

## 2014-12-21 NOTE — Op Note (Signed)
PATIENT NAME:  [name], [name] MR#:  [id number] DATE OF BIRTH:  1928/11/20  DATE OF PROCEDURE:  10/21/2014  PREOPERATIVE DIAGNOSIS:  Cryptogenic stroke.   PROCEDURE:  Implantation of a loop recorder following local anesthesia and routine prep and drape a [employer] implantable loop recorder serial [id number] was inserted in a small incision to the left of the sternum. The patients wound was closed with a benzoin and Steri-Strip dressing. The patient tolerated the procedure without apparent complication.   ____________________________ Duke SalviaSteven C. Klein, MD sck:mc D: 10/21/2014 13:15:49 ET T: 10/21/2014 13:37:23 ET JOB#: [id number]  cc:

## 2014-12-26 ENCOUNTER — Other Ambulatory Visit: Payer: Self-pay | Admitting: Internal Medicine

## 2014-12-26 ENCOUNTER — Encounter: Payer: Medicare Other | Attending: Surgery | Admitting: Surgery

## 2014-12-26 DIAGNOSIS — E119 Type 2 diabetes mellitus without complications: Secondary | ICD-10-CM | POA: Diagnosis not present

## 2014-12-26 DIAGNOSIS — S31102S Unspecified open wound of abdominal wall, epigastric region without penetration into peritoneal cavity, sequela: Secondary | ICD-10-CM | POA: Insufficient documentation

## 2014-12-26 DIAGNOSIS — Z8673 Personal history of transient ischemic attack (TIA), and cerebral infarction without residual deficits: Secondary | ICD-10-CM | POA: Diagnosis not present

## 2014-12-26 DIAGNOSIS — Y839 Surgical procedure, unspecified as the cause of abnormal reaction of the patient, or of later complication, without mention of misadventure at the time of the procedure: Secondary | ICD-10-CM | POA: Insufficient documentation

## 2014-12-26 DIAGNOSIS — I10 Essential (primary) hypertension: Secondary | ICD-10-CM | POA: Insufficient documentation

## 2014-12-26 DIAGNOSIS — E669 Obesity, unspecified: Secondary | ICD-10-CM | POA: Insufficient documentation

## 2014-12-26 DIAGNOSIS — I2510 Atherosclerotic heart disease of native coronary artery without angina pectoris: Secondary | ICD-10-CM | POA: Insufficient documentation

## 2014-12-26 DIAGNOSIS — I5020 Unspecified systolic (congestive) heart failure: Secondary | ICD-10-CM | POA: Diagnosis not present

## 2014-12-26 NOTE — Progress Notes (Signed)
[name] ([id number]) Visit Report for 12/26/2014 Chief Complaint Document Details Patient Name: [name], [name]. Date of Service: 12/26/2014 1:00 PM Medical Record Number: [id number] Patient Account Number: [id number] Date of Birth/Sex: 22-Aug-1929 (81 y.o. Female) Treating [name]: Primary Care Physician: Ronna Polio Other Clinician: Referring Physician: Ronna Polio Treating Physician/Extender: Rudene Re in Treatment: 52 Information Obtained from: Patient Chief Complaint Abdominal wall ulceration. This has been a chronic wound from a previous surgery 12 years ago. She has been seen in the wound clinic for a long while and she feels that she is doing much better now. No fresh issues. 11/07/2014 -- She was not here for the last 2 weeks because she had a mini stroke and was under treatment for this. other than that she's been doing fine as far as her abdomen goes and she has some home health nurses coming to help her with her dressing. Electronic Signature(s) Signed: 12/26/2014 1:42:51 PM By: Evlyn Kanner MD, FACS Entered By: Evlyn Kanner on 12/26/2014 13:23:14 [name], [name] ([id number]) -------------------------------------------------------------------------------- HPI Details Patient Name: [name] Date of Service: 12/26/2014 1:00 PM Medical Record Number: [id number] Patient Account Number: [id number] Date of Birth/Sex: [birth year] (81 y.o. Female) Treating [name]: Primary Care Physician: Ronna Polio Other Clinician: Referring Physician: Ronna Polio Treating Physician/Extender: Rudene Re in Treatment: 32 History of Present Illness HPI Description: Pleasant 81 year old with h/o ex lap for pancreatitis, DM, CHF. returns to clinic for evaluation of her open abdominal wound. She has been applying silver alginate. She is without complaints today. No significant pain. No drainage. No fever or chills. Tolerating a regular diet and having regular  bowel movements. 10/17/14 -- she has no fresh complaints and she and her daughter thinks that everything is going fine. She says most of the areas have healed nicely. 11/28/2014 -- she has generally been doing fine and has no fresh issues. Wound care nurses will be able to come only twice a week now and she was wondering how her dressings would be done. Electronic Signature(s) Signed: 12/26/2014 1:42:51 PM By: Evlyn Kanner MD, FACS Entered By: Evlyn Kanner on 12/26/2014 13:23:24 [name], [name] ([id number]) -------------------------------------------------------------------------------- Physical Exam Details Patient Name: [name] Date of Service: 12/26/2014 1:00 PM Medical Record Number: [id number] Patient Account Number: [id number] Date of Birth/Sex: Jan 01, 1929 (81 y.o. Female) Treating [name]: Primary Care Physician: Ronna Polio Other Clinician: Referring Physician: Ronna Polio Treating Physician/Extender: Rudene Re in Treatment: 58 Constitutional . Pulse regular. Respirations normal and unlabored. Afebrile. . Eyes Nonicteric. Reactive to light. Ears, Nose, Mouth, and Throat Lips, teeth, and gums WNL.Marland Kitchen Moist mucosa without lesions . Neck supple and nontender. No palpable supraclavicular or cervical adenopathy. Normal sized without goiter. Respiratory WNL. No retractions.. Gastrointestinal (GI) the abdominal wound shows very good healing and the small area at about the 9:00 position may have a little bit open.. No liver or spleen enlargement or tenderness.. Integumentary (Hair, Skin) few skin lesions on her face which look like AK or SCC. No crepitus or fluctuance. No peri-wound warmth or erythema. No masses.Marland Kitchen Psychiatric Judgement and insight Intact.. No evidence of depression, anxiety, or agitation.. Electronic Signature(s) Signed: 12/26/2014 1:42:51 PM By: Evlyn Kanner MD, FACS Entered By: Evlyn Kanner on 12/26/2014 13:25:12 [name], [name]  ([id number]) -------------------------------------------------------------------------------- Physician Orders Details Patient Name: [name] Date of Service: 12/26/2014 1:00 PM Medical Record Number: [id number] Patient Account Number: [id number] Date of Birth/Sex: Nov 18, 1928 (81 y.o. Female) Treating [name]: Huel Coventry Primary Care Physician: Ronna Polio Other  Clinician: Referring Physician: Ronna PolioWalker, [name] Treating Physician/Extender: Rudene ReBritto, Navah Grondin Weeks in Treatment: 7464 Verbal / Phone Orders: Yes Clinician: Huel CoventryWoody, [name] Read Back and Verified: Yes Diagnosis Coding Wound Cleansing Wound #2 Abdomen - midline o Cleanse wound with mild soap and water o May Shower, gently pat wound dry prior to applying new dressing. Primary Wound Dressing Wound #2 Abdomen - midline o Acticoat 7 Secondary Dressing Wound #2 Abdomen - midline o Boardered Foam Dressing - Large BFD Dressing Change Frequency Wound #2 Abdomen - midline o Dressing is to be changed Monday and Thursday. Follow-up Appointments Wound #2 Abdomen - midline o Return Appointment in 2 weeks. Home Health Wound #2 Abdomen - midline o [facility] Home Health Nurse may visit PRN to address patientos wound care needs. o FACE TO FACE ENCOUNTER: MEDICARE and MEDICAID PATIENTS: I certify that this patient is under my care and that I had a face-to-face encounter that meets the physician face-to-face encounter requirements with this patient on this date. The encounter with the patient was in whole or in part for the following MEDICAL CONDITION: (primary reason for Home Healthcare) MEDICAL NECESSITY: I certify, that based on my findings, NURSING services are a medically necessary home health service. HOME BOUND STATUS: I certify that my clinical findings support that this patient is homebound (i.e., Due to illness or injury, pt requires aid of supportive devices such  as crutches, cane, wheelchairs, walkers, the use of special transportation or the assistance of another person to leave their place of residence. There is a normal inability to leave the home and doing so requires considerable and taxing effort. Other [name], [name] ([id number]) absences are for medical reasons / religious services and are infrequent or of short duration when for other reasons). o If current dressing causes regression in wound condition, may D/C ordered dressing product/s and apply Normal Saline Moist Dressing daily until next Wound Healing Center / Other MD appointment. Notify Wound Healing Center of regression in wound condition at [phone]. o Please direct any NON-WOUND related issues/requests for orders to patient's Primary Care Physician Electronic Signature(s) Signed: 12/26/2014 1:42:51 PM By: Evlyn KannerBritto, Garek Schuneman MD, FACS Signed: 12/26/2014 2:43:41 PM By: [name] GurneyWoody, [name], BSN, [name], BSN Entered By: [name] GurneyWoody, [name], BSN, [name] on 12/26/2014 13:22:32 [name], [name] ([id number]) -------------------------------------------------------------------------------- Problem List Details Patient Name: [name], [name] Date of Service: 12/26/2014 1:00 PM Medical Record Number: [id number] Patient Account Number: [id number] Date of Birth/Sex: 09/12/1928 (81 y.o. Female) Treating [name]: Primary Care Physician: Ronna PolioWalker, [name] Other Clinician: Referring Physician: Ronna PolioWalker, [name] Treating Physician/Extender: Rudene ReBritto, Hollyn Stucky Weeks in Treatment: 5464 Active Problems ICD-9 Encounter Code Description Active Date Diagnosis 879.2 Open Wound - Abdominal Wall, anterior, w/o mention of 09/30/2013 Yes complications 249.00 Secondary diabetes mellitus without mention of 09/30/2013 Yes complication; not stated as uncontrolled, or unspecified 428.0 Congestive heart failure, unspecified 09/30/2013 Yes 414.9 Coronary atherosclerosis; Chronic ischemic heart disease, 09/30/2013 Yes unspecified 401.1 Essential  Hypertension- Benign 09/30/2013 Yes ICD-10 Encounter Code Description Active Date Diagnosis S31.102S Unspecified open wound of abdominal wall, epigastric 09/05/2014 Yes region without penetration into peritoneal cavity, sequela E11.622 Type 2 diabetes mellitus with other skin ulcer 09/05/2014 Yes Related ICD-9 Code: 879.2 - Open Wound - Abdominal Wall, anterior, w/o mention of complications I50.20 Unspecified systolic (congestive) heart failure 09/05/2014 Yes E66.9 Obesity, unspecified 09/05/2014 Yes Nobbe, Sydelle O. ([id number]) S31.105D Unspecified open wound of abdominal wall, periumbilic 10/17/2014 Yes region without penetration into peritoneal cavity, subsequent encounter Inactive Problems Resolved  Problems Electronic Signature(s) Signed: 12/26/2014 1:42:51 PM By: Evlyn Kanner MD, FACS Entered By: Evlyn Kanner on 12/26/2014 13:23:02 [name], [name] ([id number]) -------------------------------------------------------------------------------- Progress Note Details Patient Name: [name] Date of Service: 12/26/2014 1:00 PM Medical Record Number: [id number] Patient Account Number: [id number] Date of Birth/Sex: Mar 22, 1929 (81 y.o. Female) Treating [name]: Primary Care Physician: Ronna Polio Other Clinician: Referring Physician: Ronna Polio Treating Physician/Extender: Rudene Re in Treatment: 37 Subjective Chief Complaint Information obtained from Patient Abdominal wall ulceration. This has been a chronic wound from a previous surgery 12 years ago. She has been seen in the wound clinic for a long while and she feels that she is doing much better now. No fresh issues. 11/07/2014 -- She was not here for the last 2 weeks because she had a mini stroke and was under treatment for this. other than that she's been doing fine as far as her abdomen goes and she has some home health nurses coming to help her with her dressing. History of Present Illness (HPI) Pleasant  81 year old with h/o ex lap for pancreatitis, DM, CHF. returns to clinic for evaluation of her open abdominal wound. She has been applying silver alginate. She is without complaints today. No significant pain. No drainage. No fever or chills. Tolerating a regular diet and having regular bowel movements. 10/17/14 -- she has no fresh complaints and she and her daughter thinks that everything is going fine. She says most of the areas have healed nicely. 11/28/2014 -- she has generally been doing fine and has no fresh issues. Wound care nurses will be able to come only twice a week now and she was wondering how her dressings would be done. Objective Constitutional Pulse regular. Respirations normal and unlabored. Afebrile. Vitals Time Taken: 1:10 PM, Height: 59 in, Weight: 181 lbs, BMI: 36.6, Temperature: 97.7 F, Pulse: 53 bpm, Respiratory Rate: 18 breaths/min, Blood Pressure: 153/87 mmHg. Eyes Nonicteric. Reactive to light. Ears, Nose, Mouth, and Throat Lips, teeth, and gums WNL.Marland Kitchen Moist mucosa without lesions . [name], [name] ([id number]) Neck supple and nontender. No palpable supraclavicular or cervical adenopathy. Normal sized without goiter. Respiratory WNL. No retractions.. Gastrointestinal (GI) the abdominal wound shows very good healing and the small area at about the 9:00 position may have a little bit open.. No liver or spleen enlargement or tenderness.Marland Kitchen Psychiatric Judgement and insight Intact.. No evidence of depression, anxiety, or agitation.. Integumentary (Hair, Skin) few skin lesions on her face which look like AK or SCC. No crepitus or fluctuance. No peri-wound warmth or erythema. No masses.. Wound #2 status is Open. Original cause of wound was Surgical Injury. The wound is located on the Abdomen - midline. The wound measures 0.4cm length x 0.2cm width x 0.1cm depth; 0.063cm^2 area and 0.006cm^3 volume. The wound is limited to skin breakdown. There is a small amount of  serosanguineous drainage noted. The wound margin is indistinct and nonvisible. There is large (67-100%) granulation within the wound bed. There is a small (1-33%) amount of necrotic tissue within the wound bed including Adherent Slough. The periwound skin appearance exhibited: Scarring, Dry/Scaly. The periwound skin appearance did not exhibit: Callus, Crepitus, Excoriation, Fluctuance, Friable, Induration, Localized Edema, Rash, Maceration, Moist, Atrophie Blanche, Cyanosis, Ecchymosis, Hemosiderin Staining, Mottled, Pallor, Rubor, Erythema. Periwound temperature was noted as No Abnormality. Assessment Active Problems ICD-9 879.2 - Open Wound - Abdominal Wall, anterior, w/o mention of complications 249.00 - Secondary diabetes mellitus without mention of complication; not stated as uncontrolled, or unspecified 428.0 - Congestive heart  failure, unspecified 414.9 - Coronary atherosclerosis; Chronic ischemic heart disease, unspecified 401.1 - Essential Hypertension- Benign ICD-10 S31.102S - Unspecified open wound of abdominal wall, epigastric region without penetration into peritoneal cavity, sequela E11.622 - Type 2 diabetes mellitus with other skin ulcer I50.20 - Unspecified systolic (congestive) heart failure E66.9 - Obesity, unspecified Govea, Elan O. ([id number]) S31.105D - Unspecified open wound of abdominal wall, periumbilic region without penetration into peritoneal cavity, subsequent encounter She has had extremely good result and we will continue treating that area around 9:00 position on her abdomen Acticoat 7. Dressing will be changed twice a week by home health nurses. I anticipate by the next time she sees as this will be completely healed. Plan Wound Cleansing: Wound #2 Abdomen - midline: Cleanse wound with mild soap and water May Shower, gently pat wound dry prior to applying new dressing. Primary Wound Dressing: Wound #2 Abdomen - midline: Acticoat 7 Secondary  Dressing: Wound #2 Abdomen - midline: Boardered Foam Dressing - Large BFD Dressing Change Frequency: Wound #2 Abdomen - midline: Dressing is to be changed Monday and Thursday. Follow-up Appointments: Wound #2 Abdomen - midline: Return Appointment in 2 weeks. Home Health: Wound #2 Abdomen - midline: [facility] Nurse may visit PRN to address patient s wound care needs. FACE TO FACE ENCOUNTER: MEDICARE and MEDICAID PATIENTS: I certify that this patient is under my care and that I had a face-to-face encounter that meets the physician face-to-face encounter requirements with this patient on this date. The encounter with the patient was in whole or in part for the following MEDICAL CONDITION: (primary reason for Home Healthcare) MEDICAL NECESSITY: I certify, that based on my findings, NURSING services are a medically necessary home health service. HOME BOUND STATUS: I certify that my clinical findings support that this patient is homebound (i.e., Due to illness or injury, pt requires aid of supportive devices such as crutches, cane, wheelchairs, walkers, the use of special transportation or the assistance of another person to leave their place of residence. There is a normal inability to leave the home and doing so requires considerable and taxing effort. Other absences are for medical reasons / religious services and are infrequent or of short duration when for other reasons). If current dressing causes regression in wound condition, may D/C ordered dressing product/s and apply Normal Saline Moist Dressing daily until next Wound Healing Center / Other MD appointment. Notify Wound Healing Center of regression in wound condition at [phone]. Please direct any NON-WOUND related issues/requests for orders to patient's Primary Care Physician [name], Ishitha O. ([id number]) She has had extremely good result and we will continue treating that  area around 9:00 position on her abdomen Acticoat 7. Dressing will be changed twice a week by home health nurses. I anticipate by the next time she sees as this will be completely healed. I have asked her to see her family physician or her dermatologist regarding the suspicious lesion she has on her face to get an opinion regarding excision. Electronic Signature(s) Signed: 12/26/2014 1:42:51 PM By: Evlyn KannerBritto, Jennifier Smitherman MD, FACS Entered By: Evlyn KannerBritto, Johsua Shevlin on 12/26/2014 13:27:28

## 2014-12-26 NOTE — Progress Notes (Signed)
[name] ([id number]) Visit Report for 12/26/2014 Arrival Information Details Patient Name: [name], [name]. Date of Service: 12/26/2014 1:00 PM Medical Record Number: [id number] Patient Account Number: [id number] Date of Birth/Sex: 10-03-1928 (81 y.o. Female) Treating RN: Huel Coventry Primary Care Physician: Ronna Polio Other Clinician: Referring Physician: Ronna Polio Treating Physician/Extender: Rudene Re in Treatment: 80 Visit Information History Since Last Visit Added or deleted any medications: No Patient Arrived: [name] Any new allergies or adverse reactions: No Arrival Time: 13:12 Had a fall or experienced change in No Accompanied By: son in lobby activities of daily living that may affect Transfer Assistance: None risk of falls: Patient Identification Verified: Yes Signs or symptoms of abuse/neglect since last No Secondary Verification Process Yes visito Completed: Hospitalized since last visit: No Patient Requires Transmission- No Has Dressing in Place as Prescribed: Yes Based Precautions: Pain Present Now: No Patient Has Alerts: Yes Patient Alerts: Patient on Blood Thinner Electronic Signature(s) Signed: 12/26/2014 2:43:41 PM By: Elliot Gurney, RN, BSN, Kim RN, BSN Entered By: Elliot Gurney, RN, BSN, Kim on 12/26/2014 13:12:59 [name] ([id number]) -------------------------------------------------------------------------------- Clinic Level of Care Assessment Details Patient Name: [name]. Date of Service: 12/26/2014 1:00 PM Medical Record Number: [id number] Patient Account Number: [id number] Date of Birth/Sex: 1928/12/17 (81 y.o. Female) Treating RN: Huel Coventry Primary Care Physician: Ronna Polio Other Clinician: Referring Physician: Ronna Polio Treating Physician/Extender: Rudene Re in Treatment: 28 Clinic Level of Care Assessment Items TOOL 4 Quantity Score  - Use when only an EandM is performed on FOLLOW-UP visit  0 ASSESSMENTS - Nursing Assessment / Reassessment  - Reassessment of Co-morbidities (includes updates in patient status) 0 X - Reassessment of Adherence to Treatment Plan 1 5 ASSESSMENTS - Wound and Skin Assessment / Reassessment X - Simple Wound Assessment / Reassessment - one wound 1 5  - Complex Wound Assessment / Reassessment - multiple wounds 0  - Dermatologic / Skin Assessment (not related to wound area) 0 ASSESSMENTS - Focused Assessment  - Circumferential Edema Measurements - multi extremities 0  - Nutritional Assessment / Counseling / Intervention 0  - Lower Extremity Assessment (monofilament, tuning fork, pulses) 0  - Peripheral Arterial Disease Assessment (using hand held doppler) 0 ASSESSMENTS - Ostomy and/or Continence Assessment and Care  - Incontinence Assessment and Management 0  - Ostomy Care Assessment and Management (repouching, etc.) 0 PROCESS - Coordination of Care X - Simple Patient / Family Education for ongoing care 1 15  - Complex (extensive) Patient / Family Education for ongoing care 0  - Staff obtains Chiropractor, Records, Test Results / Process Orders 0  - Staff telephones HHA, Nursing Homes / Clarify orders / etc 0  - Routine Transfer to another Facility (non-emergent condition) 0 [name], [name] ([id number]) X - Routine Hospital Admission (non-emergent condition) 1 10  - New Admissions / Manufacturing engineer / Ordering NPWT, Apligraf, etc. 0  - Emergency Hospital Admission (emergent condition) 0 X - Simple Discharge Coordination 1 10  - Complex (extensive) Discharge Coordination 0 PROCESS - Special Needs  - Pediatric / Minor Patient Management 0  - Isolation Patient Management 0  - Hearing / Language / Visual special needs 0  - Assessment of Community assistance (transportation, D/C planning, etc.) 0  - Additional assistance / Altered mentation 0  - Support Surface(s) Assessment (bed, cushion, seat, etc.)  0 INTERVENTIONS - Wound Cleansing / Measurement X - Simple Wound Cleansing - one wound 1 5  - Complex Wound Cleansing - multiple wounds 0  X - Wound Imaging (photographs - any number of wounds) 1 5 []  - Wound Tracing (instead of photographs) 0 X - Simple Wound Measurement - one wound 1 5 []  - Complex Wound Measurement - multiple wounds 0 INTERVENTIONS - Wound Dressings X - Small Wound Dressing one or multiple wounds 1 10 []  - Medium Wound Dressing one or multiple wounds 0 []  - Large Wound Dressing one or multiple wounds 0 []  - Application of Medications - topical 0 []  - Application of Medications - injection 0 INTERVENTIONS - Miscellaneous []  - External ear exam 0 [name], [name] ([id number]) []  - Specimen Collection (cultures, biopsies, blood, body fluids, etc.) 0 []  - Specimen(s) / Culture(s) sent or taken to Lab for analysis 0 []  - Patient Transfer (multiple staff / Michiel SitesHoyer Lift / Similar devices) 0 []  - Simple Staple / Suture removal (25 or less) 0 []  - Complex Staple / Suture removal (26 or more) 0 []  - Hypo / Hyperglycemic Management (close monitor of Blood Glucose) 0 []  - Ankle / Brachial Index (ABI) - do not check if billed separately 0 X - Vital Signs 1 5 Has the patient been seen at the hospital within the last three years: Yes Total Score: 75 Level Of Care: New/Established - Level 2 Electronic Signature(s) Signed: 12/26/2014 2:43:41 PM By: Elliot GurneyWoody, RN, BSN, Kim RN, BSN Entered By: Elliot GurneyWoody, RN, BSN, Kim on 12/26/2014 13:25:13 [name], [name] ([id number]) -------------------------------------------------------------------------------- Encounter Discharge Information Details Patient Name: [name] SpringLLOYD, [name] Date of Service: 12/26/2014 1:00 PM Medical Record Number: [id number] Patient Account Number: [id number] Date of Birth/Sex: 02/03/1929 (81 y.o. Female) Treating RN: Huel CoventryWoody, Kim Primary Care Physician: Ronna PolioWalker, [name] Other Clinician: Referring Physician: Ronna PolioWalker, [name] Treating  Physician/Extender: Rudene ReBritto, Errol Weeks in Treatment: 6564 Encounter Discharge Information Items Discharge Pain Level: 0 Discharge Condition: Stable Ambulatory Status: [name] Discharge Destination: Home Transportation: Private Auto Accompanied By: son in lobby Schedule Follow-up Appointment: Yes Medication Reconciliation completed Yes and provided to Patient/Care Delshon Blanchfield: Provided on Clinical Summary of Care: 12/26/2014 Form Type Recipient Paper Patient CL Electronic Signature(s) Signed: 12/26/2014 1:32:39 PM By: Gwenlyn PerkingMoore, Shelia Entered By: Gwenlyn PerkingMoore, Shelia on 12/26/2014 13:32:39 [name], [name] ([id number]) -------------------------------------------------------------------------------- Multi Wound Chart Details Patient Name: [name] SpringLLOYD, Rumaisa O. Date of Service: 12/26/2014 1:00 PM Medical Record Number: [id number] Patient Account Number: [id number] Date of Birth/Sex: 09/19/1928 (81 y.o. Female) Treating RN: Huel CoventryWoody, Kim Primary Care Physician: Ronna PolioWalker, [name] Other Clinician: Referring Physician: Ronna PolioWalker, [name] Treating Physician/Extender: Rudene ReBritto, Errol Weeks in Treatment: 6064 Vital Signs Height(in): 59 Pulse(bpm): 53 Weight(lbs): 181 Blood Pressure 153/87 (mmHg): Body Mass Index(BMI): 37 Temperature(F): 97.7 Respiratory Rate 18 (breaths/min): Photos: [2:No Photos] [N/A:N/A] Wound Location: [2:Abdomen - midline] [N/A:N/A] Wounding Event: [2:Surgical Injury] [N/A:N/A] Primary Etiology: [2:Open Surgical Wound] [N/A:N/A] Comorbid History: [2:Cataracts, Hypertension, Type II Diabetes] [N/A:N/A] Date Acquired: [2:06/23/2003] [N/A:N/A] Weeks of Treatment: [2:64] [N/A:N/A] Wound Status: [2:Open] [N/A:N/A] Measurements L x W x D 0.4x0.2x0.1 [N/A:N/A] (cm) Area (cm) : [2:0.063] [N/A:N/A] Volume (cm) : [2:0.006] [N/A:N/A] % Reduction in Area: [2:98.50%] [N/A:N/A] % Reduction in Volume: 98.60% [N/A:N/A] Classification: [2:Full Thickness Without Exposed Support Structures]  [N/A:N/A] Exudate Amount: [2:Small] [N/A:N/A] Exudate Type: [2:Serosanguineous] [N/A:N/A] Exudate Color: [2:red, brown] [N/A:N/A] Wound Margin: [2:Indistinct, nonvisible] [N/A:N/A] Granulation Amount: [2:Large (67-100%)] [N/A:N/A] Necrotic Amount: [2:Small (1-33%)] [N/A:N/A] Exposed Structures: [2:Fascia: No Fat: No Tendon: No Muscle: No Joint: No Bone: No] [N/A:N/A] Limited to Skin Breakdown Epithelialization: Medium (34-66%) N/A N/A Periwound Skin Texture: Scarring: Yes N/A N/A Edema: No Excoriation: No Induration: No Callus: No Crepitus: No Fluctuance: No  Friable: No Rash: No Periwound Skin Dry/Scaly: Yes N/A N/A Moisture: Maceration: No Moist: No Periwound Skin Color: Atrophie Blanche: No N/A N/A Cyanosis: No Ecchymosis: No Erythema: No Hemosiderin Staining: No Mottled: No Pallor: No Rubor: No Temperature: No Abnormality N/A N/A Tenderness on No N/A N/A Palpation: Wound Preparation: Ulcer Cleansing: N/A N/A Rinsed/Irrigated with Saline Topical Anesthetic Applied: None Treatment Notes Electronic Signature(s) Signed: 12/26/2014 2:43:41 PM By: Elliot Gurney, RN, BSN, Kim RN, BSN Entered By: Elliot Gurney, RN, BSN, Kim on 12/26/2014 13:25:35 [name], [name] ([id number]) -------------------------------------------------------------------------------- Multi-Disciplinary Care Plan Details Patient Name: [name] Date of Service: 12/26/2014 1:00 PM Medical Record Number: [id number] Patient Account Number: [id number] Date of Birth/Sex: 1928-11-11 (81 y.o. Female) Treating RN: Huel Coventry Primary Care Physician: Ronna Polio Other Clinician: Referring Physician: Ronna Polio Treating Physician/Extender: Rudene Re in Treatment: 82 Active Inactive Abuse / Safety / Falls / Self Care Management Nursing Diagnoses: Potential for falls Goals: Patient will remain injury free Date Initiated: 11/04/2013 Goal Status: Active Patient/caregiver will verbalize/demonstrate  measures taken to prevent injury and/or falls Date Initiated: 11/04/2013 Goal Status: Active Patient/caregiver will verbalize/demonstrate understanding of what to do in case of emergency Date Initiated: 11/04/2013 Goal Status: Active Interventions: Assess fall risk on admission and as needed Provide education on personal and home safety Notes: Wound/Skin Impairment Nursing Diagnoses: Impaired tissue integrity Goals: Ulcer/skin breakdown will heal within 14 weeks Date Initiated: 09/30/2013 Goal Status: Active Interventions: Provide education on ulcer and skin care Notes: [name], BOUCHILLON ([id number]) Electronic Signature(s) Signed: 12/26/2014 2:43:41 PM By: Elliot Gurney, RN, BSN, Kim RN, BSN Entered By: Elliot Gurney, RN, BSN, Kim on 12/26/2014 13:25:28 [name], [name] ([id number]) -------------------------------------------------------------------------------- Pain Assessment Details Patient Name: [name] Date of Service: 12/26/2014 1:00 PM Medical Record Number: [id number] Patient Account Number: [id number] Date of Birth/Sex: 11/09/1928 (81 y.o. Female) Treating RN: Huel Coventry Primary Care Physician: Ronna Polio Other Clinician: Referring Physician: Ronna Polio Treating Physician/Extender: Rudene Re in Treatment: 46 Active Problems Location of Pain Severity and Description of Pain Patient Has Paino No Site Locations Pain Management and Medication Current Pain Management: Electronic Signature(s) Signed: 12/26/2014 2:43:41 PM By: Elliot Gurney, RN, BSN, Kim RN, BSN Entered By: Elliot Gurney, RN, BSN, Kim on 12/26/2014 13:13:06 [name] ([id number]) -------------------------------------------------------------------------------- Patient/Caregiver Education Details Patient Name: [name] Date of Service: 12/26/2014 1:00 PM Medical Record Number: [id number] Patient Account Number: [id number] Date of Birth/Gender: June 18, 1929 (81 y.o. Female) Treating RN: Huel Coventry Primary  Care Physician: Ronna Polio Other Clinician: Referring Physician: Ronna Polio Treating Physician/Extender: Rudene Re in Treatment: 43 Education Assessment Education Provided To: Patient Education Topics Provided Basic Hygiene: Handouts: Other: may shower, wash wth soap and water, pat dry Methods: Demonstration, Explain/Verbal Responses: State content correctly Electronic Signature(s) Signed: 12/26/2014 2:43:41 PM By: Elliot Gurney, RN, BSN, Kim RN, BSN Entered By: Elliot Gurney, RN, BSN, Kim on 12/26/2014 13:27:20 [name], [name] ([id number]) -------------------------------------------------------------------------------- Wound Assessment Details Patient Name: [name] Date of Service: 12/26/2014 1:00 PM Medical Record Number: [id number] Patient Account Number: [id number] Date of Birth/Sex: 07-28-1929 (81 y.o. Female) Treating RN: Huel Coventry Primary Care Physician: Ronna Polio Other Clinician: Referring Physician: Ronna Polio Treating Physician/Extender: Rudene Re in Treatment: 65 Wound Status Wound Number: 2 Primary Open Surgical Wound Etiology: Wound Location: Abdomen - midline Wound Status: Open Wounding Event: Surgical Injury Comorbid Cataracts, Hypertension, Type II Date Acquired: 06/23/2003 History: Diabetes Weeks Of Treatment: 64 Clustered Wound: No Photos Photo Uploaded By: Elliot Gurney, RN, BSN, Kim on 12/26/2014 13:37:07 Wound Measurements  Length: (cm) 0.4 Width: (cm) 0.2 Depth: (cm) 0.1 Area: (cm) 0.063 Volume: (cm) 0.006 % Reduction in Area: 98.5% % Reduction in Volume: 98.6% Epithelialization: Medium (34-66%) Wound Description Full Thickness Without Exposed Classification: Support Structures Wound Margin: Indistinct, nonvisible Exudate Small Amount: Exudate Type: Serosanguineous Exudate Color: red, brown Foul Odor After Cleansing: No Wound Bed Granulation Amount: Large (67-100%) Exposed Structure Necrotic Amount: Small  (1-33%) Fascia Exposed: No Necrotic Quality: Adherent Slough Fat Layer Exposed: No [name], [name] ([id number]) Tendon Exposed: No Muscle Exposed: No Joint Exposed: No Bone Exposed: No Limited to Skin Breakdown Periwound Skin Texture Texture Color No Abnormalities Noted: No No Abnormalities Noted: No Callus: No Atrophie Blanche: No Crepitus: No Cyanosis: No Excoriation: No Ecchymosis: No Fluctuance: No Erythema: No Friable: No Hemosiderin Staining: No Induration: No Mottled: No Localized Edema: No Pallor: No Rash: No Rubor: No Scarring: Yes Temperature / Pain Moisture Temperature: No Abnormality No Abnormalities Noted: No Dry / Scaly: Yes Maceration: No Moist: No Wound Preparation Ulcer Cleansing: Rinsed/Irrigated with Saline Topical Anesthetic Applied: None Treatment Notes Wound #2 (Abdomen - midline) 1. Cleansed with: Clean wound with Normal Saline 4. Dressing Applied: Acticoat 7 5. Secondary Dressing Applied Bordered Foam Dressing Electronic Signature(s) Signed: 12/26/2014 2:43:41 PM By: Elliot GurneyWoody, RN, BSN, Kim RN, BSN Entered By: Elliot GurneyWoody, RN, BSN, Kim on 12/26/2014 13:18:18 [name], [name] ([id number]) -------------------------------------------------------------------------------- Vitals Details Patient Name: [name] SpringLLOYD, Jream O. Date of Service: 12/26/2014 1:00 PM Medical Record Number: [id number] Patient Account Number: [id number] Date of Birth/Sex: 03/10/1929 (81 y.o. Female) Treating RN: Huel CoventryWoody, Kim Primary Care Physician: Ronna PolioWalker, [name] Other Clinician: Referring Physician: Ronna PolioWalker, [name] Treating Physician/Extender: Rudene ReBritto, Errol Weeks in Treatment: 5264 Vital Signs Time Taken: 13:10 Temperature (F): 97.7 Height (in): 59 Pulse (bpm): 53 Weight (lbs): 181 Respiratory Rate (breaths/min): 18 Body Mass Index (BMI): 36.6 Blood Pressure (mmHg): 153/87 Reference Range: 80 - 120 mg / dl Electronic Signature(s) Signed: 12/26/2014 2:43:41 PM By: Elliot GurneyWoody, RN,  BSN, Kim RN, BSN Entered By: Elliot GurneyWoody, RN, BSN, Kim on 12/26/2014 13:13:36

## 2014-12-29 ENCOUNTER — Other Ambulatory Visit: Payer: Self-pay | Admitting: Internal Medicine

## 2015-01-01 DIAGNOSIS — R350 Frequency of micturition: Secondary | ICD-10-CM | POA: Insufficient documentation

## 2015-01-01 NOTE — Assessment & Plan Note (Signed)
Asked pt to continue vicoden and follow up with Orthopedics.

## 2015-01-01 NOTE — Assessment & Plan Note (Signed)
POCT urine shows no probable infection. Pt is taking Lasix

## 2015-01-05 ENCOUNTER — Telehealth: Payer: Self-pay | Admitting: *Deleted

## 2015-01-05 NOTE — Telephone Encounter (Signed)
Has she been taking Furosemide? Does she have any other symptoms such as leg swelling?

## 2015-01-05 NOTE — Telephone Encounter (Signed)
French Anaracy from [facility] called to advise of pts increased weight.  States pts weight on Thursday was 178.6lbs, today pts weight was 182.8lbs.  Please advise

## 2015-01-06 NOTE — Telephone Encounter (Signed)
OK. Let's just continue to monitor daily weights for now.

## 2015-01-06 NOTE — Telephone Encounter (Signed)
Spoke with French Anaracy, advised of MDs message.  French Anaracy verbalized understanding.

## 2015-01-06 NOTE — Telephone Encounter (Signed)
Spoke with French Anaracy, she states pt is taking 40mg  Furosemide daily.  Further states pt is not having any swelling.

## 2015-01-08 LAB — CUP PACEART REMOTE DEVICE CHECK: MDC IDC SESS DTM: 20160519142713

## 2015-01-14 ENCOUNTER — Ambulatory Visit (INDEPENDENT_AMBULATORY_CARE_PROVIDER_SITE_OTHER): Payer: Medicare Other | Admitting: *Deleted

## 2015-01-14 ENCOUNTER — Encounter: Payer: Self-pay | Admitting: Internal Medicine

## 2015-01-14 ENCOUNTER — Telehealth: Payer: Self-pay | Admitting: Internal Medicine

## 2015-01-14 DIAGNOSIS — I6389 Other cerebral infarction: Secondary | ICD-10-CM

## 2015-01-14 DIAGNOSIS — I638 Other cerebral infarction: Secondary | ICD-10-CM

## 2015-01-14 NOTE — Telephone Encounter (Signed)
Have no documentation of medication refill request.  Okay to refill?

## 2015-01-14 NOTE — Telephone Encounter (Signed)
Pt daughter called about her mothers Vicodin Rx wanting to pick it up the script. However there is nothing up front with the pts name to be picked up. The pt daughter has also called on 5.23.16 about this script. Pt only has a couple pills left. 01/14/15 maf

## 2015-01-15 ENCOUNTER — Other Ambulatory Visit: Payer: Self-pay | Admitting: Nurse Practitioner

## 2015-01-15 MED ORDER — HYDROCODONE-ACETAMINOPHEN 5-325 MG PO TABS: 1.0000 | ORAL_TABLET | Freq: Three times a day (TID) | ORAL | Status: DC | PRN

## 2015-01-15 NOTE — Telephone Encounter (Signed)
Refilled for 30 tablets of Hydrocodone-Acetaminophen, which should give pt 10 days until next visit with Dr. Dan HumphreysWalker on the 3rd of June.

## 2015-01-15 NOTE — Telephone Encounter (Signed)
Pts daughter called again today, states pt will be out of medication as of tonight.  Will you please advise in Dr Sonny DandyWalkers absence.  Call back number is [phone]

## 2015-01-16 NOTE — Progress Notes (Signed)
Loop recorder 

## 2015-01-21 ENCOUNTER — Other Ambulatory Visit: Payer: Self-pay | Admitting: Internal Medicine

## 2015-01-21 ENCOUNTER — Telehealth: Payer: Self-pay

## 2015-01-21 NOTE — Telephone Encounter (Signed)
Spoke with pts daughter Kendal HymenBonnie.  Advised pt has an appoint scheduled for 6.3.16 at 1:30 and that appoint needs to be kept for further refills

## 2015-01-21 NOTE — Telephone Encounter (Signed)
The pt's daughter called and is hoping to pick up the patient's pain medicine rx  Daughter - Kendal HymenBonnie  - callback [phone]

## 2015-01-23 ENCOUNTER — Encounter: Payer: Medicare Other | Attending: Surgery | Admitting: Surgery

## 2015-01-23 ENCOUNTER — Ambulatory Visit (INDEPENDENT_AMBULATORY_CARE_PROVIDER_SITE_OTHER): Payer: Medicare Other | Admitting: Internal Medicine

## 2015-01-23 ENCOUNTER — Encounter: Payer: Self-pay | Admitting: Internal Medicine

## 2015-01-23 VITALS — BP 126/70 | HR 64 | Temp 98.0°F | Resp 12 | Ht 59.0 in | Wt 180.0 lb

## 2015-01-23 DIAGNOSIS — S31102S Unspecified open wound of abdominal wall, epigastric region without penetration into peritoneal cavity, sequela: Secondary | ICD-10-CM | POA: Insufficient documentation

## 2015-01-23 DIAGNOSIS — E669 Obesity, unspecified: Secondary | ICD-10-CM | POA: Insufficient documentation

## 2015-01-23 DIAGNOSIS — I10 Essential (primary) hypertension: Secondary | ICD-10-CM

## 2015-01-23 DIAGNOSIS — I2510 Atherosclerotic heart disease of native coronary artery without angina pectoris: Secondary | ICD-10-CM | POA: Insufficient documentation

## 2015-01-23 DIAGNOSIS — I259 Chronic ischemic heart disease, unspecified: Secondary | ICD-10-CM | POA: Insufficient documentation

## 2015-01-23 DIAGNOSIS — IMO0002 Reserved for concepts with insufficient information to code with codable children: Secondary | ICD-10-CM

## 2015-01-23 DIAGNOSIS — E119 Type 2 diabetes mellitus without complications: Secondary | ICD-10-CM

## 2015-01-23 DIAGNOSIS — X58XXXD Exposure to other specified factors, subsequent encounter: Secondary | ICD-10-CM | POA: Diagnosis not present

## 2015-01-23 DIAGNOSIS — I5020 Unspecified systolic (congestive) heart failure: Secondary | ICD-10-CM | POA: Diagnosis not present

## 2015-01-23 DIAGNOSIS — R1314 Dysphagia, pharyngoesophageal phase: Secondary | ICD-10-CM

## 2015-01-23 DIAGNOSIS — S31105D Unspecified open wound of abdominal wall, periumbilic region without penetration into peritoneal cavity, subsequent encounter: Secondary | ICD-10-CM | POA: Insufficient documentation

## 2015-01-23 DIAGNOSIS — E11622 Type 2 diabetes mellitus with other skin ulcer: Secondary | ICD-10-CM | POA: Insufficient documentation

## 2015-01-23 DIAGNOSIS — M179 Osteoarthritis of knee, unspecified: Secondary | ICD-10-CM

## 2015-01-23 DIAGNOSIS — I638 Other cerebral infarction: Secondary | ICD-10-CM | POA: Diagnosis not present

## 2015-01-23 DIAGNOSIS — M1710 Unilateral primary osteoarthritis, unspecified knee: Secondary | ICD-10-CM

## 2015-01-23 LAB — COMPREHENSIVE METABOLIC PANEL
ALT: 4 U/L (ref 0–35)
AST: 15 U/L (ref 0–37)
Albumin: 3.8 g/dL (ref 3.5–5.2)
Alkaline Phosphatase: 79 U/L (ref 39–117)
BUN: 28 mg/dL — ABNORMAL HIGH (ref 6–23)
CALCIUM: 9 mg/dL (ref 8.4–10.5)
CHLORIDE: 100 meq/L (ref 96–112)
CO2: 34 mEq/L — ABNORMAL HIGH (ref 19–32)
Creatinine, Ser: 1.2 mg/dL (ref 0.40–1.20)
GFR: 45.54 mL/min — AB (ref >60.00)
GLUCOSE: 221 mg/dL — AB (ref 70–99)
Potassium: 4.9 mEq/L (ref 3.5–5.1)
Sodium: 139 mEq/L (ref 135–145)
TOTAL PROTEIN: 6.4 g/dL (ref 6.0–8.3)
Total Bilirubin: 0.5 mg/dL (ref 0.2–1.2)

## 2015-01-23 LAB — HEMOGLOBIN A1C: Hgb A1c MFr Bld: 7.9 % — ABNORMAL HIGH (ref 4.6–6.5)

## 2015-01-23 MED ORDER — HYDROCODONE-ACETAMINOPHEN 5-325 MG PO TABS: 1.0000 | ORAL_TABLET | Freq: Three times a day (TID) | ORAL | Status: DC | PRN

## 2015-01-23 NOTE — Assessment & Plan Note (Signed)
S/p left knee steroid injection. Will continue prn Hydrocodone for breakthrough pain.

## 2015-01-23 NOTE — Patient Instructions (Signed)
Labs today.  We will set up barium swallow to evaluate for narrowing.

## 2015-01-23 NOTE — Assessment & Plan Note (Signed)
BP Readings from Last 3 Encounters:  01/23/15 126/70  12/18/14 120/76  12/16/14 108/68   BP well controlled. Continue current medications. Renal function with labs.

## 2015-01-23 NOTE — Progress Notes (Signed)
Subjective:    Patient ID: [name], female    DOB: Oct 27, 1928, 81 y.o.   MRN: [id number]  HPI  81YO female presents for follow up.  Had steroid injection in left knee Wednesday. Had some improvement in pain.  DM - BG have been more elevated after steroid injection. Near 300s at times. Compliant with medications. No low BG<70.  Dysphagia - Over the last few months, notes solid food has been getting caught in throat.  Had a barium swallow in the past to evaluate this. No vomiting.   Wt Readings from Last 3 Encounters:  01/23/15 180 lb (81.647 kg)  12/18/14 177 lb 12.8 oz (80.65 kg)  12/16/14 176 lb 8 oz (80.06 kg)     Past medical, surgical, family and social history per today's encounter.  Review of Systems  Constitutional: Positive for fatigue. Negative for fever, chills, appetite change and unexpected weight change.  HENT: Positive for trouble swallowing. Negative for congestion, dental problem, postnasal drip, sneezing, sore throat and voice change.   Eyes: Negative for visual disturbance.  Respiratory: Negative for shortness of breath.   Cardiovascular: Negative for chest pain and leg swelling.  Gastrointestinal: Negative for nausea, vomiting, abdominal pain, diarrhea and constipation.  Musculoskeletal: Positive for myalgias, back pain and arthralgias.  Skin: Positive for wound. Negative for color change and rash.  Hematological: Negative for adenopathy. Does not bruise/bleed easily.  Psychiatric/Behavioral: Negative for dysphoric mood. The patient is not nervous/anxious.        Objective:    BP 126/70 mmHg  Pulse 64  Temp(Src) 98 F (36.7 C) (Oral)  Resp 12  Ht  (1.499 m)  Wt 180 lb (81.647 kg)  BMI 36.34 kg/m2  SpO2 99% Physical Exam  Constitutional: She is oriented to person, place, and time. She appears well-developed and well-nourished. No distress.  HENT:  Head: Normocephalic and atraumatic.  Right Ear: External ear normal.  Left Ear:  External ear normal.  Nose: Nose normal.  Mouth/Throat: Oropharynx is clear and moist. No oropharyngeal exudate.  Eyes: Conjunctivae are normal. Pupils are equal, round, and reactive to light. Right eye exhibits no discharge. Left eye exhibits no discharge. No scleral icterus.  Neck: Normal range of motion. Neck supple. No tracheal deviation present. No thyromegaly present.  Cardiovascular: Normal rate, regular rhythm, normal heart sounds and intact distal pulses.  Exam reveals no gallop and no friction rub.   No murmur heard. Pulmonary/Chest: Effort normal and breath sounds normal. No respiratory distress. She has no wheezes. She has no rales. She exhibits no tenderness.  Musculoskeletal: Normal range of motion. She exhibits no edema or tenderness.  Lymphadenopathy:    She has no cervical adenopathy.  Neurological: She is alert and oriented to person, place, and time. No cranial nerve deficit. She exhibits normal muscle tone. Coordination normal.  Skin: Skin is warm and dry. No rash noted. She is not diaphoretic. No erythema. No pallor.  Psychiatric: She has a normal mood and affect. Her behavior is normal. Judgment and thought content normal.          Assessment & Plan:   Problem List Items Addressed This Visit      High   Diabetes type 2, controlled - Primary    BG slightly elevated after recent steroid injection. Check A1c with labs. Continue current medication.      Relevant Orders   Comprehensive metabolic panel   Hemoglobin A1c     Unprioritized   Dysphagia, pharyngoesophageal phase  Recent dysphagia. Will set up barium swallow for further evaluation. Discussed EGD, however this would be of some risk given her ongoing health issues. Will hold off on EGD for now.      Relevant Orders   DG Esophagus   Essential hypertension    BP Readings from Last 3 Encounters:  01/23/15 126/70  12/18/14 120/76  12/16/14 108/68   BP well controlled. Continue current medications.  Renal function with labs.      Osteoarthrosis, unspecified whether generalized or localized, involving lower leg    S/p left knee steroid injection. Will continue prn Hydrocodone for breakthrough pain.      Relevant Medications   HYDROcodone-acetaminophen (NORCO/VICODIN) 5-325 MG per tablet       Return in about 4 weeks (around 02/20/2015).

## 2015-01-23 NOTE — Assessment & Plan Note (Signed)
Recent dysphagia. Will set up barium swallow for further evaluation. Discussed EGD, however this would be of some risk given her ongoing health issues. Will hold off on EGD for now.

## 2015-01-23 NOTE — Assessment & Plan Note (Signed)
BG slightly elevated after recent steroid injection. Check A1c with labs. Continue current medication.

## 2015-01-23 NOTE — Progress Notes (Signed)
JOCILYN, TREGO ([id number]) Visit Report for 01/23/2015 Chief Complaint Document Details Patient Name: [name], [name]. Date of Service: 01/23/2015 11:00 AM Medical Record Number: [id number] Patient Account Number: [id number] Date of Birth/Sex: 26-May-1929 (81 y.o. Female) Treating RN: Primary Care Physician: Ronna Polio Other Clinician: Referring Physician: Ronna Polio Treating Physician/Extender: Rudene Re in Treatment: 11 Information Obtained from: Patient Chief Complaint Abdominal wall ulceration. This has been a chronic wound from a previous surgery 12 years ago. She has been seen in the wound clinic for a long while and she feels that she is doing much better now. No fresh issues. 11/07/2014 -- She was not here for the last 2 weeks because she had a mini stroke and was under treatment for this. other than that she's been doing fine as far as her abdomen goes and she has some home health nurses coming to help her with her dressing. Electronic Signature(s) Signed: 01/23/2015 12:18:49 PM By: Evlyn Kanner MD, FACS Entered By: Evlyn Kanner on 01/23/2015 11:47:31 [name], [name] ([id number]) -------------------------------------------------------------------------------- Debridement Details Patient Name: [name] Date of Service: 01/23/2015 11:00 AM Medical Record Number: [id number] Patient Account Number: [id number] Date of Birth/Sex: 1928/12/08 (81 y.o. Female) Treating RN: Primary Care Physician: Ronna Polio Other Clinician: Referring Physician: Ronna Polio Treating Physician/Extender: Rudene Re in Treatment: 57 Debridement Performed for Wound #2 Abdomen - midline Assessment: Performed By: Physician Tristan Schroeder., MD Debridement: Open Wound/Selective Debridement Selective Description: Pre-procedure Yes Verification/Time Out Taken: Start Time: 11:30 Pain Control: Lidocaine 4% Topical Solution Level: Non-Viable Tissue Total Area  Debrided (L x 1.5 (cm) x 2 (cm) = 3 (cm) W): Tissue and other Non-Viable, Exudate, Fibrin/Slough, Subcutaneous material debrided: Instrument: Forceps Bleeding: Minimum Hemostasis Achieved: Pressure End Time: 11:32 Procedural Pain: 0 Post Procedural Pain: 0 Response to Treatment: Procedure was tolerated well Post Debridement Measurements of Total Wound Length: (cm) 1.5 Width: (cm) 2 Depth: (cm) 0.1 Volume: (cm) 0.236 Electronic Signature(s) Signed: 01/23/2015 12:18:49 PM By: Evlyn Kanner MD, FACS Entered By: Evlyn Kanner on 01/23/2015 11:47:22 [name], [name] ([id number]) -------------------------------------------------------------------------------- HPI Details Patient Name: [name] Date of Service: 01/23/2015 11:00 AM Medical Record Number: [id number] Patient Account Number: [id number] Date of Birth/Sex: Mar 24, 1929 (81 y.o. Female) Treating RN: Primary Care Physician: Ronna Polio Other Clinician: Referring Physician: Ronna Polio Treating Physician/Extender: Rudene Re in Treatment: 48 History of Present Illness HPI Description: Pleasant 81 year old with h/o ex lap for pancreatitis, DM, CHF. returns to clinic for evaluation of her open abdominal wound. She has been applying silver alginate. She is without complaints today. No significant pain. No drainage. No fever or chills. Tolerating a regular diet and having regular bowel movements. 10/17/14 -- she has no fresh complaints and she and her daughter thinks that everything is going fine. She says most of the areas have healed nicely. 11/28/2014 -- she has generally been doing fine and has no fresh issues. Wound care nurses will be able to come only twice a week now and she was wondering how her dressings would be done. 01/23/2015 - home health has been doing the dressings 3 times a week and she is doing very well and most of her wounds have healed. she has no new complaints. Electronic  Signature(s) Signed: 01/23/2015 12:18:49 PM By: Evlyn Kanner MD, FACS Entered By: Evlyn Kanner on 01/23/2015 11:48:11 [name], [name] ([id number]) -------------------------------------------------------------------------------- Physical Exam Details Patient Name: [name] Date of Service: 01/23/2015 11:00 AM Medical Record Number: [id number] Patient Account Number: [id number]  Date of Birth/Sex: Nov 27, 1928 (81 y.o. Female) Treating RN: Primary Care Physician: Ronna Polio Other Clinician: Referring Physician: Ronna Polio Treating Physician/Extender: Rudene Re in Treatment: 39 Constitutional . Pulse regular. Respirations normal and unlabored. Afebrile. . Eyes Nonicteric. Reactive to light. Ears, Nose, Mouth, and Throat Lips, teeth, and gums WNL.Marland Kitchen Moist mucosa without lesions . Neck supple and nontender. No palpable supraclavicular or cervical adenopathy. Normal sized without goiter. Respiratory WNL. No retractions.. Cardiovascular Pedal Pulses WNL. No clubbing, cyanosis or edema. Gastrointestinal (GI) the mid abdomen area where there was previous scar tissue has some eschar but under that most of the areas have completely healed. The mesh is palpable under this tissue.Marland Kitchen No liver or spleen enlargement or tenderness.. Integumentary (Hair, Skin) No suspicious lesions. No crepitus or fluctuance. No peri-wound warmth or erythema. No masses.Marland Kitchen Psychiatric Judgement and insight Intact.. No evidence of depression, anxiety, or agitation.. Electronic Signature(s) Signed: 01/23/2015 12:18:49 PM By: Evlyn Kanner MD, FACS Entered By: Evlyn Kanner on 01/23/2015 11:49:09 [name], [name] ([id number]) -------------------------------------------------------------------------------- Physician Orders Details Patient Name: [name] Date of Service: 01/23/2015 11:00 AM Medical Record Patient Account Number: [id number] Number: Afful, RN, BSN, Treating  RN: [birth year] (81 y.o. [name] Date of Birth/Sex: Female) Other Clinician: Primary Care Physician: Ronna Polio Treating Evlyn Kanner Referring Physician: Ronna Polio Physician/Extender: Tania Ade in Treatment: 6 Verbal / Phone Orders: Yes Clinician: Afful, RN, BSN, Rita Read Back and Verified: Yes Diagnosis Coding Wound Cleansing Wound #2 Abdomen - midline o Cleanse wound with mild soap and water o May Shower, gently pat wound dry prior to applying new dressing. Primary Wound Dressing Wound #2 Abdomen - midline o Acticoat 7 o Aquacel Ag - use in clinic x1 Secondary Dressing Wound #2 Abdomen - midline o Boardered Foam Dressing - Large BFD Dressing Change Frequency Wound #2 Abdomen - midline o Dressing is to be changed Monday and Thursday. Follow-up Appointments Wound #2 Abdomen - midline o Return Appointment in 1 month - per patient request, she has too many MD appointments. Home Health Wound #2 Abdomen - midline o Initiate Home Health for Skilled Nursing - Continue [facility] o Home Health Nurse may visit PRN to address patientos wound care needs. o FACE TO FACE ENCOUNTER: MEDICARE and MEDICAID PATIENTS: I certify that this patient is under my care and that I had a face-to-face encounter that meets the physician face-to-face encounter requirements with this patient on this date. The encounter with the patient was in whole or in part for the following MEDICAL CONDITION: (primary reason for Home Healthcare) MEDICAL NECESSITY: I certify, that based on my findings, NURSING services are a medically necessary home health service. HOME BOUND STATUS: I certify that my clinical findings support that this patient is homebound (i.e., Due to illness or injury, pt requires aid of supportive devices such as crutches, cane, wheelchairs, walkers, the use of special Kelemen, Jeane O. ([id number]) transportation or the assistance of another person to leave their place of  residence. There is a normal inability to leave the home and doing so requires considerable and taxing effort. Other absences are for medical reasons / religious services and are infrequent or of short duration when for other reasons). o If current dressing causes regression in wound condition, may D/C ordered dressing product/s and apply Normal Saline Moist Dressing daily until next Wound Healing Center / Other MD appointment. Notify Wound Healing Center of regression in wound condition at [phone]. o Please direct any NON-WOUND related issues/requests for orders to patient's Primary  Care Physician Electronic Signature(s) Signed: 01/23/2015 11:39:30 AM By: Elpidio EricAfful, Rita BSN, RN Signed: 01/23/2015 12:18:49 PM By: Evlyn KannerBritto, Lindon Kiel MD, FACS Entered By: Elpidio EricAfful, Rita on 01/23/2015 11:39:29 [name] SpringLLOYD, [name] ([id number]) -------------------------------------------------------------------------------- Problem List Details Patient Name: [name] SpringLLOYD, [name] Date of Service: 01/23/2015 11:00 AM Medical Record Number: [id number] Patient Account Number: [id number] Date of Birth/Sex: 09/06/1928 (81 y.o. Female) Treating RN: Primary Care Physician: Ronna PolioWalker, [name] Other Clinician: Referring Physician: Ronna PolioWalker, [name] Treating Physician/Extender: Rudene ReBritto, Shagun Wordell Weeks in Treatment: 2068 Active Problems ICD-9 Encounter Code Description Active Date Diagnosis 879.2 Open Wound - Abdominal Wall, anterior, w/o mention of 09/30/2013 Yes complications 249.00 Secondary diabetes mellitus without mention of 09/30/2013 Yes complication; not stated as uncontrolled, or unspecified 428.0 Congestive heart failure, unspecified 09/30/2013 Yes 414.9 Coronary atherosclerosis; Chronic ischemic heart disease, 09/30/2013 Yes unspecified 401.1 Essential Hypertension- Benign 09/30/2013 Yes ICD-10 Encounter Code Description Active Date Diagnosis S31.102S Unspecified open wound of abdominal wall, epigastric 09/05/2014 Yes region without  penetration into peritoneal cavity, sequela E11.622 Type 2 diabetes mellitus with other skin ulcer 09/05/2014 Yes Related ICD-9 Code: 879.2 - Open Wound - Abdominal Wall, anterior, w/o mention of complications I50.20 Unspecified systolic (congestive) heart failure 09/05/2014 Yes E66.9 Obesity, unspecified 09/05/2014 Yes [name], [name] ([id number]) S31.105D Unspecified open wound of abdominal wall, periumbilic 10/17/2014 Yes region without penetration into peritoneal cavity, subsequent encounter Inactive Problems Resolved Problems Electronic Signature(s) Signed: 01/23/2015 12:18:49 PM By: Evlyn KannerBritto, Omunique Pederson MD, FACS Entered By: Evlyn KannerBritto, Toneshia Coello on 01/23/2015 11:47:11 Pasquini, [name] ([id number]) -------------------------------------------------------------------------------- Progress Note Details Patient Name: [name] SpringLLOYD, Chelcy O. Date of Service: 01/23/2015 11:00 AM Medical Record Number: [id number] Patient Account Number: [id number] Date of Birth/Sex: 11/11/1928 (81 y.o. Female) Treating RN: Primary Care Physician: Ronna PolioWalker, [name] Other Clinician: Referring Physician: Ronna PolioWalker, [name] Treating Physician/Extender: Rudene ReBritto, Illana Nolting Weeks in Treatment: 5068 Subjective Chief Complaint Information obtained from Patient Abdominal wall ulceration. This has been a chronic wound from a previous surgery 12 years ago. She has been seen in the wound clinic for a long while and she feels that she is doing much better now. No fresh issues. 11/07/2014 -- She was not here for the last 2 weeks because she had a mini stroke and was under treatment for this. other than that she's been doing fine as far as her abdomen goes and she has some home health nurses coming to help her with her dressing. History of Present Illness (HPI) Pleasant 81 year old with h/o ex lap for pancreatitis, DM, CHF. returns to clinic for evaluation of her open abdominal wound. She has been applying silver alginate. She is without complaints today.  No significant pain. No drainage. No fever or chills. Tolerating a regular diet and having regular bowel movements. 10/17/14 -- she has no fresh complaints and she and her daughter thinks that everything is going fine. She says most of the areas have healed nicely. 11/28/2014 -- she has generally been doing fine and has no fresh issues. Wound care nurses will be able to come only twice a week now and she was wondering how her dressings would be done. 01/23/2015 - home health has been doing the dressings 3 times a week and she is doing very well and most of her wounds have healed. she has no new complaints. Objective Constitutional Pulse regular. Respirations normal and unlabored. Afebrile. Vitals Time Taken: 11:18 AM, Height: 59 in, Weight: 181 lbs, BMI: 36.6, Temperature: 97.6 F, Pulse: 56 bpm, Respiratory Rate: 17 breaths/min, Blood Pressure: 148/86 mmHg. Eyes Nonicteric. Reactive to light. [name] SpringLLOYD, [name] ([id number])  Ears, Nose, Mouth, and Throat Lips, teeth, and gums WNL.Marland Kitchen Moist mucosa without lesions . Neck supple and nontender. No palpable supraclavicular or cervical adenopathy. Normal sized without goiter. Respiratory WNL. No retractions.. Cardiovascular Pedal Pulses WNL. No clubbing, cyanosis or edema. Gastrointestinal (GI) the mid abdomen area where there was previous scar tissue has some eschar but under that most of the areas have completely healed. The mesh is palpable under this tissue.Marland Kitchen No liver or spleen enlargement or tenderness.Marland Kitchen Psychiatric Judgement and insight Intact.. No evidence of depression, anxiety, or agitation.. Integumentary (Hair, Skin) No suspicious lesions. No crepitus or fluctuance. No peri-wound warmth or erythema. No masses.. Wound #2 status is Open. Original cause of wound was Surgical Injury. The wound is located on the Abdomen - midline. The wound measures 1.5cm length x 2cm width x 0.1cm depth; 2.356cm^2 area and 0.236cm^3 volume. The wound is  limited to skin breakdown. There is a small amount of serosanguineous drainage noted. The wound margin is indistinct and nonvisible. There is large (67-100%) granulation within the wound bed. There is a small (1-33%) amount of necrotic tissue within the wound bed including Adherent Slough. The periwound skin appearance exhibited: Scarring, Dry/Scaly. The periwound skin appearance did not exhibit: Callus, Crepitus, Excoriation, Fluctuance, Friable, Induration, Localized Edema, Rash, Maceration, Moist, Atrophie Blanche, Cyanosis, Ecchymosis, Hemosiderin Staining, Mottled, Pallor, Rubor, Erythema. Periwound temperature was noted as No Abnormality. Assessment Active Problems ICD-9 879.2 - Open Wound - Abdominal Wall, anterior, w/o mention of complications 249.00 - Secondary diabetes mellitus without mention of complication; not stated as uncontrolled, or unspecified 428.0 - Congestive heart failure, unspecified 414.9 - Coronary atherosclerosis; Chronic ischemic heart disease, unspecified 401.1 - Essential Hypertension- Benign Vowell, [name] O. ([id number]) ICD-10 S31.102S - Unspecified open wound of abdominal wall, epigastric region without penetration into peritoneal cavity, sequela E11.622 - Type 2 diabetes mellitus with other skin ulcer I50.20 - Unspecified systolic (congestive) heart failure E66.9 - Obesity, unspecified S31.105D - Unspecified open wound of abdominal wall, periumbilic region without penetration into peritoneal cavity, subsequent encounter I would continue with Acticoat 7 to which she has been using on a regular basis and depending on the home health sometimes they come 3 times a week and sometimes less often. She will come back and see [place] for review as often as possible. Procedures Wound #2 Wound #2 is an Open Surgical Wound located on the Abdomen - midline . There was a Non-Viable Tissue Open Wound/Selective [phone]) debridement with total area of 3 sq cm performed  by Allaina Brotzman, Ignacia Felling., MD. with the following instrument(s): Forceps to remove Non-Viable tissue/material including Exudate, Fibrin/Slough, and Subcutaneous after achieving pain control using Lidocaine 4% Topical Solution. A time out was conducted prior to the start of the procedure. A Minimum amount of bleeding was controlled with Pressure. The procedure was tolerated well with a pain level of 0 throughout and a pain level of 0 following the procedure. Post Debridement Measurements: 1.5cm length x 2cm width x 0.1cm depth; 0.236cm^3 volume. Plan Wound Cleansing: Wound #2 Abdomen - midline: Cleanse wound with mild soap and water May Shower, gently pat wound dry prior to applying new dressing. Primary Wound Dressing: Wound #2 Abdomen - midline: Acticoat 7 Aquacel Ag - use in clinic x1 Secondary Dressing: Wound #2 Abdomen - midline: Boardered Foam Dressing - Large BFD Dressing Change Frequency: [name], [name] ([id number]) Wound #2 Abdomen - midline: Dressing is to be changed Monday and Thursday. Follow-up Appointments: Wound #2 Abdomen - midline: Return Appointment in 1 month -  per patient request, she has too many MD appointments. Home Health: Wound #2 Abdomen - midline: Initiate Home Health for Skilled Nursing - Continue [facility] Home Health Nurse may visit PRN to address patient s wound care needs. FACE TO FACE ENCOUNTER: MEDICARE and MEDICAID PATIENTS: I certify that this patient is under my care and that I had a face-to-face encounter that meets the physician face-to-face encounter requirements with this patient on this date. The encounter with the patient was in whole or in part for the following MEDICAL CONDITION: (primary reason for Home Healthcare) MEDICAL NECESSITY: I certify, that based on my findings, NURSING services are a medically necessary home health service. HOME BOUND STATUS: I certify that my clinical findings support that this patient is homebound (i.e., Due to illness  or injury, pt requires aid of supportive devices such as crutches, cane, wheelchairs, walkers, the use of special transportation or the assistance of another person to leave their place of residence. There is a normal inability to leave the home and doing so requires considerable and taxing effort. Other absences are for medical reasons / religious services and are infrequent or of short duration when for other reasons). If current dressing causes regression in wound condition, may D/C ordered dressing product/s and apply Normal Saline Moist Dressing daily until next Wound Healing Center / Other MD appointment. Notify Wound Healing Center of regression in wound condition at [phone]. Please direct any NON-WOUND related issues/requests for orders to patient's Primary Care Physician I would continue with Acticoat 7 to which she has been using on a regular basis and depending on the home health sometimes they come 3 times a week and sometimes less often. She will come back and see [place] for review as often as possible. Electronic Signature(s) Signed: 01/23/2015 12:18:49 PM By: Evlyn Kanner MD, FACS Entered By: Evlyn Kanner on 01/23/2015 11:50:05 DHANYA, BOGLE ([id number]) -------------------------------------------------------------------------------- SuperBill Details Patient Name: [name] Date of Service: 01/23/2015 Medical Record Number: [id number] Patient Account Number: [id number] Date of Birth/Sex: 1928/12/29 May 23, 81 y.o. Female) Treating RN: Primary Care Physician: Ronna Polio Other Clinician: Referring Physician: Ronna Polio Treating Physician/Extender: Rudene Re in Treatment: 85 Diagnosis Coding ICD-9 Codes Code Description 879.2 Open Wound - Abdominal Wall, anterior, w/o mention of complications Secondary diabetes mellitus without mention of complication; not stated as uncontrolled, 249.00 or unspecified 428.0 Congestive heart failure, unspecified 414.9  Coronary atherosclerosis; Chronic ischemic heart disease, unspecified 401.1 Essential Hypertension- Benign ICD-10 Codes Code Description Unspecified open wound of abdominal wall, epigastric region without penetration into S31.102S peritoneal cavity, sequela E11.622 Type 2 diabetes mellitus with other skin ulcer Related ICD-9 Code: 879.2 - Open Wound - Abdominal Wall, anterior, w/o mention of complications I50.20 Unspecified systolic (congestive) heart failure E66.9 Obesity, unspecified Unspecified open wound of abdominal wall, periumbilic region without penetration into S31.105D peritoneal cavity, subsequent encounter Facility Procedures CPT4: Description Modifier Quantity Code [id number] [phone] - DEBRIDE WOUND 1ST 20 SQ CM OR < 1 ICD-10 Description Diagnosis S31.102S Unspecified open wound of abdominal wall, epigastric region without penetration into peritoneal cavity, sequela E11.622 Type  2 diabetes mellitus with other skin ulcer I50.20 Unspecified systolic (congestive) heart failure E66.9 Obesity, unspecified Physician Procedures : Hessie Dibble Description: A Val Eagle ([id number]) Modifier: Quantity: Electronic Signature(s) Signed: 01/23/2015 12:18:49 PM By: Evlyn Kanner MD, FACS Entered By: Evlyn Kanner on 01/23/2015 11:50:21

## 2015-01-23 NOTE — Progress Notes (Signed)
Pre visit review using our clinic review tool, if applicable. No additional management support is needed unless otherwise documented below in the visit note. 

## 2015-01-24 NOTE — Progress Notes (Signed)
JANAT, TABBERT ([id number]) Visit Report for 01/23/2015 Arrival Information Details Patient Name: [name], [name]. Date of Service: 01/23/2015 11:00 AM Medical Record Number: [id number] Patient Account Number: [id number] Date of Birth/Sex: [birth year] (81 y.o. Female) Treating RN: Afful, RN, BSN, [name] [place] Primary Care Physician: Ronna Polio Other Clinician: Referring Physician: Ronna Polio Treating Physician/Extender: Rudene Re in Treatment: 45 Visit Information History Since Last Visit Any new allergies or adverse reactions: No Patient Arrived: [name] Had a fall or experienced change in No Arrival Time: 11:18 activities of daily living that may affect Accompanied By: self risk of falls: Transfer Assistance: None Signs or symptoms of abuse/neglect since last No Patient Identification Verified: Yes visito Secondary Verification Process Yes Has Dressing in Place as Prescribed: Yes Completed: Pain Present Now: No Patient Requires Transmission- No Based Precautions: Patient Has Alerts: Yes Patient Alerts: Patient on Blood Thinner Electronic Signature(s) Signed: 01/23/2015 4:29:11 PM By: Elpidio Eric BSN, RN Entered By: Elpidio Eric on 01/23/2015 11:18:33 [name] ([id number]) -------------------------------------------------------------------------------- Encounter Discharge Information Details Patient Name: [name] Date of Service: 01/23/2015 11:00 AM Medical Record Number: [id number] Patient Account Number: [id number] Date of Birth/Sex: [birth year] (81 y.o. Female) Treating RN: Primary Care Physician: Ronna Polio Other Clinician: Referring Physician: Ronna Polio Treating Physician/Extender: Rudene Re in Treatment: 41 Encounter Discharge Information Items Schedule Follow-up Appointment: No Medication Reconciliation completed No and provided to Patient/Care Lowell Makara: Provided on Clinical Summary of Care: 01/23/2015 Form Type  Recipient Paper Patient CL Electronic Signature(s) Signed: 01/23/2015 11:38:38 AM By: Gwenlyn Perking Entered By: Gwenlyn Perking on 01/23/2015 11:38:38 Pendelton, [name] ([id number]) -------------------------------------------------------------------------------- Lower Extremity Assessment Details Patient Name: [name] Date of Service: 01/23/2015 11:00 AM Medical Record Number: [id number] Patient Account Number: [id number] Date of Birth/Sex: 01-29-1929 (81 y.o. Female) Treating RN: Afful, RN, BSN, [place] Primary Care Physician: Ronna Polio Other Clinician: Referring Physician: Ronna Polio Treating Physician/Extender: Rudene Re in Treatment: 62 Electronic Signature(s) Signed: 01/23/2015 4:29:11 PM By: Elpidio Eric BSN, RN Entered By: Elpidio Eric on 01/23/2015 11:19:03 TANICKA, BISAILLON ([id number]) -------------------------------------------------------------------------------- Multi Wound Chart Details Patient Name: [name] Date of Service: 01/23/2015 11:00 AM Medical Record Number: [id number] Patient Account Number: [id number] Date of Birth/Sex: 01/11/1929 (81 y.o. Female) Treating RN: Clover Mealy, RN, BSN, [place] Primary Care Physician: Ronna Polio Other Clinician: Referring Physician: Ronna Polio Treating Physician/Extender: Rudene Re in Treatment: 36 Vital Signs Height(in): 59 Pulse(bpm): 56 Weight(lbs): 181 Blood Pressure 148/86 (mmHg): Body Mass Index(BMI): 37 Temperature(F): 97.6 Respiratory Rate 17 (breaths/min): Photos: [2:No Photos] [N/A:N/A] Wound Location: [2:Abdomen - midline] [N/A:N/A] Wounding Event: [2:Surgical Injury] [N/A:N/A] Primary Etiology: [2:Open Surgical Wound] [N/A:N/A] Comorbid History: [2:Cataracts, Hypertension, Type II Diabetes] [N/A:N/A] Date Acquired: [2:06/23/2003] [N/A:N/A] Weeks of Treatment: [2:68] [N/A:N/A] Wound Status: [2:Open] [N/A:N/A] Measurements L x W x D 1.5x2x0.1 [N/A:N/A] (cm) Area (cm) :  [2:2.356] [N/A:N/A] Volume (cm) : [2:0.236] [N/A:N/A] % Reduction in Area: [2:43.10%] [N/A:N/A] % Reduction in Volume: 43.00% [N/A:N/A] Classification: [2:Full Thickness Without Exposed Support Structures] [N/A:N/A] Exudate Amount: [2:Small] [N/A:N/A] Exudate Type: [2:Serosanguineous] [N/A:N/A] Exudate Color: [2:red, brown] [N/A:N/A] Wound Margin: [2:Indistinct, nonvisible] [N/A:N/A] Granulation Amount: [2:Large (67-100%)] [N/A:N/A] Necrotic Amount: [2:Small (1-33%)] [N/A:N/A] Exposed Structures: [2:Fascia: No Fat: No Tendon: No Muscle: No Joint: No Bone: No] [N/A:N/A] Limited to Skin Breakdown Epithelialization: Medium (34-66%) N/A N/A Debridement: Open Wound/Selective N/A N/A ([id number]) - Selective Time-Out Taken: Yes N/A N/A Pain Control: Lidocaine 4% Topical N/A N/A Solution Tissue Debrided: Fibrin/Slough, Exudates, N/A N/A Subcutaneous Level: Non-Viable Tissue N/A N/A Debridement Area (  sq 3 N/A N/A cm): Instrument: Forceps N/A N/A Bleeding: Minimum N/A N/A Hemostasis Achieved: Pressure N/A N/A Procedural Pain: 0 N/A N/A Post Procedural Pain: 0 N/A N/A Debridement Treatment Procedure was tolerated N/A N/A Response: well Post Debridement 1.5x2x0.1 N/A N/A Measurements L x W x D (cm) Post Debridement 0.236 N/A N/A Volume: (cm) Periwound Skin Texture: Scarring: Yes N/A N/A Edema: No Excoriation: No Induration: No Callus: No Crepitus: No Fluctuance: No Friable: No Rash: No Periwound Skin Dry/Scaly: Yes N/A N/A Moisture: Maceration: No Moist: No Periwound Skin Color: Atrophie Blanche: No N/A N/A Cyanosis: No Ecchymosis: No Erythema: No Hemosiderin Staining: No Mottled: No Pallor: No Rubor: No Temperature: No Abnormality N/A N/A Tenderness on No N/A N/A Palpation: Wound Preparation: Ulcer Cleansing: N/A N/A Rinsed/Irrigated with [name] SpringLLOYD, Calie O. ([id number]) Saline Topical Anesthetic Applied: None Procedures Performed: Debridement N/A  N/A Treatment Notes Electronic Signature(s) Signed: 01/23/2015 4:29:11 PM By: Elpidio EricAfful, Rita BSN, RN Entered By: Elpidio EricAfful, Rita on 01/23/2015 11:35:00 [name] SpringLLOYD, Rejoice O. ([id number]) -------------------------------------------------------------------------------- Multi-Disciplinary Care Plan Details Patient Name: [name] SpringLLOYD, Leinaala O. Date of Service: 01/23/2015 11:00 AM Medical Record Number: [id number] Patient Account Number: [id number] Date of Birth/Sex: 01/10/1929 (81 y.o. Female) Treating RN: Afful, RN, BSN, [place] Primary Care Physician: Ronna PolioWalker, [name] Other Clinician: Referring Physician: Ronna PolioWalker, [name] Treating Physician/Extender: Rudene ReBritto, Errol Weeks in Treatment: 1368 Active Inactive Abuse / Safety / Falls / Self Care Management Nursing Diagnoses: Potential for falls Goals: Patient will remain injury free Date Initiated: 11/04/2013 Goal Status: Active Patient/caregiver will verbalize/demonstrate measures taken to prevent injury and/or falls Date Initiated: 11/04/2013 Goal Status: Active Patient/caregiver will verbalize/demonstrate understanding of what to do in case of emergency Date Initiated: 11/04/2013 Goal Status: Active Interventions: Assess fall risk on admission and as needed Provide education on personal and home safety Notes: Wound/Skin Impairment Nursing Diagnoses: Impaired tissue integrity Goals: Ulcer/skin breakdown will heal within 14 weeks Date Initiated: 09/30/2013 Goal Status: Active Interventions: Provide education on ulcer and skin care Notes: [name] SpringLLOYD, Julieann O. ([id number]) Electronic Signature(s) Signed: 01/23/2015 4:29:11 PM By: Elpidio EricAfful, Rita BSN, RN Entered By: Elpidio EricAfful, Rita on 01/23/2015 11:34:50 Kloehn, Lucile ShuttersLARA O. ([id number]) -------------------------------------------------------------------------------- Pain Assessment Details Patient Name: [name] SpringLLOYD, Keilynn O. Date of Service: 01/23/2015 11:00 AM Medical Record Number: [id number] Patient Account Number: [id number] Date of  Birth/Sex: 09/02/1928 (81 y.o. Female) Treating RN: Clover MealyAfful, RN, BSN, [place]inkita Primary Care Physician: Ronna PolioWalker, [name] Other Clinician: Referring Physician: Ronna PolioWalker, [name] Treating Physician/Extender: Rudene ReBritto, Errol Weeks in Treatment: 6468 Active Problems Location of Pain Severity and Description of Pain Patient Has Paino No Site Locations Pain Management and Medication Current Pain Management: Electronic Signature(s) Signed: 01/23/2015 4:29:11 PM By: Elpidio EricAfful, Rita BSN, RN Entered By: Elpidio EricAfful, Rita on 01/23/2015 11:18:39 Nikolic, Lucile ShuttersLARA O. ([id number]) -------------------------------------------------------------------------------- Wound Assessment Details Patient Name: [name] SpringLLOYD, Mallorie O. Date of Service: 01/23/2015 11:00 AM Medical Record Number: [id number] Patient Account Number: [id number] Date of Birth/Sex: 03/07/1929 (81 y.o. Female) Treating RN: Clover MealyAfful, RN, BSN, [place] Sinkita Primary Care Physician: Ronna PolioWalker, [name] Other Clinician: Referring Physician: Ronna PolioWalker, [name] Treating Physician/Extender: Rudene ReBritto, Errol Weeks in Treatment: 4968 Wound Status Wound Number: 2 Primary Open Surgical Wound Etiology: Wound Location: Abdomen - midline Wound Status: Open Wounding Event: Surgical Injury Comorbid Cataracts, Hypertension, Type II Date Acquired: 06/23/2003 History: Diabetes Weeks Of Treatment: 68 Clustered Wound: No Photos Photo Uploaded By: Elpidio EricAfful, Rita on 01/23/2015 16:27:44 Wound Measurements Length: (cm) 1.5 Width: (cm) 2 Depth: (cm) 0.1 Area: (cm) 2.356 Volume: (cm) 0.236 % Reduction in Area: 43.1% % Reduction in Volume: 43% Epithelialization: Medium (34-66%) Wound Description Full  Thickness Without Exposed Classification: Support Structures Wound Margin: Indistinct, nonvisible Exudate Small Amount: Exudate Type: Serosanguineous Exudate Color: red, brown Foul Odor After Cleansing: No Wound Bed Granulation Amount: Large (67-100%) Exposed Structure Necrotic Amount: Small  (1-33%) Fascia Exposed: No Necrotic Quality: Adherent Slough Fat Layer Exposed: No Littman, Aditi O. ([id number]) Tendon Exposed: No Muscle Exposed: No Joint Exposed: No Bone Exposed: No Limited to Skin Breakdown Periwound Skin Texture Texture Color No Abnormalities Noted: No No Abnormalities Noted: No Callus: No Atrophie Blanche: No Crepitus: No Cyanosis: No Excoriation: No Ecchymosis: No Fluctuance: No Erythema: No Friable: No Hemosiderin Staining: No Induration: No Mottled: No Localized Edema: No Pallor: No Rash: No Rubor: No Scarring: Yes Temperature / Pain Moisture Temperature: No Abnormality No Abnormalities Noted: No Dry / Scaly: Yes Maceration: No Moist: No Wound Preparation Ulcer Cleansing: Rinsed/Irrigated with Saline Topical Anesthetic Applied: None Electronic Signature(s) Signed: 01/23/2015 4:29:11 PM By: Elpidio Eric BSN, RN Entered By: Elpidio Eric on 01/23/2015 11:19:40 [name] ([id number]) -------------------------------------------------------------------------------- Vitals Details Patient Name: [name] Date of Service: 01/23/2015 11:00 AM Medical Record Number: [id number] Patient Account Number: [id number] Date of Birth/Sex: [birth year] (81 y.o. Female) Treating RN: Afful, RN, BSN, Rita Primary Care Physician: Ronna Polio Other Clinician: Referring Physician: Ronna Polio Treating Physician/Extender: Rudene Re in Treatment: 35 Vital Signs Time Taken: 11:18 Temperature (F): 97.6 Height (in): 59 Pulse (bpm): 56 Weight (lbs): 181 Respiratory Rate (breaths/min): 17 Body Mass Index (BMI): 36.6 Blood Pressure (mmHg): 148/86 Reference Range: 80 - 120 mg / dl Electronic Signature(s) Signed: 01/23/2015 4:29:11 PM By: Elpidio Eric BSN, RN Entered By: Elpidio Eric on 01/23/2015 11:18:59

## 2015-01-27 ENCOUNTER — Other Ambulatory Visit: Payer: Self-pay

## 2015-01-27 DIAGNOSIS — R000 Tachycardia, unspecified: Secondary | ICD-10-CM

## 2015-01-27 DIAGNOSIS — R0602 Shortness of breath: Secondary | ICD-10-CM

## 2015-01-27 LAB — CUP PACEART REMOTE DEVICE CHECK: MDC IDC SESS DTM: 20160607120505

## 2015-01-27 MED ORDER — APIXABAN 2.5 MG PO TABS: 2.5000 mg | ORAL_TABLET | Freq: Two times a day (BID) | ORAL | Status: DC

## 2015-01-27 NOTE — Telephone Encounter (Signed)
Refill sent for eliquis 2.5 mg take one tablet twice a day.

## 2015-01-29 ENCOUNTER — Telehealth: Payer: Self-pay | Admitting: *Deleted

## 2015-01-29 NOTE — Telephone Encounter (Signed)
Pt daughter states that the 3 drugs that were discussed this morning,  need prior auth.. Please call. States only has 3 days worth of medicatioin. Pt daughter asks if she can just stay on Plavix, it does not require a prior [place]

## 2015-01-29 NOTE — Telephone Encounter (Signed)
Pt daughter calling stating that patient is now done with the prescription Eliquis  She would like to go back Clopidogrel for the eliquis is a bit expensive.  It did not have any refill so they assume this is what is going to happen  But if not could they just go back on to Clopidogrel. Please advise.

## 2015-01-29 NOTE — Telephone Encounter (Signed)
S/w daughter with Dr. Odessa Fleming recommendations based on 4/26 visit which states:. if there is a cost for Eliquis and this is too expensive, please ask your pharmacist about the cost of two other alternatives: - Pradaxa - Xarelto  Patient's daughter indicates she will call pharmacist today and will notify us which one is cheaper.

## 2015-01-30 ENCOUNTER — Telehealth: Payer: Self-pay

## 2015-01-30 NOTE — Telephone Encounter (Signed)
error 

## 2015-01-30 NOTE — Telephone Encounter (Signed)
S/w patient daughter who indicates she has authorization for eliquis. States she thought she had to go through the authorization process every month but was told it was good for one year. Based on that information, patient is ok with staying on Eliquis. No further questions.

## 2015-02-02 ENCOUNTER — Telehealth: Payer: Self-pay | Admitting: *Deleted

## 2015-02-02 NOTE — Telephone Encounter (Signed)
[place] called to report pts fasting blood sugars this morning as 435.  Rechecked it later it was 119.  Reports fasting readings in the morning above 200.  Further reports pt is no longer seeing Dr Tedd Sias in Endocrinology.  Please advise

## 2015-02-03 NOTE — Telephone Encounter (Signed)
I would recommend follow up with Dr. Tedd Sias. Why has she not recently seen her? Please confirm how much insulin she is taking

## 2015-02-03 NOTE — Telephone Encounter (Signed)
Let's increase Lantus to 50units. Follow up here in visit next week or this week

## 2015-02-03 NOTE — Telephone Encounter (Signed)
Spoke with [name], as per pt she hasn't continued her care with Dr Tedd Sias due to transportation.  Humalog is a sliding scale:  100-180 8 units, 181-200 10 units, 201-250 12 units, 251-300 14 units, 301-350 16 units, >351 18 units.  Lantus is 45 units at bedtime.

## 2015-02-03 NOTE — Telephone Encounter (Signed)
Left message on Homehealth aides VM and spoke with pt.  Pt verbalized understanding of medication change.  Scheduled appoint

## 2015-02-04 ENCOUNTER — Telehealth: Payer: Self-pay | Admitting: *Deleted

## 2015-02-04 ENCOUNTER — Other Ambulatory Visit: Payer: Self-pay | Admitting: Internal Medicine

## 2015-02-04 NOTE — Telephone Encounter (Signed)
Spoke with pts daughter, states pt takes 4 Vicodin daily.  The 6.3.16 was #30.  Please advise

## 2015-02-04 NOTE — Telephone Encounter (Signed)
pts daughter called requesting Vicodin refill.  Last refill 6.3.16.  Please advise refill

## 2015-02-04 NOTE — Telephone Encounter (Signed)
OK. Fine to refill #120

## 2015-02-04 NOTE — Telephone Encounter (Signed)
No. Next refill would be 7/3. Has she been using more Vicodin recently?? This is two weeks early for refill

## 2015-02-05 ENCOUNTER — Telehealth: Payer: Self-pay

## 2015-02-05 ENCOUNTER — Other Ambulatory Visit: Payer: Self-pay | Admitting: *Deleted

## 2015-02-05 DIAGNOSIS — M1710 Unilateral primary osteoarthritis, unspecified knee: Secondary | ICD-10-CM

## 2015-02-05 DIAGNOSIS — IMO0002 Reserved for concepts with insufficient information to code with codable children: Secondary | ICD-10-CM

## 2015-02-05 MED ORDER — HYDROCODONE-ACETAMINOPHEN 5-325 MG PO TABS: 1.0000 | ORAL_TABLET | Freq: Three times a day (TID) | ORAL | Status: DC | PRN

## 2015-02-05 NOTE — Telephone Encounter (Signed)
Called and left message for patient to pick up Rx at front desk.  Pharmacy will not accept via fax.

## 2015-02-05 NOTE — Telephone Encounter (Signed)
Spoke with pts daughter, advised Rx ready for pick up

## 2015-02-08 ENCOUNTER — Encounter: Payer: Self-pay | Admitting: Emergency Medicine

## 2015-02-08 ENCOUNTER — Emergency Department
Admission: EM | Admit: 2015-02-08 | Discharge: 2015-02-08 | Disposition: A | Discharge status: Home / Self Care | Payer: Medicare Other | Attending: Emergency Medicine | Admitting: Emergency Medicine

## 2015-02-08 DIAGNOSIS — Z9889 Other specified postprocedural states: Secondary | ICD-10-CM | POA: Diagnosis not present

## 2015-02-08 DIAGNOSIS — Z792 Long term (current) use of antibiotics: Secondary | ICD-10-CM | POA: Diagnosis not present

## 2015-02-08 DIAGNOSIS — Z7902 Long term (current) use of antithrombotics/antiplatelets: Secondary | ICD-10-CM | POA: Insufficient documentation

## 2015-02-08 DIAGNOSIS — I10 Essential (primary) hypertension: Secondary | ICD-10-CM | POA: Insufficient documentation

## 2015-02-08 DIAGNOSIS — K91840 Postprocedural hemorrhage and hematoma of a digestive system organ or structure following a digestive system procedure: Secondary | ICD-10-CM | POA: Diagnosis not present

## 2015-02-08 DIAGNOSIS — Z79899 Other long term (current) drug therapy: Secondary | ICD-10-CM | POA: Diagnosis not present

## 2015-02-08 DIAGNOSIS — Z794 Long term (current) use of insulin: Secondary | ICD-10-CM | POA: Insufficient documentation

## 2015-02-08 DIAGNOSIS — E119 Type 2 diabetes mellitus without complications: Secondary | ICD-10-CM | POA: Diagnosis not present

## 2015-02-08 DIAGNOSIS — Z7982 Long term (current) use of aspirin: Secondary | ICD-10-CM | POA: Insufficient documentation

## 2015-02-08 DIAGNOSIS — T148XXA Other injury of unspecified body region, initial encounter: Secondary | ICD-10-CM

## 2015-02-08 DIAGNOSIS — Z7951 Long term (current) use of inhaled steroids: Secondary | ICD-10-CM | POA: Diagnosis not present

## 2015-02-08 DIAGNOSIS — Z4801 Encounter for change or removal of surgical wound dressing: Secondary | ICD-10-CM | POA: Diagnosis present

## 2015-02-08 LAB — CBC
HCT: 36.3 % (ref 35.0–47.0)
Hemoglobin: 11.6 g/dL — ABNORMAL LOW (ref 12.0–16.0)
MCH: 30.6 pg (ref 26.0–34.0)
MCHC: 32.1 g/dL (ref 32.0–36.0)
MCV: 95.4 fL (ref 80.0–100.0)
PLATELETS: 113 10*3/uL — AB (ref 150–440)
RBC: 3.81 MIL/uL (ref 3.80–5.20)
RDW: 13.9 % (ref 11.5–14.5)
WBC: 6.2 10*3/uL (ref 3.6–11.0)

## 2015-02-08 LAB — PROTIME-INR
INR: 1.16
Prothrombin Time: 15 seconds (ref 11.4–15.0)

## 2015-02-08 LAB — BASIC METABOLIC PANEL
Anion gap: 8 (ref 5–15)
BUN: 36 mg/dL — ABNORMAL HIGH (ref 6–20)
CALCIUM: 8.6 mg/dL — AB (ref 8.9–10.3)
CO2: 28 mmol/L (ref 22–32)
CREATININE: 1.8 mg/dL — AB (ref 0.44–1.00)
Chloride: 104 mmol/L (ref 101–111)
GFR calc Af Amer: 29 mL/min — ABNORMAL LOW (ref >60)
GFR, EST NON AFRICAN AMERICAN: 25 mL/min — AB (ref >60)
Glucose, Bld: 405 mg/dL — ABNORMAL HIGH (ref 65–99)
Potassium: 5 mmol/L (ref 3.5–5.1)
Sodium: 140 mmol/L (ref 135–145)

## 2015-02-08 NOTE — ED Notes (Addendum)
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 Chronic wound with home health dressing change 3x/week. Patient says yesterday the site started bleeding. Able to get bleeding to stop at home. Site started bleeding again today. No bleeding at this time. Patient states she is on blood thinner but can't remember which kind.

## 2015-02-08 NOTE — ED Provider Notes (Signed)
[facility] Emergency Department Provider Note  Time seen: 1:33 PM  I have reviewed the triage vital signs and the nursing notes.   HISTORY  Chief Complaint Wound Check    HPI [name] is a 81 y.o. female with a past medical history of congestive heart layer, diabetes, hypertension, hyperlipidemia, TIAs, pancreatic surgery 12 years ago currently on a blood thinner (Eliquis) who presents the emergency department with wound bleeding. According to the patient she is a chronic wound from her pancreatic surgery 12 years ago. She noted last night that there was some bleeding from the wound which is abnormal for her. She was able to have the bleeding stopped last night, but noted this morning that it is bleeding again so the patient came to the emergency department for evaluation. Before arrival to the emergency department the patient's bleeding had stopped spontaneously. Patient denies any other symptoms. Denies any pain, dizziness, lightheadedness. She states she is concerned over the amount of blood lost and wishes to have her blood levels checked.     Past Medical History  Diagnosis Date  . Anxiety   . CHF (congestive heart failure)   . Depression   . Diabetes mellitus     Followed by Dr. Tedd Sias at [facility]  . GERD (gastroesophageal reflux disease)   . Hypertension   . Hypothyroidism   . Osteoporosis   . Allergy   . Hyperlipidemia   . RLS (restless legs syndrome)   . Pancreatic disease     pancreatic failure  . Urinary incontinence   . Pancreatitis     Pancreatic resection/ open wound- [facility] 11/04  . Pneumonia     Pneumonia/ Emphysema 05/05  . 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 TIA (transient ischemic attack)     recurrent 12/15  . History of pleural empyema   . Sepsis   . Frequent falls   . Arthritis     left knee, [facility]  . CAD (coronary artery disease), native coronary artery      1//15  T  mid-LAD  stent OM2  . Parkinson's disease     Patient Active Problem List   Diagnosis Date Noted  . Dysphagia, pharyngoesophageal phase 01/23/2015  . Urinary frequency 01/01/2015  . Malaise 11/13/2014  . Memory loss 10/23/2014  . CVA (cerebral infarction) 08/07/2014  . Left rotator cuff tear 08/07/2014  . Coronary artery disease 09/12/2013  . Dyspnea 06/20/2013  . Tremor 06/07/2012  . Osteoarthrosis, unspecified whether generalized or localized, involving lower leg 02/27/2012  . Open abdominal wall wound 04/12/2010  . THROMBOCYTOPENIA 07/30/2009  . Hyperlipidemia 10/27/2008  . ALLERGIC RHINITIS 07/21/2008  . HYPOTHYROIDISM 03/07/2007  . Diabetes type 2, controlled 03/07/2007  . ANXIETY 03/07/2007  . DEPRESSION 03/07/2007  . Essential hypertension 03/07/2007  . FAILURE, DIASTOLIC HEART, CHRONIC 03/07/2007  . GERD 03/07/2007    Past Surgical History  Procedure Laterality Date  . Breast lumpectomy      Lumpectomy right breast  . 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 Abdominal hysterectomy      Hysteroscopy/ D & C ([facility]) 12/05  . Removal of pancreas  2004  . Kyphoplasty  2005  . Broken shoulder and orbital bone  2005  . Cholecystectomy  1973  . Thoracentesis    . Vaginal delivery      7  . Cardiac catheterization  08/29/2013  x1 stent @ [facility]  . Cataract surgery      Current Outpatient Rx  Name  Route  Sig  Dispense  Refill  . ALPRAZolam (XANAX) 1 MG tablet      TAKE 1/2 TABLET IN THE MORNING TAKE 1/2 TABLET AT LUNCH AND TAKE 1 TABLET AT BEDTIME IF NEEDED   60 tablet   5   . amLODipine (NORVASC) 5 MG tablet   Oral   Take 1 tablet (5 mg total) by mouth daily.   30 tablet   3   . apixaban (ELIQUIS) 2.5 MG TABS tablet   Oral   Take 1 tablet (2.5 mg total) by mouth 2 (two) times daily.   60 tablet   6   . aspirin 81 MG tablet   Oral   Take 81 mg by mouth daily.           . busPIRone (BUSPAR) 10 MG  tablet      TAKE ONE (1) TABLET BY MOUTH TWO (2) TIMES DAILY   180 tablet   0   . Carbidopa-Levodopa ER (SINEMET CR) 25-100 MG tablet controlled release      TAKE ONE (1) TABLET BY MOUTH 3 TIMES DAILY   270 tablet   1   . cetirizine (ZYRTEC) 10 MG tablet   Oral   Take 10 mg by mouth daily.         . fluticasone (FLONASE) 50 MCG/ACT nasal spray      USE 2 SPRAYS EACH NOSTRIL DAILY   16 g   3   . furosemide (LASIX) 40 MG tablet   Oral   Take 1 tablet (40 mg total) by mouth daily.   90 tablet   3   . gabapentin (NEURONTIN) 100 MG capsule   Oral   Take 1 capsule (100 mg total) by mouth 3 (three) times daily.   90 capsule   3   . gentamicin ointment (GARAMYCIN) 0.1 %   Topical   Apply 1 application topically 3 (three) times daily.   30 g   0   . HYDROcodone-acetaminophen (NORCO/VICODIN) 5-325 MG per tablet   Oral   Take 1-2 tablets by mouth 3 (three) times daily as needed for moderate pain.   120 tablet   0   . insulin glargine (LANTUS) 100 UNIT/ML injection   Subcutaneous   Inject 0.45 mLs (45 Units total) into the skin at bedtime.   10 mL   6     Patient prefers NOT to use pens   . insulin lispro (HUMALOG) 100 UNIT/ML injection   Subcutaneous   Inject 0.08-0.18 mLs (8-18 Units total) into the skin 3 (three) times daily before meals.   10 mL   6   . levothyroxine (SYNTHROID, LEVOTHROID) 75 MCG tablet      TAKE ONE (1) TABLET EACH DAY   90 tablet   1   . lisinopril (PRINIVIL,ZESTRIL) 20 MG tablet      TAKE ONE TABLET TWICE DAILY   180 tablet   1   . metoprolol (TOPROL-XL) 200 MG 24 hr tablet   Oral   Take 1 tablet (200 mg total) by mouth daily.   90 tablet   3   . Multiple Vitamins-Minerals (CENTRUM SILVER PO)   Oral   Take 1 tablet by mouth daily.           Marland Kitchen omeprazole (PRILOSEC) 20 MG capsule      TAKE ONE CAPSULE BY MOUTH DAILY  30 capsule   6   . ondansetron (ZOFRAN) 4 MG tablet   Oral   Take 1 tablet (4 mg total) by mouth  every 8 (eight) hours as needed.   90 tablet   11   . pravastatin (PRAVACHOL) 40 MG tablet   Oral   Take 1 tablet (40 mg total) by mouth daily.   30 tablet   5   . PROAIR HFA 108 (90 BASE) MCG/ACT inhaler      INHALE TWO PUFFS EVERY 6 HOURS AS NEEDEDFOR WHEEZE OR SHORTNESS OF BREATH   8.5 g   3   . rOPINIRole (REQUIP) 0.25 MG tablet   Oral   Take 0.25 mg by mouth at bedtime.         . triamcinolone cream (KENALOG) 0.1 %   Topical   Apply topically 2 (two) times daily.   30 g   0   . ULTICARE INSULIN SYRINGE 31G X 5/16" 0.5 ML MISC      AS DIRECTED   100 each   6   . ZENPEP 15000 UNITS CPEP      TAKE ONE (1) CAPSULE THREE (3) TIMES EACH DAY   100 capsule   6     Allergies Citalopram hydrobromide; Diphenhydramine; Diphenhydramine hcl; Metoclopramide; Metoclopramide hcl; Paroxetine; and Tape  Family History  Problem Relation Age of Onset  . Heart disease Mother   . Heart attack Mother   . Hypertension Mother   . Heart attack Father     Social History History  Substance Use Topics  . Smoking status: Never Smoker   . Smokeless tobacco: Never Used  . Alcohol Use: No    Review of Systems Constitutional: Negative for fever. Negative for dizziness. Negative for lightheadedness. Cardiovascular: Negative for chest pain. Respiratory: Negative for shortness of breath. Gastrointestinal: Negative for abdominal pain Musculoskeletal: Negative for back pain.  10-point ROS otherwise negative.  ____________________________________________   PHYSICAL EXAM:  VITAL SIGNS: ED Triage Vitals  Enc Vitals Group     BP 02/08/15 1312 109/52 mmHg     Pulse Rate 02/08/15 1312 77     Resp 02/08/15 1312 16     Temp 02/08/15 1312 98.1 F (36.7 C)     Temp Source 02/08/15 1312 Oral     SpO2 02/08/15 1312 97 %     Weight 02/08/15 1312 182 lb (82.555 kg)     Height 02/08/15 1312 4\' 11"  (1.499 m)     Head Cir --      Peak Flow --      Pain Score --      Pain Loc --       Pain Edu? --      Excl. in GC? --     Constitutional: Alert and oriented. Well appearing and in no distress. ENT   Mouth/Throat: Mucous membranes are moist. Cardiovascular: Normal rate, regular rhythm.  Respiratory: Normal respiratory effort without tachypnea nor retractions. Breath sounds are clear Gastrointestinal: Soft and nontender. No distention.  Large approximately 8 x 10 cm chronic wound to her epigastrium which is largely healed. There are several areas of abrasion, which is likely the source of her bleed. No active bleeding at this time. No signs of infection. Genitourinary:  Musculoskeletal: Nontender with normal range of motion in all extremities.  Neurologic:  Normal speech and language. No gross focal neurologic deficits  Skin:  Skin is warm, dry and intact.  Psychiatric: Mood and affect are normal. Speech and behavior are  normal.   ____________________________________________    INITIAL IMPRESSION / ASSESSMENT AND PLAN / ED COURSE  Pertinent labs & imaging results that were available during my care of the patient were reviewed by me and considered in my medical decision making (see chart for details).  Wound appears very well currently. We will continue to closely monitor for any rebleeding. There does appear to be an area of abrasion on the wound, which was likely the source of the bleed. No active bleeding. We will check labs to further evaluate.   ----------------------------------------- 2:24 PM on 02/08/2015 ----------------------------------------- Labs appear to be within normal limits. No rebleeding of the site. We will redress it really did discharge patient home. Patient agreeable plan.   ____________________________________________   FINAL CLINICAL IMPRESSION(S) / ED DIAGNOSES  Bleeding chronic wound   Minna Antis, MD 02/08/15 1425

## 2015-02-10 ENCOUNTER — Encounter: Payer: Self-pay | Admitting: Internal Medicine

## 2015-02-10 ENCOUNTER — Ambulatory Visit (INDEPENDENT_AMBULATORY_CARE_PROVIDER_SITE_OTHER): Payer: Medicare Other | Admitting: Internal Medicine

## 2015-02-10 VITALS — BP 100/62 | HR 68 | Temp 97.8°F | Resp 14 | Ht 59.0 in | Wt 180.8 lb

## 2015-02-10 DIAGNOSIS — I638 Other cerebral infarction: Secondary | ICD-10-CM | POA: Diagnosis not present

## 2015-02-10 DIAGNOSIS — M179 Osteoarthritis of knee, unspecified: Secondary | ICD-10-CM | POA: Diagnosis not present

## 2015-02-10 DIAGNOSIS — E119 Type 2 diabetes mellitus without complications: Secondary | ICD-10-CM | POA: Diagnosis not present

## 2015-02-10 DIAGNOSIS — IMO0002 Reserved for concepts with insufficient information to code with codable children: Secondary | ICD-10-CM

## 2015-02-10 DIAGNOSIS — N179 Acute kidney failure, unspecified: Secondary | ICD-10-CM | POA: Diagnosis not present

## 2015-02-10 DIAGNOSIS — R399 Unspecified symptoms and signs involving the genitourinary system: Secondary | ICD-10-CM

## 2015-02-10 DIAGNOSIS — M1710 Unilateral primary osteoarthritis, unspecified knee: Secondary | ICD-10-CM

## 2015-02-10 DIAGNOSIS — N189 Chronic kidney disease, unspecified: Secondary | ICD-10-CM

## 2015-02-10 LAB — POCT URINALYSIS DIPSTICK
Bilirubin, UA: NEGATIVE
GLUCOSE UA: NEGATIVE
Ketones, UA: NEGATIVE
Leukocytes, UA: NEGATIVE
Nitrite, UA: NEGATIVE
PH UA: 5
PROTEIN UA: NEGATIVE
RBC UA: NEGATIVE
SPEC GRAV UA: 1.01
UROBILINOGEN UA: 0.2

## 2015-02-10 MED ORDER — INSULIN GLARGINE 100 UNIT/ML ~~LOC~~ SOLN: 55.0000 [IU] | Freq: Every day | SUBCUTANEOUS | Status: DC

## 2015-02-10 NOTE — Assessment & Plan Note (Signed)
Severe bilateral knee OA with chronic pain. Unable to take NSAIDS. No improvement with Tylenol, Tramadol. Currently using Vicodin 4 tabs daily. Controlled substance contract on file. UDS today. Discussed risks of chronic narcotic use.

## 2015-02-10 NOTE — Assessment & Plan Note (Signed)
BG have been extremely high after cortisone injection. Will have her increase Lantus to 55 units daily. Follow up in 4 weeks and prn.

## 2015-02-10 NOTE — Assessment & Plan Note (Signed)
Acute on chronic renal failure on recent labs in ED. Will recheck renal function today. Encouraged fluid intake.

## 2015-02-10 NOTE — Patient Instructions (Signed)
Labs today.  Please increase fluid intake.  Increase Lantus to 55 units daily to help control blood sugar.

## 2015-02-10 NOTE — Progress Notes (Signed)
Subjective:    Patient ID: [name], female    DOB: 1929/01/22, 81 y.o.   MRN: [id number]  HPI  81YO female presents for follow up.  Recently seen in the ED 6/19 for wound bleeding. Wound check in ED was normal.   Sunday morning had issues with bleeding from her wound after falling in bathroom. Went to ED Sunday. Labs were remarkable for BG of 405 and elevated Cr at 1.8  Feeling very weak and tired. No focal symptoms. No chest pain. Chronic dyspnea with exertion unchanged.  DM - BG have been running near 500 even on Lantus 50units daily. BG have been over 200 since having cortisone injection.  Chronic arthritis pain- Taking 2 Vicodin in the morning, 1 at noon and 1 at bedtime. Pain typically 8/10 at worst, never 0/10. Pain is mostly in knees, left>right. Aching pain.   Past medical, surgical, family and social history per today's encounter.  Review of Systems  Constitutional: Positive for fatigue. Negative for fever, chills, appetite change and unexpected weight change.  Eyes: Negative for visual disturbance.  Respiratory: Negative for shortness of breath.   Cardiovascular: Negative for chest pain and leg swelling.  Gastrointestinal: Negative for nausea, vomiting, abdominal pain, diarrhea and constipation.  Musculoskeletal: Positive for myalgias, back pain, arthralgias and gait problem.  Skin: Negative for color change and rash.  Neurological: Positive for weakness.  Hematological: Negative for adenopathy. Does not bruise/bleed easily.  Psychiatric/Behavioral: Negative for dysphoric mood. The patient is not nervous/anxious.        Objective:    BP 100/62 mmHg  Pulse 68  Temp(Src) 97.8 F (36.6 C)  Resp 14  Ht 4\' 11"  (1.499 m)  Wt 180 lb 12.8 oz (82.01 kg)  BMI 36.50 kg/m2  SpO2 94% Physical Exam  Constitutional: She is oriented to person, place, and time. She appears well-developed and well-nourished. She has a sickly appearance. No distress.  HENT:  Head:  Normocephalic and atraumatic.  Right Ear: External ear normal.  Left Ear: External ear normal.  Nose: Nose normal.  Mouth/Throat: Oropharynx is clear and moist. No oropharyngeal exudate.  Eyes: Conjunctivae are normal. Pupils are equal, round, and reactive to light. Right eye exhibits no discharge. Left eye exhibits no discharge. No scleral icterus.  Neck: Normal range of motion. Neck supple. No tracheal deviation present. No thyromegaly present.  Cardiovascular: Normal rate, regular rhythm, normal heart sounds and intact distal pulses.  Exam reveals no gallop and no friction rub.   No murmur heard. Pulmonary/Chest: Effort normal and breath sounds normal. No respiratory distress. She has no wheezes. She has no rales. She exhibits no tenderness.  Musculoskeletal: Normal range of motion. She exhibits no edema or tenderness.  Lymphadenopathy:    She has no cervical adenopathy.  Neurological: She is alert and oriented to person, place, and time. No cranial nerve deficit. She exhibits normal muscle tone. Coordination normal.  Skin: Skin is warm and dry. No rash noted. She is not diaphoretic. No erythema. No pallor.  Psychiatric: She has a normal mood and affect. Her behavior is normal. Judgment and thought content normal.          Assessment & Plan:   Problem List Items Addressed This Visit      High   Diabetes type 2, controlled    BG have been extremely high after cortisone injection. Will have her increase Lantus to 55 units daily. Follow up in 4 weeks and prn.      Relevant  Medications   insulin glargine (LANTUS) 100 UNIT/ML injection   Other Relevant Orders   Comprehensive metabolic panel   POCT urinalysis dipstick (Completed)   Urine culture     Unprioritized   Acute on chronic renal failure - Primary    Acute on chronic renal failure on recent labs in ED. Will recheck renal function today. Encouraged fluid intake.      Relevant Orders   POCT urinalysis dipstick  (Completed)   Urine culture   Osteoarthrosis, unspecified whether generalized or localized, involving lower leg    Severe bilateral knee OA with chronic pain. Unable to take NSAIDS. No improvement with Tylenol, Tramadol. Currently using Vicodin 4 tabs daily. Controlled substance contract on file. UDS today. Discussed risks of chronic narcotic use.      Relevant Orders   POCT urinalysis dipstick (Completed)   Urine culture    Other Visit Diagnoses    UTI symptoms        Relevant Orders    POCT urinalysis dipstick (Completed)    Urine culture        Return in about 4 weeks (around 03/10/2015).

## 2015-02-11 ENCOUNTER — Encounter: Payer: Self-pay | Admitting: Emergency Medicine

## 2015-02-11 ENCOUNTER — Inpatient Hospital Stay
Admission: EM | Admit: 2015-02-11 | Discharge: 2015-02-13 | DRG: 057 | Disposition: A | Discharge status: Home / Self Care | Payer: Medicare Other | Attending: Internal Medicine | Admitting: Internal Medicine

## 2015-02-11 DIAGNOSIS — G20 Parkinson's disease: Secondary | ICD-10-CM | POA: Diagnosis not present

## 2015-02-11 DIAGNOSIS — M810 Age-related osteoporosis without current pathological fracture: Secondary | ICD-10-CM | POA: Diagnosis present

## 2015-02-11 DIAGNOSIS — E785 Hyperlipidemia, unspecified: Secondary | ICD-10-CM | POA: Diagnosis present

## 2015-02-11 DIAGNOSIS — E1142 Type 2 diabetes mellitus with diabetic polyneuropathy: Secondary | ICD-10-CM | POA: Diagnosis present

## 2015-02-11 DIAGNOSIS — I639 Cerebral infarction, unspecified: Secondary | ICD-10-CM | POA: Diagnosis present

## 2015-02-11 DIAGNOSIS — Z8673 Personal history of transient ischemic attack (TIA), and cerebral infarction without residual deficits: Secondary | ICD-10-CM

## 2015-02-11 DIAGNOSIS — R2681 Unsteadiness on feet: Secondary | ICD-10-CM | POA: Diagnosis present

## 2015-02-11 DIAGNOSIS — I5032 Chronic diastolic (congestive) heart failure: Secondary | ICD-10-CM | POA: Diagnosis present

## 2015-02-11 DIAGNOSIS — Z66 Do not resuscitate: Secondary | ICD-10-CM | POA: Diagnosis present

## 2015-02-11 DIAGNOSIS — M179 Osteoarthritis of knee, unspecified: Secondary | ICD-10-CM | POA: Diagnosis present

## 2015-02-11 DIAGNOSIS — N183 Chronic kidney disease, stage 3 unspecified: Secondary | ICD-10-CM | POA: Diagnosis present

## 2015-02-11 DIAGNOSIS — X58XXXA Exposure to other specified factors, initial encounter: Secondary | ICD-10-CM | POA: Diagnosis present

## 2015-02-11 DIAGNOSIS — K861 Other chronic pancreatitis: Secondary | ICD-10-CM | POA: Diagnosis present

## 2015-02-11 DIAGNOSIS — F329 Major depressive disorder, single episode, unspecified: Secondary | ICD-10-CM | POA: Diagnosis present

## 2015-02-11 DIAGNOSIS — S3092XA Unspecified superficial injury of abdominal wall, initial encounter: Secondary | ICD-10-CM | POA: Diagnosis present

## 2015-02-11 DIAGNOSIS — K219 Gastro-esophageal reflux disease without esophagitis: Secondary | ICD-10-CM | POA: Diagnosis present

## 2015-02-11 DIAGNOSIS — I129 Hypertensive chronic kidney disease with stage 1 through stage 4 chronic kidney disease, or unspecified chronic kidney disease: Secondary | ICD-10-CM | POA: Diagnosis present

## 2015-02-11 DIAGNOSIS — Z794 Long term (current) use of insulin: Secondary | ICD-10-CM

## 2015-02-11 DIAGNOSIS — I10 Essential (primary) hypertension: Secondary | ICD-10-CM | POA: Diagnosis present

## 2015-02-11 DIAGNOSIS — Z888 Allergy status to other drugs, medicaments and biological substances status: Secondary | ICD-10-CM

## 2015-02-11 DIAGNOSIS — F419 Anxiety disorder, unspecified: Secondary | ICD-10-CM | POA: Diagnosis present

## 2015-02-11 DIAGNOSIS — Z7982 Long term (current) use of aspirin: Secondary | ICD-10-CM

## 2015-02-11 DIAGNOSIS — I2510 Atherosclerotic heart disease of native coronary artery without angina pectoris: Secondary | ICD-10-CM | POA: Diagnosis present

## 2015-02-11 DIAGNOSIS — Z7901 Long term (current) use of anticoagulants: Secondary | ICD-10-CM

## 2015-02-11 DIAGNOSIS — E669 Obesity, unspecified: Secondary | ICD-10-CM | POA: Diagnosis present

## 2015-02-11 DIAGNOSIS — Z90410 Acquired total absence of pancreas: Secondary | ICD-10-CM

## 2015-02-11 DIAGNOSIS — E039 Hypothyroidism, unspecified: Secondary | ICD-10-CM | POA: Diagnosis present

## 2015-02-11 DIAGNOSIS — L089 Local infection of the skin and subcutaneous tissue, unspecified: Secondary | ICD-10-CM | POA: Diagnosis present

## 2015-02-11 DIAGNOSIS — G2581 Restless legs syndrome: Secondary | ICD-10-CM | POA: Diagnosis present

## 2015-02-11 DIAGNOSIS — R296 Repeated falls: Secondary | ICD-10-CM | POA: Diagnosis present

## 2015-02-11 DIAGNOSIS — Z79899 Other long term (current) drug therapy: Secondary | ICD-10-CM

## 2015-02-11 DIAGNOSIS — I480 Paroxysmal atrial fibrillation: Secondary | ICD-10-CM | POA: Diagnosis present

## 2015-02-11 DIAGNOSIS — R1310 Dysphagia, unspecified: Secondary | ICD-10-CM | POA: Diagnosis present

## 2015-02-11 DIAGNOSIS — R531 Weakness: Secondary | ICD-10-CM | POA: Diagnosis not present

## 2015-02-11 DIAGNOSIS — E119 Type 2 diabetes mellitus without complications: Secondary | ICD-10-CM

## 2015-02-11 DIAGNOSIS — S31109A Unspecified open wound of abdominal wall, unspecified quadrant without penetration into peritoneal cavity, initial encounter: Secondary | ICD-10-CM | POA: Diagnosis present

## 2015-02-11 HISTORY — DX: Chronic diastolic (congestive) heart failure: I50.32

## 2015-02-11 LAB — COMPREHENSIVE METABOLIC PANEL
ALBUMIN: 3.4 g/dL — AB (ref 3.5–5.2)
ALT: 6 U/L (ref 0–35)
AST: 16 U/L (ref 0–37)
Alkaline Phosphatase: 76 U/L (ref 39–117)
BUN: 40 mg/dL — ABNORMAL HIGH (ref 6–23)
CALCIUM: 8.9 mg/dL (ref 8.4–10.5)
CHLORIDE: 108 meq/L (ref 96–112)
CO2: 30 mEq/L (ref 19–32)
CREATININE: 1.85 mg/dL — AB (ref 0.40–1.20)
GFR: 27.63 mL/min — AB (ref >60.00)
Glucose, Bld: 76 mg/dL (ref 70–99)
POTASSIUM: 4.9 meq/L (ref 3.5–5.1)
Sodium: 144 mEq/L (ref 135–145)
Total Bilirubin: 0.3 mg/dL (ref 0.2–1.2)
Total Protein: 6.2 g/dL (ref 6.0–8.3)

## 2015-02-11 NOTE — ED Notes (Signed)
Patient has history of falls, tonight reports she just lost her balance and fell hitting left elbow. Patient was just assessed here on Sunday for a chronic wound to abdomen and then had follow up with MD office yesterday.

## 2015-02-11 NOTE — ED Notes (Signed)
Patient to ED with c/o recent falls lately. Patient reports tonight she fell landing on left elbow. Patient brought by [facility].

## 2015-02-12 ENCOUNTER — Ambulatory Visit: Admission: RE | Admit: 2015-02-12 | Payer: Medicare Other | Source: Ambulatory Visit

## 2015-02-12 ENCOUNTER — Inpatient Hospital Stay: Payer: Medicare Other

## 2015-02-12 ENCOUNTER — Telehealth: Payer: Self-pay | Admitting: *Deleted

## 2015-02-12 ENCOUNTER — Emergency Department: Payer: Medicare Other

## 2015-02-12 ENCOUNTER — Inpatient Hospital Stay (HOSPITAL_COMMUNITY)
Admit: 2015-02-12 | Discharge: 2015-02-12 | Disposition: A | Discharge status: Home / Self Care | Payer: Medicare Other | Attending: Internal Medicine | Admitting: Internal Medicine

## 2015-02-12 ENCOUNTER — Encounter: Payer: Self-pay | Admitting: Internal Medicine

## 2015-02-12 DIAGNOSIS — K219 Gastro-esophageal reflux disease without esophagitis: Secondary | ICD-10-CM | POA: Diagnosis present

## 2015-02-12 DIAGNOSIS — Z8673 Personal history of transient ischemic attack (TIA), and cerebral infarction without residual deficits: Secondary | ICD-10-CM | POA: Diagnosis not present

## 2015-02-12 DIAGNOSIS — N183 Chronic kidney disease, stage 3 unspecified: Secondary | ICD-10-CM | POA: Diagnosis present

## 2015-02-12 DIAGNOSIS — I5032 Chronic diastolic (congestive) heart failure: Secondary | ICD-10-CM | POA: Diagnosis present

## 2015-02-12 DIAGNOSIS — I639 Cerebral infarction, unspecified: Secondary | ICD-10-CM | POA: Diagnosis not present

## 2015-02-12 DIAGNOSIS — Z7982 Long term (current) use of aspirin: Secondary | ICD-10-CM | POA: Diagnosis not present

## 2015-02-12 DIAGNOSIS — G20 Parkinson's disease: Secondary | ICD-10-CM | POA: Diagnosis present

## 2015-02-12 DIAGNOSIS — S3092XA Unspecified superficial injury of abdominal wall, initial encounter: Secondary | ICD-10-CM | POA: Diagnosis present

## 2015-02-12 DIAGNOSIS — Z794 Long term (current) use of insulin: Secondary | ICD-10-CM | POA: Diagnosis not present

## 2015-02-12 DIAGNOSIS — E1142 Type 2 diabetes mellitus with diabetic polyneuropathy: Secondary | ICD-10-CM | POA: Diagnosis present

## 2015-02-12 DIAGNOSIS — L089 Local infection of the skin and subcutaneous tissue, unspecified: Secondary | ICD-10-CM | POA: Diagnosis present

## 2015-02-12 DIAGNOSIS — R2681 Unsteadiness on feet: Secondary | ICD-10-CM | POA: Diagnosis present

## 2015-02-12 DIAGNOSIS — R1310 Dysphagia, unspecified: Secondary | ICD-10-CM | POA: Diagnosis present

## 2015-02-12 DIAGNOSIS — X58XXXA Exposure to other specified factors, initial encounter: Secondary | ICD-10-CM | POA: Diagnosis present

## 2015-02-12 DIAGNOSIS — F329 Major depressive disorder, single episode, unspecified: Secondary | ICD-10-CM | POA: Diagnosis present

## 2015-02-12 DIAGNOSIS — F419 Anxiety disorder, unspecified: Secondary | ICD-10-CM | POA: Diagnosis present

## 2015-02-12 DIAGNOSIS — M810 Age-related osteoporosis without current pathological fracture: Secondary | ICD-10-CM | POA: Diagnosis present

## 2015-02-12 DIAGNOSIS — I2510 Atherosclerotic heart disease of native coronary artery without angina pectoris: Secondary | ICD-10-CM | POA: Diagnosis present

## 2015-02-12 DIAGNOSIS — R296 Repeated falls: Secondary | ICD-10-CM | POA: Diagnosis present

## 2015-02-12 DIAGNOSIS — Z888 Allergy status to other drugs, medicaments and biological substances status: Secondary | ICD-10-CM | POA: Diagnosis not present

## 2015-02-12 DIAGNOSIS — Z7901 Long term (current) use of anticoagulants: Secondary | ICD-10-CM | POA: Diagnosis not present

## 2015-02-12 DIAGNOSIS — I638 Other cerebral infarction: Secondary | ICD-10-CM | POA: Diagnosis not present

## 2015-02-12 DIAGNOSIS — I480 Paroxysmal atrial fibrillation: Secondary | ICD-10-CM | POA: Diagnosis present

## 2015-02-12 DIAGNOSIS — R531 Weakness: Secondary | ICD-10-CM | POA: Diagnosis present

## 2015-02-12 DIAGNOSIS — Z90410 Acquired total absence of pancreas: Secondary | ICD-10-CM

## 2015-02-12 DIAGNOSIS — I129 Hypertensive chronic kidney disease with stage 1 through stage 4 chronic kidney disease, or unspecified chronic kidney disease: Secondary | ICD-10-CM | POA: Diagnosis present

## 2015-02-12 DIAGNOSIS — M179 Osteoarthritis of knee, unspecified: Secondary | ICD-10-CM | POA: Diagnosis present

## 2015-02-12 DIAGNOSIS — E785 Hyperlipidemia, unspecified: Secondary | ICD-10-CM | POA: Diagnosis present

## 2015-02-12 DIAGNOSIS — Z66 Do not resuscitate: Secondary | ICD-10-CM | POA: Diagnosis present

## 2015-02-12 DIAGNOSIS — I10 Essential (primary) hypertension: Secondary | ICD-10-CM | POA: Diagnosis not present

## 2015-02-12 DIAGNOSIS — K861 Other chronic pancreatitis: Secondary | ICD-10-CM | POA: Diagnosis present

## 2015-02-12 DIAGNOSIS — Z79899 Other long term (current) drug therapy: Secondary | ICD-10-CM | POA: Diagnosis not present

## 2015-02-12 DIAGNOSIS — G2581 Restless legs syndrome: Secondary | ICD-10-CM | POA: Diagnosis present

## 2015-02-12 DIAGNOSIS — E039 Hypothyroidism, unspecified: Secondary | ICD-10-CM | POA: Diagnosis present

## 2015-02-12 DIAGNOSIS — E669 Obesity, unspecified: Secondary | ICD-10-CM | POA: Diagnosis present

## 2015-02-12 LAB — CBC
HCT: 32.1 % — ABNORMAL LOW (ref 35.0–47.0)
HCT: 34 % — ABNORMAL LOW (ref 35.0–47.0)
HEMOGLOBIN: 10.3 g/dL — AB (ref 12.0–16.0)
HEMOGLOBIN: 11 g/dL — AB (ref 12.0–16.0)
MCH: 30.7 pg (ref 26.0–34.0)
MCH: 30.7 pg (ref 26.0–34.0)
MCHC: 32.1 g/dL (ref 32.0–36.0)
MCHC: 32.2 g/dL (ref 32.0–36.0)
MCV: 95.4 fL (ref 80.0–100.0)
MCV: 95.5 fL (ref 80.0–100.0)
PLATELETS: 118 10*3/uL — AB (ref 150–440)
PLATELETS: 92 10*3/uL — AB (ref 150–440)
RBC: 3.37 MIL/uL — AB (ref 3.80–5.20)
RBC: 3.57 MIL/uL — ABNORMAL LOW (ref 3.80–5.20)
RDW: 14.2 % (ref 11.5–14.5)
RDW: 14.4 % (ref 11.5–14.5)
WBC: 5.5 10*3/uL (ref 3.6–11.0)
WBC: 9 10*3/uL (ref 3.6–11.0)

## 2015-02-12 LAB — BASIC METABOLIC PANEL
Anion gap: 8 (ref 5–15)
BUN: 28 mg/dL — ABNORMAL HIGH (ref 6–20)
CO2: 27 mmol/L (ref 22–32)
Calcium: 8.2 mg/dL — ABNORMAL LOW (ref 8.9–10.3)
Chloride: 109 mmol/L (ref 101–111)
Creatinine, Ser: 1.29 mg/dL — ABNORMAL HIGH (ref 0.44–1.00)
GFR, EST AFRICAN AMERICAN: 43 mL/min — AB (ref >60)
GFR, EST NON AFRICAN AMERICAN: 37 mL/min — AB (ref >60)
Glucose, Bld: 231 mg/dL — ABNORMAL HIGH (ref 65–99)
POTASSIUM: 5.1 mmol/L (ref 3.5–5.1)
Sodium: 144 mmol/L (ref 135–145)

## 2015-02-12 LAB — URINALYSIS COMPLETE WITH MICROSCOPIC (ARMC ONLY)
Bilirubin Urine: NEGATIVE
Glucose, UA: NEGATIVE mg/dL
Hgb urine dipstick: NEGATIVE
LEUKOCYTES UA: NEGATIVE
NITRITE: NEGATIVE
PH: 5 (ref 5.0–8.0)
Protein, ur: NEGATIVE mg/dL
RBC / HPF: NONE SEEN RBC/hpf (ref 0–5)
Specific Gravity, Urine: 1.02 (ref 1.005–1.030)
WBC, UA: NONE SEEN WBC/hpf (ref 0–5)

## 2015-02-12 LAB — GLUCOSE, CAPILLARY
GLUCOSE-CAPILLARY: 244 mg/dL — AB (ref 65–99)
GLUCOSE-CAPILLARY: 189 mg/dL — AB (ref 65–99)
Glucose-Capillary: 321 mg/dL — ABNORMAL HIGH (ref 65–99)
Glucose-Capillary: 244 mg/dL — ABNORMAL HIGH (ref 65–99)

## 2015-02-12 LAB — COMPREHENSIVE METABOLIC PANEL
ALT: 7 U/L — ABNORMAL LOW (ref 14–54)
AST: 25 U/L (ref 15–41)
Albumin: 3 g/dL — ABNORMAL LOW (ref 3.5–5.0)
Alkaline Phosphatase: 72 U/L (ref 38–126)
Anion gap: 6 (ref 5–15)
BUN: 30 mg/dL — AB (ref 6–20)
CALCIUM: 8.6 mg/dL — AB (ref 8.9–10.3)
CO2: 29 mmol/L (ref 22–32)
CREATININE: 1.45 mg/dL — AB (ref 0.44–1.00)
Chloride: 107 mmol/L (ref 101–111)
GFR calc Af Amer: 37 mL/min — ABNORMAL LOW (ref >60)
GFR calc non Af Amer: 32 mL/min — ABNORMAL LOW (ref >60)
GLUCOSE: 161 mg/dL — AB (ref 65–99)
Potassium: 4.6 mmol/L (ref 3.5–5.1)
SODIUM: 142 mmol/L (ref 135–145)
TOTAL PROTEIN: 5.9 g/dL — AB (ref 6.5–8.1)
Total Bilirubin: 0.2 mg/dL — ABNORMAL LOW (ref 0.3–1.2)

## 2015-02-12 LAB — VITAMIN B12: Vitamin B-12: 222 pg/mL (ref 180–914)

## 2015-02-12 LAB — URINE CULTURE

## 2015-02-12 LAB — LIPID PANEL
Cholesterol: 160 mg/dL (ref 0–200)
HDL: 44 mg/dL (ref >40)
LDL CALC: 78 mg/dL (ref 0–99)
Total CHOL/HDL Ratio: 3.6 RATIO
Triglycerides: 189 mg/dL — ABNORMAL HIGH (ref <150)
VLDL: 38 mg/dL (ref 0–40)

## 2015-02-12 LAB — TROPONIN I
Troponin I: <0.03 ng/mL (ref <0.031)
Troponin I: <0.03 ng/mL (ref <0.031)

## 2015-02-12 LAB — MAGNESIUM: MAGNESIUM: 2 mg/dL (ref 1.7–2.4)

## 2015-02-12 LAB — C DIFFICILE QUICK SCREEN W PCR REFLEX
C Diff antigen: NEGATIVE
C Diff interpretation: NEGATIVE
C Diff toxin: NEGATIVE

## 2015-02-12 LAB — FERRITIN: Ferritin: 20 ng/mL (ref 11–307)

## 2015-02-12 LAB — TSH: TSH: 0.806 u[IU]/mL (ref 0.350–4.500)

## 2015-02-12 LAB — PHOSPHORUS: PHOSPHORUS: 3.3 mg/dL (ref 2.5–4.6)

## 2015-02-12 LAB — FOLATE: FOLATE: 46 ng/mL (ref >5.9)

## 2015-02-12 MED ORDER — ACETAMINOPHEN 325 MG PO TABS: 650.0000 mg | ORAL_TABLET | Freq: Four times a day (QID) | ORAL | Status: DC | PRN

## 2015-02-12 MED ORDER — INSULIN GLARGINE 100 UNIT/ML ~~LOC~~ SOLN
30.0000 [IU] | Freq: Every day | SUBCUTANEOUS | Status: DC
  Administered 2015-02-12: 30 [IU] via SUBCUTANEOUS
  Filled 2015-02-12 (×2): qty 0.3

## 2015-02-12 MED ORDER — APIXABAN 2.5 MG PO TABS
2.5000 mg | ORAL_TABLET | Freq: Two times a day (BID) | ORAL | Status: DC
  Administered 2015-02-12: 2.5 mg via ORAL
  Filled 2015-02-12: qty 1

## 2015-02-12 MED ORDER — PRAVASTATIN SODIUM 20 MG PO TABS
40.0000 mg | ORAL_TABLET | Freq: Every day | ORAL | Status: DC
  Administered 2015-02-12 – 2015-02-13 (×2): 40 mg via ORAL
  Filled 2015-02-12 (×2): qty 2

## 2015-02-12 MED ORDER — LEVOTHYROXINE SODIUM 75 MCG PO TABS
75.0000 ug | ORAL_TABLET | Freq: Every day | ORAL | Status: DC
Start: 2015-02-12 — End: 2015-02-13
  Administered 2015-02-13: 75 ug via ORAL
  Filled 2015-02-12: qty 1

## 2015-02-12 MED ORDER — PANCRELIPASE (LIP-PROT-AMYL) 12000-38000 UNITS PO CPEP
12000.0000 [IU] | ORAL_CAPSULE | Freq: Three times a day (TID) | ORAL | Status: DC
  Administered 2015-02-12 – 2015-02-13 (×5): 12000 [IU] via ORAL
  Filled 2015-02-12 (×5): qty 1

## 2015-02-12 MED ORDER — HYDROCODONE-ACETAMINOPHEN 5-325 MG PO TABS
1.0000 | ORAL_TABLET | Freq: Three times a day (TID) | ORAL | Status: DC | PRN
  Administered 2015-02-12 – 2015-02-13 (×2): 1 via ORAL
  Filled 2015-02-12 (×2): qty 1

## 2015-02-12 MED ORDER — PANTOPRAZOLE SODIUM 40 MG PO TBEC
40.0000 mg | DELAYED_RELEASE_TABLET | Freq: Every day | ORAL | Status: DC
  Administered 2015-02-12 – 2015-02-13 (×2): 40 mg via ORAL
  Filled 2015-02-12 (×2): qty 1

## 2015-02-12 MED ORDER — STROKE: EARLY STAGES OF RECOVERY BOOK: Freq: Once | Status: DC

## 2015-02-12 MED ORDER — ACETAMINOPHEN 650 MG RE SUPP: 650.0000 mg | Freq: Four times a day (QID) | RECTAL | Status: DC | PRN

## 2015-02-12 MED ORDER — LISINOPRIL 20 MG PO TABS
20.0000 mg | ORAL_TABLET | Freq: Two times a day (BID) | ORAL | Status: DC
  Administered 2015-02-12 – 2015-02-13 (×3): 20 mg via ORAL
  Filled 2015-02-12 (×3): qty 1

## 2015-02-12 MED ORDER — SODIUM CHLORIDE 0.9 % IJ SOLN
3.0000 mL | Freq: Two times a day (BID) | INTRAMUSCULAR | Status: DC
  Administered 2015-02-12 – 2015-02-13 (×3): 3 mL via INTRAVENOUS

## 2015-02-12 MED ORDER — BUSPIRONE HCL 10 MG PO TABS
10.0000 mg | ORAL_TABLET | Freq: Two times a day (BID) | ORAL | Status: DC
  Administered 2015-02-12 – 2015-02-13 (×3): 10 mg via ORAL
  Filled 2015-02-12 (×3): qty 1

## 2015-02-12 MED ORDER — FLUTICASONE PROPIONATE 50 MCG/ACT NA SUSP
2.0000 | Freq: Every day | NASAL | Status: DC
  Administered 2015-02-12 – 2015-02-13 (×2): 2 via NASAL
  Filled 2015-02-12: qty 16

## 2015-02-12 MED ORDER — CARBIDOPA-LEVODOPA ER 25-100 MG PO TBCR
1.0000 | EXTENDED_RELEASE_TABLET | Freq: Two times a day (BID) | ORAL | Status: DC
  Administered 2015-02-12: 1 via ORAL
  Filled 2015-02-12 (×2): qty 1

## 2015-02-12 MED ORDER — SODIUM CHLORIDE 0.9 % IV BOLUS (SEPSIS)
1000.0000 mL | Freq: Once | INTRAVENOUS | Status: AC
  Administered 2015-02-12: 1000 mL via INTRAVENOUS

## 2015-02-12 MED ORDER — LOPERAMIDE HCL 2 MG PO CAPS
2.0000 mg | ORAL_CAPSULE | Freq: Four times a day (QID) | ORAL | Status: DC | PRN
Start: 2015-02-12 — End: 2015-02-13
  Administered 2015-02-13: 2 mg via ORAL
  Filled 2015-02-12: qty 1

## 2015-02-12 MED ORDER — GABAPENTIN 100 MG PO CAPS
100.0000 mg | ORAL_CAPSULE | Freq: Three times a day (TID) | ORAL | Status: DC
  Administered 2015-02-12 – 2015-02-13 (×3): 100 mg via ORAL
  Filled 2015-02-12 (×3): qty 1

## 2015-02-12 MED ORDER — ALPRAZOLAM 0.25 MG PO TABS
0.2500 mg | ORAL_TABLET | Freq: Three times a day (TID) | ORAL | Status: DC | PRN
  Administered 2015-02-13: 0.25 mg via ORAL
  Filled 2015-02-12: qty 1

## 2015-02-12 MED ORDER — CARBIDOPA-LEVODOPA ER 25-100 MG PO TBCR
2.0000 | EXTENDED_RELEASE_TABLET | Freq: Two times a day (BID) | ORAL | Status: DC
  Administered 2015-02-12 – 2015-02-13 (×2): 2 via ORAL
  Filled 2015-02-12 (×3): qty 2

## 2015-02-12 MED ORDER — ROPINIROLE HCL 1 MG PO TABS
0.5000 mg | ORAL_TABLET | Freq: Every day | ORAL | Status: DC
  Administered 2015-02-12: 0.5 mg via ORAL
  Filled 2015-02-12: qty 1

## 2015-02-12 MED ORDER — ASPIRIN 81 MG PO CHEW
81.0000 mg | CHEWABLE_TABLET | Freq: Every day | ORAL | Status: DC
  Administered 2015-02-12 – 2015-02-13 (×2): 81 mg via ORAL
  Filled 2015-02-12 (×2): qty 1

## 2015-02-12 MED ORDER — INSULIN ASPART 100 UNIT/ML ~~LOC~~ SOLN
0.0000 [IU] | Freq: Three times a day (TID) | SUBCUTANEOUS | Status: DC
  Administered 2015-02-12: 3 [IU] via SUBCUTANEOUS
  Administered 2015-02-12: 7 [IU] via SUBCUTANEOUS
  Administered 2015-02-12: 2 [IU] via SUBCUTANEOUS
  Administered 2015-02-12 – 2015-02-13 (×3): 3 [IU] via SUBCUTANEOUS
  Filled 2015-02-12: qty 2
  Filled 2015-02-12 (×2): qty 3
  Filled 2015-02-12: qty 7
  Filled 2015-02-12 (×2): qty 3

## 2015-02-12 MED ORDER — METOPROLOL SUCCINATE ER 100 MG PO TB24
100.0000 mg | ORAL_TABLET | Freq: Every day | ORAL | Status: DC
  Administered 2015-02-12 – 2015-02-13 (×2): 100 mg via ORAL
  Filled 2015-02-12 (×2): qty 1

## 2015-02-12 NOTE — Telephone Encounter (Signed)
They should ask the hospitalist taking care of the patient to place any orders.

## 2015-02-12 NOTE — Plan of Care (Signed)
Problem: SLP Dysphagia Goals Goal: Misc Dysphagia Goal Pt will safely tolerate po diet of least restrictive consistency w/ no overt s/s of aspiration noted by Staff/pt/family x3 sessions.    

## 2015-02-12 NOTE — Evaluation (Signed)
Clinical/Bedside Swallow Evaluation Patient Details  Name: [name] MRN: [id number] Date of Birth: Apr 13, 1929  Today's Date: 02/12/2015 Time: SLP Start Time (ACUTE ONLY): 0800 SLP Stop Time (ACUTE ONLY): 0900 SLP Time Calculation (min) (ACUTE ONLY): 60 min  Past Medical History:  Past Medical History  Diagnosis Date  . Anxiety   . Chronic diastolic CHF (congestive heart failure)   . Depression   . Diabetes mellitus     Followed by Dr. Tedd Sias at [facility]  . GERD (gastroesophageal reflux disease)   . Hypertension   . Hypothyroidism   . Osteoporosis   . Allergy   . Hyperlipidemia   . RLS (restless legs syndrome)   . Pancreatic disease     pancreatic failure  . Urinary incontinence   . Pancreatitis     Pancreatic resection/ open wound- [facility] 11/04  . Pneumonia     Pneumonia/ Emphysema 05/05  . 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 TIA (transient ischemic attack)     recurrent 12/15  . History of pleural empyema   . Sepsis   . Frequent falls   . Arthritis     left knee, [facility]  . CAD (coronary artery disease), native coronary artery      1//15  T mid-LAD  stent OM2  . Parkinson's disease    Past Surgical History:  Past Surgical History  Procedure Laterality Date  . Breast lumpectomy      Lumpectomy right breast  . 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 Abdominal hysterectomy      Hysteroscopy/ D & C ([facility]) 12/05  . Removal of pancreas  2004  . Kyphoplasty  2005  . Broken shoulder and orbital bone  2005  . Cholecystectomy  1973  . Thoracentesis    . Vaginal delivery      7  . Cardiac catheterization  08/29/2013    x1 stent @ [facility]  . Cataract surgery     HPI:  pt admitted for concern of stroke w/ c/o left facial numbness(min.) and R>L sided weakness and "difficulty w/ her speech". Upon review of chart notes, CT was negative for any acute deficits; noted general  atrophy w/ ischemic dis., Remote deficits of R frontoparietal anc cerebellar region changes. Pt has a longstanding h/o Esophageal dysmotility w/ Barium studies and dx of gastric varicies. Pt w/ dx of Pancreatic dis. and necrotizing status w/ open abdominal wall wound followed by the wound clinic for several years. Pt is alert and oriented and following all instructions. Pt did not c/o any difficutly w/ her speech/language; she conversed at conversational level w/ SLP and Dtr on the phone. Pt does have the dx of Parkinson's Dis. and is on Sinemet.   Assessment / Plan / Recommendation Clinical Impression  Pt appears at her baseline w/ regard to her swallow function. She appears at reduced risk for aspiration w/ a mech soft diet w/ thin liquids following general aspiration precautions; reflux precautions rec'd as well sec. to her baseline GERD. Rec. taking medication for Parkinson's Dis. ~45 mins. prior to meals to aid swallowing as best able. Rec. a mech soft diet w/ aspiration precautions; meds in Puree. Tray setup A at meals sec. to overall weakness.   Of Note, pt is having a (scheduled) Barium swallow study this morning as was planned as an Outpt. sec. to Esophageal dysmotility complaints baseline.    Aspiration  Risk   (reduced)    Diet Recommendation Dysphagia 3 (Mech soft);Thin   Medication Administration: Whole meds with puree Compensations: Slow rate;Small sips/bites (no straws if increased coughing noted when using.)    Other  Recommendations Recommended Consults:  (Dietician) Oral Care Recommendations: Oral care BID;Oral care before and after PO;Patient independent with oral care Other Recommendations:  (rec. taking med. for Parkinson's dis. ~45 mins. prior meals)   Follow Up Recommendations       Frequency and Duration min 3x week  1 week   Pertinent Vitals/Pain denied    SLP Swallow Goals  see care plan   Swallow Study Prior Functional Status   lived at home prior; baseline  Esophageal dysmotility    General Date of Onset: 02/11/15 Other Pertinent Information: pt admitted for concern of stroke w/ c/o left facial numbness(min.) and R>L sided weakness and "difficulty w/ her speech". Upon review of chart notes, CT was negative for any acute deficits; noted general atrophy w/ ischemic dis., Remote deficits of R frontoparietal anc cerebellar region changes. Pt has a longstanding h/o Esophageal dysmotility w/ Barium studies and dx of gastric varicies. Pt w/ dx of Pancreatic dis. and necrotizing status w/ open abdominal wall wound followed by the wound clinic for several years. Pt is alert and oriented and following all instructions. Pt did not c/o any difficutly w/ her speech/language; she conversed at conversational level w/ SLP and Dtr on the phone. Pt does have the dx of Parkinson's Dis. and is on Sinemet. Type of Study: Bedside swallow evaluation Previous Swallow Assessment: barium swallow studies in the past Diet Prior to this Study: Regular;Thin liquids (at home) Temperature Spikes Noted: No Respiratory Status: Room air (WBC not elevated (5.5)) History of Recent Intubation: No Behavior/Cognition: Alert;Cooperative;Pleasant mood Oral Cavity - Dentition: Adequate natural dentition/normal for age Self-Feeding Abilities: Able to feed self;Needs set up Patient Positioning: Upright in bed Baseline Vocal Quality: Normal Volitional Cough: Strong Volitional Swallow: Able to elicit    Oral/Motor/Sensory Function Overall Oral Motor/Sensory Function: Appears within functional limits for tasks assessed Labial ROM: Within Functional Limits Labial Symmetry: Within Functional Limits Labial Strength: Within Functional Limits Labial Sensation: Within Functional Limits Lingual ROM: Within Functional Limits Lingual Symmetry: Within Functional Limits Lingual Strength: Within Functional Limits Facial ROM: Within Functional Limits Facial Symmetry: Within Functional Limits Velum:  Within Functional Limits Mandible: Within Functional Limits   Ice Chips Ice chips: Within functional limits Presentation: Spoon (x3 trials)   Thin Liquid Thin Liquid: Within functional limits Presentation: Cup;Straw (x4 trials each) Other Comments: on a final trial when pt was taking a drink via straw and talking w/ SLP, pt exhibited a mild cough immediately when talking. Educated pt on not talking when drinking and drinking slowly using small, single sips; possibly not using large straws or straws in general if more comfortable drinking from the cup.     Nectar Thick Nectar Thick Liquid: Not tested   Honey Thick Honey Thick Liquid: Not tested   Puree Puree: Within functional limits Presentation: Spoon;Self Fed (x6 trials)   Solid   GO    Solid: Within functional limits Presentation: Self Fed (x3 trials)       Andretta Ergle 02/12/2015,11:19 AM

## 2015-02-12 NOTE — Telephone Encounter (Signed)
Spoke with Judeth Cornfield at Radiology.  She verbalized understanding

## 2015-02-12 NOTE — Consult Note (Signed)
Reason for Consult: falls Referring Physician: Dr. Concepcion Living is an 81 y.o. female.  HPI: seen at request of Dr. Manuella Ghazi secondary to falls.  Pt notes that she feels weaker on the L today but that she does have some baseline weakness on the left as well from previous stroke.  Pt states that she has ambulation problems for the past 12 years when she got diagnosed with Parkinsons and that she is still on medications for this even though she has not seen a Neurologist in several years.  She also complains of restless leg syndrome.  She states that she has R knee pain and that she normally ambulates with a walker but over the past few days she has not been able to.  Past Medical History  Diagnosis Date  . Anxiety   . Chronic diastolic CHF (congestive heart failure)   . Depression   . Diabetes mellitus     Followed by Dr. Gabriel Carina at [facility]  . GERD (gastroesophageal reflux disease)   . Hypertension   . Hypothyroidism   . Osteoporosis   . Allergy   . Hyperlipidemia   . RLS (restless legs syndrome)   . Pancreatic disease     pancreatic failure  . Urinary incontinence   . Pancreatitis     Pancreatic resection/ open wound- [facility] 11/04  . Pneumonia     Pneumonia/ Emphysema 05/05  . 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 TIA (transient ischemic attack)     recurrent 12/15  . History of pleural empyema   . Sepsis   . Frequent falls   . Arthritis     left knee, [facility]  . CAD (coronary artery disease), native coronary artery      1//15  T mid-LAD  stent OM2  . Parkinson's disease     Past Surgical History  Procedure Laterality Date  . Breast lumpectomy      Lumpectomy right breast  . 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 Abdominal hysterectomy      Hysteroscopy/ D & C ([place]) 12/05  . Removal of pancreas  2004  . Kyphoplasty  2005  . Broken shoulder and orbital bone  2005  .  Cholecystectomy  1973  . Thoracentesis    . Vaginal delivery      7  . Cardiac catheterization  08/29/2013    x1 stent @ [facility]  . Cataract surgery      Family History  Problem Relation Age of Onset  . Heart disease Mother   . Heart attack Mother   . Hypertension Mother   . Heart attack Father     Social History:  reports that she has never smoked. She has never used smokeless tobacco. She reports that she does not drink alcohol or use illicit drugs.  Allergies:  Allergies  Allergen Reactions  . Citalopram Hydrobromide     REACTION: nausea, dizzy, dry mouth  . Diphenhydramine     Other reaction(s): Unknown  . Diphenhydramine Hcl     REACTION: unspecified  . Metoclopramide     Other reaction(s): Unknown  . Metoclopramide Hcl     REACTION: unspecified  . Paroxetine     REACTION: hallucinations  . Tape Other (See Comments) and Rash    Medications: personally reviewed by me as per chart  Results for orders placed or performed during the hospital encounter of  02/11/15 (from the past 48 hour(s))  CBC     Status: Abnormal   Collection Time: 02/12/15 12:44 AM  Result Value Ref Range   WBC 9.0 3.6 - 11.0 K/uL   RBC 3.57 (L) 3.80 - 5.20 MIL/uL   Hemoglobin 11.0 (L) 12.0 - 16.0 g/dL   HCT 34.0 (L) 35.0 - 47.0 %   MCV 95.4 80.0 - 100.0 fL   MCH 30.7 26.0 - 34.0 pg   MCHC 32.2 32.0 - 36.0 g/dL   RDW 14.4 11.5 - 14.5 %   Platelets 118 (L) 150 - 440 K/uL  Comprehensive metabolic panel     Status: Abnormal   Collection Time: 02/12/15 12:44 AM  Result Value Ref Range   Sodium 142 135 - 145 mmol/L   Potassium 4.6 3.5 - 5.1 mmol/L   Chloride 107 101 - 111 mmol/L   CO2 29 22 - 32 mmol/L   Glucose, Bld 161 (H) 65 - 99 mg/dL   BUN 30 (H) 6 - 20 mg/dL   Creatinine, Ser 1.45 (H) 0.44 - 1.00 mg/dL   Calcium 8.6 (L) 8.9 - 10.3 mg/dL   Total Protein 5.9 (L) 6.5 - 8.1 g/dL   Albumin 3.0 (L) 3.5 - 5.0 g/dL   AST 25 15 - 41 U/L   ALT 7 (L) 14 - 54 U/L   Alkaline Phosphatase 72 38 -  126 U/L   Total Bilirubin 0.2 (L) 0.3 - 1.2 mg/dL   GFR calc non Af Amer 32 (L) >60 mL/min   GFR calc Af Amer 37 (L) >60 mL/min    Comment: (NOTE) The eGFR has been calculated using the CKD EPI equation. This calculation has not been validated in all clinical situations. eGFR's persistently <60 mL/min signify possible Chronic Kidney Disease.    Anion gap 6 5 - 15  Basic metabolic panel     Status: Abnormal   Collection Time: 02/12/15  6:08 AM  Result Value Ref Range   Sodium 144 135 - 145 mmol/L   Potassium 5.1 3.5 - 5.1 mmol/L   Chloride 109 101 - 111 mmol/L   CO2 27 22 - 32 mmol/L   Glucose, Bld 231 (H) 65 - 99 mg/dL   BUN 28 (H) 6 - 20 mg/dL   Creatinine, Ser 1.29 (H) 0.44 - 1.00 mg/dL   Calcium 8.2 (L) 8.9 - 10.3 mg/dL   GFR calc non Af Amer 37 (L) >60 mL/min   GFR calc Af Amer 43 (L) >60 mL/min    Comment: (NOTE) The eGFR has been calculated using the CKD EPI equation. This calculation has not been validated in all clinical situations. eGFR's persistently <60 mL/min signify possible Chronic Kidney Disease.    Anion gap 8 5 - 15  CBC     Status: Abnormal   Collection Time: 02/12/15  6:08 AM  Result Value Ref Range   WBC 5.5 3.6 - 11.0 K/uL   RBC 3.37 (L) 3.80 - 5.20 MIL/uL   Hemoglobin 10.3 (L) 12.0 - 16.0 g/dL   HCT 32.1 (L) 35.0 - 47.0 %   MCV 95.5 80.0 - 100.0 fL   MCH 30.7 26.0 - 34.0 pg   MCHC 32.1 32.0 - 36.0 g/dL   RDW 14.2 11.5 - 14.5 %   Platelets 92 (L) 150 - 440 K/uL  Troponin I     Status: None   Collection Time: 02/12/15  6:08 AM  Result Value Ref Range   Troponin I <0.03 <0.031 ng/mL  Comment:        NO INDICATION OF MYOCARDIAL INJURY.   Magnesium     Status: None   Collection Time: 02/12/15  6:08 AM  Result Value Ref Range   Magnesium 2.0 1.7 - 2.4 mg/dL  Phosphorus     Status: None   Collection Time: 02/12/15  6:08 AM  Result Value Ref Range   Phosphorus 3.3 2.5 - 4.6 mg/dL  TSH     Status: None   Collection Time: 02/12/15  6:08 AM   Result Value Ref Range   TSH 0.806 0.350 - 4.500 uIU/mL  Lipid panel     Status: Abnormal   Collection Time: 02/12/15  6:08 AM  Result Value Ref Range   Cholesterol 160 0 - 200 mg/dL   Triglycerides 189 (H) <150 mg/dL   HDL 44 >40 mg/dL   Total CHOL/HDL Ratio 3.6 RATIO   VLDL 38 0 - 40 mg/dL   LDL Cholesterol 78 0 - 99 mg/dL    Comment:        Total Cholesterol/HDL:CHD Risk Coronary Heart Disease Risk Table                     Men   Women  1/2 Average Risk   3.4   3.3  Average Risk       5.0   4.4  2 X Average Risk   9.6   7.1  3 X Average Risk  23.4   11.0        Use the calculated Patient Ratio above and the CHD Risk Table to determine the patient's CHD Risk.        ATP III CLASSIFICATION (LDL):  <100     mg/dL   Optimal  100-129  mg/dL   Near or Above                    Optimal  130-159  mg/dL   Borderline  160-189  mg/dL   High  >190     mg/dL   Very High   Glucose, capillary     Status: Abnormal   Collection Time: 02/12/15  7:35 AM  Result Value Ref Range   Glucose-Capillary 244 (H) 65 - 99 mg/dL   Comment 1 Notify RN   Urinalysis complete, with microscopic ([facility] only)     Status: Abnormal   Collection Time: 02/12/15  8:30 AM  Result Value Ref Range   Color, Urine YELLOW (A) YELLOW   APPearance HAZY (A) CLEAR   Glucose, UA NEGATIVE NEGATIVE mg/dL   Bilirubin Urine NEGATIVE NEGATIVE   Ketones, ur TRACE (A) NEGATIVE mg/dL   Specific Gravity, Urine 1.020 1.005 - 1.030   Hgb urine dipstick NEGATIVE NEGATIVE   pH 5.0 5.0 - 8.0   Protein, ur NEGATIVE NEGATIVE mg/dL   Nitrite NEGATIVE NEGATIVE   Leukocytes, UA NEGATIVE NEGATIVE   RBC / HPF NONE SEEN 0 - 5 RBC/hpf   WBC, UA NONE SEEN 0 - 5 WBC/hpf   Bacteria, UA RARE (A) NONE SEEN   Squamous Epithelial / LPF 0-5 (A) NONE SEEN   Mucous PRESENT   Glucose, capillary     Status: Abnormal   Collection Time: 02/12/15 11:46 AM  Result Value Ref Range   Glucose-Capillary 189 (H) 65 - 99 mg/dL   Comment 1 Notify RN      Ct Head Wo Contrast  02/12/2015   CLINICAL DATA:  Initial evaluation for acute right face/ upper extremity weakness  EXAM: CT HEAD WITHOUT CONTRAST  TECHNIQUE: Contiguous axial images were obtained from the base of the skull through the vertex without intravenous contrast.  COMPARISON:  Prior study from 07/30/2014  FINDINGS: Diffuse prominence of the CSF containing spaces is compatible with generalized cerebral atrophy. Chronic small vessel ischemic changes present within the periventricular and deep white matter both cerebral hemispheres. Scattered small remote infarcts present within the right frontoparietal region. Small remote right cerebellar infarcts also noted. Scattered vascular calcifications present within the carotid siphons and distal left vertebral artery.  No acute large vessel territory infarct identified. Gray-white matter differentiation is maintained. No acute intracranial hemorrhage.  No mass lesion, mass effect, or midline shift. No hydrocephalus. No extra-axial fluid collection.  Scalp soft tissues within normal limits. No acute abnormality about the orbits.  Calvarium intact.  Mild layering opacity present within the lateral recess of the left sphenoid sinus. Paranasal sinuses are otherwise largely clear. No mastoid effusion.  IMPRESSION: 1. No acute intracranial process identified. 2. Generalized cerebral atrophy with chronic microvascular ischemic disease and remote infarcts involving the right frontoparietal region and right cerebellar hemisphere.   Electronically Signed   By: Jeannine Boga M.D.   On: 02/12/2015 03:32    Review of Systems  Constitutional: Positive for malaise/fatigue. Negative for fever, chills and weight loss.  HENT: Negative.   Eyes: Negative.   Respiratory: Negative.   Cardiovascular: Negative.   Gastrointestinal: Negative.   Musculoskeletal: Positive for back pain and falls. Negative for myalgias and neck pain.  Neurological: Positive for  tremors, sensory change, focal weakness and weakness. Negative for speech change and seizures.  Psychiatric/Behavioral: Negative.    Blood pressure 151/63, pulse 65, temperature 98.6 F (37 C), temperature source Oral, resp. rate 18, height $RemoveBe'4\' 11"'ncpZBCBSf$  (1.499 m), weight 83.799 kg (184 lb 11.9 oz), SpO2 97 %. Physical Exam  Constitutional: She appears well-developed and well-nourished. No distress.  HENT:  Head: Normocephalic and atraumatic.  Nose: Nose normal.  Mouth/Throat: Oropharynx is clear and moist.  Eyes: Conjunctivae and EOM are normal. Pupils are equal, round, and reactive to light.  Neck: Normal range of motion. Neck supple.  Cardiovascular: Normal rate, regular rhythm, normal heart sounds and intact distal pulses.   Respiratory: Effort normal and breath sounds normal. She has no wheezes.  GI: Soft. Bowel sounds are normal. There is no tenderness.  Neurological: She is alert. She is not disoriented. No cranial nerve deficit. GCS eye subscore is 4. GCS verbal subscore is 5. GCS motor subscore is 6.  A+Ox2 not time, nl speech and language, mild bradyphrenia PERRLA, EOMI, nl VF, face symmetric 5-/5 R UE, 5/5 L UE, 4/5 B LE, increased tone worse in B LE, tremor R UE > L UE, bradykinesia 1+/4 B, mute plantars Decreased temp on R arm  Skin: She is not diaphoretic.   CT of head personally reviewed by me and shows old R hemispheric infarcts, mild atrophy  Assessment/Plan: 1.  Possible stroke-  Pt does have new weakness on the R which does not correlate with CT changes plus has sensory changes to the R as well 2.  Parkinsons-  Uncontrolled, pt has severe increased tone in B LE which is likely the biggest contributor to gait difficulties 3.  Restless leg-  Pt has multiple complaints about this today 4.  Peripheral neuropathy-  Stable, likely from DM -  MRI of brain w/o contrast -  Increase Sinemet CR to 2 tabs 25/100 BID -  Restart requip 0.$RemoveBeforeDE'5mg'[id number]$  qHS -  Check lipids, hem A1c, ferritin,  TSH, B12/folate -  Continue therapy -  Will follow results in hospital  [facility], [place] 02/12/2015, 12:42 PM

## 2015-02-12 NOTE — Progress Notes (Signed)
Speech Therapy Note: reviewed chart notes of Barium study this AM which indicated poor Esophageal motility during the drinking of the liquid barium. ST will f/u w/ toleration of rec'd diet of mech soft w/ thin liquids; general aspiration and reflux precautions.

## 2015-02-12 NOTE — ED Notes (Signed)
ED tech at bedside to check VS on pt. Pt said to be hypotensive. MD aware. Pt alert and able to answer questions without difficulty. No increased work of breathing or acute distress noted at this time.

## 2015-02-12 NOTE — ED Provider Notes (Signed)
[facility] Emergency Department Provider Note  ____________________________________________  Time seen: on arrival to the ED  I have reviewed the triage vital signs and the nursing notes.   HISTORY  Chief Complaint Fall      HPI [name] is a 81 y.o. female presents with frequent falls. Patient admits to unsteady gait. She was walking with her walker in route to the bathroom unclear as to what caused her to fall patient does admit to striking posterior neck but has no pain in that area at this time. In addition patient admits to diarrhea 2 days. In addition patient states that she saw her primary care physician Dr. [name] yesterday who informed her that she had a urinary tract infection however she was not prescribed any antibiotics. In addition she stated that she was informed yesterday that she was also dehydrated as well however no intervention was made in that regard either per patient and husband.     Past Medical History  Diagnosis Date  . Anxiety   . CHF (congestive heart failure)   . Depression   . Diabetes mellitus     Followed by Dr. Tedd Sias at [facility]  . GERD (gastroesophageal reflux disease)   . Hypertension   . Hypothyroidism   . Osteoporosis   . Allergy   . Hyperlipidemia   . RLS (restless legs syndrome)   . Pancreatic disease     pancreatic failure  . Urinary incontinence   . Pancreatitis     Pancreatic resection/ open wound- [facility] 11/04  . Pneumonia     Pneumonia/ Emphysema 05/05  . 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 TIA (transient ischemic attack)     recurrent 12/15  . History of pleural empyema   . Sepsis   . Frequent falls   . Arthritis     left knee, [facility]  . CAD (coronary artery disease), native coronary artery      1//15  T mid-LAD  stent OM2  . Parkinson's disease     Patient Active Problem List   Diagnosis Date Noted  . Acute on chronic renal failure  02/10/2015  . Dysphagia, pharyngoesophageal phase 01/23/2015  . Urinary frequency 01/01/2015  . Malaise 11/13/2014  . Memory loss 10/23/2014  . CVA (cerebral infarction) 08/07/2014  . Left rotator cuff tear 08/07/2014  . Coronary artery disease 09/12/2013  . Dyspnea 06/20/2013  . Tremor 06/07/2012  . Osteoarthrosis, unspecified whether generalized or localized, involving lower leg 02/27/2012  . Open abdominal wall wound 04/12/2010  . THROMBOCYTOPENIA 07/30/2009  . Hyperlipidemia 10/27/2008  . ALLERGIC RHINITIS 07/21/2008  . HYPOTHYROIDISM 03/07/2007  . Diabetes type 2, controlled 03/07/2007  . ANXIETY 03/07/2007  . DEPRESSION 03/07/2007  . Essential hypertension 03/07/2007  . FAILURE, DIASTOLIC HEART, CHRONIC 03/07/2007  . GERD 03/07/2007    Past Surgical History  Procedure Laterality Date  . Breast lumpectomy      Lumpectomy right breast  . 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 Abdominal hysterectomy      Hysteroscopy/ D & C ([place]) 12/05  . Removal of pancreas  2004  . Kyphoplasty  2005  . Broken shoulder and orbital bone  2005  . Cholecystectomy  1973  . Thoracentesis    . Vaginal delivery      7  . Cardiac catheterization  08/29/2013    x1 stent @ [facility]  .  Cataract surgery      Current Outpatient Rx  Name  Route  Sig  Dispense  Refill  . ALPRAZolam (XANAX) 1 MG tablet      TAKE 1/2 TABLET IN THE MORNING TAKE 1/2 TABLET AT LUNCH AND TAKE 1 TABLET AT BEDTIME IF NEEDED   60 tablet   5   . amLODipine (NORVASC) 5 MG tablet   Oral   Take 1 tablet (5 mg total) by mouth daily.   30 tablet   3   . apixaban (ELIQUIS) 2.5 MG TABS tablet   Oral   Take 1 tablet (2.5 mg total) by mouth 2 (two) times daily.   60 tablet   6   . aspirin 81 MG tablet   Oral   Take 81 mg by mouth daily.           . busPIRone (BUSPAR) 10 MG tablet      TAKE ONE (1) TABLET BY MOUTH TWO (2) TIMES DAILY   180 tablet   0   .  Carbidopa-Levodopa ER (SINEMET CR) 25-100 MG tablet controlled release      TAKE ONE (1) TABLET BY MOUTH 3 TIMES DAILY   270 tablet   1   . cetirizine (ZYRTEC) 10 MG tablet   Oral   Take 10 mg by mouth daily.         . fluticasone (FLONASE) 50 MCG/ACT nasal spray      USE 2 SPRAYS EACH NOSTRIL DAILY   16 g   3   . furosemide (LASIX) 40 MG tablet   Oral   Take 1 tablet (40 mg total) by mouth daily.   90 tablet   3   . gabapentin (NEURONTIN) 100 MG capsule   Oral   Take 1 capsule (100 mg total) by mouth 3 (three) times daily.   90 capsule   3   . gentamicin ointment (GARAMYCIN) 0.1 %   Topical   Apply 1 application topically 3 (three) times daily.   30 g   0   . HYDROcodone-acetaminophen (NORCO/VICODIN) 5-325 MG per tablet   Oral   Take 1-2 tablets by mouth 3 (three) times daily as needed for moderate pain.   120 tablet   0   . insulin glargine (LANTUS) 100 UNIT/ML injection   Subcutaneous   Inject 0.55 mLs (55 Units total) into the skin at bedtime.   10 mL   6     Patient prefers NOT to use pens   . insulin lispro (HUMALOG) 100 UNIT/ML injection   Subcutaneous   Inject 0.08-0.18 mLs (8-18 Units total) into the skin 3 (three) times daily before meals.   10 mL   6   . levothyroxine (SYNTHROID, LEVOTHROID) 75 MCG tablet      TAKE ONE (1) TABLET EACH DAY   90 tablet   1   . lisinopril (PRINIVIL,ZESTRIL) 20 MG tablet      TAKE ONE TABLET TWICE DAILY   180 tablet   1   . metoprolol (TOPROL-XL) 200 MG 24 hr tablet   Oral   Take 1 tablet (200 mg total) by mouth daily.   90 tablet   3   . Multiple Vitamins-Minerals (CENTRUM SILVER PO)   Oral   Take 1 tablet by mouth daily.           Marland Kitchen omeprazole (PRILOSEC) 20 MG capsule      TAKE ONE CAPSULE BY MOUTH DAILY   30 capsule   6   .  ondansetron (ZOFRAN) 4 MG tablet   Oral   Take 1 tablet (4 mg total) by mouth every 8 (eight) hours as needed.   90 tablet   11   . pravastatin (PRAVACHOL) 40 MG  tablet   Oral   Take 1 tablet (40 mg total) by mouth daily.   30 tablet   5   . PROAIR HFA 108 (90 BASE) MCG/ACT inhaler      INHALE TWO PUFFS EVERY 6 HOURS AS NEEDEDFOR WHEEZE OR SHORTNESS OF BREATH   8.5 g   3   . rOPINIRole (REQUIP) 0.25 MG tablet   Oral   Take 0.25 mg by mouth at bedtime.         . triamcinolone cream (KENALOG) 0.1 %   Topical   Apply topically 2 (two) times daily.   30 g   0   . ULTICARE INSULIN SYRINGE 31G X 5/16" 0.5 ML MISC      AS DIRECTED   100 each   6   . ZENPEP 15000 UNITS CPEP      TAKE ONE (1) CAPSULE THREE (3) TIMES EACH DAY   100 capsule   6     Allergies Citalopram hydrobromide; Diphenhydramine; Diphenhydramine hcl; Metoclopramide; Metoclopramide hcl; Paroxetine; and Tape  Family History  Problem Relation Age of Onset  . Heart disease Mother   . Heart attack Mother   . Hypertension Mother   . Heart attack Father     Social History History  Substance Use Topics  . Smoking status: Never Smoker   . Smokeless tobacco: Never Used  . Alcohol Use: No    Review of Systems  Constitutional: Negative for fever. Eyes: Negative for visual changes. ENT: Negative for sore throat Cardiovascular: Negative for chest pain. Respiratory: Negative for shortness of breath. Gastrointestinal: Negative for abdominal pain, vomiting and diarrhea. Genitourinary: Negative for dysuria. Musculoskeletal: Negative for back pain. Skin: Negative for rash. Neurological: Negative for headaches or focal weakness   10-point ROS otherwise negative.  ____________________________________________   PHYSICAL EXAM:  VITAL SIGNS: ED Triage Vitals  Enc Vitals Group     BP 02/11/15 2250 115/62 mmHg     Pulse Rate 02/11/15 2250 74     Resp 02/11/15 2250 18     Temp 02/11/15 2250 98.4 F (36.9 C)     Temp Source 02/11/15 2250 Oral     SpO2 02/11/15 2250 99 %     Weight 02/11/15 2250 184 lb 11.9 oz (83.799 kg)     Height 02/11/15 2250 4\' 11"   (1.499 m)     Head Cir --      Peak Flow --      Pain Score --      Pain Loc --      Pain Edu? --      Excl. in GC? --      Constitutional: Alert and oriented. Well appearing and in no distress. Eyes: Conjunctivae are normal.  ENT   Head: Normocephalic and atraumatic.   Mouth/Throat: Mucous membranes are moist. Cardiovascular: Normal rate, regular rhythm. Normal and symmetric distal pulses are present in all extremities. No murmurs, rubs, or gallops. Respiratory: Normal respiratory effort without tachypnea nor retractions. Breath sounds are clear and equal bilaterally.  Gastrointestinal: Soft and non-tender in all quadrants. No distention. There is no CVA tenderness. Genitourinary: deferred Musculoskeletal: Nontender with normal range of motion in all extremities. No lower extremity tenderness nor edema. Neurologic:  Normal speech and language. No gross focal neurologic  deficits are appreciated. Skin:  Skin is warm, dry and intact. No rash noted. Psychiatric: Mood and affect are normal. Patient exhibits appropriate insight and judgment.  ____________________________________________    LABS (pertinent positives/negatives)  Labs Reviewed  CBC - Abnormal; Notable for the following:    RBC 3.57 (*)    Hemoglobin 11.0 (*)    HCT 34.0 (*)    Platelets 118 (*)    All other components within normal limits  COMPREHENSIVE METABOLIC PANEL - Abnormal; Notable for the following:    Glucose, Bld 161 (*)    BUN 30 (*)    Creatinine, Ser 1.45 (*)    Calcium 8.6 (*)    Total Protein 5.9 (*)    Albumin 3.0 (*)    ALT 7 (*)    Total Bilirubin 0.2 (*)    GFR calc non Af Amer 32 (*)    GFR calc Af Amer 37 (*)    All other components within normal limits  URINALYSIS COMPLETEWITH MICROSCOPIC ([facility] ONLY) - Abnormal; Notable for the following:    Color, Urine YELLOW (*)    APPearance HAZY (*)    Ketones, ur TRACE (*)    Bacteria, UA RARE (*)    Squamous Epithelial / LPF 0-5 (*)     All other components within normal limits  BASIC METABOLIC PANEL - Abnormal; Notable for the following:    Glucose, Bld 231 (*)    BUN 28 (*)    Creatinine, Ser 1.29 (*)    Calcium 8.2 (*)    GFR calc non Af Amer 37 (*)    GFR calc Af Amer 43 (*)    All other components within normal limits  CBC - Abnormal; Notable for the following:    RBC 3.37 (*)    Hemoglobin 10.3 (*)    HCT 32.1 (*)    Platelets 92 (*)    All other components within normal limits  LIPID PANEL - Abnormal; Notable for the following:    Triglycerides 189 (*)    All other components within normal limits  GLUCOSE, CAPILLARY - Abnormal; Notable for the following:    Glucose-Capillary 244 (*)    All other components within normal limits  GLUCOSE, CAPILLARY - Abnormal; Notable for the following:    Glucose-Capillary 189 (*)    All other components within normal limits  GLUCOSE, CAPILLARY - Abnormal; Notable for the following:    Glucose-Capillary 321 (*)    All other components within normal limits  GLUCOSE, CAPILLARY - Abnormal; Notable for the following:    Glucose-Capillary 244 (*)    All other components within normal limits  C DIFFICILE QUICK SCAN W PCR REFLEX ([facility] ONLY)  TROPONIN I  TROPONIN I  TROPONIN I  MAGNESIUM  PHOSPHORUS  TSH  FERRITIN  VITAMIN B12  FOLATE  HEMOGLOBIN A1C  CBC  BASIC METABOLIC PANEL    ____________________________________________   EKG interpreted by me Dr. Bayard Males   Date: 02/12/2015  Rate: 68  Rhythm: normal sinus rhythm  QRS Axis: normal  Intervals: normal  ST/T Wave abnormalities: normal  Conduction Disutrbances: none  Narrative Interpretation: unremarkable      ____________________________________________    RADIOLOGY CT head revealed:  IMPRESSION: 1. No acute intracranial process identified. 2. Generalized cerebral atrophy with chronic microvascular ischemic disease and remote infarcts involving the right frontoparietal region and right  cerebellar hemisphere.  ____________________________________________   PROCEDURES  Procedure(s) performed: none  Critical Care performed: none  ____________________________________________   INITIAL IMPRESSION /  ASSESSMENT AND PLAN / ED COURSE  Pertinent labs & imaging results that were available during my care of the patient were reviewed by me and considered in my medical decision making (see chart for details).    ____________________________________________   FINAL CLINICAL IMPRESSION(S) / ED DIAGNOSES  Final diagnoses:  Dysphagia  CVA (cerebral infarction)  Stroke     Darci Current, MD 02/12/15 2356

## 2015-02-12 NOTE — H&P (Signed)
[facility] - [place] at [facility]   PATIENT NAME: Lovinia [name]    MR#:  [id number]  DATE OF BIRTH:  04-14-1929  DATE OF ADMISSION:  02/11/2015  PRIMARY CARE PHYSICIAN: Wynona Dove, MD   REQUESTING/REFERRING PHYSICIAN: Manson Passey  CHIEF COMPLAINT:   Chief Complaint  Patient presents with  . Fall    HISTORY OF PRESENT ILLNESS:  [name]  is a 81 y.o. female who presents with frequent falls. Patient has a somewhat complicated history with significant comorbid medical conditions. She states that for the past few days she has been falling frequently, and that she also has noticed some left facial perioral numbness, with some right greater than left-sided weakness, and some "difficulty with speech". She is a poor historian and is unable to elaborate much more on that complaint. She does state that she feels like she may have been having small strokes. She does have a chronic abdominal wall wound which has had some intermittent bleeding recently. Complicating this is the fact that she is on Eliquis. She has also had some diarrhea for the past couple days, and in the ED was found to be fairly hypotensive, but blood pressure responded well to fluid administration. Of note, her abdominal wall wound is from prior surgery for pancreatic resection, after which she has been on enzyme replacement. Hospitalists were called for workup of her neurological complaints.  PAST MEDICAL HISTORY:   Past Medical History  Diagnosis Date  . Anxiety   . Chronic diastolic CHF (congestive heart failure)   . Depression   . Diabetes mellitus     Followed by Dr. Tedd Sias at [facility]  . GERD (gastroesophageal reflux disease)   . Hypertension   . Hypothyroidism   . Osteoporosis   . Allergy   . Hyperlipidemia   . RLS (restless legs syndrome)   . Pancreatic disease     pancreatic failure  . Urinary incontinence   . Pancreatitis     Pancreatic resection/ open wound- [facility] 11/04  .  Pneumonia     Pneumonia/ Emphysema 05/05  . 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 TIA (transient ischemic attack)     recurrent 12/15  . History of pleural empyema   . Sepsis   . Frequent falls   . Arthritis     left knee, [facility]  . CAD (coronary artery disease), native coronary artery      1//15  T mid-LAD  stent OM2  . Parkinson's disease     PAST SURGICAL HISTORY:   Past Surgical History  Procedure Laterality Date  . Breast lumpectomy      Lumpectomy right breast  . 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 Abdominal hysterectomy      Hysteroscopy/ D & C ([facility]) 12/05  . Removal of pancreas  2004  . Kyphoplasty  2005  . Broken shoulder and orbital bone  2005  . Cholecystectomy  1973  . Thoracentesis    . Vaginal delivery      9  . Cardiac catheterization  08/29/2013    x1 stent @ [facility]  . Cataract surgery      SOCIAL HISTORY:   History  Substance Use Topics  . Smoking status: Never Smoker   . Smokeless tobacco: Never Used  . Alcohol Use: No    FAMILY HISTORY:   Family History  Problem Relation Age of Onset  .  Heart disease Mother   . Heart attack Mother   . Hypertension Mother   . Heart attack Father     DRUG ALLERGIES:   Allergies  Allergen Reactions  . Citalopram Hydrobromide     REACTION: nausea, dizzy, dry mouth  . Diphenhydramine     Other reaction(s): Unknown  . Diphenhydramine Hcl     REACTION: unspecified  . Metoclopramide     Other reaction(s): Unknown  . Metoclopramide Hcl     REACTION: unspecified  . Paroxetine     REACTION: hallucinations  . Tape Other (See Comments) and Rash    MEDICATIONS AT HOME:   Prior to Admission medications   Medication Sig Start Date End Date Taking? Authorizing Provider  ALPRAZolam Prudy Feeler) 1 MG tablet TAKE 1/2 TABLET IN THE MORNING TAKE 1/2 TABLET AT LUNCH AND TAKE 1 TABLET AT BEDTIME IF NEEDED 12/02/14    Shelia Media, MD  amLODipine (NORVASC) 5 MG tablet Take 1 tablet (5 mg total) by mouth daily. 11/19/14   Iran Ouch, MD  apixaban (ELIQUIS) 2.5 MG TABS tablet Take 1 tablet (2.5 mg total) by mouth 2 (two) times daily. 01/27/15   Duke Salvia, MD  aspirin 81 MG tablet Take 81 mg by mouth daily.      Historical Provider, MD  busPIRone (BUSPAR) 10 MG tablet TAKE ONE (1) TABLET BY MOUTH TWO (2) TIMES DAILY 12/29/14   Shelia Media, MD  Carbidopa-Levodopa ER (SINEMET CR) 25-100 MG tablet controlled release TAKE ONE (1) TABLET BY MOUTH 3 TIMES DAILY 12/15/14   Shelia Media, MD  cetirizine (ZYRTEC) 10 MG tablet Take 10 mg by mouth daily.    Historical Provider, MD  fluticasone (FLONASE) 50 MCG/ACT nasal spray USE 2 SPRAYS EACH NOSTRIL DAILY 10/17/14   Shelia Media, MD  furosemide (LASIX) 40 MG tablet Take 1 tablet (40 mg total) by mouth daily. 12/02/14   Iran Ouch, MD  gabapentin (NEURONTIN) 100 MG capsule Take 1 capsule (100 mg total) by mouth 3 (three) times daily. 03/27/14   Shelia Media, MD  gentamicin ointment (GARAMYCIN) 0.1 % Apply 1 application topically 3 (three) times daily. 02/03/14   Shelia Media, MD  HYDROcodone-acetaminophen (NORCO/VICODIN) 5-325 MG per tablet Take 1-2 tablets by mouth 3 (three) times daily as needed for moderate pain. 02/05/15   Shelia Media, MD  insulin glargine (LANTUS) 100 UNIT/ML injection Inject 0.55 mLs (55 Units total) into the skin at bedtime. 02/10/15   Shelia Media, MD  insulin lispro (HUMALOG) 100 UNIT/ML injection Inject 0.08-0.18 mLs (8-18 Units total) into the skin 3 (three) times daily before meals. 10/23/14   Shelia Media, MD  levothyroxine (SYNTHROID, LEVOTHROID) 75 MCG tablet TAKE ONE (1) TABLET EACH DAY 01/21/15   Shelia Media, MD  lisinopril (PRINIVIL,ZESTRIL) 20 MG tablet TAKE ONE TABLET TWICE DAILY 08/01/14   Shelia Media, MD  metoprolol (TOPROL-XL) 200 MG 24 hr tablet Take 1 tablet (200 mg total) by  mouth daily. 09/10/14   Iran Ouch, MD  Multiple Vitamins-Minerals (CENTRUM SILVER PO) Take 1 tablet by mouth daily.      Historical Provider, MD  omeprazole (PRILOSEC) 20 MG capsule TAKE ONE CAPSULE BY MOUTH DAILY 10/17/14   Shelia Media, MD  ondansetron (ZOFRAN) 4 MG tablet Take 1 tablet (4 mg total) by mouth every 8 (eight) hours as needed. 01/18/13   Shelia Media, MD  pravastatin (PRAVACHOL) 40 MG tablet Take 1 tablet (  40 mg total) by mouth daily. 09/05/14   Shelia Media, MD  PROAIR HFA 108 (90 BASE) MCG/ACT inhaler INHALE TWO PUFFS EVERY 6 HOURS AS NEEDEDFOR WHEEZE OR SHORTNESS OF BREATH 02/04/15   Shelia Media, MD  rOPINIRole (REQUIP) 0.25 MG tablet Take 0.25 mg by mouth at bedtime.    Historical Provider, MD  triamcinolone cream (KENALOG) 0.1 % Apply topically 2 (two) times daily. 10/11/12   Shelia Media, MD  Stann Ore INSULIN SYRINGE 31G X 5/16" 0.5 ML MISC AS DIRECTED 12/26/14   Shelia Media, MD  ZENPEP 15000 UNITS CPEP TAKE ONE (1) CAPSULE THREE (3) TIMES EACH DAY 12/15/14   Shelia Media, MD    REVIEW OF SYSTEMS:  Review of Systems  Constitutional: Negative for fever, chills, weight loss and malaise/fatigue.  HENT: Negative for ear pain, hearing loss and tinnitus.   Eyes: Negative for blurred vision, double vision, pain and redness.  Respiratory: Negative for cough, hemoptysis and shortness of breath.   Cardiovascular: Negative for chest pain, palpitations, orthopnea and leg swelling.  Gastrointestinal: Positive for diarrhea. Negative for nausea, vomiting, abdominal pain and constipation.  Genitourinary: Negative for dysuria, frequency and hematuria.  Musculoskeletal: Positive for falls. Negative for back pain, joint pain and neck pain.  Skin:       No acne, rash, or lesions  Neurological: Positive for tingling (left perioral), focal weakness (right arm and leg) and weakness. Negative for dizziness and tremors.  Endo/Heme/Allergies: Negative for  polydipsia. Does not bruise/bleed easily.  Psychiatric/Behavioral: Negative for depression. The patient is not nervous/anxious and does not have insomnia.      VITAL SIGNS:   Filed Vitals:   02/11/15 2250 02/12/15 0026 02/12/15 0115 02/12/15 0130  BP: 115/62 82/54 116/59 103/59  Pulse: 74 70 68 64  Temp: 98.4 F (36.9 C)     TempSrc: Oral     Resp: 18 18 18 18   Height: 4\' 11"  (1.499 m)     Weight: 83.799 kg (184 lb 11.9 oz)     SpO2: 99% 99% 98% 89%   Wt Readings from Last 3 Encounters:  02/11/15 83.799 kg (184 lb 11.9 oz)  02/10/15 82.01 kg (180 lb 12.8 oz)  02/08/15 82.555 kg (182 lb)    PHYSICAL EXAMINATION:  Physical Exam  Constitutional: She is oriented to person, place, and time. She appears well-developed and well-nourished. No distress.  HENT:  Head: Normocephalic and atraumatic.  Mouth/Throat: Oropharynx is clear and moist.  Eyes: Conjunctivae and EOM are normal. Pupils are equal, round, and reactive to light. No scleral icterus.  Neck: Normal range of motion. Neck supple. No JVD present. No thyromegaly present.  Cardiovascular: Normal rate, regular rhythm and intact distal pulses.  Exam reveals no gallop and no friction rub.   No murmur heard. Respiratory: Effort normal and breath sounds normal. No respiratory distress. She has no wheezes. She has no rales.  GI: Soft. Bowel sounds are normal. She exhibits no distension. There is no tenderness.  Musculoskeletal: Normal range of motion. She exhibits no edema.  No arthritis, no gout  Lymphadenopathy:    She has no cervical adenopathy.  Neurological: She is alert and oriented to person, place, and time.  Neurologic: Cranial nerves II-XII - displays some right facial weakness and rightward tongue deviation, Sensation intact to light touch/pinprick, 4/5 strength in RUE and RLE with 5/5 strength Left side, no dysarthria, no aphasia, memory intact, no pronator drift, DTR intact.  Gait testing deferred   Skin:  Skin is warm  and dry. No rash noted. No erythema.  Psychiatric: She has a normal mood and affect. Her behavior is normal. Judgment and thought content normal.    LABORATORY PANEL:   CBC  Recent Labs Lab 02/12/15 0044  WBC 9.0  HGB 11.0*  HCT 34.0*  PLT 118*   ------------------------------------------------------------------------------------------------------------------  Chemistries   Recent Labs Lab 02/12/15 0044  NA 142  K 4.6  CL 107  CO2 29  GLUCOSE 161*  BUN 30*  CREATININE 1.45*  CALCIUM 8.6*  AST 25  ALT 7*  ALKPHOS 72  BILITOT 0.2*   ------------------------------------------------------------------------------------------------------------------  Cardiac Enzymes No results for input(s): TROPONINI in the last 168 hours. ------------------------------------------------------------------------------------------------------------------  RADIOLOGY:  No results found.  EKG:   Orders placed or performed in visit on 12/16/14  . EKG 12-Lead    IMPRESSION AND PLAN:  Principal Problem:   Frequent falls - questionable new stroke versus, as the patient suspects, multiple small strokes. We'll pursue workup to include imaging, starting with CT head which has been ordered, neurology consult, lipid panel, echocardiogram for now. Active Problems:   Open abdominal wall wound - wound consult for recommendations for management of this chronic wound.   CVA (cerebral infarction) - history of the same, workup for possible new stroke as above. Patient is a poor historian and so it is difficult to tell if her symptoms are old and chronic or new in onset, though she claims that there are new in onset.   Diabetes type 2, controlled - continue lower dose of home Lantus, impaired with inpatient sliding scale insulin.   Essential hypertension - blood pressure is actually low here requiring fluids, if held her antihypertensives at this time, these can be restarted when her blood pressure  improves.   FAILURE, DIASTOLIC HEART, CHRONIC - have held home diuretic for now.   Coronary artery disease - continue appropriate home meds, except we have held those that might lower her blood pressure.   CKD (chronic kidney disease), stage III - avoid nephrotoxins, monitor closely.   H/O resection of pancreas - continue home dose of pancreatic replacement enzymes   Hypothyroidism - continue home thyroid replacement, check TSH.   GERD - continue home dose PPI   Anxiety - we have held several medications as they may be contributing to her falls, and confounding the picture this time. He can be restarted as diagnosis becomes more clear.  All the records are reviewed and case discussed with ED provider. Management plans discussed with the patient and/or family.  DVT PROPHYLAXIS: Systemic anticoagulation  ADMISSION STATUS: Inpatient  CODE STATUS: DO NOT RESUSCITATE  TOTAL TIME TAKING CARE OF THIS PATIENT: 55 minutes.    Shirlette Scarber FIELDING 02/12/2015, 2:54 AM  [facility]  Office  [phone]  CC: Primary care physician; Wynona Dove, MD

## 2015-02-12 NOTE — Telephone Encounter (Signed)
Stephanie from radiology called states the out patient order for the Esophagus study needs to be cancelled and an in patient order needs to be placed as pt was admitted yesterday.  Please advise

## 2015-02-12 NOTE — Care Management (Signed)
Patient presents from home.  She lives alone and  Has 7 children that lives in the area.  Patient says she has med alert and receives medicaid PCS services 5 days a week four hours each day.  She is also receiving home health nursing visits 3 x weekly and seen at the wound care center twice weekly for a chronic abd wound of 12 years in duration from a pancreatic surgery.  Notified [facility] of admission. Patient presented with sx concerning for cva.  MRI pending.  Patient says that all of a sudden, it felt like there were weights on her feet and she could not move therm.  PT consult is pending. Speech has cleared

## 2015-02-12 NOTE — Progress Notes (Signed)
[facility] - [place] at [facility]   PATIENT NAME: [name]    MR#:  [id number]  DATE OF BIRTH:  01-14-1929  SUBJECTIVE:  CHIEF COMPLAINT:   Chief Complaint  Patient presents with  . Fall   patient here with recurrent falls. Presently denies any headache, numbness, tingling in any seizure type activity.  REVIEW OF SYSTEMS:    Review of Systems  Constitutional: Negative for fever and weight loss.  HENT: Negative for congestion, nosebleeds and tinnitus.   Eyes: Negative for blurred vision, double vision and redness.  Respiratory: Negative for cough, hemoptysis and shortness of breath.   Cardiovascular: Negative for chest pain, orthopnea, leg swelling and PND.  Gastrointestinal: Negative for nausea, vomiting, abdominal pain, diarrhea and melena.  Genitourinary: Negative for dysuria, urgency and hematuria.  Musculoskeletal: Negative for joint pain and falls.  Neurological: Positive for weakness (genearlized). Negative for dizziness, tingling, sensory change, focal weakness, seizures and headaches.  Endo/Heme/Allergies: Negative for polydipsia. Does not bruise/bleed easily.  Psychiatric/Behavioral: Negative for depression and memory loss. The patient is not nervous/anxious.     Nutrition: Heart Healthy Tolerating Diet: Yes Tolerating PT: Eval done and they recommending SNF.   DRUG ALLERGIES:   Allergies  Allergen Reactions  . Citalopram Hydrobromide     REACTION: nausea, dizzy, dry mouth  . Diphenhydramine     Other reaction(s): Unknown  . Diphenhydramine Hcl     REACTION: unspecified  . Metoclopramide     Other reaction(s): Unknown  . Metoclopramide Hcl     REACTION: unspecified  . Paroxetine     REACTION: hallucinations  . Tape Other (See Comments) and Rash    VITALS:  Blood pressure 151/63, pulse 65, temperature 98.6 F (37 C), temperature source Oral, resp. rate 18, height  (1.499 m), weight 83.799 kg (184 lb 11.9 oz), SpO2 97  %.  PHYSICAL EXAMINATION:   Physical Exam  GENERAL:  81 y.o.-year-old obese patient lying in the bed with no acute distress.  EYES: Pupils equal, round, reactive to light and accommodation. No scleral icterus. Extraocular muscles intact.  HEENT: Head atraumatic, normocephalic. Oropharynx and nasopharynx clear.  NECK:  Supple, no jugular venous distention. No thyroid enlargement, no tenderness.  LUNGS: Normal breath sounds bilaterally, no wheezing, rales, rhonchi. No use of accessory muscles of respiration.  CARDIOVASCULAR: S1, S2 RRR. No murmurs, rubs, gallops, clicks.  ABDOMEN: Soft, nontender, nondistended. Bowel sounds present. No organomegaly or mass.  EXTREMITIES: No cyanosis, clubbing,  + 1 edema b/l.    NEUROLOGIC: Cranial nerves II through XII are intact. No focal Motor or sensory deficits b/l. Globally weak.  PSYCHIATRIC: The patient is alert and oriented x 3. Good affect.  SKIN: No obvious rash, lesion, or ulcer.    LABORATORY PANEL:   CBC  Recent Labs Lab 02/12/15 0608  WBC 5.5  HGB 10.3*  HCT 32.1*  PLT 92*   ------------------------------------------------------------------------------------------------------------------  Chemistries   Recent Labs Lab 02/12/15 0044 02/12/15 0608  NA 142 144  K 4.6 5.1  CL 107 109  CO2 29 27  GLUCOSE 161* 231*  BUN 30* 28*  CREATININE 1.45* 1.29*  CALCIUM 8.6* 8.2*  MG  --  2.0  AST 25  --   ALT 7*  --   ALKPHOS 72  --   BILITOT 0.2*  --    ------------------------------------------------------------------------------------------------------------------  Cardiac Enzymes  Recent Labs Lab 02/12/15 1217  TROPONINI <0.03   ------------------------------------------------------------------------------------------------------------------  RADIOLOGY:  Ct Head Wo Contrast  02/12/2015  CLINICAL DATA:  Initial evaluation for acute right face/ upper extremity weakness  EXAM: CT HEAD WITHOUT CONTRAST  TECHNIQUE:  Contiguous axial images were obtained from the base of the skull through the vertex without intravenous contrast.  COMPARISON:  Prior study from 07/30/2014  FINDINGS: Diffuse prominence of the CSF containing spaces is compatible with generalized cerebral atrophy. Chronic small vessel ischemic changes present within the periventricular and deep white matter both cerebral hemispheres. Scattered small remote infarcts present within the right frontoparietal region. Small remote right cerebellar infarcts also noted. Scattered vascular calcifications present within the carotid siphons and distal left vertebral artery.  No acute large vessel territory infarct identified. Gray-white matter differentiation is maintained. No acute intracranial hemorrhage.  No mass lesion, mass effect, or midline shift. No hydrocephalus. No extra-axial fluid collection.  Scalp soft tissues within normal limits. No acute abnormality about the orbits.  Calvarium intact.  Mild layering opacity present within the lateral recess of the left sphenoid sinus. Paranasal sinuses are otherwise largely clear. No mastoid effusion.  IMPRESSION: 1. No acute intracranial process identified. 2. Generalized cerebral atrophy with chronic microvascular ischemic disease and remote infarcts involving the right frontoparietal region and right cerebellar hemisphere.   Electronically Signed   By: Rise Mu M.D.   On: 02/12/2015 03:32     ASSESSMENT AND PLAN:   81 year old female with past medical history of type 2 diabetes, obesity, hypothyroidism, hypertension, history of CHF, history of previous TIA, history of pancreatitis with pancreatic resection, osteoporosis, depression, who presented to the hospital due to recurrent falls.  #1 recurrent falls-the exact etiology of this is unclear possibly suspected to the underlying stroke although CT head on admission was negative. -Patient was seen by neurology and will get MRI brain. - increase her dose  of Sinemet given parkinson's disease.  - Check TSH, Folate/B12.  - seen by PT and they recommend SNF and CM made aware.   #2 type 2 diabetes-continue Lantus, sliding scale insulin. Blood sugar stable.  #3 hypothyroidism-continue Synthroid.  #4 restless leg syndrome-continue Requip.  #5 hyperlipidemia-continue Pravachol.  #6 GERD-continue Protonix.  #7 chronic history of chronic pancreatitis - continue Creon  #8 chronic abdominal wound-this is a result of patient's previous pancreatic resection. wound team has been consulted and continue local wound care. No evidence of acute infection.  #9 paroxysmal atrial fibrillation-patient is currently rate controlled. Will resume metoprolol. -Patient is a high fall risk and therefore we'll discontinue Eliquis. -Patient is followed by Dr. Kirke Corin.      All the records are reviewed and case discussed with Care Management/Social Workerr. Management plans discussed with the patient, family and they are in agreement.  CODE STATUS: DO NOT RESUSCITATE  DVT Prophylaxis: Teds and SCDs  TOTAL TIME TAKING CARE OF THIS PATIENT: 30 minutes.   POSSIBLE D/C IN 2-3 DAYS, DEPENDING ON CLINICAL CONDITION.   Houston Siren M.D on 02/12/2015 at 1:36 PM  Between 7am to 6pm - Pager - [phone]  After 6pm go to [email or web address] - password [email or web address] [facility]  [facility]  Office  [phone]  CC: Primary care physician; Wynona Dove, MD

## 2015-02-12 NOTE — Evaluation (Signed)
Physical Therapy Evaluation Patient Details Name: [name] MRN: [id number] DOB: November 17, 1928 Today's Date: 02/12/2015   History of Present Illness  81 yo female with onset of GI symptoms and scheduled endoscopy,  recent falls and  R side weakness, L facial numbness, now referred for PT  Clinical Impression  Pt  Is having some difficulty with standing control and tolerance for activity, passing some blood and pt was leaving for her endoscopic procedure just after noting this.  Reported to nursing to get it checked when pt returned.  Plan is to send to SNF as pt is not going to be able to care for herself at home adequately.    Follow Up Recommendations SNF    Equipment Recommendations  None recommended by PT    Recommendations for Other Services       Precautions / Restrictions Precautions Precautions: Fall Restrictions Weight Bearing Restrictions: No      Mobility  Bed Mobility Overal bed mobility: Needs Assistance Bed Mobility: Supine to Sit;Sit to Supine     Supine to sit: Min assist Sit to supine: Min assist   General bed mobility comments: HOB elevated and bedrails used  Transfers Overall transfer level: Needs assistance Equipment used: Rolling walker (2 wheeled);1 person hand held assist Transfers: Sit to/from [employer] Sit to Stand: Min assist Stand pivot transfers: Min assist          Ambulation/Gait Ambulation/Gait assistance: Min assist Ambulation Distance (Feet): 5 Feet Assistive device: Rolling walker (2 wheeled);1 person hand held assist Gait Pattern/deviations: Step-to pattern;Shuffle;Trunk flexed Gait velocity: reduced' Gait velocity interpretation: Below normal speed for age/gender General Gait Details: sidestepping to chair  Stairs            Wheelchair Mobility    Modified Rankin (Stroke Patients Only)       Balance Overall balance assessment: Needs assistance   Sitting balance-Leahy Scale: Fair      Standing balance support: Bilateral upper extremity supported Standing balance-Leahy Scale: Poor                               Pertinent Vitals/Pain Pain Assessment: No/denies pain    Home Living Family/patient expects to be discharged to:: Private residence Living Arrangements: Alone Available Help at Discharge: Family;Friend(s) Type of Home: House Home Access: Ramped entrance     Home Layout: Two level;Able to live on main level with bedroom/bathroom Home Equipment: Walker - 4 wheels      Prior Function Level of Independence: Independent with assistive device(s)               Hand Dominance        Extremity/Trunk Assessment   Upper Extremity Assessment: Overall WFL for tasks assessed           Lower Extremity Assessment: Generalized weakness      Cervical / Trunk Assessment: Kyphotic  Communication   Communication: No difficulties  Cognition Arousal/Alertness: Awake/alert Behavior During Therapy: WFL for tasks assessed/performed Overall Cognitive Status: Within Functional Limits for tasks assessed                      General Comments General comments (skin integrity, edema, etc.): Closely supervised pt on the [facility] but she was able to maintain sitting control and to work well with partial standing before needing more help to power up    Exercises        Assessment/Plan  PT Assessment Patient needs continued PT services  PT Diagnosis Generalized weakness   PT Problem List Decreased strength;Decreased range of motion;Decreased activity tolerance;Decreased balance;Decreased mobility;Decreased coordination;Decreased knowledge of use of DME;Cardiopulmonary status limiting activity;Obesity  PT Treatment Interventions DME instruction;Gait training;Stair training;Functional mobility training;Therapeutic activities;Therapeutic exercise;Balance training;Neuromuscular re-education;Patient/family education   PT Goals (Current goals can  be found in the Care Plan section) Acute Rehab PT Goals Patient Stated Goal: get up to BR PT Goal Formulation: With patient Time For Goal Achievement: 02/26/15 Potential to Achieve Goals: Good    Frequency Min 2X/week   Barriers to discharge Inaccessible home environment;Decreased caregiver support home alone    Co-evaluation               End of Session   Activity Tolerance: Patient limited by lethargy;Patient tolerated treatment well Patient left: in bed;with call bell/phone within reach;with nursing/sitter in room Nurse Communication: Mobility status;Other (comment) (bleeding in peri area)         Time: 0962-8366 PT Time Calculation (min) (ACUTE ONLY): 18 min   Charges:   PT Evaluation $Initial PT Evaluation Tier I: 1 Procedure     PT G CodesIvar Drape 2015/02/20, 12:38 PM   Samul Dada, PT MS Acute Rehab Dept. Number: [facility] [id number] and [facility] [phone]

## 2015-02-12 NOTE — ED Notes (Signed)
Patient transported to CT 

## 2015-02-12 NOTE — Progress Notes (Signed)
*  PRELIMINARY RESULTS* Echocardiogram 2D Echocardiogram has been performed.  [name] 02/12/2015, 8:32 AM

## 2015-02-12 NOTE — Consult Note (Addendum)
WOC wound consult note Reason for Consult: Consult requested for abd wound.  Pt had surgery several years ago and states that some patchy areas of the wound never healed completely. Wound type: Chronic full thickness wound/scar tissue Measurement: 8X8cm area of pink dry scar tissue with small patchy areas of dry yellow scabbing which were previously open and draining , according to patient who is well-informed regarding topical treatment.  She has been using silver dressings prior to admission when it has drainage.  Wound bed: Currently, there is no odor, drainage, or open wounds located throughout the scar tissue. Dressing procedure/placement/frequency: Foam dressing to protect scar tissue from further injury.  Pt can resume previous plan of care after discharge if open areas re-occur. Discussed plan of care with patient and she verbalizes understanding. Please re-consult if further assistance is needed.  Thank-you,  Cammie Mcgee MSN, RN, CWOCN, [employer], CNS [phone]

## 2015-02-13 ENCOUNTER — Other Ambulatory Visit: Payer: Self-pay | Admitting: Internal Medicine

## 2015-02-13 ENCOUNTER — Encounter: Payer: Self-pay | Admitting: Internal Medicine

## 2015-02-13 ENCOUNTER — Telehealth: Payer: Self-pay | Admitting: Internal Medicine

## 2015-02-13 ENCOUNTER — Telehealth: Payer: Self-pay

## 2015-02-13 ENCOUNTER — Ambulatory Visit (INDEPENDENT_AMBULATORY_CARE_PROVIDER_SITE_OTHER): Payer: Medicare Other | Admitting: *Deleted

## 2015-02-13 ENCOUNTER — Other Ambulatory Visit: Payer: Self-pay | Admitting: *Deleted

## 2015-02-13 DIAGNOSIS — I638 Other cerebral infarction: Secondary | ICD-10-CM | POA: Diagnosis not present

## 2015-02-13 DIAGNOSIS — R0602 Shortness of breath: Secondary | ICD-10-CM

## 2015-02-13 DIAGNOSIS — R000 Tachycardia, unspecified: Secondary | ICD-10-CM

## 2015-02-13 DIAGNOSIS — I6389 Other cerebral infarction: Secondary | ICD-10-CM

## 2015-02-13 LAB — BASIC METABOLIC PANEL
Anion gap: 6 (ref 5–15)
BUN: 20 mg/dL (ref 6–20)
CHLORIDE: 108 mmol/L (ref 101–111)
CO2: 27 mmol/L (ref 22–32)
Calcium: 8.6 mg/dL — ABNORMAL LOW (ref 8.9–10.3)
Creatinine, Ser: 1.11 mg/dL — ABNORMAL HIGH (ref 0.44–1.00)
GFR, EST AFRICAN AMERICAN: 52 mL/min — AB (ref >60)
GFR, EST NON AFRICAN AMERICAN: 45 mL/min — AB (ref >60)
Glucose, Bld: 247 mg/dL — ABNORMAL HIGH (ref 65–99)
POTASSIUM: 4.8 mmol/L (ref 3.5–5.1)
SODIUM: 141 mmol/L (ref 135–145)

## 2015-02-13 LAB — GLUCOSE, CAPILLARY
GLUCOSE-CAPILLARY: 216 mg/dL — AB (ref 65–99)
Glucose-Capillary: 232 mg/dL — ABNORMAL HIGH (ref 65–99)

## 2015-02-13 LAB — CBC
HCT: 30.3 % — ABNORMAL LOW (ref 35.0–47.0)
Hemoglobin: 9.8 g/dL — ABNORMAL LOW (ref 12.0–16.0)
MCH: 30.7 pg (ref 26.0–34.0)
MCHC: 32.2 g/dL (ref 32.0–36.0)
MCV: 95.4 fL (ref 80.0–100.0)
PLATELETS: 86 10*3/uL — AB (ref 150–440)
RBC: 3.17 MIL/uL — ABNORMAL LOW (ref 3.80–5.20)
RDW: 14.3 % (ref 11.5–14.5)
WBC: 5.2 10*3/uL (ref 3.6–11.0)

## 2015-02-13 LAB — HEMOGLOBIN A1C: HEMOGLOBIN A1C: 8.5 % — AB (ref 4.0–6.0)

## 2015-02-13 MED ORDER — ROPINIROLE HCL 0.5 MG PO TABS: 0.5000 mg | ORAL_TABLET | Freq: Every day | ORAL | Status: DC

## 2015-02-13 MED ORDER — CARBIDOPA-LEVODOPA ER 25-100 MG PO TBCR: 2.0000 | EXTENDED_RELEASE_TABLET | Freq: Two times a day (BID) | ORAL | Status: DC

## 2015-02-13 MED ORDER — METOPROLOL TARTRATE 100 MG PO TABS: 100.0000 mg | ORAL_TABLET | Freq: Every day | ORAL | Status: DC

## 2015-02-13 MED ORDER — APIXABAN 2.5 MG PO TABS
2.5000 mg | ORAL_TABLET | Freq: Two times a day (BID) | ORAL | Status: DC
Start: 2015-02-13 — End: 2015-02-13

## 2015-02-13 NOTE — Progress Notes (Signed)
Speech Therapy Note: reviewed chart notes; consulted NSG/pt. Both denied any s/s of aspiration w/ po's/meals. NSG reported pt is taking meds w/ water appropriately. Pt does have Esophageal dysmotility(baseline) for her. Barium study yesterday noted such. Reviewed general aspiration and reflux precautions. Left handouts. ST will sign off as pt appears at her baseline at this time. NSG to reconsult if nec. NSG agreed.

## 2015-02-13 NOTE — Progress Notes (Signed)
Inpatient Diabetes Program Recommendations  AACE/ADA: New Consensus Statement on Inpatient Glycemic Control (2013)  Target Ranges:  Prepandial:   less than 140 mg/dL      Peak postprandial:   less than 180 mg/dL (1-2 hours)      Critically ill patients:  140 - 180 mg/dL     Results for [name], [name] (MRN [id number]) as of 02/13/2015 11:09  Ref. Range 02/12/2015 07:35 02/12/2015 11:46 02/12/2015 16:20 02/12/2015 20:32  Glucose-Capillary Latest Ref Range: 65-99 mg/dL 409 (H) 811 (H) 914 (H) 244 (H)    Results for [name], [name] (MRN [id number]) as of 02/13/2015 11:09  Ref. Range 02/13/2015 07:18  Glucose-Capillary Latest Ref Range: 65-99 mg/dL 086 (H)     Admit with: Falls  History: DM, CHF, HTN, CKD, Parkinsons  Home DM Meds: Information obtained through Care Everywhere tab of EPIC- Patient saw her Endocrinologist Dr. Tedd Sias on 11/21/14 and was instructed to take the following:       Lantus 45 units QHS       Humalog SSI as follows:       100-180 mg/dl- 12 units          578-469 mg/dl- 14 units        629-528 mg/dl- 16 units              >301 mg/dl- 18 units  Current DM Orders: Lantus 30 units QHS            Novolog Sensitive SSI tid ac + HS    MD- Please consider the following in-hospital insulin adjustments:  1. Increase Lantus to 45 units QHS (home dose)  2. Add Novolog Meal Coverage- Novolog 4 units tid with meals [Please use Glycemic Control Order set to order Novolog Meal Coverage]    Will follow Ambrose Finland RN, MSN, CDE Diabetes Coordinator Inpatient Glycemic Control Team Team Pager: [phone] (8a-5p)

## 2015-02-13 NOTE — Telephone Encounter (Signed)
S/w daughter concerning Rx refill. She is aware that PA was done on 01/30/2015 and pt should have been approved for 1 yr. I sent Rx for Eliquis 2.5 mg refill 90 day supply.

## 2015-02-13 NOTE — Telephone Encounter (Signed)
hosp f/u-fall, d/c 6/24. Pt scheduled with [facility] on 6/30.msn

## 2015-02-13 NOTE — Care Management (Addendum)
Patient has decided she wishes to discharge home instead of going to skilled nursing and patient's daughter [name] is in agreement.  Discussed the need for family members to monitor patient closely initially.  Discussed with attending that there is no need for home health aide as patient has medicaid CAP Services.  Adding physical therapy to current nursing services through [facility].  Family to transport home.  Notified [facility] and Home Care Providers

## 2015-02-13 NOTE — Discharge Summary (Signed)
[facility] - [place] at [facility]   PATIENT NAME: [name]    MR#:  [id number]  DATE OF BIRTH:  05-16-1929  DATE OF ADMISSION:  02/11/2015 ADMITTING PHYSICIAN: Oralia Manis, MD  DATE OF DISCHARGE: 02/13/2015   PRIMARY CARE PHYSICIAN: Wynona Dove, MD    ADMISSION DIAGNOSIS:  fall  DISCHARGE DIAGNOSIS:  Principal Problem:   Frequent falls Active Problems:   Hypothyroidism   Diabetes type 2, controlled   Essential hypertension   FAILURE, DIASTOLIC HEART, CHRONIC   GERD   Open abdominal wall wound   Coronary artery disease   CVA (cerebral infarction)   CKD (chronic kidney disease), stage III   Anxiety   H/O resection of pancreas   SECONDARY DIAGNOSIS:   Past Medical History  Diagnosis Date  . Anxiety   . Chronic diastolic CHF (congestive heart failure)   . Depression   . Diabetes mellitus     Followed by Dr. Tedd Sias at [facility]  . GERD (gastroesophageal reflux disease)   . Hypertension   . Hypothyroidism   . Osteoporosis   . Allergy   . Hyperlipidemia   . RLS (restless legs syndrome)   . Pancreatic disease     pancreatic failure  . Urinary incontinence   . Pancreatitis     Pancreatic resection/ open wound- [facility] 11/04  . Pneumonia     Pneumonia/ Emphysema 05/05  . 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 TIA (transient ischemic attack)     recurrent 12/15  . History of pleural empyema   . Sepsis   . Frequent falls   . Arthritis     left knee, [facility]  . CAD (coronary artery disease), native coronary artery      1//15  T mid-LAD  stent OM2  . Parkinson's disease     HOSPITAL COURSE:  This is an 81 year old female with past medical history significant for type 2 diabetes, hypothyroidism and previous TIA who presented to the hospital due to recurrent falls. For further details please refer the H&P.  1. Recurrent falls: Patient underwent an MRI which was negative for  acute stroke. Patient was seen and evaluated by neurology. Neurology ecommended to increase her dose of Sinemet given her history of Parkinson's disease. Patient was evaluated by physical therapy who recommended skilled nursing facility. Patient refuses skilled nursing facility. Patient should be discharged home with home health care.  2. Type 2 diabetes: Patient was seen and evaluated by diabetes coordinator. Patient will continue her outpatient medications including Lantus. Her blood sugars were stable in the hospital.  3. Hyperthyroidism: Patient will continue Synthroid 4. Restless leg syndrome: Patient will continue on Requip 5Hyperlipidemia: Patient will continue Pravachol 6. GERD: Patient continue on Protonix 7. Chronic history of chronic pancreatitis: Patient will continue Creon. 8.  Mitral fibrillation: Patient's heart rate is well-controlled. Patient is on Eliquis. However due to ongoing falls we have discontinued Eliquis. This was discussed with the patient and she accepts the risks of possible stroke versus intracranial hemorrhage with Eliquis. Patient is followed by Dr. Kirke Corin and can follow up with him in one week.  DISCHARGE CONDITIONS AND DIET:  Patient will be discharged home with home health care. Patient will resume her heart healthy diabetic diet.  CONSULTS OBTAINED:  Treatment Team:  Mellody Drown, MD  DRUG ALLERGIES:   Allergies  Allergen Reactions  . Citalopram Hydrobromide     REACTION: nausea, dizzy, dry  mouth  . Diphenhydramine     Other reaction(s): Unknown  . Diphenhydramine Hcl     REACTION: unspecified  . Metoclopramide     Other reaction(s): Unknown  . Metoclopramide Hcl     REACTION: unspecified  . Paroxetine     REACTION: hallucinations  . Tape Other (See Comments) and Rash    DISCHARGE MEDICATIONS:   Current Discharge Medication List    START taking these medications   Details  metoprolol (LOPRESSOR) 100 MG tablet Take 1 tablet (100 mg  total) by mouth at bedtime. Qty: 30 tablet, Refills: 0      CONTINUE these medications which have CHANGED   Details  apixaban (ELIQUIS) 2.5 MG TABS tablet Take 1 tablet (2.5 mg total) by mouth 2 (two) times daily. Qty: 180 tablet, Refills: 3   Associated Diagnoses: SOB (shortness of breath); Rapid heart beat    Carbidopa-Levodopa ER (SINEMET CR) 25-100 MG tablet controlled release Take 2 tablets by mouth 2 (two) times daily. Qty: 60 tablet, Refills: 0    lisinopril (PRINIVIL,ZESTRIL) 20 MG tablet TAKE ONE TABLET TWICE DAILY Qty: 180 tablet, Refills: 1    rOPINIRole (REQUIP) 0.5 MG tablet Take 1 tablet (0.5 mg total) by mouth at bedtime. Qty: 30 tablet, Refills: 0      CONTINUE these medications which have NOT CHANGED   Details  ALPRAZolam (XANAX) 1 MG tablet TAKE 1/2 TABLET IN THE MORNING TAKE 1/2 TABLET AT LUNCH AND TAKE 1 TABLET AT BEDTIME IF NEEDED Qty: 60 tablet, Refills: 5    aspirin 81 MG tablet Take 81 mg by mouth daily.      busPIRone (BUSPAR) 10 MG tablet TAKE ONE (1) TABLET BY MOUTH TWO (2) TIMES DAILY Qty: 180 tablet, Refills: 0    cetirizine (ZYRTEC) 10 MG tablet Take 10 mg by mouth daily.    fluticasone (FLONASE) 50 MCG/ACT nasal spray USE 2 SPRAYS EACH NOSTRIL DAILY Qty: 16 g, Refills: 3    furosemide (LASIX) 40 MG tablet Take 1 tablet (40 mg total) by mouth daily. Qty: 90 tablet, Refills: 3    gabapentin (NEURONTIN) 100 MG capsule Take 1 capsule (100 mg total) by mouth 3 (three) times daily. Qty: 90 capsule, Refills: 3   Associated Diagnoses: Pain of lower extremity, unspecified laterality    gentamicin ointment (GARAMYCIN) 0.1 % Apply 1 application topically 3 (three) times daily. Qty: 30 g, Refills: 0   Associated Diagnoses: Open wound of right great toe    HYDROcodone-acetaminophen (NORCO/VICODIN) 5-325 MG per tablet Take 1-2 tablets by mouth 3 (three) times daily as needed for moderate pain. Qty: 120 tablet, Refills: 0   Associated Diagnoses:  Osteoarthrosis, unspecified whether generalized or localized, involving lower leg    insulin glargine (LANTUS) 100 UNIT/ML injection Inject 0.55 mLs (55 Units total) into the skin at bedtime. Qty: 10 mL, Refills: 6    insulin lispro (HUMALOG) 100 UNIT/ML injection Inject 0.08-0.18 mLs (8-18 Units total) into the skin 3 (three) times daily before meals. Qty: 10 mL, Refills: 6    levothyroxine (SYNTHROID, LEVOTHROID) 75 MCG tablet TAKE ONE (1) TABLET EACH DAY Qty: 90 tablet, Refills: 1    Multiple Vitamins-Minerals (CENTRUM SILVER PO) Take 1 tablet by mouth daily.      omeprazole (PRILOSEC) 20 MG capsule TAKE ONE CAPSULE BY MOUTH DAILY Qty: 30 capsule, Refills: 6    ondansetron (ZOFRAN) 4 MG tablet Take 1 tablet (4 mg total) by mouth every 8 (eight) hours as needed. Qty: 90 tablet, Refills: 11  pravastatin (PRAVACHOL) 40 MG tablet Take 1 tablet (40 mg total) by mouth daily. Qty: 30 tablet, Refills: 5    PROAIR HFA 108 (90 BASE) MCG/ACT inhaler INHALE TWO PUFFS EVERY 6 HOURS AS NEEDEDFOR WHEEZE OR SHORTNESS OF BREATH Qty: 8.5 g, Refills: 3    triamcinolone cream (KENALOG) 0.1 % Apply topically 2 (two) times daily. Qty: 30 g, Refills: 0   Associated Diagnoses: Dermatitis    ULTICARE INSULIN SYRINGE 31G X 5/16" 0.5 ML MISC AS DIRECTED Qty: 100 each, Refills: 6    ZENPEP 15000 UNITS CPEP TAKE ONE (1) CAPSULE THREE (3) TIMES EACH DAY Qty: 100 capsule, Refills: 6      STOP taking these medications     amLODipine (NORVASC) 5 MG tablet      metoprolol (TOPROL-XL) 200 MG 24 hr tablet               Today   CHIEF COMPLAINT:  Patient is doing well this morning. Patient does not want to go to skilled nursing facility. Patient has no complaints.   VITAL SIGNS:  Blood pressure 129/77, pulse 89, temperature 98.2 F (36.8 C), temperature source Oral, resp. rate 18, height  (1.499 m), weight 80.196 kg (176 lb 12.8 oz), SpO2 100 %.   REVIEW OF SYSTEMS:  Review of  Systems  Constitutional: Negative for fever, chills and malaise/fatigue.  HENT: Negative for sore throat.   Eyes: Negative for blurred vision.  Respiratory: Negative for cough, hemoptysis, shortness of breath and wheezing.   Cardiovascular: Negative for chest pain, palpitations and leg swelling.  Gastrointestinal: Negative for nausea, vomiting, abdominal pain, diarrhea and blood in stool.  Genitourinary: Negative for dysuria.  Musculoskeletal: Negative for back pain.  Neurological: Negative for dizziness, tremors and headaches.  Endo/Heme/Allergies: Does not bruise/bleed easily.     PHYSICAL EXAMINATION:  GENERAL:  81 y.o.-year-old patient lying in the bed with no acute distress.  NECK:  Supple, no jugular venous distention. No thyroid enlargement, no tenderness.  LUNGS: Normal breath sounds bilaterally, no wheezing, rales,rhonchi  No use of accessory muscles of respiration.  CARDIOVASCULAR: S1, S2 normal. No murmurs, rubs, or gallops.  ABDOMEN: Soft, non-tender, non-distended. Bowel sounds present. No organomegaly or mass.  EXTREMITIES: minimal pedal edema, cyanosis, or clubbing.  PSYCHIATRIC: The patient is alert and oriented x 3.  SKIN: No obvious rash, lesion, or ulcer.   DATA REVIEW:   CBC  Recent Labs Lab 02/13/15 0353  WBC 5.2  HGB 9.8*  HCT 30.3*  PLT 86*    Chemistries   Recent Labs Lab 02/12/15 0044 02/12/15 0608 02/13/15 0353  NA 142 144 141  K 4.6 5.1 4.8  CL 107 109 108  CO2 GLUCOSE 161* 231* 247*  BUN 30* 28* 20  CREATININE 1.45* 1.29* 1.11*  CALCIUM 8.6* 8.2* 8.6*  MG  --  2.0  --   AST 25  --   --   ALT 7*  --   --   ALKPHOS 72  --   --   BILITOT 0.2*  --   --     Cardiac Enzymes  Recent Labs Lab 02/12/15 0608 02/12/15 1217 02/12/15 1633  TROPONINI <0.03 <0.03 <0.03    Microbiology Results  @  RADIOLOGY:  Ct Head Wo Contrast  02/12/2015   CLINICAL DATA:  Initial evaluation for acute right face/ upper  extremity weakness  EXAM: CT HEAD WITHOUT CONTRAST  TECHNIQUE: Contiguous axial images were obtained from the base of the  skull through the vertex without intravenous contrast.  COMPARISON:  Prior study from 07/30/2014  FINDINGS: Diffuse prominence of the CSF containing spaces is compatible with generalized cerebral atrophy. Chronic small vessel ischemic changes present within the periventricular and deep white matter both cerebral hemispheres. Scattered small remote infarcts present within the right frontoparietal region. Small remote right cerebellar infarcts also noted. Scattered vascular calcifications present within the carotid siphons and distal left vertebral artery.  No acute large vessel territory infarct identified. Gray-white matter differentiation is maintained. No acute intracranial hemorrhage.  No mass lesion, mass effect, or midline shift. No hydrocephalus. No extra-axial fluid collection.  Scalp soft tissues within normal limits. No acute abnormality about the orbits.  Calvarium intact.  Mild layering opacity present within the lateral recess of the left sphenoid sinus. Paranasal sinuses are otherwise largely clear. No mastoid effusion.  IMPRESSION: 1. No acute intracranial process identified. 2. Generalized cerebral atrophy with chronic microvascular ischemic disease and remote infarcts involving the right frontoparietal region and right cerebellar hemisphere.   Electronically Signed   By: Rise Mu M.D.   On: 02/12/2015 03:32   Mr Brain Wo Contrast  02/12/2015     IMPRESSION: No acute infarct.  Remote infarcts right periatrial/ right parietal region. Remote tiny cerebellar infarcts. Remote tiny right thalamic and basal ganglia infarcts.  Mild small vessel disease type changes.  No intracranial hemorrhage.  Global atrophy.  Ectatic vertebral arteries and basilar artery with atherosclerotic type changes of the vertebral arteries suspected.   Electronically Signed   By: Lacy Duverney  M.D.   On: 02/12/2015 13:54   Dg Esophagus  02/12/2015   ADDENDUM REPORT: 02/12/2015 14:43  ADDENDUM: I have been informed by speech pathologist that the correct history is that the patient is at risk for aspiration and does not have a history of documented aspiration.   Electronically Signed   By: Lacy Duverney M.D.   On: 02/12/2015 14:43   02/12/2015     IMPRESSION: Limited examination with poor esophageal motility as noted above. Patient may be safe for p.o. intake.  Electronically Signed: By: Lacy Duverney M.D. On: 02/12/2015 14:22   US Carotid Bilateral  02/12/2015   C IMPRESSION: 1. Suspected progression of now moderate amount of left-sided atherosclerotic plaque though again not resulting in a hemodynamically significant stenosis. 2. Grossly unchanged minimal amount of right-sided atherosclerotic plaque, not resulting in a hemodynamically significant stenosis.   Electronically Signed   By: Simonne Come M.D.   On: 02/12/2015 15:40      Management plans discussed with the patient and she is in agreement. Stable for discharge home with home health care  Patient should follow up with PCP in one week  CODE STATUS:     Code Status Orders        Start     Ordered   02/12/15 0426  Do not attempt resuscitation (DNR)   Continuous    Question Answer Comment  In the event of cardiac or respiratory ARREST Do not call a "code blue"   In the event of cardiac or respiratory ARREST Do not perform Intubation, CPR, defibrillation or ACLS   In the event of cardiac or respiratory ARREST Use medication by any route, position, wound care, and other measures to relive pain and suffering. May use oxygen, suction and manual treatment of airway obstruction as needed for comfort.      02/12/15 0425    Advance Directive Documentation  Most Recent Value   Type of Advance Directive  Living will   Pre-existing out of facility DNR order (yellow form or pink MOST form)     "MOST" Form in Place?         TOTAL TIME TAKING CARE OF THIS PATIENT: 35 minutes.    Panfilo Ketchum M.D on 02/13/2015 at 11:58 AM  Between 7am to 6pm - Pager - [phone] After 6pm go to [email or web address] - password [email or web address] [facility]  [facility]  Office  [phone]  CC: Primary care physician; Wynona Dove, MD

## 2015-02-13 NOTE — Telephone Encounter (Signed)
Pt daughter called, states they are awaiting an ins approval of Eliquis, and needs either the approval, or samples. Please call.

## 2015-02-13 NOTE — Clinical Social Work Note (Signed)
Clinical Social Work Assessment  Patient Details  Name: [name] MRN: [id number] Date of Birth: 27-Apr-1929  Date of referral:  02/13/15               Reason for consult:  Facility Placement                Permission sought to share information with:  Family Supports Permission granted to share information::  Yes, Verbal Permission Granted  Name::        Agency::     Relationship::     Contact Information:     Housing/Transportation Living arrangements for the past 2 months:  Single Family Home Source of Information:  Patient Patient Interpreter Needed:  None Criminal Activity/Legal Involvement Pertinent to Current Situation/Hospitalization:  No - Comment as needed Significant Relationships:  Adult Children Lives with:  Self Do you feel safe going back to the place where you live?  Yes Need for family participation in patient care:  Yes (Comment)  Care giving concerns:  Pt did not want to get placed in a rehab facility.     Social Worker assessment / plan:  CSW spoke with pt.  She was sitting in recliner in the room.  She was alert and Ox3.  She stated that she was not interested in going to a SNF for rehab.  She stated that she felt much better and would like to go home.  "I would rather die than go to a rehab facility again".  Per pt she has 7 children that live close by and PCS that also comes to her home M-F.  CSW notified RNCM that pt would need home health.   Employment status:  Disabled (Comment on whether or not currently receiving Disability), Retired Health and safety inspector:  Medicare PT Recommendations:  Skilled Nursing Facility Information / Referral to community resources:     Patient/Family's Response to care:  Pt declines SNF placement  Patient/Family's Understanding of and Emotional Response to Diagnosis, Current Treatment, and Prognosis:  Pt understands the recommendation of SNF placement, but would rather go home.  She understands that she will need  support. Emotional Assessment Appearance:  Appears stated age Attitude/Demeanor/Rapport:   (Pleasent ) Affect (typically observed):   (normal ) Orientation:  Oriented to Self, Oriented to Place, Oriented to  Time, Oriented to Situation Alcohol / Substance use:  Never Used Psych involvement (Current and /or in the community):  No (Comment)  Discharge Needs  Concerns to be addressed:  Care Coordination Readmission within the last 30 days:  No Current discharge risk:  Physical Impairment, Lack of support system Barriers to Discharge:      Chauncy Passy, LCSW 02/13/2015, 11:14 AM

## 2015-02-13 NOTE — Telephone Encounter (Signed)
Informed Dr [name] that pt has an appt with Naomie Dean on 6.30.16.

## 2015-02-13 NOTE — Evaluation (Signed)
Occupational Therapy Evaluation Patient Details Name: [name] MRN: [id number] DOB: 1929-01-23 Today's Date: 02/13/2015    History of Present Illness 81 yo female with onset of GI symptoms and scheduled endoscopy,  recent falls and  R side weakness, L facial numbness, now referred for OT   Clinical Impression   Patient is a 81 yo female who had a recent fall at home and was admitted to the hospital to rule out CVA.  Patient lives at home alone, she has 7 children who live locally, with one of her children next door. She has an aide who comes 4 hours a day for 5 days a week to assist with bathing, dressing and homemaking tasks.  Her children take turns helping her in the evenings and weekends.  She presents with decreased mobility, decreased ability to perform self care tasks, muscle weakness and mild coordination deficits.  She would benefit from continued OT to maximize her safety and independence in daily tasks.  She would likely benefit from skilled nursing for short term rehab but reports she refuses to go to skilled and wants to go home with family assist.      Follow Up Recommendations  Home health OT (pt declines SNF and wants to return home with family support)    Equipment Recommendations  Other (comment) (continue to assess needs)    Recommendations for Other Services       Precautions / Restrictions Precautions Precautions: Fall Restrictions Weight Bearing Restrictions: No      Mobility Bed Mobility Overal bed mobility: Needs Assistance Bed Mobility: Supine to Sit;Sit to Supine     Supine to sit: Min guard Sit to supine: Min assist   General bed mobility comments: HOB elevated and bedrails used  Transfers   Equipment used: Rolling walker (2 wheeled) Transfers: Sit to/from Stand Sit to Stand: Min guard Stand pivot transfers: Min guard            Balance                                            ADL Overall ADL's : Needs  assistance/impaired Eating/Feeding: Modified independent   Grooming: Modified independent   Upper Body Bathing: Minimal assitance   Lower Body Bathing: Minimal assistance   Upper Body Dressing : Modified independent   Lower Body Dressing: Minimal assistance   Toilet Transfer: Min guard   Toileting- Clothing Manipulation and Hygiene: Min guard   Tub/ Engineer, structural: Walk-in shower (patient takes sponge baths at home)   Functional mobility during ADLs: Min guard General ADL Comments: Patient has an aide who comes in 4 hours a day, 5 days a week  to help with bathing, dressing and homemaking skills.  Her family comes in each evening and on weekends.     Vision     Perception     Praxis      Pertinent Vitals/Pain Pain Assessment: No/denies pain     Hand Dominance Right   Extremity/Trunk Assessment Upper Extremity Assessment Upper Extremity Assessment: Generalized weakness (Pt has a previous RTC tear on the left.)   Lower Extremity Assessment Lower Extremity Assessment: Defer to PT evaluation   Cervical / Trunk Assessment Cervical / Trunk Assessment: Kyphotic   Communication Communication Communication: No difficulties   Cognition Arousal/Alertness: Awake/alert Behavior During Therapy: WFL for tasks assessed/performed Overall Cognitive Status: Within Functional Limits for tasks  assessed                     General Comments       Exercises       Shoulder Instructions      Home Living Family/patient expects to be discharged to:: Private residence Living Arrangements: Alone Available Help at Discharge: Family;Friend(s) Type of Home: House Home Access: Ramped entrance     Home Layout: Two level;Able to live on main level with bedroom/bathroom Alternate Level Stairs-Number of Steps: 13   Bathroom Shower/Tub: Producer, television/film/video: Handicapped height Bathroom Accessibility: Yes How Accessible: Accessible via walker Home Equipment:  Walker - 4 wheels;Shower seat;Bedside commode          Prior Functioning/Environment Level of Independence: Needs assistance    ADL's / Homemaking Assistance Needed: Patient has an aide who comes in 4 hours a day, 5 days a week to assist with cleaning and self care tasks.  Patient requires minimal assist for bathing and dressing at home.        OT Diagnosis: Generalized weakness;Other (comment) (mild coordination deficits on left UE)   OT Problem List: Decreased strength;Decreased range of motion;Decreased coordination;Decreased knowledge of use of DME or AE;Impaired UE functional use   OT Treatment/Interventions: Self-care/ADL training;Therapeutic exercise;Patient/family education;Neuromuscular education;DME and/or AE instruction    OT Goals(Current goals can be found in the care plan section) Acute Rehab OT Goals Patient Stated Goal: Patient wants to return home and do as much as she can for herself. OT Goal Formulation: With patient Time For Goal Achievement: 02/20/15 Potential to Achieve Goals: Good  OT Frequency: Min 1X/week   Barriers to D/C:            Co-evaluation              End of Session Equipment Utilized During Treatment: Gait belt;Rolling walker  Activity Tolerance: Patient tolerated treatment well Patient left: in bed;with call bell/phone within reach;with bed alarm set   Time: 1026-1055 OT Time Calculation (min): 29 min Charges:  OT General Charges $OT Visit: 1 Procedure OT Evaluation $Initial OT Evaluation Tier I: 1 Procedure G-Codes:    [name] 03-05-15, 10:59 AM

## 2015-02-13 NOTE — Telephone Encounter (Signed)
LMTCB regarding Eliquis request.  Pt's number disconnected.

## 2015-02-13 NOTE — Progress Notes (Signed)
Patient is discharge in a stable condition with no deficit , summary and f/u care given to pt's verbalized understanding . Left with son

## 2015-02-16 NOTE — Telephone Encounter (Addendum)
Discharge Date: 02/13/15  Transition Care Management Follow-up Telephone Call  How have you been since you were released from the hospital? States she is weak, having rib pain from previous fall. Taking Vicodin and that helps with pain. Using a walker to get around. No more falls since discharge.    Do you understand why you were in the hospital? YES, frequent falls   Do you understand the discharge instructions? YES, see med change below.  Items Reviewed:  Medications reviewed: YES, discontinue Eliquis. Pt states her daughter sets up her pills for the week, unsure what Eliquis looks like. Advised to speak to her daughter to confirm that she doesn't have Eliquis in her pills boxes,  verbalized understanding   Allergies reviewed: YES, no new drug allergies  Dietary changes reviewed: YES, diabetic cardiac diet  Referrals reviewed: Home Health with [facility], nursing and PT   Functional Questionnaire:   Activities of Daily Living (ADLs):  She states they are independent in the following: ALL States they require assistance with the following: None   Any transportation issues/concerns?: NO, son will drive her to her appt   Any patient concerns? Not at this time. Advised to call back with any further problems or concerns,  verbalized understanding    Confirmed importance and date/time of follow-up visits scheduled: YES, confirmed appt with Lyla Son 02/19/15 @ 1:30   Confirmed with patient if condition begins to worsen call PCP or go to the ER. Patient was given the Call-a-Nurse line [phone]: YES

## 2015-02-16 NOTE — Telephone Encounter (Signed)
Line busy, will try back later. 

## 2015-02-16 NOTE — Telephone Encounter (Signed)
TCM call completed, see below.  

## 2015-02-17 LAB — CUP PACEART REMOTE DEVICE CHECK: Date Time Interrogation Session: 20160628171113

## 2015-02-18 ENCOUNTER — Encounter: Payer: Self-pay | Admitting: Nurse Practitioner

## 2015-02-18 ENCOUNTER — Encounter: Payer: Self-pay | Admitting: Internal Medicine

## 2015-02-18 NOTE — Progress Notes (Signed)
Loop recorder 

## 2015-02-19 ENCOUNTER — Ambulatory Visit (INDEPENDENT_AMBULATORY_CARE_PROVIDER_SITE_OTHER): Payer: Medicare Other | Admitting: Nurse Practitioner

## 2015-02-19 VITALS — BP 104/64 | HR 65 | Temp 97.8°F | Resp 14 | Ht 63.0 in | Wt 179.4 lb

## 2015-02-19 DIAGNOSIS — R296 Repeated falls: Secondary | ICD-10-CM | POA: Diagnosis not present

## 2015-02-19 DIAGNOSIS — Z09 Encounter for follow-up examination after completed treatment for conditions other than malignant neoplasm: Secondary | ICD-10-CM

## 2015-02-19 DIAGNOSIS — R413 Other amnesia: Secondary | ICD-10-CM | POA: Diagnosis not present

## 2015-02-19 DIAGNOSIS — I638 Other cerebral infarction: Secondary | ICD-10-CM

## 2015-02-19 DIAGNOSIS — I6389 Other cerebral infarction: Secondary | ICD-10-CM

## 2015-02-19 NOTE — Progress Notes (Addendum)
Subjective:    Patient ID: [name], female    DOB: 05-01-1929, 81 y.o.   MRN: [id number]  HPI  Ms. Isip is a 81 yo female with a [facility] hospital follow up. [facility] d/c on 02/11/15.   1) D/c Dx: Weak, multiple falls  CT head- No acute intracranial process identified, generalized cerebral atrophy w/ chronic microvascular ischemic disease and remote infarcts involving the right frontoparietal region and right cerebellar hemisphere.   Vicodin helps with pain, using walker  Diet- low sugar/salt  Home health w/ [facility]- nursing and PT ordered by hospital   Has not started PT, Nurse 3 x a week- checks BS, BP, changes dressing, Nurse Aide 4 hr daily to help with ADLs   Stopped Amlodipine 5 mg, Metoprolol 200 mg, Lisinopril   Review of Systems  Constitutional: Negative for fever, chills, diaphoresis and fatigue.  Respiratory: Negative for chest tightness, shortness of breath and wheezing.   Cardiovascular: Negative for chest pain, palpitations and leg swelling.  Gastrointestinal: Negative for nausea, vomiting and diarrhea.  Skin: Negative for rash.  Neurological: Negative for dizziness, weakness, numbness and headaches.  Psychiatric/Behavioral: The patient is not nervous/anxious.    Past Medical History  Diagnosis Date  . Anxiety   . Chronic diastolic CHF (congestive heart failure)   . Depression   . Diabetes mellitus     Followed by Dr. Tedd Sias at [facility]  . GERD (gastroesophageal reflux disease)   . Hypertension   . Hypothyroidism   . Osteoporosis   . Allergy   . Hyperlipidemia   . RLS (restless legs syndrome)   . Pancreatic disease     pancreatic failure  . Urinary incontinence   . Pancreatitis     Pancreatic resection/ open wound- [facility] 11/04  . Pneumonia     Pneumonia/ Emphysema 05/05  . 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 TIA (transient ischemic attack)     recurrent 12/15  . History of pleural empyema   . Sepsis    . Frequent falls   . Arthritis     left knee, [facility]  . CAD (coronary artery disease), native coronary artery      1//15  T mid-LAD  stent OM2  . Parkinson's disease     History   Social History  . Marital Status: Widowed    Spouse Name: N/A  . Number of Children: 7  . Years of Education: 6   Occupational History  . retired- Veterinary surgeon    Social History Main Topics  . Smoking status: Never Smoker   . Smokeless tobacco: Never Used  . Alcohol Use: No  . Drug Use: No  . Sexual Activity: Not on file   Other Topics Concern  . Not on file   Social History Narrative   Lives in home alone, has nursing aid, and alert device in [place]. Has 7 children.            Has [name], daughter, as health care POA   Not sure about DNR   Permission to speak to daughter-in-law, Cala Kruckenberg, regarding health care matters.    Past Surgical History  Procedure Laterality Date  . Breast lumpectomy      Lumpectomy right breast  . 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 Abdominal hysterectomy      Hysteroscopy/ D & C ([facility]) 12/05  . Removal of  pancreas  2004  . Kyphoplasty  2005  . Broken shoulder and orbital bone  2005  . Cholecystectomy  1973  . Thoracentesis    . Vaginal delivery      9  . Cardiac catheterization  08/29/2013    x1 stent @ [facility]  . Cataract surgery      Family History  Problem Relation Age of Onset  . Heart disease Mother   . Heart attack Mother   . Hypertension Mother   . Heart attack Father     Allergies  Allergen Reactions  . Citalopram Hydrobromide     REACTION: nausea, dizzy, dry mouth  . Diphenhydramine     Other reaction(s): Unknown  . Diphenhydramine Hcl     REACTION: unspecified  . Metoclopramide     Other reaction(s): Unknown  . Metoclopramide Hcl     REACTION: unspecified  . Paroxetine     REACTION: hallucinations  . Tape Other (See Comments) and Rash    Current Outpatient  Prescriptions on File Prior to Visit  Medication Sig Dispense Refill  . ALPRAZolam (XANAX) 1 MG tablet TAKE 1/2 TABLET IN THE MORNING TAKE 1/2 TABLET AT LUNCH AND TAKE 1 TABLET AT BEDTIME IF NEEDED 60 tablet 5  . busPIRone (BUSPAR) 10 MG tablet TAKE ONE (1) TABLET BY MOUTH TWO (2) TIMES DAILY 180 tablet 0  . Carbidopa-Levodopa ER (SINEMET CR) 25-100 MG tablet controlled release Take 2 tablets by mouth 2 (two) times daily. 60 tablet 0  . cetirizine (ZYRTEC) 10 MG tablet Take 10 mg by mouth daily.    . fluticasone (FLONASE) 50 MCG/ACT nasal spray USE 2 SPRAYS EACH NOSTRIL DAILY 16 g 3  . furosemide (LASIX) 40 MG tablet Take 1 tablet (40 mg total) by mouth daily. 90 tablet 3  . gentamicin ointment (GARAMYCIN) 0.1 % Apply 1 application topically 3 (three) times daily. 30 g 0  . HYDROcodone-acetaminophen (NORCO/VICODIN) 5-325 MG per tablet Take 1-2 tablets by mouth 3 (three) times daily as needed for moderate pain. 120 tablet 0  . insulin glargine (LANTUS) 100 UNIT/ML injection Inject 0.55 mLs (55 Units total) into the skin at bedtime. 10 mL 6  . insulin lispro (HUMALOG) 100 UNIT/ML injection Inject 0.08-0.18 mLs (8-18 Units total) into the skin 3 (three) times daily before meals. 10 mL 6  . levothyroxine (SYNTHROID, LEVOTHROID) 75 MCG tablet TAKE ONE (1) TABLET EACH DAY 90 tablet 1  . metoprolol (LOPRESSOR) 100 MG tablet Take 1 tablet (100 mg total) by mouth at bedtime. 30 tablet 0  . Multiple Vitamins-Minerals (CENTRUM SILVER PO) Take 1 tablet by mouth daily.      Marland Kitchen omeprazole (PRILOSEC) 20 MG capsule TAKE ONE CAPSULE BY MOUTH DAILY 30 capsule 6  . ondansetron (ZOFRAN) 4 MG tablet Take 1 tablet (4 mg total) by mouth every 8 (eight) hours as needed. 90 tablet 11  . pravastatin (PRAVACHOL) 40 MG tablet Take 1 tablet (40 mg total) by mouth daily. 30 tablet 5  . PROAIR HFA 108 (90 BASE) MCG/ACT inhaler INHALE TWO PUFFS EVERY 6 HOURS AS NEEDEDFOR WHEEZE OR SHORTNESS OF BREATH 8.5 g 3  . rOPINIRole  (REQUIP) 0.5 MG tablet Take 1 tablet (0.5 mg total) by mouth at bedtime. 30 tablet 0  . triamcinolone cream (KENALOG) 0.1 % Apply topically 2 (two) times daily. 30 g 0  . ULTICARE INSULIN SYRINGE 31G X 5/16" 0.5 ML MISC AS DIRECTED 100 each 6  . ZENPEP 15000 UNITS CPEP TAKE ONE (1) CAPSULE THREE (  3) TIMES EACH DAY 100 capsule 6   No current facility-administered medications on file prior to visit.       Objective:   Physical Exam  Constitutional: She appears well-developed and well-nourished. No distress.  BP 104/64 mmHg  Pulse 65  Temp(Src) 97.8 F (36.6 C)  Resp 14  Ht  (1.6 m)  Wt 179 lb 6.4 oz (81.375 kg)  BMI 31.79 kg/m2  SpO2 97%   HENT:  Head: Normocephalic and atraumatic.  Right Ear: External ear normal.  Left Ear: External ear normal.  Eyes: Right eye exhibits no discharge. Left eye exhibits no discharge. No scleral icterus.  Cardiovascular: Normal rate and regular rhythm.   Pulmonary/Chest: Effort normal and breath sounds normal. No respiratory distress. She has no wheezes. She has no rales. She exhibits no tenderness.  Musculoskeletal: She exhibits no edema or tenderness.  Neurological: She is alert. No cranial nerve deficit. She exhibits normal muscle tone. Coordination abnormal.  Ambulating well with walker  Skin: Skin is warm and dry. No rash noted. She is not diaphoretic.  Psychiatric: She has a normal mood and affect. Her behavior is normal.      Assessment & Plan:

## 2015-02-19 NOTE — Progress Notes (Signed)
Pre visit review using our clinic review tool, if applicable. No additional management support is needed unless otherwise documented below in the visit note. 

## 2015-02-19 NOTE — Patient Instructions (Addendum)
We will contact Dr. Jari SportsmanArida's office and see what we should do.   Continue to stop the Lisinopril, Amlodipine, Metoprolol, and Gabapentin.

## 2015-02-20 ENCOUNTER — Telehealth: Payer: Self-pay

## 2015-02-20 ENCOUNTER — Encounter: Payer: Medicare Other | Attending: Surgery | Admitting: Surgery

## 2015-02-20 DIAGNOSIS — I259 Chronic ischemic heart disease, unspecified: Secondary | ICD-10-CM | POA: Diagnosis not present

## 2015-02-20 DIAGNOSIS — X58XXXS Exposure to other specified factors, sequela: Secondary | ICD-10-CM | POA: Insufficient documentation

## 2015-02-20 DIAGNOSIS — S31102S Unspecified open wound of abdominal wall, epigastric region without penetration into peritoneal cavity, sequela: Secondary | ICD-10-CM | POA: Insufficient documentation

## 2015-02-20 DIAGNOSIS — E11622 Type 2 diabetes mellitus with other skin ulcer: Secondary | ICD-10-CM | POA: Insufficient documentation

## 2015-02-20 DIAGNOSIS — I10 Essential (primary) hypertension: Secondary | ICD-10-CM | POA: Diagnosis not present

## 2015-02-20 DIAGNOSIS — I5020 Unspecified systolic (congestive) heart failure: Secondary | ICD-10-CM | POA: Diagnosis not present

## 2015-02-20 DIAGNOSIS — E669 Obesity, unspecified: Secondary | ICD-10-CM | POA: Insufficient documentation

## 2015-02-20 NOTE — Telephone Encounter (Signed)
-----   Message from Marchia MeiersJulie M Eastwood, CMA sent at 02/19/2015  3:12 PM EDT ----- Satira AnisHi Marina,  Can you help me with the below questions on a mutual patient. She is Dr Tilman NeatWalker's pt, was last seen by Dr Graciela HusbandsKlein, she was seen today by our NP, Naomie Deanarrie Doss for a hospital follow up.    ~I see your phone note stating that the PA was approved for the Eliquis but I just got off the phone with the pharmacy who says it has not been approved.  ~She was supposed to have a follow up appt with your office 1 wk after d/c, but was scheduled to be seen at the end of July, possibly an old appt that was scheduled before the hospital admission, Can we please get a sooner appt for her a hospital follow up.    Thank you!  Raynelle FanningJulie    ----- Message -----    From: Carollee Leitzarrie M Doss, NP    Sent: 02/19/2015   2:06 PM      To: Marchia MeiersJulie M Eastwood, CMA  Please call Dr. Jari SportsmanArida's office and see about  #1) She was supposed to follow up within 1 week from discharge from the hospital- can she get an appointment. D/c date 02/11/15   #2) The pharmacist still swears the PA on Eliquis has not gone through and that is has "something to do with Medicare Part D" and [facility]   #3) Her BP is still on the lower end of normal without any blood pressure medications. Just want them aware. She is feeling improved.   Thanks!

## 2015-02-20 NOTE — Telephone Encounter (Signed)
Jearld AdjutantMarina can you please verify if you do have information on this patient for the approval on eliquis.

## 2015-02-20 NOTE — Telephone Encounter (Signed)
l mom to inform pt of her sooner appt, that was moved up to 7/11

## 2015-02-20 NOTE — Telephone Encounter (Signed)
FYI

## 2015-02-20 NOTE — Telephone Encounter (Signed)
I'm not sure about the approval for Eliquis since Jearld AdjutantMarina is not here today and she will return on Tuesday. We can get samples for her until this insurance approval is taking care of. We have moved her appointment up to March 02, 2015 at 2:30  with Dr. Kirke CorinArida. We will contact the patient regarding samples of eliquis and the update of her appointment. If you have any further questions please do not hesitate to call or send me a message.

## 2015-02-24 ENCOUNTER — Other Ambulatory Visit: Payer: Self-pay | Admitting: *Deleted

## 2015-02-24 ENCOUNTER — Encounter: Payer: Self-pay | Admitting: *Deleted

## 2015-02-24 MED ORDER — APIXABAN 2.5 MG PO TABS: 2.5000 mg | ORAL_TABLET | Freq: Two times a day (BID) | ORAL | Status: DC

## 2015-02-24 NOTE — Progress Notes (Addendum)
TIARAH, SHISLER ([id number]) Visit Report for 02/20/2015 Arrival Information Details Patient Name: [name], ZAMOR. Date of Service: 02/20/2015 1:00 PM Medical Record Number: [id number] Patient Account Number: [id number] Date of Birth/Sex: [birth year] (81 y.o. Female) Treating RN: Huel Coventry Primary Care Physician: Ronna Polio Other Clinician: Referring Physician: Ronna Polio Treating Physician/Extender: Rudene Re in Treatment: 30 Visit Information History Since Last Visit Added or deleted any medications: No Patient Arrived: Walker Any new allergies or adverse reactions: No Arrival Time: 12:59 Had a fall or experienced change in No Accompanied By: son activities of daily living that may affect Transfer Assistance: None risk of falls: Patient Identification Verified: Yes Signs or symptoms of abuse/neglect since last No Secondary Verification Process Yes visito Completed: Hospitalized since last visit: No Patient Requires Transmission- No Has Dressing in Place as Prescribed: Yes Based Precautions: Pain Present Now: No Patient Has Alerts: Yes Patient Alerts: Patient on Blood Thinner Electronic Signature(s) Signed: 02/20/2015 4:20:29 PM By: Elliot Gurney, RN, BSN, Kim RN, BSN Entered By: Elliot Gurney, RN, BSN, Kim on 02/20/2015 13:00:15 [name] ([id number]) -------------------------------------------------------------------------------- Clinic Level of Care Assessment Details Patient Name: [name]. Date of Service: 02/20/2015 1:00 PM Medical Record Number: [id number] Patient Account Number: [id number] Date of Birth/Sex: 1929-01-27 (81 y.o. Female) Treating RN: Huel Coventry Primary Care Physician: Ronna Polio Other Clinician: Referring Physician: Ronna Polio Treating Physician/Extender: Rudene Re in Treatment: 67 Clinic Level of Care Assessment Items TOOL 4 Quantity Score []  - Use when only an EandM is performed on FOLLOW-UP visit 0 ASSESSMENTS  - Nursing Assessment / Reassessment []  - Reassessment of Co-morbidities (includes updates in patient status) 0 X - Reassessment of Adherence to Treatment Plan 1 5 ASSESSMENTS - Wound and Skin Assessment / Reassessment X - Simple Wound Assessment / Reassessment - one wound 1 5 []  - Complex Wound Assessment / Reassessment - multiple wounds 0 []  - Dermatologic / Skin Assessment (not related to wound area) 0 ASSESSMENTS - Focused Assessment []  - Circumferential Edema Measurements - multi extremities 0 []  - Nutritional Assessment / Counseling / Intervention 0 []  - Lower Extremity Assessment (monofilament, tuning fork, pulses) 0 []  - Peripheral Arterial Disease Assessment (using hand held doppler) 0 ASSESSMENTS - Ostomy and/or Continence Assessment and Care []  - Incontinence Assessment and Management 0 []  - Ostomy Care Assessment and Management (repouching, etc.) 0 PROCESS - Coordination of Care X - Simple Patient / Family Education for ongoing care 1 15 []  - Complex (extensive) Patient / Family Education for ongoing care 0 []  - Staff obtains Chiropractor, Records, Test Results / Process Orders 0 []  - Staff telephones HHA, Nursing Homes / Clarify orders / etc 0 []  - Routine Transfer to another Facility (non-emergent condition) 0 [name], [name] ([id number]) []  - Routine Hospital Admission (non-emergent condition) 0 []  - New Admissions / Manufacturing engineer / Ordering NPWT, Apligraf, etc. 0 []  - Emergency Hospital Admission (emergent condition) 0 X - Simple Discharge Coordination 1 10 []  - Complex (extensive) Discharge Coordination 0 PROCESS - Special Needs []  - Pediatric / Minor Patient Management 0 []  - Isolation Patient Management 0 []  - Hearing / Language / Visual special needs 0 []  - Assessment of Community assistance (transportation, D/C planning, etc.) 0 []  - Additional assistance / Altered mentation 0 []  - Support Surface(s) Assessment (bed, cushion, seat, etc.) 0 INTERVENTIONS -  Wound Cleansing / Measurement X - Simple Wound Cleansing - one wound 1 5 []  - Complex Wound Cleansing - multiple wounds 0 X - Wound  Imaging (photographs - any number of wounds) 1 5  - Wound Tracing (instead of photographs) 0 X - Simple Wound Measurement - one wound 1 5  - Complex Wound Measurement - multiple wounds 0 INTERVENTIONS - Wound Dressings X - Small Wound Dressing one or multiple wounds 1 10  - Medium Wound Dressing one or multiple wounds 0  - Large Wound Dressing one or multiple wounds 0  - Application of Medications - topical 0  - Application of Medications - injection 0 INTERVENTIONS - Miscellaneous  - External ear exam 0 [name], [name] ([id number])  - Specimen Collection (cultures, biopsies, blood, body fluids, etc.) 0  - Specimen(s) / Culture(s) sent or taken to Lab for analysis 0  - Patient Transfer (multiple staff / Michiel Sites Lift / Similar devices) 0  - Simple Staple / Suture removal (25 or less) 0  - Complex Staple / Suture removal (26 or more) 0  - Hypo / Hyperglycemic Management (close monitor of Blood Glucose) 0  - Ankle / Brachial Index (ABI) - do not check if billed separately 0 X - Vital Signs 1 5 Has the patient been seen at the hospital within the last three years: Yes Total Score: 65 Level Of Care: New/Established - Level 2 Electronic Signature(s) Signed: 02/20/2015 4:20:29 PM By: Elliot Gurney, RN, BSN, Kim RN, BSN Entered By: Elliot Gurney, RN, BSN, Kim on 02/20/2015 13:23:26 [name] ([id number]) -------------------------------------------------------------------------------- Encounter Discharge Information Details Patient Name: [name] Date of Service: 02/20/2015 1:00 PM Medical Record Number: [id number] Patient Account Number: [id number] Date of Birth/Sex: 02/21/1929 (81 y.o. Female) Treating RN: Huel Coventry Primary Care Physician: Ronna Polio Other Clinician: Referring Physician: Ronna Polio Treating  Physician/Extender: Rudene Re in Treatment: 64 Encounter Discharge Information Items Discharge Pain Level: 0 Discharge Condition: Stable Ambulatory Status: Ambulatory Discharge Destination: Home Private Transportation: Auto Accompanied By: self Schedule Follow-up Appointment: Yes Medication Reconciliation completed and Yes provided to Patient/Care Kaili Castille: Clinical Summary of Care: Electronic Signature(s) Signed: 02/20/2015 4:20:29 PM By: Elliot Gurney, RN, BSN, Kim RN, BSN Entered By: Elliot Gurney, RN, BSN, Kim on 02/20/2015 13:23:51 [name], [name] ([id number]) -------------------------------------------------------------------------------- Multi Wound Chart Details Patient Name: [name] Date of Service: 02/20/2015 1:00 PM Medical Record Number: [id number] Patient Account Number: [id number] Date of Birth/Sex: October 30, 1928 (81 y.o. Female) Treating RN: Huel Coventry Primary Care Physician: Ronna Polio Other Clinician: Referring Physician: Ronna Polio Treating Physician/Extender: Rudene Re in Treatment: 77 Vital Signs Height(in): 59 Pulse(bpm): 64 Weight(lbs): 181 Blood Pressure 98/68 (mmHg): Body Mass Index(BMI): 37 Temperature(F): 97.7 Respiratory Rate 18 (breaths/min): Photos: [2:No Photos] [N/A:N/A] Wound Location: [2:Abdomen - midline] [N/A:N/A] Wounding Event: [2:Surgical Injury] [N/A:N/A] Primary Etiology: [2:Open Surgical Wound] [N/A:N/A] Date Acquired: [2:06/23/2003] [N/A:N/A] Weeks of Treatment: [2:72] [N/A:N/A] Wound Status: [2:Healed - Epithelialized] [N/A:N/A] Measurements L x W x D 0x0x0 [N/A:N/A] (cm) Area (cm) : [2:0] [N/A:N/A] Volume (cm) : [2:0] [N/A:N/A] % Reduction in Area: [2:100.00%] [N/A:N/A] % Reduction in Volume: 100.00% [N/A:N/A] Classification: [2:Full Thickness Without Exposed Support Structures] [N/A:N/A] Periwound Skin Texture: No Abnormalities Noted [N/A:N/A] Periwound Skin [2:No Abnormalities Noted]  [N/A:N/A] Moisture: Periwound Skin Color: No Abnormalities Noted [N/A:N/A] Tenderness on [2:No] [N/A:N/A] Treatment Notes Electronic Signature(s) Signed: 02/20/2015 4:20:29 PM By: Elliot Gurney, RN, BSN, Kim RN, BSN Entered By: Elliot Gurney, RN, BSN, Kim on 02/20/2015 13:20:38 [name], [name] ([id number], [name] ([id number]) -------------------------------------------------------------------------------- Multi-Disciplinary Care Plan Details Patient Name: [name], HUSBY. Date of Service: 02/20/2015 1:00 PM Medical Record Number: [id number] Patient Account Number: [id number] Date of Birth/Sex: 31-Jan-1929 (  81 y.o. Female) Treating RN: Huel CoventryWoody, Kim Primary Care Physician: Ronna PolioWalker, [name] Other Clinician: Referring Physician: Ronna PolioWalker, [name] Treating Physician/Extender: Rudene ReBritto, Errol Weeks in Treatment: 4372 Active Inactive Electronic Signature(s) Signed: 02/24/2015 5:42:28 PM By: Elliot GurneyWoody, RN, BSN, Kim RN, BSN Previous Signature: 02/20/2015 4:20:29 PM Version By: Elliot GurneyWoody, RN, BSN, Kim RN, BSN Entered By: Elliot GurneyWoody, RN, BSN, Kim on 02/24/2015 17:27:20 [name], [name] ([id number]) -------------------------------------------------------------------------------- Pain Assessment Details Patient Name: [name] SpringLLOYD, [name] Date of Service: 02/20/2015 1:00 PM Medical Record Number: [id number] Patient Account Number: [id number] Date of Birth/Sex: 12/10/1928 (81 y.o. Female) Treating RN: Huel CoventryWoody, Kim Primary Care Physician: Ronna PolioWalker, [name] Other Clinician: Referring Physician: Ronna PolioWalker, [name] Treating Physician/Extender: Rudene ReBritto, Errol Weeks in Treatment: 2672 Active Problems Location of Pain Severity and Description of Pain Patient Has Paino No Site Locations Pain Management and Medication Current Pain Management: Electronic Signature(s) Signed: 02/20/2015 4:20:29 PM By: Elliot GurneyWoody, RN, BSN, Kim RN, BSN Entered By: Elliot GurneyWoody, RN, BSN, Kim on 02/20/2015 13:00:22 [name] SpringLLOYD, Marci O.  ([id number]) -------------------------------------------------------------------------------- Patient/Caregiver Education Details Patient Name: [name] SpringLLOYD, Kalenna O. Date of Service: 02/20/2015 1:00 PM Medical Record Number: [id number] Patient Account Number: [id number] Date of Birth/Gender: 01/02/1929 (81 y.o. Female) Treating RN: Huel CoventryWoody, Kim Primary Care Physician: Ronna PolioWalker, [name] Other Clinician: Referring Physician: Ronna PolioWalker, [name] Treating Physician/Extender: Rudene ReBritto, Errol Weeks in Treatment: 172 Education Assessment Education Provided To: Patient Education Topics Provided Wound/Skin Impairment: Handouts: Caring for Your Ulcer, Other: continue wound care as ordered for 2 more weeks Methods: Demonstration Responses: State content correctly Electronic Signature(s) Signed: 02/20/2015 4:20:29 PM By: Elliot GurneyWoody, RN, BSN, Kim RN, BSN Entered By: Elliot GurneyWoody, RN, BSN, Kim on 02/20/2015 13:24:35 Scroggin, [name] ([id number]) -------------------------------------------------------------------------------- Wound Assessment Details Patient Name: [name] SpringLLOYD, Jenascia O. Date of Service: 02/20/2015 1:00 PM Medical Record Number: [id number] Patient Account Number: [id number] Date of Birth/Sex: 11/03/1928 (81 y.o. Female) Treating RN: Huel CoventryWoody, Kim Primary Care Physician: Ronna PolioWalker, [name] Other Clinician: Referring Physician: Ronna PolioWalker, [name] Treating Physician/Extender: Rudene ReBritto, Errol Weeks in Treatment: 72 Wound Status Wound Number: 2 Primary Etiology: Open Surgical Wound Wound Location: Abdomen - midline Wound Status: Healed - Epithelialized Wounding Event: Surgical Injury Date Acquired: 06/23/2003 Weeks Of Treatment: 72 Clustered Wound: No Wound Measurements Length: (cm) 0 % Reducti Width: (cm) 0 % Reducti Depth: (cm) 0 Area: (cm) 0 Volume: (cm) 0 on in Area: 100% on in Volume: 100% Wound Description Full Thickness Without Exposed Classification: Support Structures Periwound Skin Texture Texture  Color No Abnormalities Noted: No No Abnormalities Noted: No Moisture No Abnormalities Noted: No Electronic Signature(s) Signed: 02/20/2015 4:20:29 PM By: Elliot GurneyWoody, RN, BSN, Kim RN, BSN Entered By: Elliot GurneyWoody, RN, BSN, Kim on 02/20/2015 13:20:20 Vigen, [name] ([id number]) -------------------------------------------------------------------------------- Vitals Details Patient Name: [name] SpringLLOYD, Tinsley O. Date of Service: 02/20/2015 1:00 PM Medical Record Number: [id number] Patient Account Number: [id number] Date of Birth/Sex: 01/26/1929 (81 y.o. Female) Treating RN: Huel CoventryWoody, Kim Primary Care Physician: Ronna PolioWalker, [name] Other Clinician: Referring Physician: Ronna PolioWalker, [name] Treating Physician/Extender: Rudene ReBritto, Errol Weeks in Treatment: 5372 Vital Signs Time Taken: 13:00 Temperature (F): 97.7 Height (in): 59 Pulse (bpm): 64 Weight (lbs): 181 Respiratory Rate (breaths/min): 18 Body Mass Index (BMI): 36.6 Blood Pressure (mmHg): 98/68 Reference Range: 80 - 120 mg / dl Electronic Signature(s) Signed: 02/20/2015 4:20:29 PM By: Elliot GurneyWoody, RN, BSN, Kim RN, BSN Entered By: Elliot GurneyWoody, RN, BSN, Kim on 02/20/2015 13:02:09

## 2015-02-24 NOTE — Telephone Encounter (Signed)
Spoke with Mardene CelesteSharon Yow, RN concerning phone note below. Shon BatonSharon H Yow, RN at 01/30/2015 9:33 AM     Status: Signed       Expand All Collapse All   S/w patient daughter who indicates she has authorization for eliquis. States she thought she had to go through the authorization process every month but was told it was good for one year. Based on that information, patient is ok with staying on Eliquis. No further questions.                     I did not do PA on this medication. I  Called to verify with Pt daughter on 02/18/15 to verify if pt was able to get medication and she mentioned that Rx refill needed to be sent to pharmacy. She mentioned that PA had already been done and approved as stated in phone note above.  02/24/15 Spoke with pharmacist and mentioned pt was not approved due to Medicare Part D.  02/24/15 Spoke with Landry CorporalLakiesha for PA approval on Eliquis 2.5 Bid. Pt has been approved for Eliquis 02/24/15-02/24/16 PA approval # [id number].

## 2015-02-24 NOTE — Progress Notes (Signed)
[name], [name] ([id number]) Visit Report for 02/20/2015 Chief Complaint Document Details Patient Name: [name], [name]. Date of Service: 02/20/2015 1:00 PM Medical Record Number: [id number] Patient Account Number: [id number] Date of Birth/Sex: November 21, 1928 (81 y.o. Female) Treating RN: Primary Care Physician: Ronna Polio Other Clinician: Referring Physician: Ronna Polio Treating Physician/Extender: Rudene Re in Treatment: 65 Information Obtained from: Patient Chief Complaint Abdominal wall ulceration. This has been a chronic wound from a previous surgery 12 years ago. She has been seen in the wound clinic for a long while and she feels that she is doing much better now. No fresh issues. 11/07/2014 -- She was not here for the last 2 weeks because she had a mini stroke and was under treatment for this. other than that she's been doing fine as far as her abdomen goes and she has some home health nurses coming to help her with her dressing. Electronic Signature(s) Signed: 02/24/2015 12:24:54 PM By: Evlyn Kanner MD, FACS Entered By: Evlyn Kanner on 02/20/2015 13:25:27 ABRIL, CAPPIELLO ([id number]) -------------------------------------------------------------------------------- HPI Details Patient Name: [name] Date of Service: 02/20/2015 1:00 PM Medical Record Number: [id number] Patient Account Number: [id number] Date of Birth/Sex: [birth year] (81 y.o. Female) Treating RN: Primary Care Physician: Ronna Polio Other Clinician: Referring Physician: Ronna Polio Treating Physician/Extender: Rudene Re in Treatment: 4 History of Present Illness HPI Description: Pleasant 81 year old with h/o ex lap for pancreatitis, DM, CHF. returns to clinic for evaluation of her open abdominal wound. She has been applying silver alginate. She is without complaints today. No significant pain. No drainage. No fever or chills. Tolerating a regular diet and having regular  bowel movements. 10/17/14 -- she has no fresh complaints and she and her daughter thinks that everything is going fine. She says most of the areas have healed nicely. 11/28/2014 -- she has generally been doing fine and has no fresh issues. Wound care nurses will be able to come only twice a week now and she was wondering how her dressings would be done. 01/23/2015 - home health has been doing the dressings 3 times a week and she is doing very well and most of her wounds have healed. she has no new complaints. 02/20/2015 -- She comes today with no fresh complaints and says she may have her wound completely healed. Electronic Signature(s) Signed: 02/24/2015 12:24:54 PM By: Evlyn Kanner MD, FACS Entered By: Evlyn Kanner on 02/20/2015 13:25:32 [name], [name] ([id number]) -------------------------------------------------------------------------------- Physical Exam Details Patient Name: [name] Date of Service: 02/20/2015 1:00 PM Medical Record Number: [id number] Patient Account Number: [id number] Date of Birth/Sex: 1929-08-10 (81 y.o. Female) Treating RN: Primary Care Physician: Ronna Polio Other Clinician: Referring Physician: Ronna Polio Treating Physician/Extender: Rudene Re in Treatment: 72 Constitutional . Pulse regular. Respirations normal and unlabored. Afebrile. . Eyes Nonicteric. Reactive to light. Ears, Nose, Mouth, and Throat Lips, teeth, and gums WNL.Marland Kitchen Moist mucosa without lesions . Neck supple and nontender. No palpable supraclavicular or cervical adenopathy. Normal sized without goiter. Respiratory Breath sounds WNL, No rubs, rales, rhonchi, or wheeze.. Cardiovascular Pedal Pulses WNL. No clubbing, cyanosis or edema. Gastrointestinal (GI) the area where her surgery was done has completely healed and there is no ulceration or or tissue.Marland Kitchen No liver or spleen enlargement or tenderness.. Musculoskeletal Adexa without tenderness or enlargement..  Digits and nails w/o clubbing, cyanosis, infection, petechiae, ischemia, or inflammatory conditions.. Integumentary (Hair, Skin) No suspicious lesions. No crepitus or fluctuance. No peri-wound warmth or erythema. No masses.Marland Kitchen Psychiatric Judgement and insight Intact.Marland Kitchen  No evidence of depression, anxiety, or agitation.. Electronic Signature(s) Signed: 02/24/2015 12:24:54 PM By: Evlyn Kanner MD, FACS Entered By: Evlyn Kanner on 02/20/2015 13:26:14 [name], [name] ([id number]) -------------------------------------------------------------------------------- Physician Orders Details Patient Name: [name] Date of Service: 02/20/2015 1:00 PM Medical Record Number: [id number] Patient Account Number: [id number] Date of Birth/Sex: 1929/04/25 (81 y.o. Female) Treating RN: Huel Coventry Primary Care Physician: Ronna Polio Other Clinician: Referring Physician: Ronna Polio Treating Physician/Extender: Rudene Re in Treatment: 12 Verbal / Phone Orders: Yes Clinician: Huel Coventry Read Back and Verified: Yes Diagnosis Coding ICD-9 Coding Code Description 879.2 Open Wound - Abdominal Wall, anterior, w/o mention of complications Secondary diabetes mellitus without mention of complication; not stated as uncontrolled, 249.00 or unspecified 428.0 Congestive heart failure, unspecified 414.9 Coronary atherosclerosis; Chronic ischemic heart disease, unspecified 401.1 Essential Hypertension- Benign ICD-10 Coding Code Description Unspecified open wound of abdominal wall, epigastric region without penetration into S31.102S peritoneal cavity, sequela E11.622 Type 2 diabetes mellitus with other skin ulcer Related ICD-9 Code: 879.2 - Open Wound - Abdominal Wall, anterior, w/o mention of complications I50.20 Unspecified systolic (congestive) heart failure E66.9 Obesity, unspecified Unspecified open wound of abdominal wall, periumbilic region without penetration into S31.105D peritoneal  cavity, subsequent encounter Wound Cleansing o Clean wound with Normal Saline. Primary Wound Dressing o Acticoat 7 - HHRN to appy o Aquacel Ag - in clinic Secondary Dressing o Boardered Foam Dressing Home Health o Continue Home Health Visits - Please continue to see patient for next two weeks to make sure wound remains closed. o Home Health Nurse may visit PRN to address patientos wound care needs. o FACE TO FACE ENCOUNTER: MEDICARE and MEDICAID PATIENTS: I certify that this patient is under my care and that I had a face-to-face encounter that meets the physician face-to-face FREJA, FARO ([id number]) encounter requirements with this patient on this date. The encounter with the patient was in whole or in part for the following MEDICAL CONDITION: (primary reason for Home Healthcare) MEDICAL NECESSITY: I certify, that based on my findings, NURSING services are a medically necessary home health service. HOME BOUND STATUS: I certify that my clinical findings support that this patient is homebound (i.e., Due to illness or injury, pt requires aid of supportive devices such as crutches, cane, wheelchairs, walkers, the use of special transportation or the assistance of another person to leave their place of residence. There is a normal inability to leave the home and doing so requires considerable and taxing effort. Other absences are for medical reasons / religious services and are infrequent or of short duration when for other reasons). o If current dressing causes regression in wound condition, may D/C ordered dressing product/s and apply Normal Saline Moist Dressing daily until next Wound Healing Center / Other MD appointment. Notify Wound Healing Center of regression in wound condition at [phone]. o Please direct any NON-WOUND related issues/requests for orders to patient's Primary Care Physician Discharge From [facility] o Discharge from Wound Care  Center Electronic Signature(s) Signed: 02/20/2015 4:20:29 PM By: Elliot Gurney RN, BSN, Kim RN, BSN Signed: 02/24/2015 12:24:54 PM By: Evlyn Kanner MD, FACS Entered By: Elliot Gurney RN, BSN, Kim on 02/20/2015 13:23:06 [name], [name] ([id number]) -------------------------------------------------------------------------------- Problem List Details Patient Name: [name], [name]. Date of Service: 02/20/2015 1:00 PM Medical Record Number: [id number] Patient Account Number: [id number] Date of Birth/Sex: 1929/02/05 (81 y.o. Female) Treating RN: Primary Care Physician: Ronna Polio Other Clinician: Referring Physician: Ronna Polio Treating Physician/Extender: Rudene Re in Treatment: 32 Active Problems  ICD-9 Encounter Code Description Active Date Diagnosis 879.2 Open Wound - Abdominal Wall, anterior, w/o mention of 09/30/2013 Yes complications 249.00 Secondary diabetes mellitus without mention of 09/30/2013 Yes complication; not stated as uncontrolled, or unspecified 428.0 Congestive heart failure, unspecified 09/30/2013 Yes 414.9 Coronary atherosclerosis; Chronic ischemic heart disease, 09/30/2013 Yes unspecified 401.1 Essential Hypertension- Benign 09/30/2013 Yes ICD-10 Encounter Code Description Active Date Diagnosis S31.102S Unspecified open wound of abdominal wall, epigastric 09/05/2014 Yes region without penetration into peritoneal cavity, sequela E11.622 Type 2 diabetes mellitus with other skin ulcer 09/05/2014 Yes Related ICD-9 Code: 879.2 - Open Wound - Abdominal Wall, anterior, w/o mention of complications I50.20 Unspecified systolic (congestive) heart failure 09/05/2014 Yes E66.9 Obesity, unspecified 09/05/2014 Yes Colon, Xitlalli O. ([id number]) S31.105D Unspecified open wound of abdominal wall, periumbilic 10/17/2014 Yes region without penetration into peritoneal cavity, subsequent encounter Inactive Problems Resolved Problems Electronic Signature(s) Signed: 02/24/2015 12:24:54 PM By:  Evlyn Kanner MD, FACS Entered By: Evlyn Kanner on 02/20/2015 13:25:09 [name] ([id number]) -------------------------------------------------------------------------------- Progress Note Details Patient Name: [name] Date of Service: 02/20/2015 1:00 PM Medical Record Number: [id number] Patient Account Number: [id number] Date of Birth/Sex: [birth year] (81 y.o. Female) Treating RN: Primary Care Physician: Ronna Polio Other Clinician: Referring Physician: Ronna Polio Treating Physician/Extender: Rudene Re in Treatment: 40 Subjective Chief Complaint Information obtained from Patient Abdominal wall ulceration. This has been a chronic wound from a previous surgery 12 years ago. She has been seen in the wound clinic for a long while and she feels that she is doing much better now. No fresh issues. 11/07/2014 -- She was not here for the last 2 weeks because she had a mini stroke and was under treatment for this. other than that she's been doing fine as far as her abdomen goes and she has some home health nurses coming to help her with her dressing. History of Present Illness (HPI) Pleasant 81 year old with h/o ex lap for pancreatitis, DM, CHF. returns to clinic for evaluation of her open abdominal wound. She has been applying silver alginate. She is without complaints today. No significant pain. No drainage. No fever or chills. Tolerating a regular diet and having regular bowel movements. 10/17/14 -- she has no fresh complaints and she and her daughter thinks that everything is going fine. She says most of the areas have healed nicely. 11/28/2014 -- she has generally been doing fine and has no fresh issues. Wound care nurses will be able to come only twice a week now and she was wondering how her dressings would be done. 01/23/2015 - home health has been doing the dressings 3 times a week and she is doing very well and most of her wounds have healed. she has no  new complaints. 02/20/2015 -- She comes today with no fresh complaints and says she may have her wound completely healed. Objective Constitutional Pulse regular. Respirations normal and unlabored. Afebrile. Vitals Time Taken: 1:00 PM, Height: 59 in, Weight: 181 lbs, BMI: 36.6, Temperature: 97.7 F, Pulse: 64 bpm, Respiratory Rate: 18 breaths/min, Blood Pressure: 98/68 mmHg. [name], [name] ([id number]) Eyes Nonicteric. Reactive to light. Ears, Nose, Mouth, and Throat Lips, teeth, and gums WNL.Marland Kitchen Moist mucosa without lesions . Neck supple and nontender. No palpable supraclavicular or cervical adenopathy. Normal sized without goiter. Respiratory Breath sounds WNL, No rubs, rales, rhonchi, or wheeze.. Cardiovascular Pedal Pulses WNL. No clubbing, cyanosis or edema. Gastrointestinal (GI) the area where her surgery was done has completely healed and there is no ulceration or or tissue.Marland Kitchen  No liver or spleen enlargement or tenderness.. Musculoskeletal Adexa without tenderness or enlargement.. Digits and nails w/o clubbing, cyanosis, infection, petechiae, ischemia, or inflammatory conditions.Marland Kitchen. Psychiatric Judgement and insight Intact.. No evidence of depression, anxiety, or agitation.. Integumentary (Hair, Skin) No suspicious lesions. No crepitus or fluctuance. No peri-wound warmth or erythema. No masses.. Wound #2 status is Healed - Epithelialized. Original cause of wound was Surgical Injury. The wound is located on the Abdomen - midline. The wound measures 0cm length x 0cm width x 0cm depth; 0cm^2 area and 0cm^3 volume. Assessment Active Problems ICD-9 879.2 - Open Wound - Abdominal Wall, anterior, w/o mention of complications 249.00 - Secondary diabetes mellitus without mention of complication; not stated as uncontrolled, or unspecified 428.0 - Congestive heart failure, unspecified 414.9 - Coronary atherosclerosis; Chronic ischemic heart disease, unspecified 401.1 - Essential  Hypertension- Benign [name], [name] ([id number]) ICD-10 S31.102S - Unspecified open wound of abdominal wall, epigastric region without penetration into peritoneal cavity, sequela E11.622 - Type 2 diabetes mellitus with other skin ulcer I50.20 - Unspecified systolic (congestive) heart failure E66.9 - Obesity, unspecified S31.105D - Unspecified open wound of abdominal wall, periumbilic region without penetration into peritoneal cavity, subsequent encounter The wound has completely healed though at some areas there is some very thin epithelization. I have asked her to continue local care and be very gentle when she showers. She is discharged from the wound care services and will be seen back as needed. Plan Wound Cleansing: Clean wound with Normal Saline. Primary Wound Dressing: Acticoat 7 - HHRN to appy Aquacel Ag - in clinic Secondary Dressing: Boardered Foam Dressing Home Health: Continue Home Health Visits - Please continue to see patient for next two weeks to make sure wound remains closed. Home Health Nurse may visit PRN to address patient s wound care needs. FACE TO FACE ENCOUNTER: MEDICARE and MEDICAID PATIENTS: I certify that this patient is under my care and that I had a face-to-face encounter that meets the physician face-to-face encounter requirements with this patient on this date. The encounter with the patient was in whole or in part for the following MEDICAL CONDITION: (primary reason for Home Healthcare) MEDICAL NECESSITY: I certify, that based on my findings, NURSING services are a medically necessary home health service. HOME BOUND STATUS: I certify that my clinical findings support that this patient is homebound (i.e., Due to illness or injury, pt requires aid of supportive devices such as crutches, cane, wheelchairs, walkers, the use of special transportation or the assistance of another person to leave their place of residence. There is a normal inability to leave  the home and doing so requires considerable and taxing effort. Other absences are for medical reasons / religious services and are infrequent or of short duration when for other reasons). If current dressing causes regression in wound condition, may D/C ordered dressing product/s and apply Normal Saline Moist Dressing daily until next Wound Healing Center / Other MD appointment. Notify Wound Healing Center of regression in wound condition at [phone]. Please direct any NON-WOUND related issues/requests for orders to patient's Primary Care Physician [facility] [name], [name] LorettoO. ([id number]) The wound has completely healed though at some areas there is some very thin epithelization. I have asked her to continue local care and be very gentle when she showers. She is discharged from the wound care services and will be seen back as needed. Electronic Signature(s) Signed: 02/24/2015 12:24:54 PM By: Evlyn KannerBritto, Burak Zerbe MD, FACS Entered By: Meyer RusselBritto,  Teller Wakefield on 02/20/2015 13:27:15 [name], [name] ([id number]) -------------------------------------------------------------------------------- SuperBill Details Patient Name: [name], SCHLESINGER. Date of Service: 02/20/2015 Medical Record Number: [id number] Patient Account Number: [id number] Date of Birth/Sex: 1928/12/30 May 27, 81 y.o. Female) Treating RN: Primary Care Physician: Ronna Polio Other Clinician: Referring Physician: Ronna Polio Treating Physician/Extender: Rudene Re in Treatment: 27 Diagnosis Coding ICD-9 Codes Code Description 879.2 Open Wound - Abdominal Wall, anterior, w/o mention of complications Secondary diabetes mellitus without mention of complication; not stated as uncontrolled, 249.00 or unspecified 428.0 Congestive heart failure, unspecified 414.9 Coronary atherosclerosis; Chronic ischemic heart disease, unspecified 401.1 Essential Hypertension- Benign ICD-10 Codes Code  Description Unspecified open wound of abdominal wall, epigastric region without penetration into S31.102S peritoneal cavity, sequela E11.622 Type 2 diabetes mellitus with other skin ulcer Related ICD-9 Code: 879.2 - Open Wound - Abdominal Wall, anterior, w/o mention of complications I50.20 Unspecified systolic (congestive) heart failure E66.9 Obesity, unspecified Unspecified open wound of abdominal wall, periumbilic region without penetration into S31.105D peritoneal cavity, subsequent encounter Facility Procedures CPT4 Code: [id number] Description: [phone] - WOUND CARE VISIT-LEV 2 EST PT Modifier: Quantity: 1 Physician Procedures CPT4: Description Modifier Quantity Code [id number] - WC PHYS LEVEL 3 - EST PT 1 ICD-10 Description Diagnosis S31.102S Unspecified open wound of abdominal wall, epigastric region without penetration into peritoneal cavity, sequela E11.622 Type 2  diabetes mellitus with other skin ulcer S31.105D LAZARIAH, SAVARD ([id number]) Electronic Signature(s) Signed: 02/24/2015 12:24:54 PM By: Evlyn Kanner MD, FACS Entered By: Evlyn Kanner on 02/20/2015 13:27:34

## 2015-02-24 NOTE — Telephone Encounter (Signed)
Spoke with Landry CorporalLakiesha concerning Eliquis PA [phone]. Pt has been approved for Eliquis 2.5 mg from 02/24/2015-02/24/2016 PA [id number] approval number.  Pt was not approved on 01/30/2015 due to insurance purposes. I did not personally do PA at due to last Telephone note as noted below. I had spoke to verify information and she had stated that she was told that PA was already done and only RX needed to be sent when I had spoke to pt on 02/12/2014.  Shon BatonSharon H Yow, RN at 01/30/2015 9:33 AM     Status: Signed       Expand All Collapse All   S/w patient daughter who indicates she has authorization for eliquis. States she thought she had to go through the authorization process every month but was told it was good for one year. Based on that information, patient is ok with staying on Eliquis. No further questions.              Coralee RudSabrina F Gilley at 01/29/2015 10:41 AM     Status: Signed       Expand All Collapse All   Pt daughter states that the 3 drugs that were discussed this morning, need prior auth.. Please call. States only has 3 days worth of medicatioin. Pt daughter asks if she can just stay on Plavix, it does not require a prior auth            Shon BatonSharon H Yow, RN at 01/29/2015 10:22 AM     Status: Signed       Expand All Collapse All   S/w daughter with [name] recommendations based on 4/26 visit which states:. if there is a cost for Eliquis and this is too expensive, please ask your pharmacist about the cost of two other alternatives: - Pradaxa - Xarelto  Patient's daughter indicates she will call pharmacist today and will notify us which one is cheaper.              [name] SaasHiraa M [name] at 01/29/2015 8:52 AM     Status: Signed       Expand All Collapse All   Pt daughter calling stating that patient is now done with the prescription Eliquis  She would like to go back Clopidogrel for the eliquis is a bit expensive.  It did not have any refill so they  assume this is what is going to happen  But if not could they just go back on to Clopidogrel. Please advise.

## 2015-03-02 ENCOUNTER — Ambulatory Visit (INDEPENDENT_AMBULATORY_CARE_PROVIDER_SITE_OTHER): Payer: Medicare Other | Admitting: Cardiovascular Disease

## 2015-03-02 ENCOUNTER — Encounter: Payer: Self-pay | Admitting: Cardiovascular Disease

## 2015-03-02 ENCOUNTER — Telehealth: Payer: Self-pay | Admitting: Cardiovascular Disease

## 2015-03-02 ENCOUNTER — Telehealth: Payer: Self-pay | Admitting: *Deleted

## 2015-03-02 VITALS — BP 140/82 | HR 71 | Ht 59.0 in | Wt 179.0 lb

## 2015-03-02 DIAGNOSIS — I639 Cerebral infarction, unspecified: Secondary | ICD-10-CM

## 2015-03-02 DIAGNOSIS — I10 Essential (primary) hypertension: Secondary | ICD-10-CM | POA: Diagnosis not present

## 2015-03-02 DIAGNOSIS — R0602 Shortness of breath: Secondary | ICD-10-CM

## 2015-03-02 DIAGNOSIS — I2510 Atherosclerotic heart disease of native coronary artery without angina pectoris: Secondary | ICD-10-CM | POA: Diagnosis not present

## 2015-03-02 DIAGNOSIS — I480 Paroxysmal atrial fibrillation: Secondary | ICD-10-CM | POA: Insufficient documentation

## 2015-03-02 MED ORDER — APIXABAN 2.5 MG PO TABS: 2.5000 mg | ORAL_TABLET | Freq: Two times a day (BID) | ORAL | Status: DC

## 2015-03-02 NOTE — Patient Instructions (Signed)
Medication Instructions:  Your physician has recommended you make the following change in your medication:  STOP taking aspirin START taking Eliquis 2.5mg  twice per day   Labwork: none  Testing/Procedures: none  Follow-Up: Your physician recommends that you schedule a follow-up appointment in: three months with Dr. Kirke CorinArida.    Any Other Special Instructions Will Be Listed Below (If Applicable).

## 2015-03-02 NOTE — Telephone Encounter (Signed)
Needs follow up 30min 

## 2015-03-02 NOTE — Assessment & Plan Note (Signed)
Blood pressure is reasonably controlled off  lisinopril and amlodipine. Continue to monitor for now. We might have to accept a higher pressure given history of Parkinson's and likely orthostatic hypotension.

## 2015-03-02 NOTE — Telephone Encounter (Signed)
[name] from [facility] called states pts fasting blood sugars this morning was 219 and yesterday it was 260.  [name] does not believe pt is keeping track of her blood sugars or is taking her sliding scale correctly.  Please advise

## 2015-03-02 NOTE — Telephone Encounter (Signed)
Pt was seen by Dr. Kirke CorinArida today @ 2:30.

## 2015-03-02 NOTE — Progress Notes (Signed)
Primary care physician: Dr. Ronna PolioJennifer [name]   HPI  This is an 81 year old female who is here today for a followup visit regarding coronary artery disease and chronic diastolic heart failure. She has known history of  DM, refractory HTN, pancreatic insufficiency, and osteoarthritis .  She was admitted in January, 2015  with worsening dyspnea. She was noted to have right lower lobe pneumonia. She was treated with antibiotics. BNP was mildly elevated. She underwent a pharmacologic nuclear stress test which showed evidence of anterior wall ischemia with normal ejection fraction. I proceeded with cardiac catheterization which showed chronically occluded mid LAD with right-to-left collaterals. There was high-grade stenosis in a large OM 2 extending to the apex. This was treated with angioplasty and drug-eluting stent placement.  She was hospitalized on December,2015 with stroke symptoms. MRI showed small area of infarct in the right parietal lobe. Echocardiogram was unremarkable. Loop recorder showed evidence of paroxysmal atrial fibrillation. She was started on anticoagulation with Eliquis.  [facility] for frequent falls. Brain MRI showed no new changes. Neurology recommended increasing the dose of Sinemet for Parkinson's. She was noted to have low blood pressure and thus both amlodipine and lisinopril were discontinued. Skilled nursing facility was recommended but the patient declined. Anticoagulation was stopped due to concerns about frequent falls. However, she had only 2 episodes of falling in the last 6 months. The most recent one was in his in the setting of low blood pressure. She did not have syncope and reports tripping on an obstacle while walking.   Allergies  Allergen Reactions  . Citalopram Hydrobromide     REACTION: nausea, dizzy, dry mouth  . Diphenhydramine     Other reaction(s): Unknown  . Diphenhydramine Hcl     REACTION: unspecified  . Metoclopramide       Other reaction(s): Unknown  . Metoclopramide Hcl     REACTION: unspecified  . Paroxetine     REACTION: hallucinations  . Tape Other (See Comments) and Rash     Current Outpatient Prescriptions on File Prior to Visit  Medication Sig Dispense Refill  . ALPRAZolam (XANAX) 1 MG tablet TAKE 1/2 TABLET IN THE MORNING TAKE 1/2 TABLET AT LUNCH AND TAKE 1 TABLET AT BEDTIME IF NEEDED 60 tablet 5  . busPIRone (BUSPAR) 10 MG tablet TAKE ONE (1) TABLET BY MOUTH TWO (2) TIMES DAILY 180 tablet 0  . Carbidopa-Levodopa ER (SINEMET CR) 25-100 MG tablet controlled release Take 2 tablets by mouth 2 (two) times daily. 60 tablet 0  . cetirizine (ZYRTEC) 10 MG tablet Take 10 mg by mouth daily.    . fluticasone (FLONASE) 50 MCG/ACT nasal spray USE 2 SPRAYS EACH NOSTRIL DAILY 16 g 3  . furosemide (LASIX) 40 MG tablet Take 1 tablet (40 mg total) by mouth daily. 90 tablet 3  . gentamicin ointment (GARAMYCIN) 0.1 % Apply 1 application topically 3 (three) times daily. 30 g 0  . HYDROcodone-acetaminophen (NORCO/VICODIN) 5-325 MG per tablet Take 1-2 tablets by mouth 3 (three) times daily as needed for moderate pain. 120 tablet 0  . insulin glargine (LANTUS) 100 UNIT/ML injection Inject 0.55 mLs (55 Units total) into the skin at bedtime. 10 mL 6  . insulin lispro (HUMALOG) 100 UNIT/ML injection Inject 0.08-0.18 mLs (8-18 Units total) into the skin 3 (three) times daily before meals. 10 mL 6  . levothyroxine (SYNTHROID, LEVOTHROID) 75 MCG tablet TAKE ONE (1) TABLET EACH DAY 90 tablet 1  . metoprolol (LOPRESSOR) 100 MG  tablet Take 1 tablet (100 mg total) by mouth at bedtime. 30 tablet 0  . Multiple Vitamins-Minerals (CENTRUM SILVER PO) Take 1 tablet by mouth daily.      Marland Kitchen omeprazole (PRILOSEC) 20 MG capsule TAKE ONE CAPSULE BY MOUTH DAILY 30 capsule 6  . ondansetron (ZOFRAN) 4 MG tablet Take 1 tablet (4 mg total) by mouth every 8 (eight) hours as needed. 90 tablet 11  . pravastatin (PRAVACHOL) 40 MG tablet Take 1 tablet  (40 mg total) by mouth daily. 30 tablet 5  . PROAIR HFA 108 (90 BASE) MCG/ACT inhaler INHALE TWO PUFFS EVERY 6 HOURS AS NEEDEDFOR WHEEZE OR SHORTNESS OF BREATH 8.5 g 3  . rOPINIRole (REQUIP) 0.5 MG tablet Take 1 tablet (0.5 mg total) by mouth at bedtime. 30 tablet 0  . triamcinolone cream (KENALOG) 0.1 % Apply topically 2 (two) times daily. 30 g 0  . ULTICARE INSULIN SYRINGE 31G X 5/16" 0.5 ML MISC AS DIRECTED 100 each 6  . ZENPEP 15000 UNITS CPEP TAKE ONE (1) CAPSULE THREE (3) TIMES EACH DAY 100 capsule 6   No current facility-administered medications on file prior to visit.     Past Medical History  Diagnosis Date  . Anxiety   . Chronic diastolic CHF (congestive heart failure)   . Depression   . Diabetes mellitus     Followed by Dr. Tedd Sias at [facility]  . GERD (gastroesophageal reflux disease)   . Hypertension   . Hypothyroidism   . Osteoporosis   . Allergy   . Hyperlipidemia   . RLS (restless legs syndrome)   . Pancreatic disease     pancreatic failure  . Urinary incontinence   . Pancreatitis     Pancreatic resection/ open wound- [facility] 11/04  . Pneumonia     Pneumonia/ Emphysema 05/05  . 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 TIA (transient ischemic attack)     recurrent 12/15  . History of pleural empyema   . Sepsis   . Frequent falls   . Arthritis     left knee, [facility]  . CAD (coronary artery disease), native coronary artery      1//15  T mid-LAD  stent OM2  . Parkinson's disease      Past Surgical History  Procedure Laterality Date  . Breast lumpectomy      Lumpectomy right breast  . 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 Abdominal hysterectomy      Hysteroscopy/ D & C ([place]) 12/05  . Removal of pancreas  2004  . Kyphoplasty  2005  . Broken shoulder and orbital bone  2005  . Cholecystectomy  1973  . Thoracentesis    . Vaginal delivery      9  . Cardiac  catheterization  08/29/2013    x1 stent @ [facility]  . Cataract surgery       Family History  Problem Relation Age of Onset  . Heart disease Mother   . Heart attack Mother   . Hypertension Mother   . Heart attack Father      History   Social History  . Marital Status: Widowed    Spouse Name: N/A  . Number of Children: 7  . Years of Education: 6   Occupational History  . retired- Veterinary surgeon    Social History Main Topics  . Smoking status: Never Smoker   . Smokeless tobacco:  Never Used  . Alcohol Use: No  . Drug Use: No  . Sexual Activity: Not on file   Other Topics Concern  . Not on file   Social History Narrative   Lives in home alone, has nursing aid, and alert device in [place]. Has 7 children.            Has [name], daughter, as health care POA   Not sure about DNR   Permission to speak to daughter-in-law, Leylani Duley, regarding health care matters.     ROS A 10 point review of system was performed. It is negative other than that mentioned in the history of present illness.   PHYSICAL EXAM   BP 140/82 mmHg  Pulse 71  Ht 4\' 11"  (1.499 m)  Wt 179 lb (81.194 kg)  BMI 36.13 kg/m2 Constitutional: She is oriented to person, place, and time. She appears well-developed and well-nourished. No distress.  HENT: No nasal discharge.  Head: Normocephalic and atraumatic.  Eyes: Pupils are equal and round. No discharge.  Neck: Normal range of motion. Neck supple. No JVD present. No thyromegaly present.  Cardiovascular: Normal rate, regular rhythm, normal heart sounds. Exam reveals no gallop and no friction rub. No murmur heard.  Pulmonary/Chest: Effort normal and breath sounds normal. No stridor. No respiratory distress. She has no wheezes. She has no rales. She exhibits no tenderness.  Abdominal: Soft. Bowel sounds are normal. She exhibits no distension. There is no tenderness. There is no rebound and no guarding.  Musculoskeletal: Normal range of motion. She  exhibits no edema and no tenderness.  Neurological: She is alert and oriented to person, place, and time. Coordination normal.  Skin: Skin is warm and dry. No rash noted. She is not diaphoretic. No erythema. No pallor.  Psychiatric: She has a normal mood and affect. Her behavior is normal. Judgment and thought content normal.   EKG: Sinus  Rhythm  Low voltage in precordial leads.   -Nonspecific ST changes  ABNORMAL      ASSESSMENT AND PLAN

## 2015-03-02 NOTE — Assessment & Plan Note (Signed)
The patient has documented atrial fibrillation on loop recorder and previous stroke. Thus, there is a strong indication for anticoagulation. I believe the benefits of anticoagulation outweigh the risks given that her falling has not been very frequent. Thus, I recommend resuming anticoagulation with low dose Eliquis. The low dose was chosen initially given that her creatinine was above 1.5. The trend has improved since then. If no recurrent episodes of falling and kidney function continues to be less than 1.5, I plan on increasing the dose to 5 mg twice daily.

## 2015-03-02 NOTE — Telephone Encounter (Signed)
Patient has New SOB with minimal activity   Pt c/o Shortness Of Breath: STAT if SOB developed within the last 24 hours or pt is noticeably SOB on the phone  1. Are you currently SOB (can you hear that pt is SOB on the phone)? (H/H nurse lacretia called)   Yes  2. How long have you been experiencing SOB? Since atleast Friday per caretaker   3. Are you SOB when sitting or when up moving around?  Not with sitting but upon standing - SOB with Minimal exertion   4. Are you currently experiencing any other symptoms? Feels like heart racing, weakness,

## 2015-03-02 NOTE — Assessment & Plan Note (Signed)
Stent placement was more than one year ago. Thus, I discontinued aspirin given that she is now on anticoagulation. She complains of significant exertional dyspnea but she appears to be very physically deconditioned. She is now getting physical therapy and hopefully this will help.

## 2015-03-03 NOTE — Telephone Encounter (Signed)
Spoke with pt and Arnold LongLaKresha both aware of appoint

## 2015-03-04 ENCOUNTER — Encounter: Payer: Self-pay | Admitting: Nurse Practitioner

## 2015-03-04 DIAGNOSIS — Z09 Encounter for follow-up examination after completed treatment for conditions other than malignant neoplasm: Secondary | ICD-10-CM | POA: Insufficient documentation

## 2015-03-04 NOTE — Assessment & Plan Note (Signed)
She still has some short term memory loss. Her daughter Steward DroneBrenda is helping her today and is adjunct historian.

## 2015-03-04 NOTE — Assessment & Plan Note (Signed)
Pt has home health nurse, nurse aide for ADL's, a walker for ambulation, they are talking about a rolling walker as well for home, and will start PT at home soon.

## 2015-03-04 NOTE — Assessment & Plan Note (Addendum)
[facility] D/c of 02/11/15 She is compliant with stopping the medications she was told to  She will be starting PT soon, has a nurse and nurse aide that visit multiple times a week.  She is following her recommended diet   Daughter asking about her Eliquis- there is confusion over whether it was authorized or not. Will follow.

## 2015-03-04 NOTE — Assessment & Plan Note (Signed)
S/p CVA see Dr. Tilman NeatWalker's note from 10/23/14. Pt not on anticoagulation currently. Will follow up with Dr. Kirke CorinArida (Cardiology) to find out about upcoming visit and what to do for Eliquis. CT head reviewed from hospital follow up personally and findings include:   No acute intracranial process identified, generalized cerebral atrophy w/ chronic microvascular ischemic disease and remote infarcts involving the right frontoparietal region and right cerebellar hemisphere.

## 2015-03-05 ENCOUNTER — Telehealth: Payer: Self-pay | Admitting: Internal Medicine

## 2015-03-05 ENCOUNTER — Telehealth: Payer: Self-pay | Admitting: *Deleted

## 2015-03-05 ENCOUNTER — Encounter: Payer: Self-pay | Admitting: Internal Medicine

## 2015-03-05 ENCOUNTER — Ambulatory Visit: Payer: Medicare Other | Admitting: Internal Medicine

## 2015-03-05 NOTE — Telephone Encounter (Signed)
[facility] to let Dr. Dan HumphreysWalker know that the patient had a fasting blood sugar of 317 today and that the appt today was cancelled due to pt family unaware of appt date and time.msn

## 2015-03-05 NOTE — Telephone Encounter (Signed)
It is 2 weeks too early for a refill.

## 2015-03-05 NOTE — Telephone Encounter (Signed)
pts daughter called requesting Vicodin refill.  Last refill 6.16.16, last OV 6.30.16.  Please advise refill

## 2015-03-06 ENCOUNTER — Other Ambulatory Visit: Payer: Self-pay | Admitting: *Deleted

## 2015-03-06 DIAGNOSIS — M1710 Unilateral primary osteoarthritis, unspecified knee: Secondary | ICD-10-CM

## 2015-03-06 DIAGNOSIS — IMO0002 Reserved for concepts with insufficient information to code with codable children: Secondary | ICD-10-CM

## 2015-03-06 MED ORDER — HYDROCODONE-ACETAMINOPHEN 5-325 MG PO TABS: 1.0000 | ORAL_TABLET | Freq: Three times a day (TID) | ORAL | Status: DC | PRN

## 2015-03-06 NOTE — Telephone Encounter (Signed)
[name] HymenBonnie called again states #120 Vicodin lasts for one month.  States pt takes 4-5 tabs daily and as per Rx pt can take up to 6 tabs daily which would be #180.  Please advise

## 2015-03-06 NOTE — Telephone Encounter (Signed)
We can refill Hydrocodone #60 for 2 weeks supply until I can talk with her further.

## 2015-03-06 NOTE — Telephone Encounter (Signed)
Spoke with pts daughter, schedule pt 7.19.16 at 4:15pm.  States pt will be out of medication on Sunday 7.17.16.  Please advise

## 2015-03-06 NOTE — Telephone Encounter (Signed)
Spoke with pts daughter, advised Rx wil be ready for pick up this afternoon.  She verbalized understanding

## 2015-03-06 NOTE — Telephone Encounter (Signed)
This is an excessive amount of tylenol. I would recommend that we set up evaluation with pain management. The max I am comfortable prescribing for one month is 120.  We should see her in a follow up visit. She needs to repeat a UDS.

## 2015-03-10 ENCOUNTER — Encounter: Payer: Self-pay | Admitting: Internal Medicine

## 2015-03-10 ENCOUNTER — Ambulatory Visit (INDEPENDENT_AMBULATORY_CARE_PROVIDER_SITE_OTHER): Payer: Medicare Other | Admitting: Internal Medicine

## 2015-03-10 VITALS — BP 130/75 | HR 71 | Temp 98.3°F | Wt 177.4 lb

## 2015-03-10 DIAGNOSIS — Z79899 Other long term (current) drug therapy: Secondary | ICD-10-CM | POA: Diagnosis not present

## 2015-03-10 DIAGNOSIS — I480 Paroxysmal atrial fibrillation: Secondary | ICD-10-CM

## 2015-03-10 DIAGNOSIS — R5382 Chronic fatigue, unspecified: Secondary | ICD-10-CM

## 2015-03-10 DIAGNOSIS — M179 Osteoarthritis of knee, unspecified: Secondary | ICD-10-CM | POA: Diagnosis not present

## 2015-03-10 DIAGNOSIS — M79675 Pain in left toe(s): Secondary | ICD-10-CM | POA: Insufficient documentation

## 2015-03-10 DIAGNOSIS — M1710 Unilateral primary osteoarthritis, unspecified knee: Secondary | ICD-10-CM

## 2015-03-10 DIAGNOSIS — N179 Acute kidney failure, unspecified: Secondary | ICD-10-CM | POA: Diagnosis not present

## 2015-03-10 DIAGNOSIS — I639 Cerebral infarction, unspecified: Secondary | ICD-10-CM | POA: Diagnosis not present

## 2015-03-10 DIAGNOSIS — IMO0002 Reserved for concepts with insufficient information to code with codable children: Secondary | ICD-10-CM

## 2015-03-10 DIAGNOSIS — N189 Chronic kidney disease, unspecified: Secondary | ICD-10-CM

## 2015-03-10 NOTE — Progress Notes (Signed)
Subjective:    Patient ID: [name], female    DOB: 31-Dec-1928, 81 y.o.   MRN: [id number]  HPI  81YO female presents for follow up.  She continues to feel generally fatigued. No focal symptoms. Denies chest pain, dyspnea, change in BMs. Has poor appetite. However, overall her daughter feels that she is improving after recent hospitalization.  She was recently seen by Dr. Kirke Corin in follow up. She has a Loop recorder in place. She was started back on Eliquis for stroke prevention. She has not had any recent falls.  She continues to have severe knee pain and back pain which is worsened with movement. She continues to take Hydrocodone up to three times daily for pain.  Over the last few weeks, she has developed some left 3rd toe pain. She denies injury to her toe. She denies any redness or swelling.  Past medical, surgical, family and social history per today's encounter.  Review of Systems  Constitutional: Positive for appetite change and fatigue. Negative for fever, chills and unexpected weight change.  Eyes: Negative for visual disturbance.  Respiratory: Negative for shortness of breath.   Cardiovascular: Negative for chest pain and leg swelling.  Gastrointestinal: Negative for nausea, vomiting, abdominal pain, diarrhea and constipation.  Genitourinary: Negative for dysuria, urgency and frequency.  Musculoskeletal: Positive for myalgias, arthralgias and gait problem.  Skin: Negative for color change and rash.  Hematological: Negative for adenopathy. Does not bruise/bleed easily.  Psychiatric/Behavioral: Negative for sleep disturbance and dysphoric mood. The patient is not nervous/anxious.        Objective:    BP 130/75 mmHg  Pulse 71  Temp(Src) 98.3 F (36.8 C) (Oral)  Wt 177 lb 6 oz (80.457 kg)  SpO2 98% Physical Exam  Constitutional: She is oriented to person, place, and time. She appears well-developed and well-nourished. No distress.  HENT:  Head: Normocephalic  and atraumatic.  Right Ear: External ear normal.  Left Ear: External ear normal.  Nose: Nose normal.  Mouth/Throat: Oropharynx is clear and moist. No oropharyngeal exudate.  Eyes: Conjunctivae are normal. Pupils are equal, round, and reactive to light. Right eye exhibits no discharge. Left eye exhibits no discharge. No scleral icterus.  Neck: Normal range of motion. Neck supple. No tracheal deviation present. No thyromegaly present.  Cardiovascular: Normal rate, regular rhythm, normal heart sounds and intact distal pulses.  Exam reveals no gallop and no friction rub.   No murmur heard. Pulmonary/Chest: Effort normal and breath sounds normal. No respiratory distress. She has no wheezes. She has no rales. She exhibits no tenderness.  Musculoskeletal: Normal range of motion. She exhibits no edema or tenderness.       Feet:  Lymphadenopathy:    She has no cervical adenopathy.  Neurological: She is alert and oriented to person, place, and time. No cranial nerve deficit. She exhibits normal muscle tone. Coordination normal.  Skin: Skin is warm and dry. No rash noted. She is not diaphoretic. No erythema. No pallor.  Psychiatric: She has a normal mood and affect. Her behavior is normal. Judgment and thought content normal.          Assessment & Plan:   Problem List Items Addressed This Visit      Unprioritized   Acute on chronic renal failure    Recheck renal function with labs today.      Chronic fatigue - Primary    Chronic fatigue multifactorial. With atrial fibrillation, diabetes, chronic severe pain from OA. Will recheck CBC, CMP,  TSH, B12 with labs.      Relevant Orders   Comprehensive metabolic panel   CBC   B12   TSH   Ferritin   Osteoarthrosis, unspecified whether generalized or localized, involving lower leg    Severe bilateral knee OA with chronic pain. Unable to take NSAIDS. No improvement with Tylenol, Tramadol. Currently using Vicodin 4 tabs daily. Controlled  substance contract on file. Discussed risks of chronic narcotic use.        Paroxysmal atrial fibrillation    Reviewed notes from Dr. Kirke CorinArida. Recently started back on Eliquis and appears to be tolerating well. Continue Metoprolol, Eliquis.      Toe pain, left    Left toe pain, however exam is normal. Discussed option of xray versus monitoring. She feels this is improving, so will just monitor for now.          Return in about 4 weeks (around 04/07/2015) for Recheck.

## 2015-03-10 NOTE — Assessment & Plan Note (Signed)
Severe bilateral knee OA with chronic pain. Unable to take NSAIDS. No improvement with Tylenol, Tramadol. Currently using Vicodin 4 tabs daily. Controlled substance contract on file. Discussed risks of chronic narcotic use.

## 2015-03-10 NOTE — Patient Instructions (Signed)
Labs today.  Follow up in 4 weeks. 

## 2015-03-10 NOTE — Assessment & Plan Note (Signed)
Reviewed notes from Dr. Kirke CorinArida. Recently started back on Eliquis and appears to be tolerating well. Continue Metoprolol, Eliquis.

## 2015-03-10 NOTE — Assessment & Plan Note (Signed)
Left toe pain, however exam is normal. Discussed option of xray versus monitoring. She feels this is improving, so will just monitor for now.

## 2015-03-10 NOTE — Assessment & Plan Note (Signed)
Chronic fatigue multifactorial. With atrial fibrillation, diabetes, chronic severe pain from OA. Will recheck CBC, CMP, TSH, B12 with labs.

## 2015-03-10 NOTE — Assessment & Plan Note (Addendum)
Recheck renal function with labs today. 

## 2015-03-11 LAB — COMPREHENSIVE METABOLIC PANEL
ALK PHOS: 80 U/L (ref 39–117)
ALT: 6 U/L (ref 0–35)
AST: 19 U/L (ref 0–37)
Albumin: 3.5 g/dL (ref 3.5–5.2)
BUN: 21 mg/dL (ref 6–23)
CO2: 28 meq/L (ref 19–32)
CREATININE: 1.06 mg/dL (ref 0.40–1.20)
Calcium: 9.3 mg/dL (ref 8.4–10.5)
Chloride: 104 mEq/L (ref 96–112)
GFR: 52.54 mL/min — ABNORMAL LOW (ref >60.00)
Glucose, Bld: 92 mg/dL (ref 70–99)
POTASSIUM: 5 meq/L (ref 3.5–5.1)
SODIUM: 142 meq/L (ref 135–145)
Total Bilirubin: 0.3 mg/dL (ref 0.2–1.2)
Total Protein: 6.2 g/dL (ref 6.0–8.3)

## 2015-03-11 LAB — CBC
HCT: 37.7 % (ref 36.0–46.0)
Hemoglobin: 12.3 g/dL (ref 12.0–15.0)
MCHC: 32.7 g/dL (ref 30.0–36.0)
MCV: 91.6 fl (ref 78.0–100.0)
Platelets: 151 10*3/uL (ref 150.0–400.0)
RBC: 4.11 Mil/uL (ref 3.87–5.11)
RDW: 13.9 % (ref 11.5–15.5)
WBC: 6.3 10*3/uL (ref 4.0–10.5)

## 2015-03-11 LAB — FERRITIN: Ferritin: 8.8 ng/mL — ABNORMAL LOW (ref 10.0–291.0)

## 2015-03-11 LAB — VITAMIN B12: VITAMIN B 12: 295 pg/mL (ref 211–911)

## 2015-03-11 LAB — TSH: TSH: 0.49 u[IU]/mL (ref 0.35–4.50)

## 2015-03-12 ENCOUNTER — Other Ambulatory Visit: Payer: Self-pay | Admitting: *Deleted

## 2015-03-12 ENCOUNTER — Inpatient Hospital Stay
Admission: EM | Admit: 2015-03-12 | Discharge: 2015-03-14 | DRG: 378 | Disposition: A | Discharge status: Home Health | Payer: Medicare Other | Attending: Internal Medicine | Admitting: Internal Medicine

## 2015-03-12 ENCOUNTER — Encounter: Payer: Self-pay | Admitting: Emergency Medicine

## 2015-03-12 ENCOUNTER — Other Ambulatory Visit: Payer: Self-pay | Admitting: Internal Medicine

## 2015-03-12 ENCOUNTER — Telehealth: Payer: Self-pay | Admitting: Internal Medicine

## 2015-03-12 ENCOUNTER — Telehealth: Payer: Self-pay | Admitting: *Deleted

## 2015-03-12 DIAGNOSIS — M810 Age-related osteoporosis without current pathological fracture: Secondary | ICD-10-CM | POA: Diagnosis present

## 2015-03-12 DIAGNOSIS — Z888 Allergy status to other drugs, medicaments and biological substances status: Secondary | ICD-10-CM | POA: Diagnosis not present

## 2015-03-12 DIAGNOSIS — N183 Chronic kidney disease, stage 3 (moderate): Secondary | ICD-10-CM | POA: Diagnosis present

## 2015-03-12 DIAGNOSIS — I2510 Atherosclerotic heart disease of native coronary artery without angina pectoris: Secondary | ICD-10-CM | POA: Diagnosis present

## 2015-03-12 DIAGNOSIS — K921 Melena: Secondary | ICD-10-CM | POA: Diagnosis present

## 2015-03-12 DIAGNOSIS — I480 Paroxysmal atrial fibrillation: Secondary | ICD-10-CM | POA: Diagnosis present

## 2015-03-12 DIAGNOSIS — G2581 Restless legs syndrome: Secondary | ICD-10-CM | POA: Diagnosis present

## 2015-03-12 DIAGNOSIS — Z7951 Long term (current) use of inhaled steroids: Secondary | ICD-10-CM

## 2015-03-12 DIAGNOSIS — F329 Major depressive disorder, single episode, unspecified: Secondary | ICD-10-CM | POA: Diagnosis present

## 2015-03-12 DIAGNOSIS — R109 Unspecified abdominal pain: Secondary | ICD-10-CM

## 2015-03-12 DIAGNOSIS — I5032 Chronic diastolic (congestive) heart failure: Secondary | ICD-10-CM | POA: Diagnosis present

## 2015-03-12 DIAGNOSIS — K219 Gastro-esophageal reflux disease without esophagitis: Secondary | ICD-10-CM | POA: Diagnosis present

## 2015-03-12 DIAGNOSIS — Z794 Long term (current) use of insulin: Secondary | ICD-10-CM | POA: Diagnosis not present

## 2015-03-12 DIAGNOSIS — E039 Hypothyroidism, unspecified: Secondary | ICD-10-CM | POA: Diagnosis present

## 2015-03-12 DIAGNOSIS — F419 Anxiety disorder, unspecified: Secondary | ICD-10-CM | POA: Diagnosis present

## 2015-03-12 DIAGNOSIS — E1122 Type 2 diabetes mellitus with diabetic chronic kidney disease: Secondary | ICD-10-CM | POA: Diagnosis present

## 2015-03-12 DIAGNOSIS — E785 Hyperlipidemia, unspecified: Secondary | ICD-10-CM | POA: Diagnosis present

## 2015-03-12 DIAGNOSIS — Z79899 Other long term (current) drug therapy: Secondary | ICD-10-CM

## 2015-03-12 DIAGNOSIS — Z8673 Personal history of transient ischemic attack (TIA), and cerebral infarction without residual deficits: Secondary | ICD-10-CM

## 2015-03-12 DIAGNOSIS — I129 Hypertensive chronic kidney disease with stage 1 through stage 4 chronic kidney disease, or unspecified chronic kidney disease: Secondary | ICD-10-CM | POA: Diagnosis present

## 2015-03-12 DIAGNOSIS — F418 Other specified anxiety disorders: Secondary | ICD-10-CM | POA: Diagnosis present

## 2015-03-12 DIAGNOSIS — Z79891 Long term (current) use of opiate analgesic: Secondary | ICD-10-CM | POA: Diagnosis not present

## 2015-03-12 DIAGNOSIS — K922 Gastrointestinal hemorrhage, unspecified: Secondary | ICD-10-CM | POA: Diagnosis present

## 2015-03-12 LAB — CBC WITH DIFFERENTIAL/PLATELET
Basophils Absolute: 0 10*3/uL (ref 0–0.1)
Basophils Relative: 1 %
EOS PCT: 2 %
Eosinophils Absolute: 0.1 10*3/uL (ref 0–0.7)
HCT: 38.3 % (ref 35.0–47.0)
Hemoglobin: 12.4 g/dL (ref 12.0–16.0)
LYMPHS ABS: 1.7 10*3/uL (ref 1.0–3.6)
Lymphocytes Relative: 30 %
MCH: 29.5 pg (ref 26.0–34.0)
MCHC: 32.3 g/dL (ref 32.0–36.0)
MCV: 91.4 fL (ref 80.0–100.0)
MONO ABS: 0.5 10*3/uL (ref 0.2–0.9)
Monocytes Relative: 9 %
NEUTROS ABS: 3.3 10*3/uL (ref 1.4–6.5)
Neutrophils Relative %: 58 %
Platelets: 150 10*3/uL (ref 150–440)
RBC: 4.19 MIL/uL (ref 3.80–5.20)
RDW: 13.9 % (ref 11.5–14.5)
WBC: 5.6 10*3/uL (ref 3.6–11.0)

## 2015-03-12 LAB — COMPREHENSIVE METABOLIC PANEL
ALT: 6 U/L — ABNORMAL LOW (ref 14–54)
AST: 27 U/L (ref 15–41)
Albumin: 3.6 g/dL (ref 3.5–5.0)
Alkaline Phosphatase: 87 U/L (ref 38–126)
Anion gap: 10 (ref 5–15)
BUN: 18 mg/dL (ref 6–20)
CO2: 29 mmol/L (ref 22–32)
Calcium: 8.8 mg/dL — ABNORMAL LOW (ref 8.9–10.3)
Chloride: 100 mmol/L — ABNORMAL LOW (ref 101–111)
Creatinine, Ser: 1.2 mg/dL — ABNORMAL HIGH (ref 0.44–1.00)
GFR calc non Af Amer: 41 mL/min — ABNORMAL LOW (ref >60)
GFR, EST AFRICAN AMERICAN: 47 mL/min — AB (ref >60)
GLUCOSE: 244 mg/dL — AB (ref 65–99)
POTASSIUM: 4.4 mmol/L (ref 3.5–5.1)
Sodium: 139 mmol/L (ref 135–145)
TOTAL PROTEIN: 6.5 g/dL (ref 6.5–8.1)
Total Bilirubin: 0.5 mg/dL (ref 0.3–1.2)

## 2015-03-12 LAB — PROTIME-INR
INR: 1.11
Prothrombin Time: 14.5 seconds (ref 11.4–15.0)

## 2015-03-12 LAB — GLUCOSE, CAPILLARY
Glucose-Capillary: 176 mg/dL — ABNORMAL HIGH (ref 65–99)
Glucose-Capillary: 203 mg/dL — ABNORMAL HIGH (ref 65–99)

## 2015-03-12 LAB — APTT: aPTT: 25 seconds (ref 24–36)

## 2015-03-12 LAB — HEMOGLOBIN
HEMOGLOBIN: 11.9 g/dL — AB (ref 12.0–16.0)
Hemoglobin: 11.4 g/dL — ABNORMAL LOW (ref 12.0–16.0)

## 2015-03-12 MED ORDER — CETYLPYRIDINIUM CHLORIDE 0.05 % MT LIQD
7.0000 mL | Freq: Two times a day (BID) | OROMUCOSAL | Status: DC
  Administered 2015-03-12 – 2015-03-14 (×3): 7 mL via OROMUCOSAL

## 2015-03-12 MED ORDER — ACETAMINOPHEN 325 MG PO TABS: 650.0000 mg | ORAL_TABLET | Freq: Four times a day (QID) | ORAL | Status: DC | PRN

## 2015-03-12 MED ORDER — INSULIN ASPART 100 UNIT/ML ~~LOC~~ SOLN
0.0000 [IU] | Freq: Every day | SUBCUTANEOUS | Status: DC
  Administered 2015-03-12: 2 [IU] via SUBCUTANEOUS
  Filled 2015-03-12: qty 2

## 2015-03-12 MED ORDER — LORATADINE 10 MG PO TABS
10.0000 mg | ORAL_TABLET | Freq: Every day | ORAL | Status: DC
  Administered 2015-03-12 – 2015-03-14 (×2): 10 mg via ORAL
  Filled 2015-03-12 (×2): qty 1

## 2015-03-12 MED ORDER — LEVOTHYROXINE SODIUM 75 MCG PO TABS
75.0000 ug | ORAL_TABLET | Freq: Every day | ORAL | Status: DC
  Administered 2015-03-14: 75 ug via ORAL
  Filled 2015-03-12: qty 1

## 2015-03-12 MED ORDER — ROPINIROLE HCL 0.25 MG PO TABS
0.5000 mg | ORAL_TABLET | Freq: Every day | ORAL | Status: DC
  Administered 2015-03-12 – 2015-03-13 (×2): 0.5 mg via ORAL
  Filled 2015-03-12 (×2): qty 2

## 2015-03-12 MED ORDER — HYDROCODONE-ACETAMINOPHEN 5-325 MG PO TABS
1.0000 | ORAL_TABLET | Freq: Three times a day (TID) | ORAL | Status: DC | PRN
  Administered 2015-03-14: 1 via ORAL
  Filled 2015-03-12: qty 1

## 2015-03-12 MED ORDER — BUSPIRONE HCL 10 MG PO TABS
10.0000 mg | ORAL_TABLET | Freq: Two times a day (BID) | ORAL | Status: DC
  Administered 2015-03-12 – 2015-03-14 (×4): 10 mg via ORAL
  Filled 2015-03-12 (×5): qty 1

## 2015-03-12 MED ORDER — INSULIN GLARGINE 100 UNIT/ML ~~LOC~~ SOLN
55.0000 [IU] | Freq: Every day | SUBCUTANEOUS | Status: DC
  Administered 2015-03-12 – 2015-03-13 (×2): 55 [IU] via SUBCUTANEOUS
  Filled 2015-03-12 (×3): qty 0.55

## 2015-03-12 MED ORDER — PANTOPRAZOLE SODIUM 40 MG IV SOLR
40.0000 mg | Freq: Two times a day (BID) | INTRAVENOUS | Status: DC
  Administered 2015-03-12 – 2015-03-14 (×5): 40 mg via INTRAVENOUS
  Filled 2015-03-12 (×5): qty 40

## 2015-03-12 MED ORDER — CARBIDOPA-LEVODOPA ER 25-100 MG PO TBCR
2.0000 | EXTENDED_RELEASE_TABLET | Freq: Two times a day (BID) | ORAL | Status: DC
  Administered 2015-03-12 – 2015-03-14 (×4): 2 via ORAL
  Filled 2015-03-12 (×4): qty 2

## 2015-03-12 MED ORDER — ONDANSETRON HCL 4 MG PO TABS: 4.0000 mg | ORAL_TABLET | Freq: Four times a day (QID) | ORAL | Status: DC | PRN

## 2015-03-12 MED ORDER — ACETAMINOPHEN 650 MG RE SUPP: 650.0000 mg | Freq: Four times a day (QID) | RECTAL | Status: DC | PRN

## 2015-03-12 MED ORDER — ALBUTEROL SULFATE HFA 108 (90 BASE) MCG/ACT IN AERS: 1.0000 | INHALATION_SPRAY | Freq: Four times a day (QID) | RESPIRATORY_TRACT | Status: DC | PRN

## 2015-03-12 MED ORDER — CYANOCOBALAMIN 1000 MCG/ML IJ SOLN: 1000.0000 ug | INTRAMUSCULAR | Status: DC

## 2015-03-12 MED ORDER — SODIUM CHLORIDE 0.9 % IV SOLN
INTRAVENOUS | Status: DC
  Administered 2015-03-12 – 2015-03-14 (×4): via INTRAVENOUS

## 2015-03-12 MED ORDER — METOPROLOL TARTRATE 100 MG PO TABS
100.0000 mg | ORAL_TABLET | Freq: Every day | ORAL | Status: DC
  Administered 2015-03-12 – 2015-03-13 (×2): 100 mg via ORAL
  Filled 2015-03-12 (×2): qty 1

## 2015-03-12 MED ORDER — INSULIN ASPART 100 UNIT/ML ~~LOC~~ SOLN
0.0000 [IU] | Freq: Three times a day (TID) | SUBCUTANEOUS | Status: DC
  Administered 2015-03-12: 18:00:00 2 [IU] via SUBCUTANEOUS
  Filled 2015-03-12: qty 2

## 2015-03-12 MED ORDER — ALBUTEROL SULFATE (2.5 MG/3ML) 0.083% IN NEBU: 2.5000 mg | INHALATION_SOLUTION | RESPIRATORY_TRACT | Status: DC | PRN

## 2015-03-12 MED ORDER — ALPRAZOLAM 0.5 MG PO TABS
0.5000 mg | ORAL_TABLET | Freq: Every evening | ORAL | Status: DC | PRN
  Administered 2015-03-13: 0.5 mg via ORAL
  Filled 2015-03-12: qty 1

## 2015-03-12 MED ORDER — ONDANSETRON HCL 4 MG/2ML IJ SOLN
4.0000 mg | Freq: Four times a day (QID) | INTRAMUSCULAR | Status: DC | PRN
  Administered 2015-03-13: 09:00:00 4 mg via INTRAVENOUS
  Filled 2015-03-12: qty 2

## 2015-03-12 NOTE — Consult Note (Signed)
See complete consult by Sung Amabile.  Pt had an episode of BRBPR while on Eliquis and is not having any further bleeding and Cari's rectal exam was neg.  Likely Eliquis was at least partly to blame.  Given her age and other factors I do not recommend a colonoscopy unless she has further bleeding.  Discussed with patient.

## 2015-03-12 NOTE — Telephone Encounter (Signed)
Agree with above 

## 2015-03-12 NOTE — Progress Notes (Signed)
   03/12/15 1515  Clinical Encounter Type  Visited With Patient  Visit Type Initial  Referral From Nurse  Spiritual Encounters  Spiritual Needs Prayer  Stress Factors  Patient Stress Factors Health changes  Chaplain spoke with patient about recent health changes and offered spiritual support and prayer.  Chaplain Payslee Bateson [phone]

## 2015-03-12 NOTE — Consult Note (Signed)
GI Inpatient Consult Note  Reason for Consult: GI Bleed / rectal bleed    Attending Requesting Consult: Dr. Imogene Burn  History of Present Illness: [name] is a 81 y.o. female who reports that yesterday afternoon around 6:00 p.m. She had a stool that also had bright red blood in it.  She reported as about a half a cup of fresh blood.  She denies any abdominal pain, nausea, vomiting, or diarrhea.  She has had 1 bowel movement since then without any visible blood.  She endorses that she has been very weak and has not had much of an appetite for the past month.  She was on an aspirin regimen, about a month ago she was switched to Eliquis.   She endorses shortness of breath, she uses 2 L of oxygen at night.  She recently had a heart monitor and cardiac stents.  She reports she had a colonoscopy 10-12 years ago.  Past Medical History:  Past Medical History  Diagnosis Date  . Anxiety   . Chronic diastolic CHF (congestive heart failure)   . Depression   . Diabetes mellitus     Followed by Dr. Tedd Sias at [facility]  . GERD (gastroesophageal reflux disease)   . Hypertension   . Hypothyroidism   . Osteoporosis   . Allergy   . Hyperlipidemia   . RLS (restless legs syndrome)   . Pancreatic disease     pancreatic failure  . Urinary incontinence   . Pancreatitis     Pancreatic resection/ open wound- [facility] 11/04  . Pneumonia     Pneumonia/ Emphysema 05/05  . 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 TIA (transient ischemic attack)     recurrent 12/15  . History of pleural empyema   . Sepsis   . Frequent falls   . Arthritis     left knee, [facility]  . CAD (coronary artery disease), native coronary artery      1//15  T mid-LAD  stent OM2  . Parkinson's disease     Problem List: Patient Active Problem List   Diagnosis Date Noted  . GIB (gastrointestinal bleeding) 03/12/2015  . Chronic fatigue 03/10/2015  . Toe pain, left 03/10/2015  .  Paroxysmal atrial fibrillation 03/02/2015  . CKD (chronic kidney disease), stage III 02/12/2015  . Frequent falls 02/12/2015  . Anxiety 02/12/2015  . H/O resection of pancreas 02/12/2015  . Acute on chronic renal failure 02/10/2015  . Dysphagia, pharyngoesophageal phase 01/23/2015  . Memory loss 10/23/2014  . CVA (cerebral infarction) 08/07/2014  . Left rotator cuff tear 08/07/2014  . Coronary artery disease 09/12/2013  . Dyspnea 06/20/2013  . Tremor 06/07/2012  . Osteoarthrosis, unspecified whether generalized or localized, involving lower leg 02/27/2012  . Open abdominal wall wound 04/12/2010  . THROMBOCYTOPENIA 07/30/2009  . Hyperlipidemia 10/27/2008  . ALLERGIC RHINITIS 07/21/2008  . Hypothyroidism 03/07/2007  . Diabetes type 2, controlled 03/07/2007  . ANXIETY 03/07/2007  . DEPRESSION 03/07/2007  . Essential hypertension 03/07/2007  . FAILURE, DIASTOLIC HEART, CHRONIC 03/07/2007  . GERD 03/07/2007    Past Surgical History: Past Surgical History  Procedure Laterality Date  . Breast lumpectomy      Lumpectomy right breast  . 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 Abdominal hysterectomy      Hysteroscopy/ D & C ([facility]) 12/05  . Removal of pancreas  2004  . Kyphoplasty  2005  .  Broken shoulder and orbital bone  2005  . Cholecystectomy  1973  . Thoracentesis    . Vaginal delivery      7  . Cardiac catheterization  08/29/2013    x1 stent @ [facility]  . Cataract surgery      Allergies: Allergies  Allergen Reactions  . Citalopram Hydrobromide Nausea And Vomiting and Other (See Comments)    Reaction:  Dizziness and dry mouth   . Diphenhydramine Other (See Comments)    Reaction:  Unknown   . Metoclopramide Other (See Comments)    Reaction:  Unknown   . Paroxetine Other (See Comments)    Reaction:  Hallucinations   . Tape Rash    Home Medications: Prescriptions prior to admission  Medication Sig Dispense Refill Last Dose   . albuterol (PROVENTIL HFA;VENTOLIN HFA) 108 (90 BASE) MCG/ACT inhaler Inhale 2 puffs into the lungs every 6 (six) hours as needed for wheezing or shortness of breath.   PRN at PRN  . ALPRAZolam (XANAX) 1 MG tablet Take 0.5-1 mg by mouth 3 (three) times daily as needed for anxiety. Pt takes one-half tablet in the morning, one-half tablet at lunch, and one tablet at bedtime if needed.   PRN at PRN  . apixaban (ELIQUIS) 2.5 MG TABS tablet Take 1 tablet (2.5 mg total) by mouth 2 (two) times daily. (Patient taking differently: Take 2.5 mg by mouth daily. ) 60 tablet 5 unknown at unknown  . busPIRone (BUSPAR) 10 MG tablet Take 10 mg by mouth 2 (two) times daily.   unknown at unknown  . Carbidopa-Levodopa ER (SINEMET CR) 25-100 MG tablet controlled release Take 2 tablets by mouth 2 (two) times daily. 60 tablet 0 unknown at unknown  . cetirizine (ZYRTEC) 10 MG tablet Take 10 mg by mouth daily.   unknown at unknown  . fluticasone (FLONASE) 50 MCG/ACT nasal spray Place 2 sprays into both nostrils daily.   unknown at unknown  . furosemide (LASIX) 40 MG tablet Take 1 tablet (40 mg total) by mouth daily. 90 tablet 3 unknown at unknown  . gentamicin ointment (GARAMYCIN) 0.1 % Apply 1 application topically 3 (three) times daily. 30 g 0 unknown at unknown  . HYDROcodone-acetaminophen (NORCO/VICODIN) 5-325 MG per tablet Take 1-2 tablets by mouth 3 (three) times daily as needed for moderate pain. 60 tablet 0 PRN at PRN  . insulin glargine (LANTUS) 100 UNIT/ML injection Inject 0.55 mLs (55 Units total) into the skin at bedtime. 10 mL 6 unknown at unknown  . insulin lispro (HUMALOG) 100 UNIT/ML injection Inject 0.08-0.18 mLs (8-18 Units total) into the skin 3 (three) times daily before meals. 10 mL 6 unknown at unknown  . levothyroxine (SYNTHROID, LEVOTHROID) 75 MCG tablet Take 75 mcg by mouth daily.   unknown at unknown  . metoprolol (LOPRESSOR) 100 MG tablet Take 1 tablet (100 mg total) by mouth at bedtime. 30 tablet  0 unknown at unknown  . Multiple Vitamins-Minerals (CENTRUM SILVER PO) Take 1 tablet by mouth daily.     unknown at unknown  . omeprazole (PRILOSEC) 20 MG capsule Take 20 mg by mouth daily.   unknown at unknown  . ondansetron (ZOFRAN) 4 MG tablet Take 1 tablet (4 mg total) by mouth every 8 (eight) hours as needed. (Patient taking differently: Take 4 mg by mouth every 8 (eight) hours as needed for nausea or vomiting. ) 90 tablet 11 PRN at PRN  . Pancrelipase, Lip-Prot-Amyl, (ZENPEP) 15000 UNITS CPEP Take 1 capsule by mouth 3 (three)  times daily.   unknown at unknown  . pravastatin (PRAVACHOL) 40 MG tablet Take 40 mg by mouth daily.   unknown at unknown  . rOPINIRole (REQUIP) 0.5 MG tablet Take 1 tablet (0.5 mg total) by mouth at bedtime. 30 tablet 0 unknown at unknown  . triamcinolone cream (KENALOG) 0.1 % Apply topically 2 (two) times daily. (Patient taking differently: Apply 1 application topically 2 (two) times daily. ) 30 g 0 unknown at unknown   Home medication reconciliation was completed with the patient.   Scheduled Inpatient Medications:   . busPIRone  10 mg Oral BID  . Carbidopa-Levodopa ER  2 tablet Oral BID  . insulin aspart  0-5 Units Subcutaneous QHS  . insulin aspart  0-9 Units Subcutaneous TID WC  . insulin glargine  55 Units Subcutaneous QHS  . [START ON 03/13/2015] levothyroxine  75 mcg Oral QAC breakfast  . loratadine  10 mg Oral Daily  . metoprolol  100 mg Oral QHS  . pantoprazole (PROTONIX) IV  40 mg Intravenous Q12H  . rOPINIRole  0.5 mg Oral QHS    Continuous Inpatient Infusions:   . sodium chloride      PRN Inpatient Medications:  acetaminophen **OR** acetaminophen, albuterol, ALPRAZolam, HYDROcodone-acetaminophen, ondansetron **OR** ondansetron (ZOFRAN) IV  Family History: family history includes Heart attack in her father and mother; Heart disease in her mother; Hypertension in her mother.  The patient's family history is negative for inflammatory bowel  disorders, GI malignancy, or solid organ transplantation.  Social History:   reports that she has never smoked. She has never used smokeless tobacco. She reports that she does not drink alcohol or use illicit drugs. The patient denies ETOH, tobacco, or drug use.   Review of Systems: Constitutional: Weight is stable.  Eyes: No changes in vision. ENT: No oral lesions, sore throat.  GI: see HPI.  Heme/Lymph: No easy bruising.  CV: No chest pain.  GU: No hematuria.  Integumentary: No rashes.  Neuro: No headaches.  Psych: No depression/anxiety.  Endocrine: No heat/cold intolerance.  Allergic/Immunologic: No urticaria.  Resp: No cough  Musculoskeletal: No joint swelling.    Physical Examination: BP 155/54 mmHg  Pulse 59  Temp(Src) 98.6 F (37 C) (Oral)  Resp 13  Ht 4\' 11"  (1.499 m)  Wt 86.183 kg (190 lb)  BMI 38.35 kg/m2  SpO2 96% Gen: NAD, alert and oriented x 4 HEENT: PEERLA, EOMI, Neck: supple, no JVD or thyromegaly Chest: CTA bilaterally, no wheezes, crackles, or other adventitious sounds CV: RRR, no m/g/c/r Abd: soft, NT, ND, +BS in all four quadrants; no HSM, guarding, ridigity, or rebound tenderness Ext: no edema, well perfused with 2+ pulses, Skin: no rash or lesions noted Lymph: no LAD Rectal exam:   No stool in the vault.  Mucus was checked, heme-negative Data: Lab Results  Component Value Date   WBC 5.6 03/12/2015   HGB 12.4 03/12/2015   HCT 38.3 03/12/2015   MCV 91.4 03/12/2015   PLT 150 03/12/2015    Recent Labs Lab 03/10/15 1702 03/12/15 1113  HGB 12.3 12.4   Lab Results  Component Value Date   NA 139 03/12/2015   K 4.4 03/12/2015   CL 100* 03/12/2015   CO2 29 03/12/2015   BUN 18 03/12/2015   CREATININE 1.20* 03/12/2015   Lab Results  Component Value Date   ALT 6* 03/12/2015   AST 27 03/12/2015   ALKPHOS 87 03/12/2015   BILITOT 0.5 03/12/2015    Recent Labs Lab 03/12/15 1113  APTT 25  INR 1.11   Imaging:  CLINICAL DATA:  81 year old female with episodes of silent aspiration. Initial encounter.  EXAM: ESOPHOGRAM/BARIUM SWALLOW  TECHNIQUE: Single contrast examination was performed using thin barium.  FLUOROSCOPY TIME: Radiation Exposure Index (as provided by the fluoroscopic device): 86.77 micro Gy cm2  COMPARISON: None.  FINDINGS: Per speech pathologist, patient aspirates and it was requested that the examination be performed with the patient in the upright position. Patient was not able to stand on her own and therefore examination was performed with patient in a 45 degree head off position.  With ingestion of a single mouthful of contrast, no esophageal obstructing obstructing lesion is noted however, there is poor esophageal motility with the patient having difficulty clearing the single ingested mouthful of contrast. Examination was terminated this point. Limited for evaluating for mucosal abnormality.  Post cement augmentation lower thoracic vertebra.  IMPRESSION: Limited examination with poor esophageal motility as noted above. Patient may be safe for p.o. intake.  Electronically Signed: By: Lacy Duverney M.D. On: 02/12/2015 14:22 Assessment/Plan: Ms. Timpone is a 81 y.o. female with GI bleed  Recommendations:  Her hemoglobin was at 12.3 and then a 12.4.  A previous record of June 24 showed a hemoglobin at 9.8.   She has had 1 episode of bleeding.  Her second stool had no blood.  There was no stool in the vault when tested.  I did check the mucus and it was heme negative. We believe this may be a hemorrhoidal bleed, or possibly a diverticular bleed.  We recommend to continue  with serial H&H.  We recommend to continue to monitor her stools for any further bleeding.  We recommend observing her for the next 24 hours.  We will continue to follow with you Thank you for the consult. Please call with questions or concerns.  Carney Harder, PA-C  I personally performed these  services.

## 2015-03-12 NOTE — ED Provider Notes (Signed)
[facility] Emergency Department Provider Note  ____________________________________________  Time seen: Approximately 11:36 AM  I have reviewed the triage vital signs and the nursing notes.   HISTORY  Chief Complaint Blood In Stools    HPI [name] is a 81 y.o. female with stage III kidney disease, history of pancreatic resection, history of CVA on Eliquis, coronary artery disease who presents for evaluation of bright red blood per rectum since yesterday. Patient reports that she saw bright blood in her bowel movement last night. Today she has had some darker blood. She denies any abdominal pain but is having mild nausea. This is been intermittent since onset, sudden onset. Current severity is moderate. She has never had issues with GI bleeding before. No modifying factors. No chest pain or difficulty breathing.   Past Medical History  Diagnosis Date  . Anxiety   . Chronic diastolic CHF (congestive heart failure)   . Depression   . Diabetes mellitus     Followed by Dr. Tedd Sias at [facility]  . GERD (gastroesophageal reflux disease)   . Hypertension   . Hypothyroidism   . Osteoporosis   . Allergy   . Hyperlipidemia   . RLS (restless legs syndrome)   . Pancreatic disease     pancreatic failure  . Urinary incontinence   . Pancreatitis     Pancreatic resection/ open wound- [facility] 11/04  . Pneumonia     Pneumonia/ Emphysema 05/05  . 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 TIA (transient ischemic attack)     recurrent 12/15  . History of pleural empyema   . Sepsis   . Frequent falls   . Arthritis     left knee, [facility]  . CAD (coronary artery disease), native coronary artery      1//15  T mid-LAD  stent OM2  . Parkinson's disease     Patient Active Problem List   Diagnosis Date Noted  . Chronic fatigue 03/10/2015  . Toe pain, left 03/10/2015  . Paroxysmal atrial fibrillation 03/02/2015  . CKD  (chronic kidney disease), stage III 02/12/2015  . Frequent falls 02/12/2015  . Anxiety 02/12/2015  . H/O resection of pancreas 02/12/2015  . Acute on chronic renal failure 02/10/2015  . Dysphagia, pharyngoesophageal phase 01/23/2015  . Memory loss 10/23/2014  . CVA (cerebral infarction) 08/07/2014  . Left rotator cuff tear 08/07/2014  . Coronary artery disease 09/12/2013  . Dyspnea 06/20/2013  . Tremor 06/07/2012  . Osteoarthrosis, unspecified whether generalized or localized, involving lower leg 02/27/2012  . Open abdominal wall wound 04/12/2010  . THROMBOCYTOPENIA 07/30/2009  . Hyperlipidemia 10/27/2008  . ALLERGIC RHINITIS 07/21/2008  . Hypothyroidism 03/07/2007  . Diabetes type 2, controlled 03/07/2007  . ANXIETY 03/07/2007  . DEPRESSION 03/07/2007  . Essential hypertension 03/07/2007  . FAILURE, DIASTOLIC HEART, CHRONIC 03/07/2007  . GERD 03/07/2007    Past Surgical History  Procedure Laterality Date  . Breast lumpectomy      Lumpectomy right breast  . 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 Abdominal hysterectomy      Hysteroscopy/ D & C ([facility]) 12/05  . Removal of pancreas  2004  . Kyphoplasty  2005  . Broken shoulder and orbital bone  2005  . Cholecystectomy  1973  . Thoracentesis    . Vaginal delivery      7  . Cardiac catheterization  08/29/2013  x1 stent @ [facility]  . Cataract surgery      Current Outpatient Rx  Name  Route  Sig  Dispense  Refill  . ALPRAZolam (XANAX) 1 MG tablet      TAKE 1/2 TABLET IN THE MORNING TAKE 1/2 TABLET AT LUNCH AND TAKE 1 TABLET AT BEDTIME IF NEEDED   60 tablet   5   . apixaban (ELIQUIS) 2.5 MG TABS tablet   Oral   Take 1 tablet (2.5 mg total) by mouth 2 (two) times daily.   60 tablet   5   . busPIRone (BUSPAR) 10 MG tablet      TAKE ONE (1) TABLET BY MOUTH TWO (2) TIMES DAILY   180 tablet   0   . Carbidopa-Levodopa ER (SINEMET CR) 25-100 MG tablet controlled release    Oral   Take 2 tablets by mouth 2 (two) times daily.   60 tablet   0   . cetirizine (ZYRTEC) 10 MG tablet   Oral   Take 10 mg by mouth daily.         . cyanocobalamin (,VITAMIN B-12,) 1000 MCG/ML injection   Intramuscular   Inject 1 mL (1,000 mcg total) into the muscle every 30 (thirty) days.   1 mL   5     Inject 1 mL (1,000 mcg total) into muscle once mon ...   . fluticasone (FLONASE) 50 MCG/ACT nasal spray      USE 2 SPRAYS EACH NOSTRIL DAILY   16 g   3   . furosemide (LASIX) 40 MG tablet   Oral   Take 1 tablet (40 mg total) by mouth daily.   90 tablet   3   . gentamicin ointment (GARAMYCIN) 0.1 %   Topical   Apply 1 application topically 3 (three) times daily.   30 g   0   . HYDROcodone-acetaminophen (NORCO/VICODIN) 5-325 MG per tablet   Oral   Take 1-2 tablets by mouth 3 (three) times daily as needed for moderate pain.   60 tablet   0   . insulin glargine (LANTUS) 100 UNIT/ML injection   Subcutaneous   Inject 0.55 mLs (55 Units total) into the skin at bedtime.   10 mL   6     Patient prefers NOT to use pens   . insulin lispro (HUMALOG) 100 UNIT/ML injection   Subcutaneous   Inject 0.08-0.18 mLs (8-18 Units total) into the skin 3 (three) times daily before meals.   10 mL   6   . levothyroxine (SYNTHROID, LEVOTHROID) 75 MCG tablet      TAKE ONE (1) TABLET EACH DAY   90 tablet   1   . metoprolol (LOPRESSOR) 100 MG tablet   Oral   Take 1 tablet (100 mg total) by mouth at bedtime.   30 tablet   0   . Multiple Vitamins-Minerals (CENTRUM SILVER PO)   Oral   Take 1 tablet by mouth daily.           [name] omeprazole (PRILOSEC) 20 MG capsule      TAKE ONE CAPSULE BY MOUTH DAILY   30 capsule   6   . ondansetron (ZOFRAN) 4 MG tablet   Oral   Take 1 tablet (4 mg total) by mouth every 8 (eight) hours as needed.   90 tablet   11   . pravastatin (PRAVACHOL) 40 MG tablet      TAKE ONE (1) TABLET BY MOUTH EVERY DAY   30  tablet   6   . PROAIR  HFA 108 (90 BASE) MCG/ACT inhaler      INHALE TWO PUFFS EVERY 6 HOURS AS NEEDEDFOR WHEEZE OR SHORTNESS OF BREATH   8.5 g   3   . rOPINIRole (REQUIP) 0.5 MG tablet   Oral   Take 1 tablet (0.5 mg total) by mouth at bedtime.   30 tablet   0   . triamcinolone cream (KENALOG) 0.1 %   Topical   Apply topically 2 (two) times daily.   30 g   0   . ULTICARE INSULIN SYRINGE 31G X 5/16" 0.5 ML MISC      AS DIRECTED   100 each   6   . ZENPEP 15000 UNITS CPEP      TAKE ONE (1) CAPSULE THREE (3) TIMES EACH DAY   100 capsule   6     Allergies Citalopram hydrobromide; Diphenhydramine; Diphenhydramine hcl; Metoclopramide; Metoclopramide hcl; Paroxetine; and Tape  Family History  Problem Relation Age of Onset  . Heart disease Mother   . Heart attack Mother   . Hypertension Mother   . Heart attack Father     Social History History  Substance Use Topics  . Smoking status: Never Smoker   . Smokeless tobacco: Never Used  . Alcohol Use: No    Review of Systems Constitutional: No fever/chills Eyes: No visual changes. ENT: No sore throat. Cardiovascular: Denies chest pain. Respiratory: Denies shortness of breath. Gastrointestinal: No abdominal pain.  No nausea, no vomiting.  No diarrhea.  No constipation. Genitourinary: Negative for dysuria. Musculoskeletal: Negative for back pain. Skin: Negative for rash. Neurological: Negative for headaches, focal weakness or numbness.  10-point ROS otherwise negative.  ____________________________________________   PHYSICAL EXAM:  VITAL SIGNS: ED Triage Vitals  Enc Vitals Group     BP 03/12/15 1108 152/96 mmHg     Pulse Rate 03/12/15 1108 66     Resp --      Temp 03/12/15 1108 98.3 F (36.8 C)     Temp Source 03/12/15 1108 Oral     SpO2 03/12/15 1108 95 %     Weight 03/12/15 1108 190 lb (86.183 kg)     Height 03/12/15 1108  (1.499 m)     Head Cir --      Peak Flow --      Pain Score --      Pain Loc --      Pain  Edu? --      Excl. in GC? --     Constitutional: Alert and oriented. Well appearing and in no acute distress. Eyes: Conjunctivae are normal. PERRL. EOMI. Head: Atraumatic. Nose: No congestion/rhinnorhea. Mouth/Throat: Mucous membranes are moist.  Oropharynx non-erythematous. Neck: No stridor.  Cardiovascular: Normal rate, regular rhythm. Grossly normal heart sounds.  Good peripheral circulation. Respiratory: Normal respiratory effort.  No retractions. Lungs CTAB. Gastrointestinal: Soft and nontender. No distention. No abdominal bruits. No CVA tenderness. Rectal: maroon stool in rectal vault and diaper  Musculoskeletal: No lower extremity tenderness nor edema.  No joint effusions. Neurologic:  Normal speech and language. No gross focal neurologic deficits are appreciated. No gait instability. Skin:  Skin is warm, dry and intact. No rash noted. Psychiatric: Mood and affect are normal. Speech and behavior are normal.  ____________________________________________   LABS (all labs ordered are listed, but only abnormal results are displayed)  Labs Reviewed  COMPREHENSIVE METABOLIC PANEL - Abnormal; Notable for the following:    Chloride 100 (*)  Glucose, Bld 244 (*)    Creatinine, Ser 1.20 (*)    Calcium 8.8 (*)    ALT 6 (*)    GFR calc non Af Amer 41 (*)    GFR calc Af Amer 47 (*)    All other components within normal limits  CBC WITH DIFFERENTIAL/PLATELET  PROTIME-INR  APTT   ____________________________________________  EKG ED ECG REPORT I, Gayla Doss, the attending physician, personally viewed and interpreted this ECG.   Date: 03/12/2015  EKG Time: 11:10  Rate: 62  Rhythm: sinus rhythm with first degree AV block  Axis: normal  Intervals:none  ST&T Change: No acute ST elevation, T-wave inversions in lead aVL, V2, V3, V4, V5,  V6.  ____________________________________________  RADIOLOGY  none ____________________________________________   PROCEDURES  Procedure(s) performed: None  Critical Care performed: No  ____________________________________________   INITIAL IMPRESSION / ASSESSMENT AND PLAN / ED COURSE  Pertinent labs & imaging results that were available during my care of the patient were reviewed by me and considered in my medical decision making (see chart for details).  Mariana XARENI KELCH is a 81 y.o. female with stage III kidney disease, history of pancreatic resection, history of CVA on Eliquis, coronary artery disease who presents for evaluation of bright red blood per rectum since yesterday. On exam, she is generally well-appearing and in no acute distress. Her rectal exam is notable for maroon stool which is strongly guaiac positive. Suspect GI bleed. Given active bleeding, anticoagulation with Eliquis, will discussed with hospitalist  for admission. Labs notable for mild creatinine elevation. Hemoglobin stable. ____________________________________________   FINAL CLINICAL IMPRESSION(S) / ED DIAGNOSES  Final diagnoses:  Gastrointestinal hemorrhage with melena      Gayla Doss, MD 03/12/15 1309

## 2015-03-12 NOTE — ED Notes (Addendum)
Pt presented to ER from home via EMS. Per EMS patient had 2 bloody stools since yesterday. Pt denies hematemesis, sob, and abdominal pain. Pt is alert and oriented X4 and in NAD.

## 2015-03-12 NOTE — Telephone Encounter (Signed)
Called pt to give lab results, Pt states that she is not doing very well, she started having bright red blood in her stool which started last night, Pt denies diarrhea and denies constipation.  Advised pt to be seen at the ED.

## 2015-03-12 NOTE — Telephone Encounter (Signed)
Needing an extended order for Occupational Therapy. [facility] nurse unable to contact the patient previous order has expired.

## 2015-03-12 NOTE — H&P (Addendum)
[facility] [place] [facility]   PATIENT NAME: [name]    MR#:  [id number]  DATE OF BIRTH:  Feb 27, 1929  DATE OF ADMISSION:  03/12/2015  PRIMARY CARE PHYSICIAN: Wynona Dove, MD   REQUESTING/REFERRING PHYSICIAN: Gayla Doss, MD  CHIEF COMPLAINT:   Chief Complaint  Patient presents with  . Blood In Stools   bloody stool yesterday.  HISTORY OF PRESENT ILLNESS:  [name]  is a 81 y.o. female with a known history of hypertension, diabetes, CKD, CVA history of paroxysmal A. fib and chronic diastolic CHF. The patient was on aspirin, which was changed to Eliquis 1 months ago. She never had the GI bleeding. She had had 1 episode of bloody stool yesterday, which is about half a cup of fresh blood. She feels dizzy and weak. But she denies any abdominal pain, nausea or vomiting or diarrhea.  PAST MEDICAL HISTORY:   Past Medical History  Diagnosis Date  . Anxiety   . Chronic diastolic CHF (congestive heart failure)   . Depression   . Diabetes mellitus     Followed by Dr. Tedd Sias at [facility]  . GERD (gastroesophageal reflux disease)   . Hypertension   . Hypothyroidism   . Osteoporosis   . Allergy   . Hyperlipidemia   . RLS (restless legs syndrome)   . Pancreatic disease     pancreatic failure  . Urinary incontinence   . Pancreatitis     Pancreatic resection/ open wound- [facility] 11/04  . Pneumonia     Pneumonia/ Emphysema 05/05  . 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 TIA (transient ischemic attack)     recurrent 12/15  . History of pleural empyema   . Sepsis   . Frequent falls   . Arthritis     left knee, [facility]  . CAD (coronary artery disease), native coronary artery      1//15  T mid-LAD  stent OM2  . Parkinson's disease     PAST SURGICAL HISTORY:   Past Surgical History  Procedure Laterality Date  . Breast lumpectomy      Lumpectomy right breast  .  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 Abdominal hysterectomy      Hysteroscopy/ D & C ([place]) 12/05  . Removal of pancreas  2004  . Kyphoplasty  2005  . Broken shoulder and orbital bone  2005  . Cholecystectomy  1973  . Thoracentesis    . Vaginal delivery      7  . Cardiac catheterization  08/29/2013    x1 stent @ [facility]  . Cataract surgery      SOCIAL HISTORY:   History  Substance Use Topics  . Smoking status: Never Smoker   . Smokeless tobacco: Never Used  . Alcohol Use: No    FAMILY HISTORY:   Family History  Problem Relation Age of Onset  . Heart disease Mother   . Heart attack Mother   . Hypertension Mother   . Heart attack Father     DRUG ALLERGIES:   Allergies  Allergen Reactions  . Citalopram Hydrobromide     REACTION: nausea, dizzy, dry mouth  . Diphenhydramine     Other reaction(s): Unknown  . Diphenhydramine Hcl     REACTION: unspecified  . Metoclopramide     Other reaction(s): Unknown  . Metoclopramide Hcl     REACTION:  unspecified  . Paroxetine     REACTION: hallucinations  . Tape Other (See Comments) and Rash    REVIEW OF SYSTEMS:  CONSTITUTIONAL: No fever, has dizziness and weakness.  EYES: No blurred or double vision.  EARS, NOSE, AND THROAT: No tinnitus or ear pain.  RESPIRATORY: No cough, shortness of breath, wheezing or hemoptysis.  CARDIOVASCULAR: No chest pain, orthopnea, edema.  GASTROINTESTINAL: No nausea, vomiting, diarrhea or abdominal pain. No melena but has Bloody stool. GENITOURINARY: No dysuria, hematuria.  ENDOCRINE: No polyuria, nocturia,  HEMATOLOGY: No anemia, easy bruising or bleeding SKIN: No rash or lesion. MUSCULOSKELETAL: No joint pain or arthritis.   NEUROLOGIC: No tingling, numbness, weakness.  PSYCHIATRY: No anxiety or depression.   MEDICATIONS AT HOME:   Prior to Admission medications   Medication Sig Start Date End Date Taking? Authorizing Provider  ALPRAZolam  Prudy Feeler) 1 MG tablet TAKE 1/2 TABLET IN THE MORNING TAKE 1/2 TABLET AT LUNCH AND TAKE 1 TABLET AT BEDTIME IF NEEDED 12/02/14  Yes Shelia Media, MD  apixaban (ELIQUIS) 2.5 MG TABS tablet Take 1 tablet (2.5 mg total) by mouth 2 (two) times daily. 03/02/15  Yes Iran Ouch, MD  busPIRone (BUSPAR) 10 MG tablet TAKE ONE (1) TABLET BY MOUTH TWO (2) TIMES DAILY 12/29/14  Yes Shelia Media, MD  Carbidopa-Levodopa ER (SINEMET CR) 25-100 MG tablet controlled release Take 2 tablets by mouth 2 (two) times daily. 02/13/15  Yes Adrian Saran, MD  cetirizine (ZYRTEC) 10 MG tablet Take 10 mg by mouth daily.   Yes Historical Provider, MD  cyanocobalamin (,VITAMIN B-12,) 1000 MCG/ML injection Inject 1 mL (1,000 mcg total) into the muscle every 30 (thirty) days. 03/12/15  Yes Shelia Media, MD  fluticasone [facility]) 50 MCG/ACT nasal spray USE 2 SPRAYS EACH NOSTRIL DAILY 10/17/14  Yes Shelia Media, MD  furosemide (LASIX) 40 MG tablet Take 1 tablet (40 mg total) by mouth daily. 12/02/14  Yes Iran Ouch, MD  gentamicin ointment (GARAMYCIN) 0.1 % Apply 1 application topically 3 (three) times daily. 02/03/14  Yes Shelia Media, MD  HYDROcodone-acetaminophen (NORCO/VICODIN) 5-325 MG per tablet Take 1-2 tablets by mouth 3 (three) times daily as needed for moderate pain. 03/06/15  Yes Shelia Media, MD  insulin glargine (LANTUS) 100 UNIT/ML injection Inject 0.55 mLs (55 Units total) into the skin at bedtime. 02/10/15  Yes Shelia Media, MD  insulin lispro (HUMALOG) 100 UNIT/ML injection Inject 0.08-0.18 mLs (8-18 Units total) into the skin 3 (three) times daily before meals. 10/23/14  Yes Shelia Media, MD  levothyroxine (SYNTHROID, LEVOTHROID) 75 MCG tablet TAKE ONE (1) TABLET EACH DAY 01/21/15  Yes Shelia Media, MD  metoprolol (LOPRESSOR) 100 MG tablet Take 1 tablet (100 mg total) by mouth at bedtime. 02/13/15  Yes Adrian Saran, MD  Multiple Vitamins-Minerals (CENTRUM SILVER PO) Take 1 tablet by  mouth daily.     Yes Historical Provider, MD  omeprazole (PRILOSEC) 20 MG capsule TAKE ONE CAPSULE BY MOUTH DAILY 10/17/14  Yes Shelia Media, MD  ondansetron (ZOFRAN) 4 MG tablet Take 1 tablet (4 mg total) by mouth every 8 (eight) hours as needed. 01/18/13  Yes Shelia Media, MD  pravastatin (PRAVACHOL) 40 MG tablet TAKE ONE (1) TABLET BY MOUTH EVERY DAY 03/12/15  Yes Shelia Media, MD  PROAIR HFA 108 (90 BASE) MCG/ACT inhaler INHALE TWO PUFFS EVERY 6 HOURS AS NEEDEDFOR WHEEZE OR SHORTNESS OF BREATH 02/04/15  Yes Shelia Media, MD  rOPINIRole (REQUIP) 0.5 MG tablet Take 1 tablet (0.5 mg total) by mouth at bedtime. 02/13/15  Yes Adrian Saran, MD  triamcinolone cream (KENALOG) 0.1 % Apply topically 2 (two) times daily. 10/11/12  Yes Shelia Media, MD  ULTICARE INSULIN SYRINGE 31G X 5/16" 0.5 ML MISC AS DIRECTED 12/26/14  Yes Shelia Media, MD  ZENPEP 15000 UNITS CPEP TAKE ONE (1) CAPSULE THREE (3) TIMES EACH DAY 12/15/14  Yes Shelia Media, MD      VITAL SIGNS:  Blood pressure 152/96, pulse 66, temperature 98.3 F (36.8 C), temperature source Oral, height 4\' 11"  (1.499 m), weight 86.183 kg (190 lb), SpO2 95 %.  PHYSICAL EXAMINATION:  GENERAL:  81 y.o.-year-old patient lying in the bed with no acute distress.  EYES: Pupils equal, round, reactive to light and accommodation. No scleral icterus. Extraocular muscles intact.  HEENT: Head atraumatic, normocephalic. Oropharynx and nasopharynx clear.  NECK:  Supple, no jugular venous distention. No thyroid enlargement, no tenderness.  LUNGS: Normal breath sounds bilaterally, no wheezing, rales,rhonchi or crepitation. No use of accessory muscles of respiration.  CARDIOVASCULAR: S1, S2 normal. No murmurs, rubs, or gallops.  ABDOMEN: Soft, nontender, nondistended. Bowel sounds present. No organomegaly or mass.  EXTREMITIES: No pedal edema, cyanosis, or clubbing.  NEUROLOGIC: Cranial nerves II through XII are intact. Muscle strength 5/5  in all extremities. Sensation intact. Gait not checked.  PSYCHIATRIC: The patient is alert and oriented x 3.  SKIN: No obvious rash, lesion, or ulcer.   LABORATORY PANEL:   CBC  Recent Labs Lab 03/12/15 1113  WBC 5.6  HGB 12.4  HCT 38.3  PLT 150   ------------------------------------------------------------------------------------------------------------------  Chemistries   Recent Labs Lab 03/12/15 1113  NA 139  K 4.4  CL 100*  CO2 29  GLUCOSE 244*  BUN 18  CREATININE 1.20*  CALCIUM 8.8*  AST 27  ALT 6*  ALKPHOS 87  BILITOT 0.5   ------------------------------------------------------------------------------------------------------------------  Cardiac Enzymes No results for input(s): TROPONINI in the last 168 hours. ------------------------------------------------------------------------------------------------------------------  RADIOLOGY:  No results found.  EKG:   Orders placed or performed in visit on 03/02/15  . EKG 12-Lead    IMPRESSION AND PLAN:   GI bleeding History of proximal A. fib and CVA CAD CKD stage III Hypertension DM  The patient will be admitted to medical floor. I will keep the patient on nothing by mouth status, stop Eliquis, start Protonix IV twice a day, follow-up hemoglobin every 6 hours and GI consult. In addition, I will start gentle IV fluid support with normal saline. Hold Lasix. For hypertension, continue Lopressor. For diabetes, start sliding scale and continue Lantus. DVT prophylaxis with SCD.   All the records are reviewed and case discussed with ED provider. Management plans discussed with the patient, family and they are in agreement.  CODE STATUS: DNR TOTAL TIME TAKING CARE OF THIS PATIENT: 52 minutes.    Shaune Pollack M.D on 03/12/2015 at 1:33 PM  Between 7am to 6pm - Pager - [phone]  After 6pm go to [email or web address] - password [email or web address]  [facility]  Office  [phone]  CC: Primary  care physician; Wynona Dove, MD

## 2015-03-12 NOTE — Plan of Care (Signed)
Problem: Discharge Progression Outcomes Goal: Pain controlled with appropriate interventions Outcome: Progressing Denies pain Goal: Stools guaiac negative Outcome: Progressing Brown color stool this pm Goal: Hemodynamically stable Outcome: Progressing hgb  Stable at present.  Will have  hgb q 6 hrs to moniter results. Will moniter  Amt and character of stools Goal: Tolerating diet Outcome: Not Progressing Npo except sips and meds for now. Poor appetite at home Goal: Activity appropriate for discharge plan Outcome: Progressing Weak uses walker at home. Take vit b 12 injections Goal: Home Health Care arrangements in place Outcome: Progressing Has advanced home care at home Goal: Other Discharge Outcomes/Goals Outcome: Progressing Not sure of discharge plans  At present time

## 2015-03-13 ENCOUNTER — Encounter: Payer: Self-pay | Admitting: Radiology

## 2015-03-13 ENCOUNTER — Encounter: Payer: Self-pay | Admitting: Internal Medicine

## 2015-03-13 ENCOUNTER — Inpatient Hospital Stay: Payer: Medicare Other

## 2015-03-13 LAB — BASIC METABOLIC PANEL
ANION GAP: 7 (ref 5–15)
BUN: 13 mg/dL (ref 6–20)
CO2: 28 mmol/L (ref 22–32)
Calcium: 8.2 mg/dL — ABNORMAL LOW (ref 8.9–10.3)
Chloride: 108 mmol/L (ref 101–111)
Creatinine, Ser: 0.87 mg/dL (ref 0.44–1.00)
GFR calc Af Amer: >60 mL/min (ref >60)
GFR, EST NON AFRICAN AMERICAN: 60 mL/min — AB (ref >60)
GLUCOSE: 99 mg/dL (ref 65–99)
POTASSIUM: 3.5 mmol/L (ref 3.5–5.1)
SODIUM: 143 mmol/L (ref 135–145)

## 2015-03-13 LAB — C DIFFICILE QUICK SCREEN W PCR REFLEX
C DIFFICILE (CDIFF) INTERP: NEGATIVE
C Diff antigen: NEGATIVE
C Diff toxin: NEGATIVE

## 2015-03-13 LAB — GLUCOSE, CAPILLARY
GLUCOSE-CAPILLARY: 119 mg/dL — AB (ref 65–99)
GLUCOSE-CAPILLARY: 139 mg/dL — AB (ref 65–99)
GLUCOSE-CAPILLARY: 109 mg/dL — AB (ref 65–99)
Glucose-Capillary: 134 mg/dL — ABNORMAL HIGH (ref 65–99)

## 2015-03-13 LAB — HEMOGLOBIN
HEMOGLOBIN: 10.7 g/dL — AB (ref 12.0–16.0)
HEMOGLOBIN: 11 g/dL — AB (ref 12.0–16.0)

## 2015-03-13 MED ORDER — IOHEXOL 300 MG/ML  SOLN
100.0000 mL | Freq: Once | INTRAMUSCULAR | Status: AC | PRN
  Administered 2015-03-13: 100 mL via INTRAVENOUS

## 2015-03-13 MED ORDER — IOHEXOL 240 MG/ML SOLN
25.0000 mL | INTRAMUSCULAR | Status: AC
  Administered 2015-03-13 (×2): 25 mL via ORAL

## 2015-03-13 MED ORDER — LOPERAMIDE HCL 2 MG PO CAPS
2.0000 mg | ORAL_CAPSULE | Freq: Four times a day (QID) | ORAL | Status: DC | PRN
  Administered 2015-03-13: 2 mg via ORAL
  Filled 2015-03-13: qty 1

## 2015-03-13 NOTE — Progress Notes (Signed)
[facility] [place] at [facility]                                                                                                                                                                                            Patient Demographics   [name], is a 81 y.o. female, DOB - 1929/07/10, WUJ:[id number]  Admit date - 03/12/2015   Admitting Physician Shaune Pollack, MD  Outpatient Primary MD for the patient is Wynona Dove, MD   LOS - 1  Subjective:pt c/o abdominal pain, lower abdominal, and no diarrhea, no further bleeding     Review of Systems:   CONSTITUTIONAL: No documented fever. No fatigue, weakness. No weight gain, no weight loss.  EYES: No blurry or double vision.  ENT: No tinnitus. No postnasal drip. No redness of the oropharynx.  RESPIRATORY: No cough, no wheeze, no hemoptysis. No dyspnea.  CARDIOVASCULAR: No chest pain. No orthopnea. No palpitations. No syncope.  GASTROINTESTINAL: No nausea, no vomiting or diarrhea. + abdominal pain.  + blood stool  GENITOURINARY: No dysuria or hematuria.  ENDOCRINE: No polyuria or nocturia. No heat or cold intolerance.  HEMATOLOGY: No anemia. No bruising. No bleeding.  INTEGUMENTARY: No rashes. No lesions.  MUSCULOSKELETAL: No arthritis. No swelling. No gout.  NEUROLOGIC: No numbness, tingling, or ataxia. No seizure-type activity.  PSYCHIATRIC: No anxiety. No insomnia. No ADD.    Vitals:   Filed Vitals:   03/12/15 1330 03/12/15 1438 03/12/15 2116 03/13/15 0444  BP: 144/92 155/54 124/43 143/56  Pulse: 60 59 64 56  Temp:  98.6 F (37 C)  97.9 F (36.6 C)  TempSrc:  Oral  Oral  Resp: 13 18 18 20   Height:      Weight:      SpO2: 99% 96% 100% 100%    Wt Readings from Last 3 Encounters:  03/12/15 86.183 kg (190 lb)  03/10/15 80.457 kg (177 lb 6 oz)  03/02/15 81.194 kg (179 lb)     Intake/Output Summary (Last 24 hours) at 03/13/15 1114 Last data filed at 03/13/15 0705  Gross per 24  hour  Intake 1339.75 ml  Output    225 ml  Net 1114.75 ml    Physical Exam:   GENERAL: Pleasant-appearing in no apparent distress.  HEAD, EYES, EARS, NOSE AND THROAT: Atraumatic, normocephalic. Extraocular muscles are intact. Pupils equal and reactive to light. Sclerae anicteric. No conjunctival injection. No oro-pharyngeal erythema.  NECK: Supple. There is no jugular venous distention. No bruits, no lymphadenopathy, no thyromegaly.  HEART: Regular rate and rhythm, tachycardic. No murmurs, no rubs, no clicks.  LUNGS: Clear to auscultation bilaterally.  No rales or rhonchi. No wheezes.  ABDOMEN:  + tenderness, no guarding, + bs x 4, has abdonal dressing from old wound EXTREMITIES: No evidence of any cyanosis, clubbing, or peripheral edema.  +2 pedal and radial pulses bilaterally.  NEUROLOGIC: The patient is alert, awake, and oriented x3 with no focal motor or sensory deficits appreciated bilaterally.  SKIN: Moist and warm with no rashes appreciated.  Psych: Not anxious, depressed LN: No inguinal LN enlargement    Antibiotics   Anti-infectives    None      Medications   Scheduled Meds: . antiseptic oral rinse  7 mL Mouth Rinse BID  . busPIRone  10 mg Oral BID  . Carbidopa-Levodopa ER  2 tablet Oral BID  . insulin aspart  0-5 Units Subcutaneous QHS  . insulin aspart  0-9 Units Subcutaneous TID WC  . insulin glargine  55 Units Subcutaneous QHS  . levothyroxine  75 mcg Oral QAC breakfast  . loratadine  10 mg Oral Daily  . metoprolol  100 mg Oral QHS  . pantoprazole (PROTONIX) IV  40 mg Intravenous Q12H  . rOPINIRole  0.5 mg Oral QHS   Continuous Infusions: . sodium chloride 75 mL/hr at 03/13/15 0705   PRN Meds:.acetaminophen **OR** acetaminophen, albuterol, ALPRAZolam, HYDROcodone-acetaminophen, ondansetron **OR** ondansetron (ZOFRAN) IV   Data Review:   Micro Results No results found for this or any previous visit (from the past 240 hour(s)).  Radiology Reports Ct  Head Wo Contrast  02/12/2015   CLINICAL DATA:  Initial evaluation for acute right face/ upper extremity weakness  EXAM: CT HEAD WITHOUT CONTRAST  TECHNIQUE: Contiguous axial images were obtained from the base of the skull through the vertex without intravenous contrast.  COMPARISON:  Prior study from 07/30/2014  FINDINGS: Diffuse prominence of the CSF containing spaces is compatible with generalized cerebral atrophy. Chronic small vessel ischemic changes present within the periventricular and deep white matter both cerebral hemispheres. Scattered small remote infarcts present within the right frontoparietal region. Small remote right cerebellar infarcts also noted. Scattered vascular calcifications present within the carotid siphons and distal left vertebral artery.  No acute large vessel territory infarct identified. Gray-white matter differentiation is maintained. No acute intracranial hemorrhage.  No mass lesion, mass effect, or midline shift. No hydrocephalus. No extra-axial fluid collection.  Scalp soft tissues within normal limits. No acute abnormality about the orbits.  Calvarium intact.  Mild layering opacity present within the lateral recess of the left sphenoid sinus. Paranasal sinuses are otherwise largely clear. No mastoid effusion.  IMPRESSION: 1. No acute intracranial process identified. 2. Generalized cerebral atrophy with chronic microvascular ischemic disease and remote infarcts involving the right frontoparietal region and right cerebellar hemisphere.   Electronically Signed   By: Rise Mu M.D.   On: 02/12/2015 03:32   Mr Brain Wo Contrast  02/12/2015   CLINICAL DATA:  81 year old diabetic hypertensive female post fall last night hitting head on sofa. Slurred speech yesterday. No reported loss of consciousness. Weakness left arm and leg. Subsequent encounter.  EXAM: MRI HEAD WITHOUT CONTRAST  TECHNIQUE: Multiplanar, multiecho pulse sequences of the brain and surrounding structures  were obtained without intravenous contrast.  COMPARISON:  02/12/2015 head CT.  07/30/2014 brain MR.  FINDINGS: No acute infarct.  Remote infarcts right periatrial/ right parietal region. Remote tiny cerebellar infarcts. Remote tiny right thalamic and basal ganglia infarcts.  Mild small vessel disease type changes.  No intracranial hemorrhage.  Global atrophy without hydrocephalus.  No intracranial mass lesion noted on  this unenhanced exam.  Major intracranial vascular structures are patent. Ectatic vertebral arteries and basilar artery with atherosclerotic type changes of the vertebral arteries suspected.  Transverse ligament hypertrophy. Cervical medullary junction, pituitary region and pineal region unremarkable. Post lens replacement otherwise orbital structures unremarkable.  IMPRESSION: No acute infarct.  Remote infarcts right periatrial/ right parietal region. Remote tiny cerebellar infarcts. Remote tiny right thalamic and basal ganglia infarcts.  Mild small vessel disease type changes.  No intracranial hemorrhage.  Global atrophy.  Ectatic vertebral arteries and basilar artery with atherosclerotic type changes of the vertebral arteries suspected.   Electronically Signed   By: Lacy Duverney M.D.   On: 02/12/2015 13:54   Dg Esophagus  02/12/2015   ADDENDUM REPORT: 02/12/2015 14:43  ADDENDUM: I have been informed by speech pathologist that the correct history is that the patient is at risk for aspiration and does not have a history of documented aspiration.   Electronically Signed   By: Lacy Duverney M.D.   On: 02/12/2015 14:43   02/12/2015   CLINICAL DATA:  81 year old female with episodes of silent aspiration. Initial encounter.  EXAM: ESOPHOGRAM/BARIUM SWALLOW  TECHNIQUE: Single contrast examination was performed using  thin barium.  FLUOROSCOPY TIME:  Radiation Exposure Index (as provided by the fluoroscopic device): 86.77 micro Gy cm2  COMPARISON:  None.  FINDINGS: Per speech pathologist, patient  aspirates and it was requested that the examination be performed with the patient in the upright position. Patient was not able to stand on her own and therefore examination was performed with patient in a 45 degree head off position.  With ingestion of a single mouthful of contrast, no esophageal obstructing obstructing lesion is noted however, there is poor esophageal motility with the patient having difficulty clearing the single ingested mouthful of contrast. Examination was terminated this point. Limited for evaluating for mucosal abnormality.  Post cement augmentation lower thoracic vertebra.  IMPRESSION: Limited examination with poor esophageal motility as noted above. Patient may be safe for p.o. intake.  Electronically Signed: By: Lacy Duverney M.D. On: 02/12/2015 14:22   US Carotid Bilateral  02/12/2015   CLINICAL DATA:  History of hypertension, TIA, syncopal episode, CAD, hyperlipidemia and diabetes.  EXAM: BILATERAL CAROTID DUPLEX ULTRASOUND  TECHNIQUE: Wallace Cullens scale imaging, color Doppler and duplex ultrasound were performed of bilateral carotid and vertebral arteries in the neck.  COMPARISON:  Carotid Doppler ultrasound - 07/29/2014  FINDINGS: Criteria: Quantification of carotid stenosis is based on velocity parameters that correlate the residual internal carotid diameter with NASCET-based stenosis levels, using the diameter of the distal internal carotid lumen as the denominator for stenosis measurement.  The following velocity measurements were obtained:  RIGHT  ICA:  86/30 cm/sec  CCA:  81/16 cm/sec  SYSTOLIC ICA/CCA RATIO:  1.1  DIASTOLIC ICA/CCA RATIO:  1.96  ECA:  122 cm/sec  LEFT  ICA:  77/32 cm/sec  CCA:  96/19 cm/sec  SYSTOLIC ICA/CCA RATIO:  0.81  DIASTOLIC ICA/CCA RATIO:  1.65  ECA:  108 cm/sec  RIGHT CAROTID ARTERY: There is a minimal amount of eccentric mixed echogenic partially shadowing plaque within the right carotid bulb (image 16 and 18), extending to involve the origin and proximal  aspect the right internal carotid artery (image 24), morphologically similar to the 07/2014 examination and again not resulting in elevated peak systolic velocities within the interrogated course of the right internal carotid artery to suggest a hemodynamically significant stenosis.  RIGHT VERTEBRAL ARTERY:  Antegrade flow  LEFT CAROTID ARTERY: There  is a moderate amount of eccentric mixed echogenic plaque within the left carotid bulb (image 39 and 50), extending to involve the origin and proximal aspect the left internal carotid artery (image 58), likely progressed since the 07/2014 examination though again not resulting in elevated peak systolic velocities within the interrogated course of the left internal carotid artery to suggest a hemodynamically significant stenosis.  LEFT VERTEBRAL ARTERY:  Antegrade flow  IMPRESSION: 1. Suspected progression of now moderate amount of left-sided atherosclerotic plaque though again not resulting in a hemodynamically significant stenosis. 2. Grossly unchanged minimal amount of right-sided atherosclerotic plaque, not resulting in a hemodynamically significant stenosis.   Electronically Signed   By: Simonne Come M.D.   On: 02/12/2015 15:40     CBC  Recent Labs Lab 03/10/15 1702 03/12/15 1113 03/12/15 1514 03/12/15 2028 03/13/15 0336 03/13/15 0819  WBC 6.3 5.6  --   --   --   --   HGB 12.3 12.4 11.9* 11.4* 10.7* 11.0*  HCT 37.7 38.3  --   --   --   --   PLT 151.0 150  --   --   --   --   MCV 91.6 91.4  --   --   --   --   MCH  --  29.5  --   --   --   --   MCHC 32.7 32.3  --   --   --   --   RDW 13.9 13.9  --   --   --   --   LYMPHSABS  --  1.7  --   --   --   --   MONOABS  --  0.5  --   --   --   --   EOSABS  --  0.1  --   --   --   --   BASOSABS  --  0.0  --   --   --   --     Chemistries   Recent Labs Lab 03/10/15 1702 03/12/15 1113 03/13/15 0336  NA 142 139 143  K 5.0 4.4 3.5  CL 104 100* 108  CO2 28 29 28   GLUCOSE 92 244* 99  BUN 21 18  13   CREATININE 1.06 1.20* 0.87  CALCIUM 9.3 8.8* 8.2*  AST 19 27  --   ALT 6 6*  --   ALKPHOS 80 87  --   BILITOT 0.3 0.5  --    ------------------------------------------------------------------------------------------------------------------ estimated creatinine clearance is 46.7 mL/min (by C-G formula based on Cr of 0.87). ------------------------------------------------------------------------------------------------------------------ No results for input(s): HGBA1C in the last 72 hours. ------------------------------------------------------------------------------------------------------------------ No results for input(s): CHOL, HDL, LDLCALC, TRIG, CHOLHDL, LDLDIRECT in the last 72 hours. ------------------------------------------------------------------------------------------------------------------  Recent Labs  03/10/15 1702  TSH 0.49   ------------------------------------------------------------------------------------------------------------------  Recent Labs  03/10/15 1702  VITAMINB12 295  FERRITIN 8.8*    Coagulation profile  Recent Labs Lab 03/12/15 1113  INR 1.11    No results for input(s): DDIMER in the last 72 hours.  Cardiac Enzymes No results for input(s): CKMB, TROPONINI, MYOGLOBIN in the last 168 hours.  Invalid input(s): CK ------------------------------------------------------------------------------------------------------------------ Invalid input(s): POCBNP    Assessment & Plan   GI bleeding likely related to Eliquis, also now having abdominal pain need to rule out colitis, hemoglobin is stable. We will obtain a CT of the abdomen and pelvis obtained stool studies due to diarrhea. History of proximal A. fib and CVA Eliquis on hold due to concern for  GI bleed continue metoprolol CAD resume aspirin once stable from GI bleeding standpoint CKD stage III monitor renal function Hypertension blood pressure stable continue metoprolol DM2;  sliding scale insulin and Lantus at bedtime     Code Status Orders        Start     Ordered   03/12/15 1431  Do not attempt resuscitation (DNR)   Continuous    Question Answer Comment  In the event of cardiac or respiratory ARREST Do not call a "code blue"   In the event of cardiac or respiratory ARREST Do not perform Intubation, CPR, defibrillation or ACLS   In the event of cardiac or respiratory ARREST Use medication by any route, position, wound care, and other measures to relive pain and suffering. May use oxygen, suction and manual treatment of airway obstruction as needed for comfort.      03/12/15 1430    Advance Directive Documentation        Most Recent Value   Type of Advance Directive  Healthcare Power of Attorney   Pre-existing out of facility DNR order (yellow form or pink MOST form)     "MOST" Form in Place?             Consults  gastroenterology DVT Prophylaxis   SCDs   Lab Results  Component Value Date   PLT 150 03/12/2015     Time Spent in minutes    35 minute Greater than 50% of time spent in care coordination and counseling with patient.   Auburn Bilberry M.D on 03/13/2015 at 11:14 AM  Between 7am to 6pm - Pager - [phone]  After 6pm go to [email or web address] - password [email or web address] [facility]  [facility]   Office  [phone]

## 2015-03-13 NOTE — Telephone Encounter (Signed)
Please see below.

## 2015-03-13 NOTE — Consult Note (Signed)
Pt without further bleeding.  CT scan did not show any colonic abnormality that would account for the bleeding and it has stopped off the Eliquis.  I will leave it to the Hospitalist as to restarting it or not or switching to ASA 3m or .  I will sign off, Dr. Bluford Kaufmann is on this weekend.

## 2015-03-13 NOTE — Plan of Care (Signed)
Problem: Discharge Progression Outcomes Goal: Other Discharge Outcomes/Goals Outcome: Progressing Plan of care progress to goal:  No signs of bleeding at this time. Patient having frequent episodes of diarrhea - negative for C. Diff. Imodium given once. C/o nausea once, Zofran given with relief. CT Scan negative for indication of bleeding. NS infusing as ordered. Up to [facility] with one person assist. Family at bedside.

## 2015-03-13 NOTE — Telephone Encounter (Signed)
That is fine 

## 2015-03-13 NOTE — Care Management (Addendum)
Admitted to [facility] with the diagnosis of GI Bleed. Lives alone. Nursing assistant come for 2 hours a day x 10 years. The assistant helps with the bath and personal care.  Also, has Home Health for dressing changes, doesn't remember name of agency. [facility] for skilled nursing about 10 years ago. Uses a rolling walker to aid in ambulation. [facility] in the home.Sees Dr. Ronna Polio, last seen July 19th. Daughter-in-law is Berenis Corter [phone]). Daughter is [name] [phone]). Son, Mellody Dance will transport Received telephone call from Donnal Debar, RN representative for [facility], physical therapy and occupational therapy. (Occupational therapy hasn't started their services) Gwenette Greet RN MSN Care Management [phone]

## 2015-03-13 NOTE — Plan of Care (Signed)
Problem: Discharge Progression Outcomes Goal: Discharge plan in place and appropriate Individualization of Care Pt prefers to be called [name] Pt with history of known history of hypertension, diabetes, CKD, CVA history of paroxysmal A. fib and chronic diastolic CHF on home medications Pt with history of pancreatic surgery >12 years ago with large abdominal unhealing wound HIGH fall precautions.  Goal: Other Discharge Outcomes/Goals Plan of Care Progress to Goal:  Pt without c/o, pt wound with foam dressing intact, GI consult completed, started on clear liquids per MD orders, tolerating diet well, no n/v/d noted, no evidence of GI bleed at this time.

## 2015-03-14 LAB — GLUCOSE, CAPILLARY
GLUCOSE-CAPILLARY: 109 mg/dL — AB (ref 65–99)
Glucose-Capillary: 76 mg/dL (ref 65–99)

## 2015-03-14 MED ORDER — CLOPIDOGREL BISULFATE 75 MG PO TABS: 75.0000 mg | ORAL_TABLET | Freq: Every day | ORAL | Status: DC

## 2015-03-14 NOTE — Discharge Summary (Signed)
[facility] [place] at [facility]   PATIENT NAME: [name]    MR#:  [id number]  DATE OF BIRTH:  04/21/1929  DATE OF ADMISSION:  03/12/2015 ADMITTING PHYSICIAN: Shaune Pollack, MD  DATE OF DISCHARGE: 03/14/2015  PRIMARY CARE PHYSICIAN: Wynona Dove, MD    ADMISSION DIAGNOSIS:  Gastrointestinal hemorrhage with melena [K92.1]  DISCHARGE DIAGNOSIS:  Active Problems:   GIB (gastrointestinal bleeding)   SECONDARY DIAGNOSIS:   Past Medical History  Diagnosis Date  . Anxiety   . Chronic diastolic CHF (congestive heart failure)   . Depression   . Diabetes mellitus     Followed by Dr. Tedd Sias at [facility]  . GERD (gastroesophageal reflux disease)   . Hypertension   . Hypothyroidism   . Osteoporosis   . Allergy   . Hyperlipidemia   . RLS (restless legs syndrome)   . Pancreatic disease     pancreatic failure  . Urinary incontinence   . Pancreatitis     Pancreatic resection/ open wound- [facility] 11/04  . Pneumonia     Pneumonia/ Emphysema 05/05  . 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 TIA (transient ischemic attack)     recurrent 12/15  . History of pleural empyema   . Sepsis   . Frequent falls   . Arthritis     left knee, [facility]  . CAD (coronary artery disease), native coronary artery      1//15  T mid-LAD  stent OM2  . Parkinson's disease     HOSPITAL COURSE:   1. Acute lower gastrointestinal hemorrhage. Patient's Eliquis was stopped. Patient's hemoglobin remained relatively stable. Last hemoglobin 11.0. Patient's bleeding has stopped. Patient stable for discharge home. Patient was seen by gastroenterology and no procedure scheduled. 2. Paroxysmal atrial fibrillation with history of stroke. I discussed benefits and risks of anticoagulation with family and patient. Family concerned about her bleeding from abdominal wound, falls and now GI bleed. We decided to stop the Eliquis  anticoagulation. Patient will go back on her Plavix for stroke prevention. Patient is at increased risk of stroke with her atrial fibrillation. 3. Hypertension- blood pressure borderline today. 4. Hypothyroidism unspecified continue levothyroxine. 5. Diabetes type 2 controlled 6. Anxiety depression continue usual medications  DISCHARGE CONDITIONS:   Satisfactory  CONSULTS OBTAINED:  Treatment Team:  Scot Jun, MD  DRUG ALLERGIES:   Allergies  Allergen Reactions  . Citalopram Hydrobromide Nausea And Vomiting and Other (See Comments)    Reaction:  Dizziness and dry mouth   . Diphenhydramine Other (See Comments)    Reaction:  Unknown   . Metoclopramide Other (See Comments)    Reaction:  Unknown   . Paroxetine Other (See Comments)    Reaction:  Hallucinations   . Tape Rash    DISCHARGE MEDICATIONS:   Current Discharge Medication List    START taking these medications   Details  clopidogrel (PLAVIX) 75 MG tablet Take 1 tablet (75 mg total) by mouth daily. Qty: 30 tablet, Refills: 0      CONTINUE these medications which have NOT CHANGED   Details  albuterol (PROVENTIL HFA;VENTOLIN HFA) 108 (90 BASE) MCG/ACT inhaler Inhale 2 puffs into the lungs every 6 (six) hours as needed for wheezing or shortness of breath.    ALPRAZolam (XANAX) 1 MG tablet Take 0.5-1 mg by mouth 3 (three) times daily as needed for anxiety. Pt takes one-half tablet in the morning, one-half tablet at  lunch, and one tablet at bedtime if needed.    busPIRone (BUSPAR) 10 MG tablet Take 10 mg by mouth 2 (two) times daily.    Carbidopa-Levodopa ER (SINEMET CR) 25-100 MG tablet controlled release Take 2 tablets by mouth 2 (two) times daily. Qty: 60 tablet, Refills: 0    cetirizine (ZYRTEC) 10 MG tablet Take 10 mg by mouth daily.    fluticasone (FLONASE) 50 MCG/ACT nasal spray Place 2 sprays into both nostrils daily.    furosemide (LASIX) 40 MG tablet Take 1 tablet (40 mg total) by mouth daily. Qty:  90 tablet, Refills: 3    gentamicin ointment (GARAMYCIN) 0.1 % Apply 1 application topically 3 (three) times daily. Qty: 30 g, Refills: 0   Associated Diagnoses: Open wound of right great toe    HYDROcodone-acetaminophen (NORCO/VICODIN) 5-325 MG per tablet Take 1-2 tablets by mouth 3 (three) times daily as needed for moderate pain. Qty: 60 tablet, Refills: 0   Associated Diagnoses: Osteoarthrosis, unspecified whether generalized or localized, involving lower leg    insulin glargine (LANTUS) 100 UNIT/ML injection Inject 0.55 mLs (55 Units total) into the skin at bedtime. Qty: 10 mL, Refills: 6    insulin lispro (HUMALOG) 100 UNIT/ML injection Inject 0.08-0.18 mLs (8-18 Units total) into the skin 3 (three) times daily before meals. Qty: 10 mL, Refills: 6    levothyroxine (SYNTHROID, LEVOTHROID) 75 MCG tablet Take 75 mcg by mouth daily.    metoprolol (LOPRESSOR) 100 MG tablet Take 1 tablet (100 mg total) by mouth at bedtime. Qty: 30 tablet, Refills: 0    Multiple Vitamins-Minerals (CENTRUM SILVER PO) Take 1 tablet by mouth daily.      omeprazole (PRILOSEC) 20 MG capsule Take 20 mg by mouth daily.    ondansetron (ZOFRAN) 4 MG tablet Take 1 tablet (4 mg total) by mouth every 8 (eight) hours as needed. Qty: 90 tablet, Refills: 11    Pancrelipase, Lip-Prot-Amyl, (ZENPEP) 15000 UNITS CPEP Take 1 capsule by mouth 3 (three) times daily.    pravastatin (PRAVACHOL) 40 MG tablet Take 40 mg by mouth daily.    rOPINIRole (REQUIP) 0.5 MG tablet Take 1 tablet (0.5 mg total) by mouth at bedtime. Qty: 30 tablet, Refills: 0    triamcinolone cream (KENALOG) 0.1 % Apply topically 2 (two) times daily. Qty: 30 g, Refills: 0   Associated Diagnoses: Dermatitis      STOP taking these medications     apixaban (ELIQUIS) 2.5 MG TABS tablet          DISCHARGE INSTRUCTIONS:   Follow-up with Dr. Ronna Polio in 1 week.  If you experience worsening of your admission symptoms, develop shortness  of breath, life threatening emergency, suicidal or homicidal thoughts you must seek medical attention immediately by calling 911 or calling your MD immediately  if symptoms less severe.  You Must read complete instructions/literature along with all the possible adverse reactions/side effects for all the Medicines you take and that have been prescribed to you. Take any new Medicines after you have completely understood and accept all the possible adverse reactions/side effects.   Please note  You were cared for by a hospitalist during your hospital stay. If you have any questions about your discharge medications or the care you received while you were in the hospital after you are discharged, you can call the unit and asked to speak with the hospitalist on call if the hospitalist that took care of you is not available. Once you are discharged, your primary care  physician will handle any further medical issues. Please note that NO REFILLS for any discharge medications will be authorized once you are discharged, as it is imperative that you return to your primary care physician (or establish a relationship with a primary care physician if you do not have one) for your aftercare needs so that they can reassess your need for medications and monitor your lab values.    Today   CHIEF COMPLAINT:   Chief Complaint  Patient presents with  . Blood In Stools    HISTORY OF PRESENT ILLNESS:  Khai Torbert  is a 81 y.o. female with a known history of atrial fibrillation and history of stroke came in with a lower GI bleed.   VITAL SIGNS:  Blood pressure 143/56, pulse 55, temperature 97.7 F (36.5 C), temperature source Oral, resp. rate 16, height  (1.499 m), weight 86.183 kg (190 lb), SpO2 98 %.  I/O:   Intake/Output Summary (Last 24 hours) at 03/14/15 0826 Last data filed at 03/14/15 0749  Gross per 24 hour  Intake    955 ml  Output    300 ml  Net    655 ml    PHYSICAL EXAMINATION:   GENERAL:  81 y.o.-year-old patient lying in the bed with no acute distress.  EYES: Pupils equal, round, reactive to light and accommodation. No scleral icterus. Extraocular muscles intact.  HEENT: Head atraumatic, normocephalic. Oropharynx and nasopharynx clear.  NECK:  Supple, no jugular venous distention. No thyroid enlargement, no tenderness.  LUNGS: Normal breath sounds bilaterally, no wheezing, rales,rhonchi or crepitation. No use of accessory muscles of respiration.  CARDIOVASCULAR: S1, S2 normal. No murmurs, rubs, or gallops.  ABDOMEN: Soft, non-tender, non-distended. Bowel sounds present. No organomegaly or mass.  EXTREMITIES: Trace edema, no cyanosis, or clubbing.  NEUROLOGIC: Cranial nerves II through XII are intact. Muscle strength 5/5 in all extremities. Sensation intact. Gait not checked.  PSYCHIATRIC: The patient is alert and oriented x 3.  SKIN: Scarring over the abdominal wound as per family this as sometimes bleeds.  DATA REVIEW:   CBC  Recent Labs Lab 03/12/15 1113  03/13/15 0819  WBC 5.6  --   --   HGB 12.4  < > 11.0*  HCT 38.3  --   --   PLT 150  --   --   < > = values in this interval not displayed.  Chemistries   Recent Labs Lab 03/12/15 1113 03/13/15 0336  NA 139 143  K 4.4 3.5  CL 100* 108  CO2 29 28  GLUCOSE 244* 99  BUN 18 13  CREATININE 1.20* 0.87  CALCIUM 8.8* 8.2*  AST 27  --   ALT 6*  --   ALKPHOS 87  --   BILITOT 0.5  --     Microbiology Results  Results for orders placed or performed during the hospital encounter of 03/12/15  C difficile quick scan w PCR reflex ([facility] only)     Status: None   Collection Time: 03/13/15 10:27 AM  Result Value Ref Range Status   C Diff antigen NEGATIVE NEGATIVE Final   C Diff toxin NEGATIVE NEGATIVE Final   C Diff interpretation Negative for C. difficile  Final    RADIOLOGY:  Ct Abdomen Pelvis W Contrast  03/13/2015   CLINICAL DATA:  GI bleed since 03/11/2015, likely related to Eliquis, now with  bilateral lower abdominal/ pelvic pain. Stable hemoglobin. Evaluate for colitis. Diarrhea.  EXAM: CT ABDOMEN AND PELVIS WITH CONTRAST  TECHNIQUE:  Multidetector CT imaging of the abdomen and pelvis was performed using the standard protocol following bolus administration of intravenous contrast.  CONTRAST:  OMNIPAQUE IOHEXOL 300 MG/ML  SOLN  COMPARISON:  CT abdomen pelvis dated 04/26/2013.  FINDINGS: Lower chest:  Trace right pleural fluid/thickening, chronic.  Hepatobiliary: Liver is within normal limits. No suspicious/enhancing hepatic lesions.  Status post cholecystectomy. No intrahepatic or extrahepatic ductal dilatation. Pneumobilia.  Pancreas: Status post partial pancreatectomy with resection of the pancreatic body/ tail. Residual pancreatic head/uncinate process are remarkable.  Spleen: Geographic hypoenhancement involving the lateral spleen (series 2/image 21), although almost completely improved on delayed imaging delayed imaging, suggesting a perfusion anomaly.  Adrenals/Urinary Tract: Adrenal glands are within normal limits.  3.1 cm lateral right upper pole renal cyst (series 2/ image 21). Bilateral renal cortical atrophy. No hydronephrosis.  Bladder is within normal limits.  Stomach/Bowel: Stomach is within normal limits.  No evidence of bowel obstruction.  Left colon is decompressed. No colonic wall thickening or inflammatory changes.  Vascular/Lymphatic: Atherosclerotic calcifications of the abdominal aorta and branch vessels.  Suspected chronic splenic vein occlusion with collateralization (for example, series 2/image 26). Main portal vein is patent (series 2/image 18).  No suspicious abdominopelvic lymphadenopathy.  Reproductive: Uterus is within normal limits.  Bilateral ovaries are within normal limits.  Other: No abdominopelvic ascites.  Postsurgical changes with laxity of the anterior abdominal wall.  Musculoskeletal: Degenerative changes of the visualized thoracolumbar spine.  Prior  vertebral augmentation involving T10 and T11. Stable inferior endplate compression fracture deformity at T12.  IMPRESSION: No evidence of bowel obstruction. Left colon is decompressed. No colonic wall thickening/ inflammatory changes to suggest colitis.  Status post distal pancreatectomy. Additional postsurgical changes in the abdomen/pelvis, as above.  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  Additional ancillary findings as above.   Electronically Signed   By: Charline Bills M.D.   On: 03/13/2015 13:42    Management plans discussed with the patient, family and they are in agreement.  CODE STATUS:     Code Status Orders        Start     Ordered   03/12/15 1431  Do not attempt resuscitation (DNR)   Continuous    Question Answer Comment  In the event of cardiac or respiratory ARREST Do not call a "code blue"   In the event of cardiac or respiratory ARREST Do not perform Intubation, CPR, defibrillation or ACLS   In the event of cardiac or respiratory ARREST Use medication by any route, position, wound care, and other measures to relive pain and suffering. May use oxygen, suction and manual treatment of airway obstruction as needed for comfort.      03/12/15 1430    Advance Directive Documentation        Most Recent Value   Type of Advance Directive  Healthcare Power of Attorney   Pre-existing out of facility DNR order (yellow form or pink MOST form)     "MOST" Form in Place?        TOTAL TIME TAKING CARE OF THIS PATIENT: 45 minutes.    Alford Highland M.D on 03/14/2015 at 8:26 AM  Between 7am to 6pm - Pager - [phone]  After 6pm go to [email or web address] - password [email or web address] [facility]  [place] [facility]  Office  [phone]  CC: Primary care physician; Wynona Dove, MD

## 2015-03-14 NOTE — Progress Notes (Signed)
   03/14/15 1100  Clinical Encounter Type  Visited With Patient  Visit Type Follow-up  Spiritual Encounters  Spiritual Needs Prayer  Stress Factors  Patient Stress Factors None identified  Chaplain followed up with patient and patient informed her that she was being discharged today. She was very direct with  keeping her same home aid and continuing her daily living with no disruptions in her routine.  Chaplain Colleene Swarthout [phone]

## 2015-03-14 NOTE — Plan of Care (Signed)
Problem: Discharge Progression Outcomes Goal: Other Discharge Outcomes/Goals Outcome: Progressing Plan of care progress to goal:   Individualization of Care  No signs of bleeding this shift.  Patient had no complaints of diarrhea this shift - currently negative for C. Dif.  Patient also without complaints of nausea.  NS infusing at 75 mL/hr.  Patient up to [facility] with one person assist.  Patient requested Xanax and Requip at hr for anxiety and inability to sleep.

## 2015-03-14 NOTE — Progress Notes (Signed)
All goals met, Patient ready for discharge.  All paperwork reviewed and signed by patient, IV removed and Patient discharged home with Daughter, home health will resume.

## 2015-03-14 NOTE — Discharge Instructions (Signed)
Gastrointestinal Bleeding Gastrointestinal (GI) bleeding means there is bleeding somewhere along the digestive tract, between the mouth and anus. CAUSES  There are many different problems that can cause GI bleeding. Possible causes include:  Esophagitis. This is inflammation, irritation, or swelling of the esophagus.  Hemorrhoids.These are veins that are full of blood (engorged) in the rectum. They cause pain, inflammation, and may bleed.  Anal fissures.These are areas of painful tearing which may bleed. They are often caused by passing hard stool.  Diverticulosis.These are pouches that form on the colon over time, with age, and may bleed significantly.  Diverticulitis.This is inflammation in areas with diverticulosis. It can cause pain, fever, and bloody stools, although bleeding is rare.  Polyps and cancer. Colon cancer often starts out as precancerous polyps.  Gastritis and ulcers.Bleeding from the upper gastrointestinal tract (near the stomach) may travel through the intestines and produce black, sometimes tarry, often bad smelling stools. In certain cases, if the bleeding is fast enough, the stools may not be black, but red. This condition may be life-threatening. SYMPTOMS   Vomiting bright red blood or material that looks like coffee grounds.  Bloody, black, or tarry stools. DIAGNOSIS  Your caregiver may diagnose your condition by taking your history and performing a physical exam. More tests may be needed, including:  X-rays and other imaging tests.  Esophagogastroduodenoscopy (EGD). This test uses a flexible, lighted tube to look at your esophagus, stomach, and small intestine.  Colonoscopy. This test uses a flexible, lighted tube to look at your colon. TREATMENT  Treatment depends on the cause of your bleeding.   For bleeding from the esophagus, stomach, small intestine, or colon, the caregiver doing your EGD or colonoscopy may be able to stop the bleeding as part of  the procedure.  Inflammation or infection of the colon can be treated with medicines.  Many rectal problems can be treated with creams, suppositories, or warm baths.  Surgery is sometimes needed.  Blood transfusions are sometimes needed if you have lost a lot of blood. If bleeding is slow, you may be allowed to go home. If there is a lot of bleeding, you will need to stay in the hospital for observation. HOME CARE INSTRUCTIONS   Take any medicines exactly as prescribed.  Keep your stools soft by eating foods that are high in fiber. These foods include whole grains, legumes, fruits, and vegetables. Prunes (1 to 3 a day) work well for many people.  Drink enough fluids to keep your urine clear or pale yellow. SEEK IMMEDIATE MEDICAL CARE IF:   Your bleeding increases.  You feel lightheaded, weak, or you faint.  You have severe cramps in your back or abdomen.  You pass large blood clots in your stool.  Your problems are getting worse. MAKE SURE YOU:   Understand these instructions.  Will watch your condition.  Will get help right away if you are not doing well or get worse. Document Released: 08/05/2000 Document Revised: 07/25/2012 Document Reviewed: 07/18/2011 [facility] Patient Information 2015 [place], [place]. This information is not intended to replace advice given to you by your health care provider. Make sure you discuss any questions you have with your health care provider.  Bloody Stools Bloody stools means there is blood in your poop (stool). It is a sign that there is a problem somewhere in the digestive system. It is important for your doctor to find the cause of your bleeding, so the problem can be treated.  HOME CARE  Only take  medicine as told by your doctor.  Eat foods with fiber (prunes, bran cereals).  Drink enough fluids to keep your pee (urine) clear or pale yellow.  Sit in warm water (sitz bath) for 10 to 15 minutes as told by your doctor.  Know how to  take your medicines (enemas, suppositories) if advised by your doctor.  Watch for signs that you are getting better or getting worse. GET HELP RIGHT AWAY IF:   You are not getting better.  You start to get better but then get worse again.  You have new problems.  You have severe bleeding from the place where poop comes out (rectum) that does not stop.  You throw up (vomit) blood.  You feel weak or pass out (faint).  You have a fever. MAKE SURE YOU:   Understand these instructions.  Will watch your condition.  Will get help right away if you are not doing well or get worse. Document Released: 07/27/2009 Document Revised: 10/31/2011 Document Reviewed: 12/24/2010 [facility] Patient Information 2015 [employer], [place]. This information is not intended to replace advice given to you by your health care provider. Make sure you discuss any questions you have with your health care provider.  Abdominal Pain, Women Abdominal (stomach, pelvic, or belly) pain can be caused by many things. It is important to tell your doctor:  The location of the pain.  Does it come and go or is it present all the time?  Are there things that start the pain (eating certain foods, exercise)?  Are there other symptoms associated with the pain (fever, nausea, vomiting, diarrhea)? All of this is helpful to know when trying to find the cause of the pain. CAUSES   Stomach: virus or bacteria infection, or ulcer.  Intestine: appendicitis (inflamed appendix), regional ileitis (Crohn's disease), ulcerative colitis (inflamed colon), irritable bowel syndrome, diverticulitis (inflamed diverticulum of the colon), or cancer of the stomach or intestine.  Gallbladder disease or stones in the gallbladder.  Kidney disease, kidney stones, or infection.  Pancreas infection or cancer.  Fibromyalgia (pain disorder).  Diseases of the female organs:  Uterus: fibroid (non-cancerous) tumors or infection.  Fallopian  tubes: infection or tubal pregnancy.  Ovary: cysts or tumors.  Pelvic adhesions (scar tissue).  Endometriosis (uterus lining tissue growing in the pelvis and on the pelvic organs).  Pelvic congestion syndrome (female organs filling up with blood just before the menstrual period).  Pain with the menstrual period.  Pain with ovulation (producing an egg).  Pain with an IUD (intrauterine device, birth control) in the uterus.  Cancer of the female organs.  Functional pain (pain not caused by a disease, may improve without treatment).  Psychological pain.  Depression. DIAGNOSIS  Your doctor will decide the seriousness of your pain by doing an examination.  Blood tests.  X-rays.  Ultrasound.  CT scan (computed tomography, special type of X-ray).  MRI (magnetic resonance imaging).  Cultures, for infection.  Barium enema (dye inserted in the large intestine, to better view it with X-rays).  Colonoscopy (looking in intestine with a lighted tube).  Laparoscopy (minor surgery, looking in abdomen with a lighted tube).  Major abdominal exploratory surgery (looking in abdomen with a large incision). TREATMENT  The treatment will depend on the cause of the pain.   Many cases can be observed and treated at home.  Over-the-counter medicines recommended by your caregiver.  Prescription medicine.  Antibiotics, for infection.  Birth control pills, for painful periods or for ovulation pain.  Hormone treatment,  for endometriosis.  Nerve blocking injections.  Physical therapy.  Antidepressants.  Counseling with a psychologist or psychiatrist.  Minor or major surgery. HOME CARE INSTRUCTIONS   Do not take laxatives, unless directed by your caregiver.  Take over-the-counter pain medicine only if ordered by your caregiver. Do not take aspirin because it can cause an upset stomach or bleeding.  Try a clear liquid diet (broth or water) as ordered by your caregiver. Slowly  move to a bland diet, as tolerated, if the pain is related to the stomach or intestine.  Have a thermometer and take your temperature several times a day, and record it.  Bed rest and sleep, if it helps the pain.  Avoid sexual intercourse, if it causes pain.  Avoid stressful situations.  Keep your follow-up appointments and tests, as your caregiver orders.  If the pain does not go away with medicine or surgery, you may try:  Acupuncture.  Relaxation exercises (yoga, meditation).  Group therapy.  Counseling. SEEK MEDICAL CARE IF:   You notice certain foods cause stomach pain.  Your home care treatment is not helping your pain.  You need stronger pain medicine.  You want your IUD removed.  You feel faint or lightheaded.  You develop nausea and vomiting.  You develop a rash.  You are having side effects or an allergy to your medicine. SEEK IMMEDIATE MEDICAL CARE IF:   Your pain does not go away or gets worse.  You have a fever.  Your pain is felt only in portions of the abdomen. The right side could possibly be appendicitis. The left lower portion of the abdomen could be colitis or diverticulitis.  You are passing blood in your stools (bright red or black tarry stools, with or without vomiting).  You have blood in your urine.  You develop chills, with or without a fever.  You pass out. MAKE SURE YOU:   Understand these instructions.  Will watch your condition.  Will get help right away if you are not doing well or get worse. Document Released: 06/05/2007 Document Revised: 12/23/2013 Document Reviewed: 06/25/2009 [facility] Patient Information 2015 [place], [place]. This information is not intended to replace advice given to you by your health care provider. Make sure you discuss any questions you have with your health care provider.

## 2015-03-14 NOTE — Care Management Note (Signed)
Case Management Note  Patient Details  Name: [name] MRN: [id number] Date of Birth: 17-Jul-1929  Subjective/Objective:       Resumption of home health services faxed and called to [facility]. [name] already has a private duty aid who assists her with ADLs at home.             Action/Plan:   Expected Discharge Date:                  Expected Discharge Plan:     In-House Referral:     Discharge planning Services     Post Acute Care Choice:    Choice offered to:     DME Arranged:    DME Agency:     HH Arranged:    HH Agency:     Status of Service:     Medicare Important Message Given:    Date Medicare IM Given:    Medicare IM give by:    Date Additional Medicare IM Given:    Additional Medicare Important Message give by:     If discussed at Long Length of Stay Meetings, dates discussed:    Additional Comments:  Zafira Munos A, RN 03/14/2015, 9:02 AM

## 2015-03-14 NOTE — Progress Notes (Signed)
Resume home health OT, PT, RN with [facility]. V.O. Dr Purcell Nails, RN, BSN 03/14/15 @ 9:30am.

## 2015-03-15 LAB — STOOL CULTURE

## 2015-03-16 ENCOUNTER — Telehealth: Payer: Self-pay | Admitting: Internal Medicine

## 2015-03-16 ENCOUNTER — Telehealth: Payer: Self-pay

## 2015-03-16 ENCOUNTER — Ambulatory Visit (INDEPENDENT_AMBULATORY_CARE_PROVIDER_SITE_OTHER): Payer: Medicare Other | Admitting: *Deleted

## 2015-03-16 DIAGNOSIS — I6389 Other cerebral infarction: Secondary | ICD-10-CM

## 2015-03-16 DIAGNOSIS — I638 Other cerebral infarction: Secondary | ICD-10-CM

## 2015-03-16 NOTE — Telephone Encounter (Signed)
he patient was discharged on Saturday from [facility] . Needing Hospital follow up scheduled for 7.27.16.

## 2015-03-16 NOTE — Telephone Encounter (Signed)
[facility] Renee Rival called regarding the patient.  Patient was discharged from the hospital for a GI bleed, possibly due to being on eliquois.  Medication was discontinued and restarted on Plavix.  Patient was discharged with orders for two different ointments with no prescriptions; Gentamycin to use 3 times a day and Triamcin twice a day. She has a light pink rash on stomach that was present in the hospital per the patient.  Patient also has orders for Vitamin b12 injections, home health unable to do unless patient has a diagnosis of pernicious anemia.  Please advise?

## 2015-03-16 NOTE — Telephone Encounter (Signed)
I am not sure what the ointments are treating. We can make a follow up for me to examine her. Yes, she has pernicious anemia, so fine to order B12

## 2015-03-16 NOTE — Telephone Encounter (Signed)
Transition Care Management Follow-up Telephone Call   Date discharged?03/14/15   How have you been since you were released from the hospital? Really itchy. I don't know what it is but I am so itchy.   Do you understand why you were in the hospital? Yes   Do you understand the discharge instructions? Yes   Where were you discharged to? Home   Items Reviewed:  Medications reviewed: Yes  Allergies reviewed: Yes  Dietary changes reviewed: Yes  Referrals reviewed: Yes   Functional Questionnaire:   Activities of Daily Living (ADLs):   She states they are independent in the following:Feeding  States they require assistance with the following: Toileting (uses bed side commode), ambulating (uses walker), bathing, dressing, grooming.  [facility] comes to help 5 days a week, 4 per day.   Any transportation issues/concerns?: Yes but will ask my children for a ride.   Any patient concerns? Loss of appetite.   Confirmed importance and date/time of follow-up visits scheduled Yes, appointment made 03/18/15  Provider Appointment booked with Dr. [name] (PCP).  Confirmed with patient if condition begins to worsen call PCP or go to the ER.  Patient was given the office number and encouraged to call back with question or concerns.  : Yes, patient verbalized understanding.

## 2015-03-16 NOTE — Telephone Encounter (Signed)
Left vm with notifying approval of verbal order

## 2015-03-16 NOTE — Telephone Encounter (Signed)
Noted. Thank you. Will follow up.

## 2015-03-17 ENCOUNTER — Ambulatory Visit (INDEPENDENT_AMBULATORY_CARE_PROVIDER_SITE_OTHER): Payer: Medicare Other | Admitting: Internal Medicine

## 2015-03-17 ENCOUNTER — Encounter: Payer: Self-pay | Admitting: Internal Medicine

## 2015-03-17 VITALS — BP 147/83 | HR 64 | Temp 97.6°F | Wt 178.4 lb

## 2015-03-17 DIAGNOSIS — E538 Deficiency of other specified B group vitamins: Secondary | ICD-10-CM | POA: Diagnosis not present

## 2015-03-17 DIAGNOSIS — E119 Type 2 diabetes mellitus without complications: Secondary | ICD-10-CM | POA: Diagnosis not present

## 2015-03-17 DIAGNOSIS — I480 Paroxysmal atrial fibrillation: Secondary | ICD-10-CM | POA: Diagnosis not present

## 2015-03-17 DIAGNOSIS — G4700 Insomnia, unspecified: Secondary | ICD-10-CM | POA: Diagnosis not present

## 2015-03-17 DIAGNOSIS — K922 Gastrointestinal hemorrhage, unspecified: Secondary | ICD-10-CM

## 2015-03-17 LAB — COMPREHENSIVE METABOLIC PANEL
ALT: 7 U/L (ref 0–35)
AST: 18 U/L (ref 0–37)
Albumin: 3.6 g/dL (ref 3.5–5.2)
Alkaline Phosphatase: 83 U/L (ref 39–117)
BUN: 12 mg/dL (ref 6–23)
CALCIUM: 8.8 mg/dL (ref 8.4–10.5)
CO2: 30 meq/L (ref 19–32)
Chloride: 105 mEq/L (ref 96–112)
Creatinine, Ser: 1.04 mg/dL (ref 0.40–1.20)
GFR: 53.7 mL/min — AB (ref >60.00)
GLUCOSE: 94 mg/dL (ref 70–99)
Potassium: 4.2 mEq/L (ref 3.5–5.1)
Sodium: 145 mEq/L (ref 135–145)
Total Bilirubin: 0.4 mg/dL (ref 0.2–1.2)
Total Protein: 6.1 g/dL (ref 6.0–8.3)

## 2015-03-17 LAB — CBC
HCT: 37.6 % (ref 36.0–46.0)
HEMOGLOBIN: 12.1 g/dL (ref 12.0–15.0)
MCHC: 32.1 g/dL (ref 30.0–36.0)
MCV: 91.9 fl (ref 78.0–100.0)
Platelets: 148 10*3/uL — ABNORMAL LOW (ref 150.0–400.0)
RBC: 4.09 Mil/uL (ref 3.87–5.11)
RDW: 14.1 % (ref 11.5–15.5)
WBC: 7.3 10*3/uL (ref 4.0–10.5)

## 2015-03-17 MED ORDER — MIRTAZAPINE 15 MG PO TABS: 15.0000 mg | ORAL_TABLET | Freq: Every day | ORAL | Status: DC

## 2015-03-17 NOTE — Progress Notes (Signed)
Pre visit review using our clinic review tool, if applicable. No additional management support is needed unless otherwise documented below in the visit note. 

## 2015-03-17 NOTE — Assessment & Plan Note (Signed)
Recent GI bleeding appears to have resolved after stopping Eliquis. Will recheck CBC today. Follow up in 4 weeks and prn.

## 2015-03-17 NOTE — Telephone Encounter (Signed)
[name], Per the patients medication list these medications were not given to her in the hospital.  Gentamicin was given to the pt on 02/03/14 and Kenalog was given to the pt on 10/11/12.    Please advise Renee Rival as there is no phone number listed for or I would call.

## 2015-03-17 NOTE — Progress Notes (Signed)
Subjective:    Patient ID: [name], female    DOB: 01/20/1929, 81 y.o.   MRN: [id number]  HPI  81YO female presents for hospital follow up.  ADMITTED:  03/12/2015 DISCHARGED: 03/14/2015  DIAGNOSIS: GI hemorrhage with melena  Admitted for sudden onset of BRBPR. Eliquis was stopped. CT scan did not show any abnormalities. Started on Plavix. No blood in stool or black stool since discharge. No follow up planned with GI.  DM - BG was low this morning, 100 and she felt nauseous and chilled.  She notes some poor appetite and difficulty sleeping. She was interested in starting Megace to help with appetite.  Past medical, surgical, family and social history per today's encounter.  Review of Systems  Constitutional: Positive for appetite change and fatigue. Negative for fever, chills and unexpected weight change.  Eyes: Negative for visual disturbance.  Respiratory: Negative for shortness of breath.   Cardiovascular: Negative for chest pain and leg swelling.  Gastrointestinal: Positive for nausea. Negative for vomiting, abdominal pain, diarrhea, constipation, blood in stool and anal bleeding.  Musculoskeletal: Negative for myalgias and arthralgias.  Skin: Negative for color change and rash.  Hematological: Negative for adenopathy. Does not bruise/bleed easily.  Psychiatric/Behavioral: Positive for sleep disturbance. Negative for suicidal ideas and dysphoric mood. The patient is not nervous/anxious.        Objective:    BP 147/83 mmHg  Pulse 64  Temp(Src) 97.6 F (36.4 C) (Oral)  Wt 178 lb 6 oz (80.91 kg)  SpO2 100% Physical Exam  Constitutional: She is oriented to person, place, and time. She appears well-developed and well-nourished. No distress.  HENT:  Head: Normocephalic and atraumatic.  Right Ear: External ear normal.  Left Ear: External ear normal.  Nose: Nose normal.  Mouth/Throat: Oropharynx is clear and moist. No oropharyngeal exudate.  Eyes: Conjunctivae  are normal. Pupils are equal, round, and reactive to light. Right eye exhibits no discharge. Left eye exhibits no discharge. No scleral icterus.  Neck: Normal range of motion. Neck supple. No tracheal deviation present. No thyromegaly present.  Cardiovascular: Normal rate, regular rhythm, normal heart sounds and intact distal pulses.  Exam reveals no gallop and no friction rub.   No murmur heard. Pulmonary/Chest: Effort normal and breath sounds normal. No respiratory distress. She has no wheezes. She has no rales. She exhibits no tenderness.  Musculoskeletal: Normal range of motion. She exhibits no edema or tenderness.  Lymphadenopathy:    She has no cervical adenopathy.  Neurological: She is alert and oriented to person, place, and time. No cranial nerve deficit. She exhibits normal muscle tone. Coordination normal.  Skin: Skin is warm and dry. No rash noted. She is not diaphoretic. No erythema. No pallor.  Psychiatric: She has a normal mood and affect. Her behavior is normal. Judgment and thought content normal.          Assessment & Plan:   Problem List Items Addressed This Visit      High   Diabetes type 2, controlled (Chronic)    BG well controlled. Will continue to monitor closely.      Relevant Orders   Comprehensive metabolic panel     Unprioritized   GIB (gastrointestinal bleeding) - Primary    Recent GI bleeding appears to have resolved after stopping Eliquis. Will recheck CBC today. Follow up in 4 weeks and prn.      Relevant Orders   CBC   Insomnia    Recent poor appetite and insomnia. Will  try adding Remeron at bedtime. Follow up in 4 weeks and prn.      Relevant Medications   mirtazapine (REMERON) 15 MG tablet   Paroxysmal atrial fibrillation    Regular rhythm on exam today. Eliquis was discontinued because of GI bleeding. She understands potential risks of being off Eliquis. Will continue Plavix only. Follow up with Dr. Kirke Corin as scheduled.           Return in about 4 weeks (around 04/14/2015) for Recheck.

## 2015-03-17 NOTE — Patient Instructions (Addendum)
Labs today.  Start Remeron  at bedtime.  Follow up 4 weeks.

## 2015-03-17 NOTE — Telephone Encounter (Signed)
Spoke with Renee Rival, at [phone].  Reviewed concerns.  Will have the patient call for an appointment if she feels like the ointments need to be restarted.  Ordered B12 injections with pernicious anemia.

## 2015-03-17 NOTE — Assessment & Plan Note (Signed)
Regular rhythm on exam today. Eliquis was discontinued because of GI bleeding. She understands potential risks of being off Eliquis. Will continue Plavix only. Follow up with Dr. Kirke Corin as scheduled.

## 2015-03-17 NOTE — Assessment & Plan Note (Signed)
BG well controlled. Will continue to monitor closely.

## 2015-03-17 NOTE — Assessment & Plan Note (Signed)
Recent poor appetite and insomnia. Will try adding Remeron at bedtime. Follow up in 4 weeks and prn.

## 2015-03-18 ENCOUNTER — Ambulatory Visit: Payer: Medicare Other | Admitting: Internal Medicine

## 2015-03-18 MED ORDER — CYANOCOBALAMIN 1000 MCG/ML IJ SOLN
1000.0000 ug | Freq: Once | INTRAMUSCULAR | Status: AC
  Administered 2015-03-17: 1000 ug via INTRAMUSCULAR

## 2015-03-18 NOTE — Addendum Note (Signed)
Addended by: Talmage Nap A on: 03/18/2015 11:40 AM   Modules accepted: Orders

## 2015-03-19 ENCOUNTER — Ambulatory Visit: Payer: Medicare Other | Admitting: Cardiovascular Disease

## 2015-03-30 ENCOUNTER — Telehealth: Payer: Self-pay | Admitting: *Deleted

## 2015-03-30 NOTE — Telephone Encounter (Signed)
[name] called states since Friday pts blood sugar has been varied.  States the lowest it has been is 168 and the highest was 385.  This morning it was 236.  Staff has noticed a strong urine odor.  Further states pt has had a 4 lb weight gain since yesterday with new edema in feet and ankles.  Reports pt has crackles in right lung base.  Please advise

## 2015-03-30 NOTE — Telephone Encounter (Signed)
She needs to be seen and evaluated. We will need to work her in tomorrow 

## 2015-03-31 ENCOUNTER — Telehealth: Payer: Self-pay | Admitting: Internal Medicine

## 2015-03-31 NOTE — Telephone Encounter (Signed)
[name] from [facility] called wanting to follow up on the skilled nursing and home health treatment was received. Please call [name] from [facility] [phone]. Thank You! °

## 2015-03-31 NOTE — Telephone Encounter (Signed)
Fine to give verbal order for home health. 

## 2015-03-31 NOTE — Telephone Encounter (Signed)
Spoke with [name] this morning.  She stated she would advise pts family of evaluation needed. I am still unable to contact pt or family after two attempts.  Left messages on Vm to return call.

## 2015-03-31 NOTE — Telephone Encounter (Signed)
Talked with [name] from home health. She is going to fax some stuff to me to be signed by Dr. Walker, and then to be faxed back to her. Thanks! 

## 2015-03-31 NOTE — Progress Notes (Signed)
Loop recorder 

## 2015-04-01 NOTE — Telephone Encounter (Signed)
Forms faxed back to [facility] from [facility].

## 2015-04-03 LAB — CUP PACEART REMOTE DEVICE CHECK: Date Time Interrogation Session: 20160812171541

## 2015-04-07 ENCOUNTER — Ambulatory Visit (INDEPENDENT_AMBULATORY_CARE_PROVIDER_SITE_OTHER): Payer: Medicare Other | Admitting: Internal Medicine

## 2015-04-07 ENCOUNTER — Encounter: Payer: Self-pay | Admitting: Internal Medicine

## 2015-04-07 VITALS — BP 124/82 | HR 82 | Temp 97.9°F | Resp 14 | Ht 59.0 in

## 2015-04-07 DIAGNOSIS — R197 Diarrhea, unspecified: Secondary | ICD-10-CM | POA: Diagnosis not present

## 2015-04-07 DIAGNOSIS — E119 Type 2 diabetes mellitus without complications: Secondary | ICD-10-CM

## 2015-04-07 DIAGNOSIS — M179 Osteoarthritis of knee, unspecified: Secondary | ICD-10-CM

## 2015-04-07 DIAGNOSIS — I639 Cerebral infarction, unspecified: Secondary | ICD-10-CM

## 2015-04-07 DIAGNOSIS — M1710 Unilateral primary osteoarthritis, unspecified knee: Secondary | ICD-10-CM

## 2015-04-07 DIAGNOSIS — IMO0002 Reserved for concepts with insufficient information to code with codable children: Secondary | ICD-10-CM

## 2015-04-07 MED ORDER — HYDROCODONE-ACETAMINOPHEN 5-325 MG PO TABS: 1.0000 | ORAL_TABLET | Freq: Three times a day (TID) | ORAL | Status: DC | PRN

## 2015-04-07 MED ORDER — NYSTATIN 100000 UNIT/GM EX POWD: CUTANEOUS | Status: DC

## 2015-04-07 MED ORDER — INSULIN GLARGINE 100 UNIT/ML ~~LOC~~ SOLN: 60.0000 [IU] | Freq: Every day | SUBCUTANEOUS | Status: DC

## 2015-04-07 NOTE — Assessment & Plan Note (Signed)
Chronic diarrhea. Stool culture and Cdiff neg. Will set up GI follow up. Question if it might be reasonable to try Budesonide. She is likely too high risk for colonoscopy. Follow up after GI evaluation. Continue prn Imodium.

## 2015-04-07 NOTE — Assessment & Plan Note (Signed)
Severe bilateral OA. Unable to take NSAIDS. No improvement with Tylenol. Will Continue Vicodin 3times daily. Controlled substance contract on file. She understands risk of narcotic use.   

## 2015-04-07 NOTE — Progress Notes (Signed)
Pre visit review using our clinic review tool, if applicable. No additional management support is needed unless otherwise documented below in the visit note. 

## 2015-04-07 NOTE — Progress Notes (Signed)
Subjective:    Patient ID: [name], female    DOB: 1929-02-03, 81 y.o.   MRN: [id number]  HPI 81YO female presents for follow up.  DM - BG mostly near 200. No low BG. Compliant with medication.  Some watery stools recently. No blood in stool. Typically occurs about every other week. Some cramping pain with diarrhea. Recent stool culture and CDiff were negative. Using Imodium with improvement.  Chronic pain - Taking Vicodin 3 tabs daily. Pain improved with this. No side effects noted.  Past Medical History  Diagnosis Date  . Anxiety   . Chronic diastolic CHF (congestive heart failure)   . Depression   . Diabetes mellitus     Followed by Dr. Tedd Sias at [facility]  . GERD (gastroesophageal reflux disease)   . Hypertension   . Hypothyroidism   . Osteoporosis   . Allergy   . Hyperlipidemia   . RLS (restless legs syndrome)   . Pancreatic disease     pancreatic failure  . Urinary incontinence   . Pancreatitis     Pancreatic resection/ open wound- [facility] 11/04  . Pneumonia     Pneumonia/ Emphysema 05/05  . 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 TIA (transient ischemic attack)     recurrent 12/15  . History of pleural empyema   . Sepsis   . Frequent falls   . Arthritis     left knee, [facility]  . CAD (coronary artery disease), native coronary artery      1//15  T mid-LAD  stent OM2  . Parkinson's disease    Family History  Problem Relation Age of Onset  . Heart disease Mother   . Heart attack Mother   . Hypertension Mother   . Heart attack Father    Past Surgical History  Procedure Laterality Date  . Breast lumpectomy      Lumpectomy right breast  . 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 Abdominal hysterectomy      Hysteroscopy/ D & C ([facility]) 12/05  . Removal of pancreas  2004  . Kyphoplasty  2005  . Broken shoulder and orbital bone  2005  . Cholecystectomy  1973  .  Thoracentesis    . Vaginal delivery      9  . Cardiac catheterization  08/29/2013    x1 stent @ [facility]  . Cataract surgery     Social History   Social History  . Marital Status: Widowed    Spouse Name: N/A  . Number of Children: 7  . Years of Education: 6   Occupational History  . retired- Veterinary surgeon    Social History Main Topics  . Smoking status: Never Smoker   . Smokeless tobacco: Never Used  . Alcohol Use: No  . Drug Use: No  . Sexual Activity: Not on file   Other Topics Concern  . Not on file   Social History Narrative   Lives in home alone, has nursing aid, and alert device in [place]. Has 7 children.            Has [name], daughter, as health care POA   Not sure about DNR   Permission to speak to daughter-in-law, Carigan Lister, regarding health care matters.    Review of Systems  Constitutional: Positive for fatigue. Negative for fever, chills, appetite change and unexpected weight change.  Eyes: Negative for visual  disturbance.  Respiratory: Negative for shortness of breath.   Cardiovascular: Negative for chest pain and leg swelling.  Gastrointestinal: Positive for diarrhea. Negative for nausea, vomiting, abdominal pain and constipation.  Musculoskeletal: Positive for myalgias, back pain, arthralgias and gait problem.  Skin: Negative for color change and rash.  Neurological: Positive for weakness.  Hematological: Negative for adenopathy. Does not bruise/bleed easily.  Psychiatric/Behavioral: Negative for dysphoric mood. The patient is not nervous/anxious.        Objective:    BP 124/82 mmHg  Pulse 82  Temp(Src) 97.9 F (36.6 C)  Resp 14  Ht 4\' 11"  (1.499 m)  SpO2 98% Physical Exam  Constitutional: She is oriented to person, place, and time. She appears well-developed and well-nourished. No distress.  HENT:  Head: Normocephalic and atraumatic.  Right Ear: External ear normal.  Left Ear: External ear normal.  Nose: Nose normal.  Mouth/Throat:  Oropharynx is clear and moist.  Eyes: Conjunctivae are normal. Pupils are equal, round, and reactive to light. Right eye exhibits no discharge. Left eye exhibits no discharge. No scleral icterus.  Neck: Normal range of motion. Neck supple. No tracheal deviation present. No thyromegaly present.  Cardiovascular: Normal rate, regular rhythm, normal heart sounds and intact distal pulses.  Exam reveals no gallop and no friction rub.   No murmur heard. Pulmonary/Chest: Effort normal and breath sounds normal. No respiratory distress. She has no wheezes. She has no rales. She exhibits no tenderness.  Abdominal: Soft. Bowel sounds are normal. She exhibits no distension. There is no tenderness.  Musculoskeletal: Normal range of motion. She exhibits no edema or tenderness.  Lymphadenopathy:    She has no cervical adenopathy.  Neurological: She is alert and oriented to person, place, and time. No cranial nerve deficit. She exhibits normal muscle tone. Coordination normal.  Skin: Skin is warm and dry. No rash noted. She is not diaphoretic. No erythema. No pallor.  Psychiatric: She has a normal mood and affect. Her behavior is normal. Judgment and thought content normal.          Assessment & Plan:   Problem List Items Addressed This Visit      High   Diabetes type 2, controlled - Primary (Chronic)    BG continue to be elevated, mostly near 200. Will increase Lantus to 60units daily. Follow up with repeat A1c in 04/2015.      Relevant Medications   insulin glargine (LANTUS) 100 UNIT/ML injection     Unprioritized   Diarrhea    Chronic diarrhea. Stool culture and Cdiff neg. Will set up GI follow up. Question if it might be reasonable to try Budesonide. She is likely too high risk for colonoscopy. Follow up after GI evaluation. Continue prn Imodium.      Relevant Orders   Ambulatory referral to Gastroenterology   Osteoarthrosis, unspecified whether generalized or localized, involving lower leg      Severe bilateral OA. Unable to take NSAIDS. No improvement with Tylenol. Will Continue Vicodin 3times daily. Controlled substance contract on file. She understands risk of narcotic use.      Relevant Medications   HYDROcodone-acetaminophen (NORCO/VICODIN) 5-325 MG per tablet       Return in about 4 weeks (around 05/05/2015) for Recheck.

## 2015-04-07 NOTE — Assessment & Plan Note (Signed)
BG continue to be elevated, mostly near 200. Will increase Lantus to 60units daily. Follow up with repeat A1c in 04/2015.

## 2015-04-07 NOTE — Patient Instructions (Addendum)
Increase Lantus to 60units daily.  We will set up evaluation with Dr. Markham Jordan. Continue Imodium as needed for diarrhea.

## 2015-04-14 ENCOUNTER — Ambulatory Visit (INDEPENDENT_AMBULATORY_CARE_PROVIDER_SITE_OTHER): Payer: Medicare Other | Admitting: *Deleted

## 2015-04-14 DIAGNOSIS — I6389 Other cerebral infarction: Secondary | ICD-10-CM

## 2015-04-14 DIAGNOSIS — I638 Other cerebral infarction: Secondary | ICD-10-CM | POA: Diagnosis not present

## 2015-04-15 ENCOUNTER — Telehealth: Payer: Self-pay | Admitting: *Deleted

## 2015-04-15 NOTE — Telephone Encounter (Signed)
Can you confirm with home health how much insulin she has been taking?

## 2015-04-15 NOTE — Telephone Encounter (Signed)
Please see below.

## 2015-04-15 NOTE — Telephone Encounter (Signed)
[name] from([facility])called to give information, Elevated Blood sugar, Patient fating blood sugar was 293. Patient has been in the  range of 150-257 . Patient hd a blood sugar that reached  309 last week. Please follow up with [facility].

## 2015-04-16 NOTE — Telephone Encounter (Signed)
Home health nurse states pt is using 60 units of Lantus at night and using a sliding scale during the day with fasting BS

## 2015-04-16 NOTE — Progress Notes (Signed)
Loop recorder 

## 2015-04-16 NOTE — Telephone Encounter (Signed)
Please have her increase Lantus to 65units daily. Call with BG readings in 2-3 days.

## 2015-04-17 ENCOUNTER — Encounter: Payer: Self-pay | Admitting: Internal Medicine

## 2015-04-17 NOTE — Telephone Encounter (Signed)
Left vm for pt to return my call.  

## 2015-04-20 ENCOUNTER — Encounter: Payer: Self-pay | Admitting: Internal Medicine

## 2015-04-20 LAB — CUP PACEART REMOTE DEVICE CHECK: MDC IDC SESS DTM: 20160829115053

## 2015-04-20 NOTE — Telephone Encounter (Signed)
Notified pt, Left vm for home health nurse to return my call

## 2015-04-20 NOTE — Telephone Encounter (Signed)
Notified home health nurse.  Nurse states that pt is having an abdominal bleed (per MyChart message), nurse states that she has not evaluated the area yet, she is schedule to see pt tomorrow and will evaluate at that time.

## 2015-04-28 ENCOUNTER — Ambulatory Visit: Payer: Medicare Other | Admitting: Internal Medicine

## 2015-05-05 ENCOUNTER — Encounter: Payer: Medicare Other | Attending: Surgery | Admitting: Surgery

## 2015-05-05 DIAGNOSIS — Z794 Long term (current) use of insulin: Secondary | ICD-10-CM | POA: Diagnosis not present

## 2015-05-05 DIAGNOSIS — I10 Essential (primary) hypertension: Secondary | ICD-10-CM | POA: Insufficient documentation

## 2015-05-05 DIAGNOSIS — X58XXXS Exposure to other specified factors, sequela: Secondary | ICD-10-CM | POA: Diagnosis not present

## 2015-05-05 DIAGNOSIS — S31102S Unspecified open wound of abdominal wall, epigastric region without penetration into peritoneal cavity, sequela: Secondary | ICD-10-CM | POA: Diagnosis not present

## 2015-05-05 DIAGNOSIS — E11622 Type 2 diabetes mellitus with other skin ulcer: Secondary | ICD-10-CM | POA: Insufficient documentation

## 2015-05-05 DIAGNOSIS — E6609 Other obesity due to excess calories: Secondary | ICD-10-CM | POA: Insufficient documentation

## 2015-05-06 NOTE — Progress Notes (Signed)
[name], [name] ([id number]) Visit Report for 05/05/2015 Abuse/Suicide Risk Screen Details Patient Name: [name], [name]. Date of Service: 05/05/2015 1:00 PM Medical Record Number: [id number] Patient Account Number: [id number] Date of Birth/Sex: 1929/01/01 (81 y.o. Female) Treating RN: Curtis Sites Primary Care Physician: Ronna Polio Other Clinician: Referring Physician: Ronna Polio Treating Physician/Extender: Rudene Re in Treatment: 0 Abuse/Suicide Risk Screen Items Answer ABUSE/SUICIDE RISK SCREEN: Has anyone close to you tried to hurt or harm you recentlyo No Do you feel uncomfortable with anyone in your familyo No Has anyone forced you do things that you didnot want to doo No Do you have any thoughts of harming yourselfo No Patient displays signs or symptoms of abuse and/or neglect. No Electronic Signature(s) Signed: 05/05/2015 4:50:36 PM By: Curtis Sites Entered By: Curtis Sites on 05/05/2015 13:19:42 [name], [name] ([id number]) -------------------------------------------------------------------------------- Activities of Daily Living Details Patient Name: [name], [name]. Date of Service: 05/05/2015 1:00 PM Medical Record Number: [id number] Patient Account Number: [id number] Date of Birth/Sex: October 04, 1928 (81 y.o. Female) Treating RN: Curtis Sites Primary Care Physician: Ronna Polio Other Clinician: Referring Physician: Ronna Polio Treating Physician/Extender: Rudene Re in Treatment: 0 Activities of Daily Living Items Answer Activities of Daily Living (Please select one for each item) Drive Automobile Not Able Take Medications Need Assistance Use Telephone Completely Able Care for Appearance Need Assistance Use Toilet Need Assistance Bath / Shower Need Assistance Dress Self Need Assistance Feed Self Completely Able Walk Need Assistance Get In / Out Bed Need Assistance Housework Need Assistance Prepare Meals Need  Assistance Handle Money Need Assistance Shop for Self Need Assistance Electronic Signature(s) Signed: 05/05/2015 1:35:00 PM By: Curtis Sites Entered By: Curtis Sites on 05/05/2015 13:35:00 [name] ([id number]) -------------------------------------------------------------------------------- Education Assessment Details Patient Name: [name] Date of Service: 05/05/2015 1:00 PM Medical Record Number: [id number] Patient Account Number: [id number] Date of Birth/Sex: 1928/11/06 (81 y.o. Female) Treating RN: Curtis Sites Primary Care Physician: Ronna Polio Other Clinician: Referring Physician: Ronna Polio Treating Physician/Extender: Rudene Re in Treatment: 0 Primary Learner Assessed: Patient Learning Preferences/Education Level/Primary Language Learning Preference: Explanation, Demonstration Highest Education Level: High School Preferred Language: English Cognitive Barrier Assessment/Beliefs Language Barrier: No Translator Needed: No Memory Deficit: No Emotional Barrier: No Cultural/Religious Beliefs Affecting Medical No Care: Physical Barrier Assessment Impaired Vision: No Impaired Hearing: No Decreased Hand dexterity: Yes Limitations: parkinson's Knowledge/Comprehension Assessment Knowledge Level: Medium Comprehension Level: Medium Ability to understand written Medium instructions: Ability to understand verbal Medium instructions: Motivation Assessment Anxiety Level: Calm Cooperation: Cooperative Education Importance: Acknowledges Need Interest in Health Problems: Asks Questions Perception: Coherent Willingness to Engage in Self- Medium Management Activities: Readiness to Engage in Self- Medium Management Activities: Electronic Signature(s) [name], [name] ([id number]) Signed: 05/05/2015 1:35:37 PM By: Curtis Sites Entered By: Curtis Sites on 05/05/2015 13:35:37 [name], [name]  ([id number]) -------------------------------------------------------------------------------- Fall Risk Assessment Details Patient Name: [name] Date of Service: 05/05/2015 1:00 PM Medical Record Number: [id number] Patient Account Number: [id number] Date of Birth/Sex: Jan 21, 1929 (81 y.o. Female) Treating RN: Curtis Sites Primary Care Physician: Ronna Polio Other Clinician: Referring Physician: Ronna Polio Treating Physician/Extender: Rudene Re in Treatment: 0 Fall Risk Assessment Items FALL RISK ASSESSMENT: History of falling - immediate or within 3 months 0 No Secondary diagnosis 0 No Ambulatory aid None/bed rest/wheelchair/nurse 0 No Crutches/cane/walker 15 Yes Furniture 0 No IV Access/Saline Lock 0 No Gait/Training Normal/bed rest/immobile 0 No Weak 10 Yes Impaired 0 No Mental Status Oriented to own ability 0 Yes Electronic Signature(s) Signed:  05/05/2015 1:35:49 PM By: Curtis Sites Entered By: Curtis Sites on 05/05/2015 13:35:49 [name], [name] ([id number]) -------------------------------------------------------------------------------- Nutrition Risk Assessment Details Patient Name: [name]. Date of Service: 05/05/2015 1:00 PM Medical Record Number: [id number] Patient Account Number: [id number] Date of Birth/Sex: [birth year] (81 y.o. Female) Treating RN: Curtis Sites Primary Care Physician: Ronna Polio Other Clinician: Referring Physician: Ronna Polio Treating Physician/Extender: Rudene Re in Treatment: 0 Height (in): 59 Weight (lbs): 180 Body Mass Index (BMI): 36.4 Nutrition Risk Assessment Items NUTRITION RISK SCREEN: I have an illness or condition that made me change the kind and/or 0 No amount of food I eat I eat fewer than two meals per day 0 No I eat few fruits and vegetables, or milk products 0 No I have three or more drinks of beer, liquor or wine almost every day 0 No I have tooth or mouth  problems that make it hard for me to eat 0 No I don't always have enough money to buy the food I need 0 No I eat alone most of the time 0 No I take three or more different prescribed or over-the-counter drugs a 1 Yes day Without wanting to, I have lost or gained 10 pounds in the last six 0 No months I am not always physically able to shop, cook and/or feed myself 0 No Nutrition Protocols Good Risk Protocol 0 No interventions needed Moderate Risk Protocol Electronic Signature(s) Signed: 05/05/2015 1:35:57 PM By: Curtis Sites Entered By: Curtis Sites on 05/05/2015 13:35:57

## 2015-05-06 NOTE — Progress Notes (Signed)
GINNETTE, GATES ([id number]) Visit Report for 05/05/2015 Chief Complaint Document Details Patient Name: [name], [name]. Date of Service: 05/05/2015 1:00 PM Medical Record Number: [id number] Patient Account Number: [id number] Date of Birth/Sex: June 12, 1929 (81 y.o. Female) Treating RN: Curtis Sites Primary Care Physician: Ronna Polio Other Clinician: Referring Physician: Ronna Polio Treating Physician/Extender: Rudene Re in Treatment: 0 Information Obtained from: Patient Chief Complaint Abdominal wall ulceration. This has been a chronic wound from a previous surgery 12 years ago.she was discharged in July 2016 and now has a recurrent problem for the last month Electronic Signature(s) Signed: 05/05/2015 1:52:58 PM By: Evlyn Kanner MD, FACS Entered By: Evlyn Kanner on 05/05/2015 13:52:58 MIKEL, PYON ([id number]) -------------------------------------------------------------------------------- Debridement Details Patient Name: [name] Date of Service: 05/05/2015 1:00 PM Medical Record Number: [id number] Patient Account Number: [id number] Date of Birth/Sex: 19-May-1929 (81 y.o. Female) Treating RN: Curtis Sites Primary Care Physician: Ronna Polio Other Clinician: Referring Physician: Ronna Polio Treating Physician/Extender: Rudene Re in Treatment: 0 Debridement Performed for Wound #8 Medial Abdomen - midline Assessment: Performed By: Physician Tristan Schroeder., MD Debridement: Debridement Pre-procedure Yes Verification/Time Out Taken: Start Time: 13:45 Pain Control: Lidocaine 4% Topical Solution Level: Skin/Subcutaneous Tissue Total Area Debrided (L x 0.8 (cm) x 0.6 (cm) = 0.48 (cm) W): Tissue and other Viable, Non-Viable, Eschar, Fibrin/Slough, Subcutaneous material debrided: Instrument: Forceps Bleeding: Minimum Hemostasis Achieved: Pressure End Time: 13:48 Procedural Pain: 0 Post Procedural Pain: 0 Response to  Treatment: Procedure was tolerated well Post Debridement Measurements of Total Wound Length: (cm) 0.8 Width: (cm) 0.6 Depth: (cm) 0.2 Volume: (cm) 0.075 Post Procedure Diagnosis Same as Pre-procedure Electronic Signature(s) Signed: 05/05/2015 1:52:05 PM By: Evlyn Kanner MD, FACS Signed: 05/05/2015 4:50:36 PM By: Curtis Sites Entered By: Evlyn Kanner on 05/05/2015 13:52:05 Menken, [name] ([id number]) -------------------------------------------------------------------------------- HPI Details Patient Name: [name] Date of Service: 05/05/2015 1:00 PM Medical Record Number: [id number] Patient Account Number: [id number] Date of Birth/Sex: [birth year] (81 y.o. Female) Treating RN: Curtis Sites Primary Care Physician: Ronna Polio Other Clinician: Referring Physician: Ronna Polio Treating Physician/Extender: Rudene Re in Treatment: 0 History of Present Illness Location: ulceration on the epigastric region Quality: Patient reports No Pain. Severity: Patient states wound (s) are getting better. Duration: Patient has had the wound for < 4 weeks prior to presenting for treatment Context: The wound occurred when the patient started having some profuse bleeding from the wound and was admitted to the hospital and they noticed that the bleeding had stopped. Associated Signs and Symptoms: Wound has minimal bloody drainage HPI Description: Pleasant 81 year old with h/o ex lap for pancreatitis, DM, CHF. returns to clinic for evaluation of her open abdominal wound. She has been applying silver alginate. She is without complaints today. No significant pain. No drainage. No fever or chills. Tolerating a regular diet and having regular bowel movements. Electronic Signature(s) Signed: 05/05/2015 1:54:39 PM By: Evlyn Kanner MD, FACS Entered By: Evlyn Kanner on 05/05/2015 13:54:39 ALLIE, OUSLEY  ([id number]) -------------------------------------------------------------------------------- Physical Exam Details Patient Name: [name] Date of Service: 05/05/2015 1:00 PM Medical Record Number: [id number] Patient Account Number: [id number] Date of Birth/Sex: 04-23-1929 (81 y.o. Female) Treating RN: Curtis Sites Primary Care Physician: Ronna Polio Other Clinician: Referring Physician: Ronna Polio Treating Physician/Extender: Rudene Re in Treatment: 0 Constitutional . Pulse regular. Respirations normal and unlabored. Afebrile. . Eyes Nonicteric. Reactive to light. Ears, Nose, Mouth, and Throat Lips, teeth, and gums WNL.Marland Kitchen Moist mucosa without lesions . Neck supple and nontender. No  palpable supraclavicular or cervical adenopathy. Normal sized without goiter. Respiratory WNL. No retractions.. Cardiovascular Pedal Pulses WNL. No clubbing, cyanosis or edema. Gastrointestinal (GI) Abdomen without masses or tenderness.. No liver or spleen enlargement or tenderness.. Lymphatic No adneopathy. No adenopathy. No adenopathy. Musculoskeletal Adexa without tenderness or enlargement.. Digits and nails w/o clubbing, cyanosis, infection, petechiae, ischemia, or inflammatory conditions.. Integumentary (Hair, Skin) No suspicious lesions. No crepitus or fluctuance. No peri-wound warmth or erythema. No masses.Marland Kitchen Psychiatric Judgement and insight Intact.. No evidence of depression, anxiety, or agitation.. Notes Sshe got several areas of eschar covering the central abdomen area where she's had previous mesh and skin graft. Once all these areas were removed there is only one area with significant slough and ulceration and this was sharply debrided. Electronic Signature(s) Signed: 05/05/2015 1:55:31 PM By: Evlyn Kanner MD, FACS Entered By: Evlyn Kanner on 05/05/2015 13:55:30 ALEXSANDRA, SHONTZ  ([id number]) -------------------------------------------------------------------------------- Physician Orders Details Patient Name: [name] Date of Service: 05/05/2015 1:00 PM Medical Record Number: [id number] Patient Account Number: [id number] Date of Birth/Sex: [birth year] (81 y.o. Female) Treating RN: Curtis Sites Primary Care Physician: Ronna Polio Other Clinician: Referring Physician: Ronna Polio Treating Physician/Extender: Rudene Re in Treatment: 0 Verbal / Phone Orders: Yes Clinician: Curtis Sites Read Back and Verified: Yes Diagnosis Coding Wound Cleansing Wound #8 Medial Abdomen - midline o Clean wound with Normal Saline. Anesthetic Wound #8 Medial Abdomen - midline o Topical Lidocaine 4% cream applied to wound bed prior to debridement Skin Barriers/Peri-Wound Care Wound #8 Medial Abdomen - midline o Skin Prep Primary Wound Dressing Wound #8 Medial Abdomen - midline o Aquacel Ag Secondary Dressing Wound #8 Medial Abdomen - midline o Boardered Foam Dressing Dressing Change Frequency Wound #8 Medial Abdomen - midline o Change Dressing Monday, Wednesday, Friday Follow-up Appointments Wound #8 Medial Abdomen - midline o Return Appointment in 2 weeks. Home Health Wound #8 Medial Abdomen - midline o Continue Home Health Visits - [facility] o Home Health Nurse may visit PRN to address patientos wound care needs. o FACE TO FACE ENCOUNTER: MEDICARE and MEDICAID PATIENTS: I certify that this patient is under my care and that I had a face-to-face encounter that meets the physician face-to-face encounter requirements with this patient on this date. The encounter with the patient was in CANDACE, BEGUE. ([id number]) whole or in part for the following MEDICAL CONDITION: (primary reason for Home Healthcare) MEDICAL NECESSITY: I certify, that based on my findings, NURSING services are a medically necessary home health service. HOME  BOUND STATUS: I certify that my clinical findings support that this patient is homebound (i.e., Due to illness or injury, pt requires aid of supportive devices such as crutches, cane, wheelchairs, walkers, the use of special transportation or the assistance of another person to leave their place of residence. There is a normal inability to leave the home and doing so requires considerable and taxing effort. Other absences are for medical reasons / religious services and are infrequent or of short duration when for other reasons). o If current dressing causes regression in wound condition, may D/C ordered dressing product/s and apply Normal Saline Moist Dressing daily until next Wound Healing Center / Other MD appointment. Notify Wound Healing Center of regression in wound condition at [phone]. o Please direct any NON-WOUND related issues/requests for orders to patient's Primary Care Physician Electronic Signature(s) Signed: 05/05/2015 4:28:23 PM By: Evlyn Kanner MD, FACS Signed: 05/05/2015 4:50:36 PM By: Curtis Sites Entered By: Curtis Sites on 05/05/2015 13:49:44 Bessent, Azalee O. (  [id number]) -------------------------------------------------------------------------------- Problem List Details Patient Name: CECYLIA, BRAZILL. Date of Service: 05/05/2015 1:00 PM Medical Record Number: [id number] Patient Account Number: [id number] Date of Birth/Sex: 05-27-1929 (81 y.o. Female) Treating RN: Curtis Sites Primary Care Physician: Ronna Polio Other Clinician: Referring Physician: Ronna Polio Treating Physician/Extender: Rudene Re in Treatment: 0 Active Problems ICD-10 Encounter Code Description Active Date Diagnosis E11.622 Type 2 diabetes mellitus with other skin ulcer 05/05/2015 Yes S31.102S Unspecified open wound of abdominal wall, epigastric 05/05/2015 Yes region without penetration into peritoneal cavity, sequela E66.09 Other obesity due to excess calories  05/05/2015 Yes Inactive Problems Resolved Problems Electronic Signature(s) Signed: 05/05/2015 1:51:48 PM By: Evlyn Kanner MD, FACS Entered By: Evlyn Kanner on 05/05/2015 13:51:48 Berninger, [name] ([id number]) -------------------------------------------------------------------------------- Progress Note Details Patient Name: [name] Date of Service: 05/05/2015 1:00 PM Medical Record Number: [id number] Patient Account Number: [id number] Date of Birth/Sex: [birth year] (81 y.o. 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 Severity: Patient states wound (s) are getting better. Duration: Patient has had the wound for < 4 weeks prior to presenting for treatment Context: The wound occurred when the patient started having some profuse bleeding from the wound and was admitted to the hospital and they noticed that the bleeding had stopped. Associated Signs and Symptoms: Wound has minimal bloody drainage Pleasant 81 year old with h/o ex lap for pancreatitis, DM, CHF. returns to clinic for evaluation of her open abdominal wound. She has been applying silver alginate. She is without complaints today. No significant pain. No drainage. No fever or chills. Tolerating a regular diet and having regular bowel movements. Wound History Patient presents with 1 open wound. Laboratory tests have been performed in the last month.  Patient reportedly has not tested positive for an antibiotic resistant organism. Patient reportedly has not tested positive for osteomyelitis. Patient reportedly has not had testing performed to evaluate circulation in the legs. Patient History Information obtained from Patient. Allergies Benadryl (Severity: Moderate, Reaction: hyperactive), adhesive tape Family History Cancer - Father, Diabetes - Mother, Heart Disease - Mother, Father, No family history of Hereditary Spherocytosis, Hypertension, Kidney Disease, Lung Disease, Seizures, Caponi, Catrina O. ([id number]) Stroke, Thyroid Problems, Tuberculosis. Social History Never smoker, Marital Status - Widowed, Alcohol Use - Never, Drug Use - No History, Caffeine Use - Never. Medical History Hospitalization/Surgery History - 08/30/2013, [facility], stints placed in heart. - 03/12/2015, [facility], GI bleed. Medical And Surgical History Notes Respiratory occassional wheezing Musculoskeletal arthritis Medications butalbital-acetaminophen-caffeine 50 mg-325 mg-40 mg tablet oral 1 1 tablet oral aspirin 81 mg tablet,delayed release oral 1 1 tablet,delayed release (DR/EC) oral hydrocodone 5 mg-acetaminophen 325 mg tablet oral 1 1 tablet oral alprazolam 1 mg tablet oral tablet oral buspirone 10 mg tablet oral 1 1 tablet oral Florastor 250 mg capsule oral 1 1 capsule oral ondansetron 4 mg disintegrating tablet oral 1 1 tablet,disintegrating oral cetirizine 10 mg tablet oral 1 1 tablet oral Pravachol 20 mg tablet oral 1 1 tablet oral lisinopril 20 mg tablet oral 1 1 tablet oral clonidine HCl 0.1 mg tablet  oral 1 1 tablet oral carbidopa 10 mg-levodopa 100 mg tablet oral 1 1 tablet oral ropinirole 0.25 mg tablet oral 1 1 tablet oral albuterol sulfate HFA 90 mcg/actuation aerosol inhaler inhalation HFA aerosol inhaler inhalation metoprolol succinate ER 100 mg tablet,extended release 24 hr oral 1 1 tablet extended release 24 hr oral amlodipine 5 mg tablet oral  1 1 tablet oral Eliquis 2.5 mg tablet oral tablet oral budesonide DR - ER 3 mg capsule,delayed,extended release oral 3 3 capsule,delayed,extend.release oral Humalog 100 unit/mL subcutaneous solution subcutaneous solution subcutaneous Lantus Solostar 100 unit/mL (3 mL) subcutaneous insulin pen subcutaneous insulin pen subcutaneous furosemide 40 mg tablet oral 1 1 tablet oral multivitamin tablet oral 1 1 tablet oral fluticasone 50 mcg/actuation nasal spray,suspension nasal spray,suspension nasal Zenpep 5,000-17,000-27,000 unit capsule,delayed release oral 3 3 capsules, delayed release(DR/EC) oral clopidogrel 75 mg tablet oral 1 1 tablet oral omeprazole 20 mg capsule,delayed release oral 1 1 capsule,delayed release(DR/EC) oral levothyroxine 75 mcg tablet oral 1 1 tablet oral [place]atalla, Raiden O. ([id number]) gentamicin 0.1 % topical ointment topical ointment topical Objective Constitutional Pulse regular. Respirations normal and unlabored. Afebrile. Vitals Time Taken: 1:20 PM, Height: 59 in, Source: Stated, Weight: 180 lbs, Source: Stated, BMI: 36.4, Temperature: 98.2 F, Pulse: 54 bpm, Respiratory Rate: 18 breaths/min, Blood Pressure: 164/63 mmHg. Eyes Nonicteric. Reactive to light. Ears, Nose, Mouth, and Throat Lips, teeth, and gums WNL.Marland Kitchen Moist mucosa without lesions . Neck supple and nontender. No palpable supraclavicular or cervical adenopathy. Normal sized without goiter. Respiratory WNL. No retractions.. Cardiovascular Pedal Pulses WNL. No clubbing, cyanosis or edema. Gastrointestinal (GI) Abdomen without masses or tenderness.. No liver or spleen enlargement or tenderness.. Lymphatic No adneopathy. No adenopathy. No adenopathy. Musculoskeletal Adexa without tenderness or enlargement.. Digits and nails w/o clubbing, cyanosis, infection, petechiae, ischemia, or inflammatory conditions.Marland Kitchen Psychiatric Judgement and insight Intact.. No evidence of depression, anxiety, or  agitation.. General Notes: Sshe got several areas of eschar covering the central abdomen area where she's had previous mesh and skin graft. Once all these areas were removed there is only one area with significant slough and ulceration and this was sharply debrided. Integumentary (Hair, Skin) No suspicious lesions. No crepitus or fluctuance. No peri-wound warmth or erythema. No masses.Marland Kitchen SHALIYAH, TAITE ([id number]) Wound #8 status is Open. Original cause of wound was Not Known. The wound is located on the Medial Abdomen - midline. The wound measures 0.8cm length x 0.6cm width x 0.2cm depth; 0.377cm^2 area and 0.075cm^3 volume. The wound is limited to skin breakdown. There is no tunneling or undermining noted. There is a small amount of purulent drainage noted. The wound margin is flat and intact. There is no granulation within the wound bed. There is a large (67-100%) amount of necrotic tissue within the wound bed including Eschar and Adherent Slough. The periwound skin appearance did not exhibit: Callus, Crepitus, Excoriation, Fluctuance, Friable, Induration, Localized Edema, Rash, Scarring, Dry/Scaly, Maceration, Moist, Atrophie Blanche, Cyanosis, Ecchymosis, Hemosiderin Staining, Mottled, Pallor, Rubor, Erythema. Periwound temperature was noted as No Abnormality. Assessment Active Problems ICD-10 E11.622 - Type 2 diabetes mellitus with other skin ulcer S31.102S - Unspecified open wound of abdominal wall, epigastric region without penetration into peritoneal cavity, sequela E66.09 - Other obesity due to excess calories She has done very well with Acticoat or silver alginate and we will start using this on alternate days. her nutrition has been addressed and all other methods of offloading this area has been discussed. She will come back and see as next week. Procedures Wound #  8 Wound #8 is an Atypical located on the Medial Abdomen - midline . There was a Skin/Subcutaneous  Tissue Debridement ([id number]) debridement with total area of 0.48 sq cm performed by Keonda Dow, Ignacia Felling., MD. with the following instrument(s): Forceps to remove Viable and Non-Viable tissue/material including Fibrin/Slough, Eschar, and Subcutaneous after achieving pain control using Lidocaine 4% Topical Solution. A time out was conducted prior to the start of the procedure. A Minimum amount of bleeding was controlled with Pressure. The procedure was tolerated well with a pain level of 0 throughout and a pain level of 0 following the procedure. Post Debridement Measurements: 0.8cm length x 0.6cm width x 0.2cm depth; 0.075cm^3 volume. Post procedure Diagnosis Wound #8: Same as Pre-Procedure Alcala, Nasiya O. ([id number]) Plan Wound Cleansing: Wound #8 Medial Abdomen - midline: Clean wound with Normal Saline. Anesthetic: Wound #8 Medial Abdomen - midline: Topical Lidocaine 4% cream applied to wound bed prior to debridement Skin Barriers/Peri-Wound Care: Wound #8 Medial Abdomen - midline: Skin Prep Primary Wound Dressing: Wound #8 Medial Abdomen - midline: Aquacel Ag Secondary Dressing: Wound #8 Medial Abdomen - midline: Boardered Foam Dressing Dressing Change Frequency: Wound #8 Medial Abdomen - midline: Change Dressing Monday, Wednesday, Friday Follow-up Appointments: Wound #8 Medial Abdomen - midline: Return Appointment in 2 weeks. Home Health: Wound #8 Medial Abdomen - midline: Continue Home Health Visits - [facility] Nurse may visit PRN to address patient s wound care needs. FACE TO FACE ENCOUNTER: MEDICARE and MEDICAID PATIENTS: I certify that this patient is under my care and that I had a face-to-face encounter that meets the physician face-to-face encounter requirements with this patient on this date. The encounter with the patient was in whole or in part for the following MEDICAL CONDITION: (primary reason for Home Healthcare) MEDICAL NECESSITY: I certify, that  based on my findings, NURSING services are a medically necessary home health service. HOME BOUND STATUS: I certify that my clinical findings support that this patient is homebound (i.e., Due to illness or injury, pt requires aid of supportive devices such as crutches, cane, wheelchairs, walkers, the use of special transportation or the assistance of another person to leave their place of residence. There is a normal inability to leave the home and doing so requires considerable and taxing effort. Other absences are for medical reasons / religious services and are infrequent or of short duration when for other reasons). If current dressing causes regression in wound condition, may D/C ordered dressing product/s and apply Normal Saline Moist Dressing daily until next Wound Healing Center / Other MD appointment. Notify Wound Healing Center of regression in wound condition at [phone]. Please direct any NON-WOUND related issues/requests for orders to patient's Primary Care Physician OKIE, JANSSON ([id number]) She has done very well with Acticoat or silver alginate and we will start using this on alternate days. her nutrition has been addressed and all other methods of offloading this area has been discussed. She will come back and see as next week. Electronic Signature(s) Signed: 05/05/2015 1:56:55 PM By: Evlyn Kanner MD, FACS Entered By: Evlyn Kanner on 05/05/2015 13:56:55 BRANNON, DECAIRE ([id number]) -------------------------------------------------------------------------------- ROS/PFSH Details Patient Name: [name] Date of Service: 05/05/2015 1:00 PM Medical Record Number: [id number] Patient Account Number: [id number] Date of Birth/Sex: 02/22/1929 (81 y.o. Female) Treating RN: Curtis Sites Primary Care Physician: Ronna Polio Other Clinician: Referring Physician: Ronna Polio Treating Physician/Extender: Rudene Re in Treatment: 0 Information Obtained  From Patient Wound History Do you currently have one or more  open woundso Yes How many open wounds do you currently haveo 1 Has your wound(s) ever healed and then re-openedo No Have you had any lab work done in the past montho Yes Who ordered the lab work [employer] Have you tested positive for an antibiotic resistant organism (MRSA, VRE)o No Have you tested positive for osteomyelitis (bone infection)o No Have you had any tests for circulation on your legso No Eyes Medical History: Positive for: Cataracts - left eye Respiratory Medical History: Negative for: Asthma Past Medical History Notes: occassional wheezing Cardiovascular Medical History: Positive for: Hypertension Endocrine Medical History: Positive for: Type II Diabetes Treated with: Insulin Blood sugar tested every day: Yes Tested : 3 x times a day Blood sugar testing results: Breakfast: 100 Musculoskeletal Medical History: Past Medical History NotesADLEE, PAAR ([id number]) arthritis HBO Extended History Items Eyes: Cataracts Immunizations Immunization Notes: estimated 10 years ago per patient Hospitalization / Surgery History Name of Hospital Purpose of Hospitalization/Surgery Date [facility] stints placed in heart 08/30/2013 [facility] GI bleed 03/12/2015 Family and Social History Cancer: Yes - Father; Diabetes: Yes - Mother; Heart Disease: Yes - Mother, Father; Hereditary Spherocytosis: No; Hypertension: No; Kidney Disease: No; Lung Disease: No; Seizures: No; Stroke: No; Thyroid Problems: No; Tuberculosis: No; Never smoker; Marital Status - Widowed; Alcohol Use: Never; Drug Use: No History; Caffeine Use: Never; Financial Concerns: No; Food, Clothing or Shelter Needs: No; Support System Lacking: No; Transportation Concerns: No; Advanced Directives: Yes (Not Provided); Patient does not want information on Advanced Directives; Living Will: Yes (Not Provided); Medical Power of Attorney: Yes (Not Provided) Physician  Affirmation I have reviewed and agree with the above information. Electronic Signature(s) Signed: 05/05/2015 1:55:47 PM By: Evlyn Kanner MD, FACS Signed: 05/05/2015 4:50:36 PM By: Curtis Sites Entered By: Evlyn Kanner on 05/05/2015 13:55:47 Reppert, [name] ([id number]) -------------------------------------------------------------------------------- SuperBill Details Patient Name: [name] Date of Service: 05/05/2015 Medical Record Number: [id number] Patient Account Number: [id number] Date of Birth/Sex: 02-10-1929 (81 y.o. Female) Treating RN: Curtis Sites Primary Care Physician: Ronna Polio Other Clinician: Referring Physician: Ronna Polio Treating Physician/Extender: Rudene Re in Treatment: 0 Diagnosis Coding ICD-10 Codes Code Description [phone] Type 2 diabetes mellitus with other skin ulcer Unspecified open wound of abdominal wall, epigastric region without penetration into S31.102S peritoneal cavity, sequela E66.09 Other obesity due to excess calories Facility Procedures CPT4: Description Modifier Quantity Code [id number] - WOUND CARE VISIT-LEV 3 EST PT 1 CPT4: 84132440 11042 - DEB SUBQ TISSUE 20 SQ CM/< 1 ICD-10 Description Diagnosis E11.622 Type 2 diabetes mellitus with other skin ulcer S31.102S Unspecified open wound of abdominal wall, epigastric region without penetration into peritoneal cavity,  sequela E66.09 Other obesity due to excess calories Physician Procedures CPT4: Description Modifier Quantity Code 1027253 [id number] - WC PHYS LEVEL 4 - EST PT 1 ICD-10 Description Diagnosis E11.622 Type 2 diabetes mellitus with other skin ulcer S31.102S Unspecified open wound of abdominal wall, epigastric region without  penetration into peritoneal cavity, sequela E66.09 Other obesity due to excess calories CPT4: 6644034 11042 - WC PHYS SUBQ TISS 20 SQ CM 1 ICD-10 Description Diagnosis E11.622 Type 2 diabetes mellitus with other skin ulcer KEIKO, MYRICKS  ([id number]) Electronic Signature(s) Signed: 05/05/2015 1:57:26 PM By: Evlyn Kanner MD, FACS Entered By: Evlyn Kanner on 05/05/2015 13:57:26

## 2015-05-06 NOTE — Progress Notes (Signed)
[name], [name] ([id number]) Visit Report for 05/05/2015 Allergy List Details Patient Name: [name], [name]. Date of Service: 05/05/2015 1:00 PM Medical Record Number: [id number] Patient Account Number: [id number] Date of Birth/Sex: 1929/08/18 (81 y.o. Female) Treating RN: Curtis Sites Primary Care Physician: Ronna Polio Other Clinician: Referring Physician: Ronna Polio Treating Physician/Extender: Rudene Re in Treatment: 0 Allergies Active Allergies Benadryl Reaction: hyperactive Severity: Moderate adhesive tape Allergy Notes Electronic Signature(s) Signed: 05/05/2015 4:50:36 PM By: Curtis Sites Entered By: Curtis Sites on 05/05/2015 13:18:32 Sherrill, [name] ([id number]) -------------------------------------------------------------------------------- Arrival Information Details Patient Name: [name] Date of Service: 05/05/2015 1:00 PM Medical Record Number: [id number] Patient Account Number: [id number] Date of Birth/Sex: September 26, 1928 (81 y.o. Female) Treating RN: Curtis Sites Primary Care Physician: Ronna Polio Other Clinician: Referring Physician: Ronna Polio Treating Physician/Extender: Rudene Re in Treatment: 0 Visit Information Patient Arrived: Walker Arrival Time: 13:15 Accompanied By: self Transfer Assistance: None Patient Identification Verified: Yes Secondary Verification Process Yes Completed: Patient Has Alerts: Yes Patient Alerts: Patient on Blood Thinner Eliquis History Since Last Visit Added or deleted any medications: No Any new allergies or adverse reactions: No Had a fall or experienced change in activities of daily living that may affect risk of falls: No Signs or symptoms of abuse/neglect since last visito No Hospitalized since last visit: No Electronic Signature(s) Signed: 05/05/2015 4:50:36 PM By: Curtis Sites Entered By: Curtis Sites on 05/05/2015 13:17:57 Newcomer, [name]  ([id number]) -------------------------------------------------------------------------------- Clinic Level of Care Assessment Details Patient Name: [name] Date of Service: 05/05/2015 1:00 PM Medical Record Number: [id number] Patient Account Number: [id number] Date of Birth/Sex: 1929/06/15 (81 y.o. Female) Treating RN: Curtis Sites Primary Care Physician: Ronna Polio Other Clinician: Referring Physician: Ronna Polio Treating Physician/Extender: Rudene Re in Treatment: 0 Clinic Level of Care Assessment Items TOOL 1 Quantity Score []  - Use when EandM and Procedure is performed on INITIAL visit 0 ASSESSMENTS - Nursing Assessment / Reassessment X - General Physical Exam (combine w/ comprehensive assessment (listed just 1 20 below) when performed on new pt. evals) X - Comprehensive Assessment (HX, ROS, Risk Assessments, Wounds Hx, etc.) 1 25 ASSESSMENTS - Wound and Skin Assessment / Reassessment []  - Dermatologic / Skin Assessment (not related to wound area) 0 ASSESSMENTS - Ostomy and/or Continence Assessment and Care []  - Incontinence Assessment and Management 0 []  - Ostomy Care Assessment and Management (repouching, etc.) 0 PROCESS - Coordination of Care X - Simple Patient / Family Education for ongoing care 1 15 []  - Complex (extensive) Patient / Family Education for ongoing care 0 X - Staff obtains Chiropractor, Records, Test Results / Process Orders 1 10 []  - Staff telephones HHA, Nursing Homes / Clarify orders / etc 0 []  - Routine Transfer to another Facility (non-emergent condition) 0 []  - Routine Hospital Admission (non-emergent condition) 0 X - New Admissions / Manufacturing engineer / Ordering NPWT, Apligraf, etc. 1 15 []  - Emergency Hospital Admission (emergent condition) 0 PROCESS - Special Needs []  - Pediatric / Minor Patient Management 0 []  - Isolation Patient Management 0 Streeper, Annaleigh O. ([id number]) []  - Hearing / Language / Visual special  needs 0 []  - Assessment of Community assistance (transportation, D/C planning, etc.) 0 []  - Additional assistance / Altered mentation 0 []  - Support Surface(s) Assessment (bed, cushion, seat, etc.) 0 INTERVENTIONS - Miscellaneous []  - External ear exam 0 []  - Patient Transfer (multiple staff / Nurse, adult / Similar devices) 0 []  - Simple Staple / Suture removal (25 or  less) 0 []  - Complex Staple / Suture removal (26 or more) 0 []  - Hypo/Hyperglycemic Management (do not check if billed separately) 0 []  - Ankle / Brachial Index (ABI) - do not check if billed separately 0 Has the patient been seen at the hospital within the last three years: Yes Total Score: 85 Level Of Care: New/Established - Level 3 Electronic Signature(s) Signed: 05/05/2015 4:50:36 PM By: Curtis Sites Entered By: Curtis Sites on 05/05/2015 13:50:06 Schoffstall, [name] ([id number]) -------------------------------------------------------------------------------- Encounter Discharge Information Details Patient Name: [name] Date of Service: 05/05/2015 1:00 PM Medical Record Number: [id number] Patient Account Number: [id number] Date of Birth/Sex: Jul 25, 1929 (81 y.o. Female) Treating RN: Curtis Sites Primary Care Physician: Ronna Polio Other Clinician: Referring Physician: Ronna Polio Treating Physician/Extender: Rudene Re in Treatment: 0 Encounter Discharge Information Items Discharge Pain Level: 0 Discharge Condition: Stable Ambulatory Status: Walker Discharge Destination: Home Transportation: Private Auto Accompanied By: self Schedule Follow-up Appointment: Yes Medication Reconciliation completed No and provided to Patient/Care Dennie Vecchio: Provided on Clinical Summary of Care: 05/05/2015 Form Type Recipient Paper Patient CL Electronic Signature(s) Signed: 05/05/2015 1:57:02 PM By: Gwenlyn Perking Entered By: Gwenlyn Perking on 05/05/2015 13:57:02 Bayles, [name]  ([id number]) -------------------------------------------------------------------------------- Multi Wound Chart Details Patient Name: [name] Date of Service: 05/05/2015 1:00 PM Medical Record Number: [id number] Patient Account Number: [id number] Date of Birth/Sex: 12/31/1928 (81 y.o. Female) Treating RN: Curtis Sites Primary Care Physician: Ronna Polio Other Clinician: Referring Physician: Ronna Polio Treating Physician/Extender: Rudene Re in Treatment: 0 Vital Signs Height(in): 59 Pulse(bpm): 54 Weight(lbs): 180 Blood Pressure 164/63 (mmHg): Body Mass Index(BMI): 36 Temperature(F): 98.2 Respiratory Rate 18 (breaths/min): Photos: [8:No Photos] [N/A:N/A] Wound Location: [8:Abdomen - midline - Medial] [N/A:N/A] Wounding Event: [8:Not Known] [N/A:N/A] Primary Etiology: [8:Atypical] [N/A:N/A] Comorbid History: [8:Cataracts, Hypertension, Type II Diabetes] [N/A:N/A] Date Acquired: [8:04/23/2003] [N/A:N/A] Weeks of Treatment: [8:0] [N/A:N/A] Wound Status: [8:Open] [N/A:N/A] Clustered Wound: [8:Yes] [N/A:N/A] Measurements L x W x D 0.8x0.6x0.2 [N/A:N/A] (cm) Area (cm) : [8:0.377] [N/A:N/A] Volume (cm) : [8:0.075] [N/A:N/A] % Reduction in Area: [8:0.00%] [N/A:N/A] % Reduction in Volume: 0.00% [N/A:N/A] Classification: [8:Full Thickness Without Exposed Support Structures] [N/A:N/A] Exudate Amount: [8:Small] [N/A:N/A] Exudate Type: [8:Purulent] [N/A:N/A] Exudate Color: [8:yellow, brown, green] [N/A:N/A] Wound Margin: [8:Flat and Intact] [N/A:N/A] Granulation Amount: [8:None Present (0%)] [N/A:N/A] Necrotic Amount: [8:Large (67-100%)] [N/A:N/A] Necrotic Tissue: [8:Eschar, Adherent Slough] [N/A:N/A] Exposed Structures: [8:Fascia: No Fat: No Tendon: No] [N/A:N/A] Muscle: No Joint: No Bone: No Limited to Skin Breakdown Epithelialization: None N/A N/A Periwound Skin Texture: Edema: No N/A N/A Excoriation: No Induration: No Callus:  No Crepitus: No Fluctuance: No Friable: No Rash: No Scarring: No Periwound Skin Maceration: No N/A N/A Moisture: Moist: No Dry/Scaly: No Periwound Skin Color: Atrophie Blanche: No N/A N/A Cyanosis: No Ecchymosis: No Erythema: No Hemosiderin Staining: No Mottled: No Pallor: No Rubor: No Temperature: No Abnormality N/A N/A Tenderness on No N/A N/A Palpation: Wound Preparation: Ulcer Cleansing: N/A N/A Rinsed/Irrigated with Saline Topical Anesthetic Applied: Other: lidocaine 4% Treatment Notes Electronic Signature(s) Signed: 05/05/2015 1:36:54 PM By: Curtis Sites Entered By: Curtis Sites on 05/05/2015 13:36:54 Beaird, [name] ([id number]) -------------------------------------------------------------------------------- Multi-Disciplinary Care Plan Details Patient Name: [name] Date of Service: 05/05/2015 1:00 PM Medical Record Number: [id number] Patient Account Number: [id number] Date of Birth/Sex: 1928/11/25 (81 y.o. Female) Treating RN: Curtis Sites Primary Care Physician: Ronna Polio Other Clinician: Referring Physician: Ronna Polio Treating Physician/Extender: Rudene Re in Treatment: 0 Active Inactive Abuse / Safety / Falls / Self Care Management Nursing  Diagnoses: Impaired physical mobility Potential for falls Goals: Patient will remain injury free Date Initiated: 05/05/2015 Goal Status: Active Interventions: Assess fall risk on admission and as needed Notes: Wound/Skin Impairment Nursing Diagnoses: Impaired tissue integrity Goals: Ulcer/skin breakdown will have a volume reduction of 30% by week 4 Date Initiated: 05/05/2015 Goal Status: Active Ulcer/skin breakdown will have a volume reduction of 50% by week 8 Date Initiated: 05/05/2015 Goal Status: Active Ulcer/skin breakdown will have a volume reduction of 80% by week 12 Date Initiated: 05/05/2015 Goal Status: Active Ulcer/skin breakdown will heal within 14 weeks Date  Initiated: 05/05/2015 Goal Status: Active Interventions: Assess ulceration(s) every visit KELLYANNE, ELLWANGER ([id number]) Provide education on ulcer and skin care Notes: Electronic Signature(s) Signed: 05/05/2015 1:36:40 PM By: Curtis Sites Entered By: Curtis Sites on 05/05/2015 13:36:40 Purdie, [name] ([id number]) -------------------------------------------------------------------------------- Patient/Caregiver Education Details Patient Name: [name] Date of Service: 05/05/2015 1:00 PM Medical Record Number: [id number] Patient Account Number: [id number] Date of Birth/Gender: [birth year] (81 y.o. Female) Treating RN: Curtis Sites Primary Care Physician: Ronna Polio Other Clinician: Referring Physician: Ronna Polio Treating Physician/Extender: Rudene Re in Treatment: 0 Education Assessment Education Provided To: Patient Education Topics Provided Wound/Skin Impairment: Handouts: Other: wound care and reportable s/s Methods: Demonstration, Explain/Verbal Responses: State content correctly Electronic Signature(s) Signed: 05/05/2015 4:50:36 PM By: Curtis Sites Entered By: Curtis Sites on 05/05/2015 14:00:39 Holecek, [name] ([id number]) -------------------------------------------------------------------------------- Wound Assessment Details Patient Name: [name] Date of Service: 05/05/2015 1:00 PM Medical Record Number: [id number] Patient Account Number: [id number] Date of Birth/Sex: March 19, 1929 (81 y.o. Female) Treating RN: Curtis Sites Primary Care Physician: Ronna Polio Other Clinician: Referring Physician: Ronna Polio Treating Physician/Extender: Rudene Re in Treatment: 0 Wound Status Wound Number: 8 Primary Atypical Etiology: Wound Location: Abdomen - midline - Medial Wound Status: Open Wounding Event: Not Known Comorbid Cataracts, Hypertension, Type II Date Acquired: 04/23/2003 History: Diabetes Weeks Of  Treatment: 0 Clustered Wound: Yes Photos Photo Uploaded By: Curtis Sites on 05/05/2015 16:50:18 Wound Measurements Length: (cm) 0.8 Width: (cm) 0.6 Depth: (cm) 0.2 Area: (cm) 0.377 Volume: (cm) 0.075 % Reduction in Area: 0% % Reduction in Volume: 0% Epithelialization: None Tunneling: No Undermining: No Wound Description Full Thickness Without Exposed Classification: Support Structures Wound Margin: Flat and Intact Exudate Small Amount: Exudate Type: Purulent Exudate Color: yellow, brown, green Foul Odor After Cleansing: No Wound Bed Granulation Amount: None Present (0%) Exposed Structure Necrotic Amount: Large (67-100%) Fascia Exposed: No Necrotic Quality: Eschar, Adherent Slough Fat Layer Exposed: No Messamore, Lei O. ([id number]) Tendon Exposed: No Muscle Exposed: No Joint Exposed: No Bone Exposed: No Limited to Skin Breakdown Periwound Skin Texture Texture Color No Abnormalities Noted: No No Abnormalities Noted: No Callus: No Atrophie Blanche: No Crepitus: No Cyanosis: No Excoriation: No Ecchymosis: No Fluctuance: No Erythema: No Friable: No Hemosiderin Staining: No Induration: No Mottled: No Localized Edema: No Pallor: No Rash: No Rubor: No Scarring: No Temperature / Pain Moisture Temperature: No Abnormality No Abnormalities Noted: No Dry / Scaly: No Maceration: No Moist: No Wound Preparation Ulcer Cleansing: Rinsed/Irrigated with Saline Topical Anesthetic Applied: Other: lidocaine 4%, Treatment Notes Wound #8 (Medial Abdomen - midline) 1. Cleansed with: Clean wound with Normal Saline 2. Anesthetic Topical Lidocaine 4% cream to wound bed prior to debridement 3. Peri-wound Care: Skin Prep 4. Dressing Applied: Aquacel Ag 5. Secondary Dressing Applied Bordered Foam Dressing Electronic Signature(s) Signed: 05/05/2015 4:50:36 PM By: Curtis Sites Entered By: Curtis Sites on 05/05/2015 13:30:33 Inch, [name]  ([id number]) -------------------------------------------------------------------------------- Vitals  Details Patient Name: LISSETE, MAESTAS. Date of Service: 05/05/2015 1:00 PM Medical Record Number: [id number] Patient Account Number: [id number] Date of Birth/Sex: Jul 21, 1929 (81 y.o. Female) Treating RN: Curtis Sites Primary Care Physician: Ronna Polio Other Clinician: Referring Physician: Ronna Polio Treating Physician/Extender: Rudene Re in Treatment: 0 Vital Signs Time Taken: 13:20 Temperature (F): 98.2 Height (in): 59 Pulse (bpm): 54 Source: Stated Respiratory Rate (breaths/min): 18 Weight (lbs): 180 Blood Pressure (mmHg): 164/63 Source: Stated Reference Range: 80 - 120 mg / dl Body Mass Index (BMI): 36.4 Electronic Signature(s) Signed: 05/05/2015 4:50:36 PM By: Curtis Sites Entered By: Curtis Sites on 05/05/2015 13:24:06

## 2015-05-11 ENCOUNTER — Ambulatory Visit
Admission: RE | Admit: 2015-05-11 | Discharge: 2015-05-11 | Disposition: A | Discharge status: Home / Self Care | Payer: Medicare Other | Source: Ambulatory Visit | Attending: Internal Medicine | Admitting: Internal Medicine

## 2015-05-11 ENCOUNTER — Encounter: Payer: Self-pay | Admitting: Internal Medicine

## 2015-05-11 ENCOUNTER — Ambulatory Visit (INDEPENDENT_AMBULATORY_CARE_PROVIDER_SITE_OTHER): Payer: Medicare Other | Admitting: Internal Medicine

## 2015-05-11 VITALS — BP 150/71 | HR 68 | Temp 97.7°F | Ht 59.0 in | Wt 177.2 lb

## 2015-05-11 DIAGNOSIS — M1710 Unilateral primary osteoarthritis, unspecified knee: Secondary | ICD-10-CM

## 2015-05-11 DIAGNOSIS — Z23 Encounter for immunization: Secondary | ICD-10-CM | POA: Diagnosis not present

## 2015-05-11 DIAGNOSIS — I639 Cerebral infarction, unspecified: Secondary | ICD-10-CM | POA: Diagnosis not present

## 2015-05-11 DIAGNOSIS — M79601 Pain in right arm: Secondary | ICD-10-CM | POA: Diagnosis not present

## 2015-05-11 DIAGNOSIS — IMO0002 Reserved for concepts with insufficient information to code with codable children: Secondary | ICD-10-CM

## 2015-05-11 DIAGNOSIS — M179 Osteoarthritis of knee, unspecified: Secondary | ICD-10-CM

## 2015-05-11 DIAGNOSIS — E119 Type 2 diabetes mellitus without complications: Secondary | ICD-10-CM | POA: Diagnosis not present

## 2015-05-11 MED ORDER — CYANOCOBALAMIN 1000 MCG/ML IJ SOLN: 1000.0000 ug | INTRAMUSCULAR | Status: DC

## 2015-05-11 MED ORDER — HYDROCODONE-ACETAMINOPHEN 5-325 MG PO TABS: 1.0000 | ORAL_TABLET | Freq: Three times a day (TID) | ORAL | Status: DC | PRN

## 2015-05-11 NOTE — Patient Instructions (Addendum)
Labs today.  Please go to [facility] for xray of right arm.  Follow up in 4 weeks.

## 2015-05-11 NOTE — Assessment & Plan Note (Signed)
Severe bilateral OA. Unable to take NSAIDS. No improvement with Tylenol. Will Continue Vicodin 3times daily. Controlled substance contract on file. She understands risk of narcotic use.

## 2015-05-11 NOTE — Progress Notes (Signed)
Subjective:    Patient ID: [name], female    DOB: 11-13-1928, 81 y.o.   MRN: [id number]  HPI  81YO female presents for follow up.  DM - BG elevated near 200 mostly with few near 90. Compliant with Lantus 65units daily and using Humalog SSI.  Fell onto right arm at home, onto sofa. Has some aching pain in right upper arm. Some weakness with abduction. Taking chronic Hydrocodone with improvement in pain.  Wt Readings from Last 3 Encounters:  05/11/15 177 lb 4 oz (80.4 kg)  03/17/15 178 lb 6 oz (80.91 kg)  03/12/15 190 lb (86.183 kg)   BP Readings from Last 3 Encounters:  05/11/15 150/71  04/07/15 124/82  03/17/15 147/83     Past Medical History  Diagnosis Date  . Anxiety   . Chronic diastolic CHF (congestive heart failure)   . Depression   . Diabetes mellitus     Followed by Dr. Tedd Sias at [facility]  . GERD (gastroesophageal reflux disease)   . Hypertension   . Hypothyroidism   . Osteoporosis   . Allergy   . Hyperlipidemia   . RLS (restless legs syndrome)   . Pancreatic disease     pancreatic failure  . Urinary incontinence   . Pancreatitis     Pancreatic resection/ open wound- [facility] 11/04  . Pneumonia     Pneumonia/ Emphysema 05/05  . 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 TIA (transient ischemic attack)     recurrent 12/15  . History of pleural empyema   . Sepsis   . Frequent falls   . Arthritis     left knee, [facility]  . CAD (coronary artery disease), native coronary artery      1//15  T mid-LAD  stent OM2  . Parkinson's disease    Family History  Problem Relation Age of Onset  . Heart disease Mother   . Heart attack Mother   . Hypertension Mother   . Heart attack Father    Past Surgical History  Procedure Laterality Date  . Breast lumpectomy      Lumpectomy right breast  . 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 Abdominal hysterectomy     Hysteroscopy/ D & C ([facility]) 12/05  . Removal of pancreas  2004  . Kyphoplasty  2005  . Broken shoulder and orbital bone  2005  . Cholecystectomy  1973  . Thoracentesis    . Vaginal delivery      9  . Cardiac catheterization  08/29/2013    x1 stent @ [facility]  . Cataract surgery     Social History   Social History  . Marital Status: Widowed    Spouse Name: N/A  . Number of Children: 7  . Years of Education: 6   Occupational History  . retired- Veterinary surgeon    Social History Main Topics  . Smoking status: Never Smoker   . Smokeless tobacco: Never Used  . Alcohol Use: No  . Drug Use: No  . Sexual Activity: Not Asked   Other Topics Concern  . None   Social History Narrative   Lives in home alone, has nursing aid, and alert device in [place]. Has 7 children.            Has [name], daughter, as health care POA   Not sure about DNR   Permission to speak to  daughter-in-law, Dashea Mcmullan, regarding health care matters.    Review of Systems  Constitutional: Negative for fever, chills, appetite change, fatigue and unexpected weight change.  Eyes: Negative for visual disturbance.  Respiratory: Negative for shortness of breath.   Cardiovascular: Negative for chest pain and leg swelling.  Gastrointestinal: Negative for nausea, vomiting, abdominal pain, diarrhea and constipation.  Musculoskeletal: Positive for myalgias, back pain and arthralgias.  Skin: Negative for color change and rash.  Hematological: Negative for adenopathy. Does not bruise/bleed easily.  Psychiatric/Behavioral: Negative for sleep disturbance and dysphoric mood. The patient is not nervous/anxious.        Objective:    BP 150/71 mmHg  Pulse 68  Temp(Src) 97.7 F (36.5 C) (Oral)  Ht  (1.499 m)  Wt 177 lb 4 oz (80.4 kg)  BMI 35.78 kg/m2  SpO2 98% Physical Exam  Constitutional: She is oriented to person, place, and time. She appears well-developed and well-nourished. No distress.  HENT:    Head: Normocephalic and atraumatic.  Right Ear: External ear normal.  Left Ear: External ear normal.  Nose: Nose normal.  Mouth/Throat: Oropharynx is clear and moist. No oropharyngeal exudate.  Eyes: Conjunctivae are normal. Pupils are equal, round, and reactive to light. Right eye exhibits no discharge. Left eye exhibits no discharge. No scleral icterus.  Neck: Normal range of motion. Neck supple. No tracheal deviation present. No thyromegaly present.  Cardiovascular: Normal rate, regular rhythm, normal heart sounds and intact distal pulses.  Exam reveals no gallop and no friction rub.   No murmur heard. Pulmonary/Chest: Effort normal and breath sounds normal. No respiratory distress. She has no wheezes. She has no rales. She exhibits no tenderness.  Musculoskeletal: She exhibits no edema.       Right shoulder: She exhibits decreased range of motion, tenderness, bony tenderness, pain and decreased strength.       Arms: Lymphadenopathy:    She has no cervical adenopathy.  Neurological: She is alert and oriented to person, place, and time. No cranial nerve deficit. She exhibits normal muscle tone. Coordination normal.  Skin: Skin is warm and dry. No rash noted. She is not diaphoretic. No erythema. No pallor.  Psychiatric: She has a normal mood and affect. Her behavior is normal. Judgment and thought content normal.          Assessment & Plan:   Problem List Items Addressed This Visit      High   Diabetes type 2, controlled - Primary (Chronic)    BG continue to be on high side, however reluctant to increase Lantus given some lower BG near 90.      Relevant Orders   Comprehensive metabolic panel   Hemoglobin A1c     Unprioritized   Osteoarthrosis, unspecified whether generalized or localized, involving lower leg    Severe bilateral OA. Unable to take NSAIDS. No improvement with Tylenol. Will Continue Vicodin 3times daily. Controlled substance contract on file. She understands  risk of narcotic use.        Relevant Medications   HYDROcodone-acetaminophen (NORCO/VICODIN) 5-325 MG per tablet   Right arm pain    Focal right arm pain at site of injury. Will get plain xray. If xray normal, discussed evaluation with MRI. Continue Hydrocodone prn severe pain.      Relevant Orders   DG Humerus Right    Other Visit Diagnoses    Encounter for immunization            Return in about 4 weeks (around  06/08/2015) for Recheck.

## 2015-05-11 NOTE — Progress Notes (Signed)
Pre visit review using our clinic review tool, if applicable. No additional management support is needed unless otherwise documented below in the visit note. 

## 2015-05-11 NOTE — Assessment & Plan Note (Signed)
Focal right arm pain at site of injury. Will get plain xray. If xray normal, discussed evaluation with MRI. Continue Hydrocodone prn severe pain.

## 2015-05-11 NOTE — Assessment & Plan Note (Signed)
BG continue to be on high side, however reluctant to increase Lantus given some lower BG near 90.

## 2015-05-12 LAB — COMPREHENSIVE METABOLIC PANEL
ALT: 7 U/L (ref 0–35)
AST: 12 U/L (ref 0–37)
Albumin: 3.8 g/dL (ref 3.5–5.2)
Alkaline Phosphatase: 80 U/L (ref 39–117)
BUN: 21 mg/dL (ref 6–23)
CALCIUM: 9 mg/dL (ref 8.4–10.5)
CHLORIDE: 99 meq/L (ref 96–112)
CO2: 31 meq/L (ref 19–32)
Creatinine, Ser: 1.23 mg/dL — ABNORMAL HIGH (ref 0.40–1.20)
GFR: 44.23 mL/min — ABNORMAL LOW (ref >60.00)
Glucose, Bld: 297 mg/dL — ABNORMAL HIGH (ref 70–99)
POTASSIUM: 4.1 meq/L (ref 3.5–5.1)
Sodium: 142 mEq/L (ref 135–145)
Total Bilirubin: 0.4 mg/dL (ref 0.2–1.2)
Total Protein: 6.6 g/dL (ref 6.0–8.3)

## 2015-05-12 LAB — HEMOGLOBIN A1C: Hgb A1c MFr Bld: 6.6 % — ABNORMAL HIGH (ref 4.6–6.5)

## 2015-05-13 ENCOUNTER — Encounter: Payer: Self-pay | Admitting: Internal Medicine

## 2015-05-14 ENCOUNTER — Encounter: Payer: Self-pay | Admitting: Internal Medicine

## 2015-05-14 ENCOUNTER — Ambulatory Visit (INDEPENDENT_AMBULATORY_CARE_PROVIDER_SITE_OTHER): Payer: Medicare Other | Admitting: *Deleted

## 2015-05-14 DIAGNOSIS — I638 Other cerebral infarction: Secondary | ICD-10-CM

## 2015-05-14 DIAGNOSIS — I6389 Other cerebral infarction: Secondary | ICD-10-CM

## 2015-05-14 LAB — CUP PACEART REMOTE DEVICE CHECK: Date Time Interrogation Session: 20160922160550

## 2015-05-17 ENCOUNTER — Emergency Department
Admission: EM | Admit: 2015-05-17 | Discharge: 2015-05-17 | Disposition: A | Discharge status: Home / Self Care | Payer: Medicare Other | Attending: Emergency Medicine | Admitting: Emergency Medicine

## 2015-05-17 ENCOUNTER — Emergency Department: Payer: Medicare Other

## 2015-05-17 DIAGNOSIS — E119 Type 2 diabetes mellitus without complications: Secondary | ICD-10-CM | POA: Diagnosis not present

## 2015-05-17 DIAGNOSIS — Y9289 Other specified places as the place of occurrence of the external cause: Secondary | ICD-10-CM | POA: Diagnosis not present

## 2015-05-17 DIAGNOSIS — Y998 Other external cause status: Secondary | ICD-10-CM | POA: Diagnosis not present

## 2015-05-17 DIAGNOSIS — Z792 Long term (current) use of antibiotics: Secondary | ICD-10-CM | POA: Insufficient documentation

## 2015-05-17 DIAGNOSIS — I129 Hypertensive chronic kidney disease with stage 1 through stage 4 chronic kidney disease, or unspecified chronic kidney disease: Secondary | ICD-10-CM | POA: Insufficient documentation

## 2015-05-17 DIAGNOSIS — Z7951 Long term (current) use of inhaled steroids: Secondary | ICD-10-CM | POA: Diagnosis not present

## 2015-05-17 DIAGNOSIS — X58XXXA Exposure to other specified factors, initial encounter: Secondary | ICD-10-CM | POA: Diagnosis not present

## 2015-05-17 DIAGNOSIS — S76219A Strain of adductor muscle, fascia and tendon of unspecified thigh, initial encounter: Secondary | ICD-10-CM

## 2015-05-17 DIAGNOSIS — N183 Chronic kidney disease, stage 3 (moderate): Secondary | ICD-10-CM | POA: Diagnosis not present

## 2015-05-17 DIAGNOSIS — Y9389 Activity, other specified: Secondary | ICD-10-CM | POA: Insufficient documentation

## 2015-05-17 DIAGNOSIS — Z79899 Other long term (current) drug therapy: Secondary | ICD-10-CM | POA: Insufficient documentation

## 2015-05-17 DIAGNOSIS — Z794 Long term (current) use of insulin: Secondary | ICD-10-CM | POA: Diagnosis not present

## 2015-05-17 DIAGNOSIS — R1030 Lower abdominal pain, unspecified: Secondary | ICD-10-CM | POA: Diagnosis present

## 2015-05-17 DIAGNOSIS — S76212A Strain of adductor muscle, fascia and tendon of left thigh, initial encounter: Secondary | ICD-10-CM | POA: Diagnosis not present

## 2015-05-17 DIAGNOSIS — M25552 Pain in left hip: Secondary | ICD-10-CM

## 2015-05-17 MED ORDER — TIZANIDINE HCL 4 MG PO TABS: 4.0000 mg | ORAL_TABLET | Freq: Three times a day (TID) | ORAL | Status: DC

## 2015-05-17 MED ORDER — CYCLOBENZAPRINE HCL 10 MG PO TABS
5.0000 mg | ORAL_TABLET | Freq: Once | ORAL | Status: AC
  Administered 2015-05-17: 5 mg via ORAL
  Filled 2015-05-17: qty 1

## 2015-05-17 NOTE — ED Notes (Signed)
Patient ambulated with walker. Slow but steady gait. States continued pain with walking. Provider notified.

## 2015-05-17 NOTE — ED Notes (Signed)
Pt presents from home via EMS c/o left groin pain. States felt a pull in left groin yesterday while reaching for something. Now c/o pain in left groin while ambulating. Has hernia per EMS report in adjacent location following surgery 40yrs prior. Seeing wound clinic currently for abd wound. Unable to visualize due to bandage.

## 2015-05-17 NOTE — ED Provider Notes (Signed)
[facility] Emergency Department Provider Note ____________________________________________  Time seen: Approximately 10:39 AM  I have reviewed the triage vital signs and the nursing notes.   HISTORY  Chief Complaint Groin Pain   HPI [name] is a 81 y.o. female who presented to the emergency department for left groin pain that started yesterday. She states that she was trying to get up a step into her daughter's house, reached across her body to grab the railing and felt her left groin "pull." This morning she is having pain with ambulation. She has taken 2 Vicodin this morning without relief.   Past Medical History  Diagnosis Date  . Anxiety   . Chronic diastolic CHF (congestive heart failure)   . Depression   . Diabetes mellitus     Followed by Dr. Tedd Sias at [facility]  . GERD (gastroesophageal reflux disease)   . Hypertension   . Hypothyroidism   . Osteoporosis   . Allergy   . Hyperlipidemia   . RLS (restless legs syndrome)   . Pancreatic disease     pancreatic failure  . Urinary incontinence   . Pancreatitis     Pancreatic resection/ open wound- [facility] 11/04  . Pneumonia     Pneumonia/ Emphysema 05/05  . 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 TIA (transient ischemic attack)     recurrent 12/15  . History of pleural empyema   . Sepsis   . Frequent falls   . Arthritis     left knee, [facility]  . CAD (coronary artery disease), native coronary artery      1//15  T mid-LAD  stent OM2  . Parkinson's disease     Patient Active Problem List   Diagnosis Date Noted  . Right arm pain 05/11/2015  . Diarrhea 04/07/2015  . Insomnia 03/17/2015  . GIB (gastrointestinal bleeding) 03/12/2015  . Chronic fatigue 03/10/2015  . Toe pain, left 03/10/2015  . Paroxysmal atrial fibrillation 03/02/2015  . CKD (chronic kidney disease), stage III 02/12/2015  . Frequent falls 02/12/2015  . Anxiety 02/12/2015   . H/O resection of pancreas 02/12/2015  . Acute on chronic renal failure 02/10/2015  . Dysphagia, pharyngoesophageal phase 01/23/2015  . Memory loss 10/23/2014  . CVA (cerebral infarction) 08/07/2014  . Left rotator cuff tear 08/07/2014  . Coronary artery disease 09/12/2013  . Dyspnea 06/20/2013  . Tremor 06/07/2012  . Osteoarthrosis, unspecified whether generalized or localized, involving lower leg 02/27/2012  . Open abdominal wall wound 04/12/2010  . THROMBOCYTOPENIA 07/30/2009  . Hyperlipidemia 10/27/2008  . ALLERGIC RHINITIS 07/21/2008  . Hypothyroidism 03/07/2007  . Diabetes type 2, controlled 03/07/2007  . ANXIETY 03/07/2007  . DEPRESSION 03/07/2007  . Essential hypertension 03/07/2007  . FAILURE, DIASTOLIC HEART, CHRONIC 03/07/2007  . GERD 03/07/2007    Past Surgical History  Procedure Laterality Date  . Breast lumpectomy      Lumpectomy right breast  . 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 Abdominal hysterectomy      Hysteroscopy/ D & C ([facility]) 12/05  . Removal of pancreas  2004  . Kyphoplasty  2005  . Broken shoulder and orbital bone  2005  . Cholecystectomy  1973  . Thoracentesis    . Vaginal delivery      7  . Cardiac catheterization  08/29/2013    x1 stent @ [facility]  . Cataract surgery  Current Outpatient Rx  Name  Route  Sig  Dispense  Refill  . albuterol (PROVENTIL HFA;VENTOLIN HFA) 108 (90 BASE) MCG/ACT inhaler   Inhalation   Inhale 2 puffs into the lungs every 6 (six) hours as needed for wheezing or shortness of breath.         . ALPRAZolam (XANAX) 1 MG tablet   Oral   Take 0.5-1 mg by mouth 3 (three) times daily as needed for anxiety. Pt takes one-half tablet in the morning, one-half tablet at lunch, and one tablet at bedtime if needed.         . busPIRone (BUSPAR) 10 MG tablet   Oral   Take 10 mg by mouth 2 (two) times daily.         . Carbidopa-Levodopa ER (SINEMET CR) 25-100 MG tablet  controlled release   Oral   Take 2 tablets by mouth 2 (two) times daily.   60 tablet   0   . cetirizine (ZYRTEC) 10 MG tablet   Oral   Take 10 mg by mouth daily.         . clopidogrel (PLAVIX) 75 MG tablet   Oral   Take 1 tablet (75 mg total) by mouth daily.   30 tablet   0   . cyanocobalamin (,VITAMIN B-12,) 1000 MCG/ML injection   Intramuscular   Inject 1 mL (1,000 mcg total) into the muscle every 30 (thirty) days.   30 mL   3   . fluticasone (FLONASE) 50 MCG/ACT nasal spray   Each Nare   Place 2 sprays into both nostrils daily.         . furosemide (LASIX) 40 MG tablet   Oral   Take 1 tablet (40 mg total) by mouth daily.   90 tablet   3   . gentamicin ointment (GARAMYCIN) 0.1 %   Topical   Apply 1 application topically 3 (three) times daily.   30 g   0   . HYDROcodone-acetaminophen (NORCO/VICODIN) 5-325 MG per tablet   Oral   Take 1-2 tablets by mouth 3 (three) times daily as needed for moderate pain.   90 tablet   0   . insulin glargine (LANTUS) 100 UNIT/ML injection   Subcutaneous   Inject 0.6 mLs (60 Units total) into the skin at bedtime.   10 mL   6     Patient prefers NOT to use pens   . insulin lispro (HUMALOG) 100 UNIT/ML injection   Subcutaneous   Inject 0.08-0.18 mLs (8-18 Units total) into the skin 3 (three) times daily before meals.   10 mL   6   . levothyroxine (SYNTHROID, LEVOTHROID) 75 MCG tablet   Oral   Take 75 mcg by mouth daily.         . metoprolol (LOPRESSOR) 100 MG tablet   Oral   Take 1 tablet (100 mg total) by mouth at bedtime.   30 tablet   0   . mirtazapine (REMERON) 15 MG tablet   Oral   Take 1 tablet (15 mg total) by mouth at bedtime.   30 tablet   3   . Multiple Vitamins-Minerals (CENTRUM SILVER PO)   Oral   Take 1 tablet by mouth daily.           Marland Kitchen nystatin (MYCOSTATIN/NYSTOP) 100000 UNIT/GM POWD      Apply under breasts twice daily   60 g   6   . omeprazole (PRILOSEC) 20 MG capsule   Oral  Take 20 mg by mouth daily.         . ondansetron (ZOFRAN) 4 MG tablet   Oral   Take 1 tablet (4 mg total) by mouth every 8 (eight) hours as needed. Patient taking differently: Take 4 mg by mouth every 8 (eight) hours as needed for nausea or vomiting.    90 tablet   11   . Pancrelipase, Lip-Prot-Amyl, (ZENPEP) 15000 UNITS CPEP   Oral   Take 1 capsule by mouth 3 (three) times daily.         . pravastatin (PRAVACHOL) 40 MG tablet   Oral   Take 40 mg by mouth daily.         Marland Kitchen rOPINIRole (REQUIP) 0.5 MG tablet   Oral   Take 1 tablet (0.5 mg total) by mouth at bedtime.   30 tablet   0   . triamcinolone cream (KENALOG) 0.1 %   Topical   Apply topically 2 (two) times daily. Patient taking differently: Apply 1 application topically 2 (two) times daily.    30 g   0     Allergies Citalopram hydrobromide; Diphenhydramine; Metoclopramide; Paroxetine; and Tape  Family History  Problem Relation Age of Onset  . Heart disease Mother   . Heart attack Mother   . Hypertension Mother   . Heart attack Father     Social History Social History  Substance Use Topics  . Smoking status: Never Smoker   . Smokeless tobacco: Never Used  . Alcohol Use: No    Review of Systems Constitutional: No recent illness. Eyes: No visual changes. ENT: No sore throat. Cardiovascular: Denies chest pain or palpitations. Respiratory: Denies shortness of breath. Gastrointestinal: No abdominal pain.  Genitourinary: Negative for dysuria. Musculoskeletal: Pain in left groin Skin: Negative for rash. Neurological: Negative for headaches, focal weakness or numbness. 10-point ROS otherwise negative.  ____________________________________________   PHYSICAL EXAM:  VITAL SIGNS: ED Triage Vitals  Enc Vitals Group     BP 05/17/15 1003 195/88 mmHg     Pulse Rate 05/17/15 1003 65     Resp 05/17/15 1003 16     Temp 05/17/15 1003 98.6 F (37 C)     Temp Source 05/17/15 1003 Oral     SpO2 05/17/15  1003 100 %     Weight 05/17/15 1003 180 lb (81.647 kg)     Height 05/17/15 1003 4\' 11"  (1.499 m)     Head Cir --      Peak Flow --      Pain Score --      Pain Loc --      Pain Edu? --      Excl. in GC? --     Constitutional: Alert and oriented. Well appearing and in no acute distress. Eyes: Conjunctivae are normal. EOMI. Head: Atraumatic. Nose: No congestion/rhinnorhea. Neck: No stridor.  Respiratory: Normal respiratory effort.   Musculoskeletal: Tenderness to palpation in left anterior hip. There is no pain with internal rotation of the hip there is no pain with external rotation of the hip. She is able to bend her knee without assistance. Neurologic:  Normal speech and language. No gross focal neurologic deficits are appreciated. Speech is normal. No gait instability. Skin:  Skin is warm, dry and intact. Atraumatic. Psychiatric: Mood and affect are normal. Speech and behavior are normal.  ____________________________________________   LABS (all labs ordered are listed, but only abnormal results are displayed)  Labs Reviewed - No data to display ____________________________________________  RADIOLOGY  Hip and pelvis negative for acute bony abnormality.  I, Kem Boroughs, personally viewed and evaluated these images (plain radiographs) as part of my medical decision making.   ____________________________________________   PROCEDURES  Procedure(s) performed: None   ____________________________________________   INITIAL IMPRESSION / ASSESSMENT AND PLAN / ED COURSE  Pertinent labs & imaging results that were available during my care of the patient were reviewed by me and considered in my medical decision making (see chart for details).  Flexeril 5 mg was given in the emergency department with some relief of the pain. She was able to use a walker to ambulate in the room and stated that it was still a little painful but much better.  [facility] does not cover Flexeril,  but will cover Zanaflex. She will have a prescription for a few days of the Zanaflex. She was advised to follow-up with orthopedics or her primary care provider for symptoms that are not improving over the week. ____________________________________________   FINAL CLINICAL IMPRESSION(S) / ED DIAGNOSES  Final diagnoses:  Acute hip pain, left       Chinita Pester, FNP 05/17/15 1228  Sharman Cheek, MD 05/17/15 [phone]

## 2015-05-18 ENCOUNTER — Other Ambulatory Visit: Payer: Self-pay | Admitting: Internal Medicine

## 2015-05-18 NOTE — Progress Notes (Signed)
Loop recorder 

## 2015-05-21 ENCOUNTER — Ambulatory Visit: Payer: Medicare Other | Admitting: Cardiovascular Disease

## 2015-05-26 ENCOUNTER — Ambulatory Visit: Payer: Medicare Other | Admitting: Surgery

## 2015-05-26 ENCOUNTER — Other Ambulatory Visit: Payer: Self-pay | Admitting: Internal Medicine

## 2015-05-27 ENCOUNTER — Ambulatory Visit: Payer: Self-pay | Admitting: Internal Medicine

## 2015-06-01 ENCOUNTER — Encounter: Payer: Self-pay | Admitting: Internal Medicine

## 2015-06-01 ENCOUNTER — Ambulatory Visit (INDEPENDENT_AMBULATORY_CARE_PROVIDER_SITE_OTHER): Payer: Medicare Other | Admitting: Internal Medicine

## 2015-06-01 VITALS — BP 173/92 | HR 65 | Temp 97.9°F | Ht 59.0 in | Wt 176.1 lb

## 2015-06-01 DIAGNOSIS — IMO0002 Reserved for concepts with insufficient information to code with codable children: Secondary | ICD-10-CM

## 2015-06-01 DIAGNOSIS — I639 Cerebral infarction, unspecified: Secondary | ICD-10-CM | POA: Diagnosis not present

## 2015-06-01 DIAGNOSIS — M1710 Unilateral primary osteoarthritis, unspecified knee: Secondary | ICD-10-CM

## 2015-06-01 DIAGNOSIS — M179 Osteoarthritis of knee, unspecified: Secondary | ICD-10-CM | POA: Diagnosis not present

## 2015-06-01 DIAGNOSIS — M25511 Pain in right shoulder: Secondary | ICD-10-CM

## 2015-06-01 DIAGNOSIS — N183 Chronic kidney disease, stage 3 unspecified: Secondary | ICD-10-CM

## 2015-06-01 DIAGNOSIS — E1122 Type 2 diabetes mellitus with diabetic chronic kidney disease: Secondary | ICD-10-CM

## 2015-06-01 DIAGNOSIS — Z794 Long term (current) use of insulin: Secondary | ICD-10-CM

## 2015-06-01 MED ORDER — INSULIN GLARGINE 100 UNIT/ML ~~LOC~~ SOLN: 65.0000 [IU] | Freq: Every day | SUBCUTANEOUS | Status: DC

## 2015-06-01 MED ORDER — HYDROCODONE-ACETAMINOPHEN 5-325 MG PO TABS: 1.0000 | ORAL_TABLET | Freq: Three times a day (TID) | ORAL | Status: DC | PRN

## 2015-06-01 MED ORDER — CYANOCOBALAMIN 1000 MCG/ML IJ SOLN: 1000.0000 ug | INTRAMUSCULAR | Status: AC

## 2015-06-01 MED ORDER — INSULIN GLARGINE 100 UNIT/ML ~~LOC~~ SOLN: 70.0000 [IU] | Freq: Every day | SUBCUTANEOUS | Status: DC

## 2015-06-01 MED ORDER — OMEPRAZOLE 20 MG PO CPDR: 20.0000 mg | DELAYED_RELEASE_CAPSULE | Freq: Every day | ORAL | Status: AC

## 2015-06-01 NOTE — Patient Instructions (Addendum)
Increase Lantus to 70units daily.  We will set up home physical therapy.

## 2015-06-01 NOTE — Assessment & Plan Note (Signed)
Chronic OA pain. Symptoms controlled with prn Hydrocodone. Will continue.

## 2015-06-01 NOTE — Progress Notes (Signed)
Subjective:    Patient ID: [name], female    DOB: 1929/04/28, 81 y.o.   MRN: [id number]  HPI  81YO female presents for follow up.  Recently seen in ED for arm and groin pain. Xrays normal. Treated with Zanaflex and continued prn Vicodin. Pain symptoms are improving in groin. Continues to have right shoulder pain. Has history of humeral fracture at this site. Questions rotator cuff tear. Notes weakness of abduction right arm. Symptoms of pain controled with prn Vicodin.  DM - BG running higher, with most BG near 200-300. Compliant with medications.  Wt Readings from Last 3 Encounters:  06/01/15 176 lb 2 oz (79.89 kg)  05/17/15 180 lb (81.647 kg)  05/11/15 177 lb 4 oz (80.4 kg)   BP Readings from Last 3 Encounters:  06/01/15 173/92  05/17/15 195/88  05/11/15 150/71    Past Medical History  Diagnosis Date  . Anxiety   . Chronic diastolic CHF (congestive heart failure) (HCC)   . Depression   . Diabetes mellitus     Followed by Dr. Tedd Sias at [facility]  . GERD (gastroesophageal reflux disease)   . Hypertension   . Hypothyroidism   . Osteoporosis   . Allergy   . Hyperlipidemia   . RLS (restless legs syndrome)   . Pancreatic disease     pancreatic failure  . Urinary incontinence   . Pancreatitis     Pancreatic resection/ open wound- [facility] 11/04  . Pneumonia     Pneumonia/ Emphysema 05/05  . 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 TIA (transient ischemic attack)     recurrent 12/15  . History of pleural empyema   . Sepsis (HCC)   . Frequent falls   . Arthritis     left knee, [facility]  . CAD (coronary artery disease), native coronary artery      1//15  T mid-LAD  stent OM2  . Parkinson's disease (HCC)    Family History  Problem Relation Age of Onset  . Heart disease Mother   . Heart attack Mother   . Hypertension Mother   . Heart attack Father    Past Surgical History  Procedure Laterality Date  . Breast  lumpectomy      Lumpectomy right breast  . 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 Abdominal hysterectomy      Hysteroscopy/ D & C ([facility]) 12/05  . Removal of pancreas  2004  . Kyphoplasty  2005  . Broken shoulder and orbital bone  2005  . Cholecystectomy  1973  . Thoracentesis    . Vaginal delivery      7  . Cardiac catheterization  08/29/2013    x1 stent @ [facility]  . Cataract surgery     Social History   Social History  . Marital Status: Widowed    Spouse Name: N/A  . Number of Children: 7  . Years of Education: 6   Occupational History  . retired- Veterinary surgeon    Social History Main Topics  . Smoking status: Never Smoker   . Smokeless tobacco: Never Used  . Alcohol Use: No  . Drug Use: No  . Sexual Activity: Not Asked   Other Topics Concern  . None   Social History Narrative   Lives in home alone, has nursing aid, and alert device in [place]. Has 7 children.  Has [name], daughter, as health care POA   Not sure about DNR   Permission to speak to daughter-in-law, Shadana Pry, regarding health care matters.    Review of Systems  Constitutional: Negative for fever, chills, appetite change, fatigue and unexpected weight change.  Eyes: Negative for visual disturbance.  Respiratory: Negative for shortness of breath.   Cardiovascular: Negative for chest pain and leg swelling.  Gastrointestinal: Negative for nausea, vomiting, abdominal pain, diarrhea and constipation.  Musculoskeletal: Positive for myalgias and arthralgias.  Skin: Negative for color change and rash.  Neurological: Positive for weakness. Negative for numbness.  Hematological: Negative for adenopathy. Does not bruise/bleed easily.  Psychiatric/Behavioral: Negative for sleep disturbance and dysphoric mood. The patient is not nervous/anxious.        Objective:    BP 173/92 mmHg  Pulse 65  Temp(Src) 97.9 F (36.6 C) (Oral)  Ht 4\' 11"  (1.499  m)  Wt 176 lb 2 oz (79.89 kg)  BMI 35.55 kg/m2  SpO2 94%  LMP  (LMP Unknown) Physical Exam  Constitutional: She is oriented to person, place, and time. She appears well-developed and well-nourished. No distress.  HENT:  Head: Normocephalic and atraumatic.  Right Ear: External ear normal.  Left Ear: External ear normal.  Nose: Nose normal.  Mouth/Throat: Oropharynx is clear and moist. No oropharyngeal exudate.  Eyes: Conjunctivae are normal. Pupils are equal, round, and reactive to light. Right eye exhibits no discharge. Left eye exhibits no discharge. No scleral icterus.  Neck: Normal range of motion. Neck supple. No tracheal deviation present. No thyromegaly present.  Cardiovascular: Normal rate, regular rhythm, normal heart sounds and intact distal pulses.  Exam reveals no gallop and no friction rub.   No murmur heard. Pulmonary/Chest: Effort normal and breath sounds normal. No respiratory distress. She has no wheezes. She has no rales. She exhibits no tenderness.  Musculoskeletal: She exhibits no edema.       Right shoulder: She exhibits decreased range of motion, tenderness, bony tenderness, pain and decreased strength (decreased abduction). She exhibits no swelling.  Lymphadenopathy:    She has no cervical adenopathy.  Neurological: She is alert and oriented to person, place, and time. No cranial nerve deficit. She exhibits normal muscle tone. Coordination normal.  Skin: Skin is warm and dry. No rash noted. She is not diaphoretic. No erythema. No pallor.  Psychiatric: She has a normal mood and affect. Her behavior is normal. Judgment and thought content normal.          Assessment & Plan:   Problem List Items Addressed This Visit      High   Diabetes type 2, controlled (HCC) (Chronic)    BG elevated. Will increase Lantus to 70units daily. Plan recheck A1c in 07/2015. She will call if any BG<70.      Relevant Medications   insulin glargine (LANTUS) 100 UNIT/ML injection       Unprioritized   Osteoarthrosis, unspecified whether generalized or localized, involving lower leg    Chronic OA pain. Symptoms controlled with prn Hydrocodone. Will continue.      Relevant Medications   HYDROcodone-acetaminophen (NORCO/VICODIN) 5-325 MG tablet   Right shoulder pain - Primary    Right shoulder pain. Plain xray showed old humeral fracture and possible rotator cuff tear. Discussed MRI evaluation and PT. Will start PT with goal of increased strength with abduction. If no improvement, consider MRI and ortho referral.      Relevant Orders   Ambulatory referral to Physical Therapy  Return in about 4 weeks (around 06/29/2015) for Recheck.

## 2015-06-01 NOTE — Progress Notes (Signed)
Pre visit review using our clinic review tool, if applicable. No additional management support is needed unless otherwise documented below in the visit note. 

## 2015-06-01 NOTE — Assessment & Plan Note (Signed)
Right shoulder pain. Plain xray showed old humeral fracture and possible rotator cuff tear. Discussed MRI evaluation and PT. Will start PT with goal of increased strength with abduction. If no improvement, consider MRI and ortho referral.

## 2015-06-01 NOTE — Assessment & Plan Note (Signed)
BG elevated. Will increase Lantus to 70units daily. Plan recheck A1c in 07/2015. She will call if any BG<70.

## 2015-06-02 ENCOUNTER — Encounter: Payer: Medicare Other | Attending: Surgery | Admitting: Surgery

## 2015-06-02 DIAGNOSIS — E11622 Type 2 diabetes mellitus with other skin ulcer: Secondary | ICD-10-CM | POA: Insufficient documentation

## 2015-06-02 DIAGNOSIS — E6609 Other obesity due to excess calories: Secondary | ICD-10-CM | POA: Diagnosis not present

## 2015-06-02 DIAGNOSIS — X58XXXS Exposure to other specified factors, sequela: Secondary | ICD-10-CM | POA: Diagnosis not present

## 2015-06-02 DIAGNOSIS — S31102S Unspecified open wound of abdominal wall, epigastric region without penetration into peritoneal cavity, sequela: Secondary | ICD-10-CM | POA: Insufficient documentation

## 2015-06-02 DIAGNOSIS — I10 Essential (primary) hypertension: Secondary | ICD-10-CM | POA: Insufficient documentation

## 2015-06-02 DIAGNOSIS — Z794 Long term (current) use of insulin: Secondary | ICD-10-CM | POA: Insufficient documentation

## 2015-06-03 NOTE — Progress Notes (Signed)
[name] ([id number]) Visit Report for 06/02/2015 Chief Complaint Document Details Patient Name: [name], [name] 06/02/2015 2:00 Date of Service: PM Medical Record [id number] Number: Patient Account Number: [id number] 11-Sep-1928 (81 y.o. 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 Treating RN: Curtis Sites Date of Birth/Sex: Female) Other Clinician: Primary Care Physician: Ronna Polio Treating Evlyn Kanner Referring Physician: Ronna Polio Physician/Extender: Tania Ade in Treatment: 4 Debridement Performed for Wound #8 Medial Abdomen - midline Assessment: Performed By: Physician Evlyn Kanner, MD Debridement: Open Wound/Selective Debridement Selective Description: Pre-procedure Yes Verification/Time Out Taken: Start Time: 14:30 Pain Control: Lidocaine 4% Topical Solution Level: Non-Viable Tissue Total Area Debrided (L x 1.8 (cm) x 8 (cm) = 14.4 (cm) W): Tissue and other Viable, Non-Viable, Eschar, Exudate, Fibrin/Slough material debrided: Instrument: Forceps Bleeding: Minimum Hemostasis Achieved: Pressure End Time: 14:33 Procedural Pain: 0 Post  Procedural Pain: 0 Response to Treatment: Procedure was tolerated well Post Debridement Measurements of Total Wound Length: (cm) 1.8 Width: (cm) 8 Depth: (cm) 0.1 Volume: (cm) 1.131 Post Procedure Diagnosis Same as Pre-procedure Electronic Signature(s) Signed: 06/02/2015 2:53:18 PM By: Evlyn Kanner MD, FACS Signed: 06/02/2015 5:00:37 PM By: Curtis Sites Entered By: Evlyn Kanner on 06/02/2015 14:53:18 LOVEAH, LIKE ([id number], KULAKOWSKI ([id number]) -------------------------------------------------------------------------------- HPI Details Patient Name: [name] 06/02/2015 2:00 Date of Service: PM Medical Record [id number] Number: Patient Account Number: [id number] 02-26-1929 (81 y.o. Treating RN: Curtis Sites Date of Birth/Sex: Female) Other Clinician: Primary Care Physician: Ronna Polio Treating Evlyn Kanner Referring Physician: Ronna Polio Physician/Extender: Weeks in Treatment: 4 History of Present Illness Location: ulceration on the epigastric region Quality: Patient reports No Pain. Severity: Patient states wound (s) are getting better. Duration: Patient has had the wound for < 4 weeks prior to presenting for treatment Context: The wound occurred when the patient started having some profuse bleeding from the wound and was admitted to the hospital and they noticed that the bleeding had stopped. Associated Signs and Symptoms: Wound has minimal bloody drainage HPI Description: Pleasant 81 year old with h/o ex lap for pancreatitis, DM, CHF. returns to clinic for evaluation of her open abdominal wound. She has been applying silver alginate. She is without complaints today. No significant pain. No drainage. No fever or chills. Tolerating a regular diet and having regular bowel movements. 06/02/2015 -- patient has had multiple areas which are now draining fluid and form an eschar. Electronic Signature(s) Signed: 06/02/2015 2:53:47 PM By:  Evlyn Kanner MD, FACS Entered By: Evlyn Kanner on 06/02/2015 14:53:46 SHABREA, WELDIN ([id number]) -------------------------------------------------------------------------------- Physical Exam Details Patient Name: [name] 06/02/2015 2:00 Date of Service: PM Medical Record [id number] Number: Patient Account Number: [id number] 05-23-1929 (81 y.o. Treating RN: Curtis Sites Date of Birth/Sex: Female) Other Clinician: Primary Care Physician: Ronna Polio Treating Evlyn Kanner Referring Physician: Ronna Polio Physician/Extender: Weeks in Treatment: 4 Constitutional . Pulse regular. Respirations normal and unlabored. Afebrile. . Eyes Nonicteric.  Reactive to light. Ears, Nose, Mouth, and Throat Lips, teeth, and gums WNL.Marland Kitchen Moist mucosa without lesions . Neck supple and nontender. No palpable supraclavicular or cervical adenopathy. Normal sized without goiter. Respiratory WNL. No retractions.. Cardiovascular Pedal Pulses WNL. No clubbing, cyanosis or edema. Lymphatic No adneopathy. No adenopathy. No adenopathy. Musculoskeletal Adexa without tenderness or enlargement.. Digits and nails w/o clubbing, cyanosis, infection, petechiae, ischemia, or inflammatory conditions.. Integumentary (Hair, Skin) No suspicious lesions. No crepitus or fluctuance. No peri-wound warmth or erythema. No masses.Marland Kitchen Psychiatric Judgement and insight Intact.. No evidence of depression, anxiety, or agitation.. Notes the central abdomen wound has several areas with eschar and when they're gently removed with a forcep there are open ulcerations under them. Electronic Signature(s) Signed: 06/02/2015 2:54:23 PM By: Evlyn Kanner MD, FACS Entered By: Evlyn Kanner on 06/02/2015 14:54:23 DOMINI, VANDEHEI ([id number]) -------------------------------------------------------------------------------- Physician Orders Details Patient Name: KERSTEN, [name] 06/02/2015 2:00 Date of  Service: PM Medical Record [id number] Number: Patient Account Number: [id number] 1929/08/02 (81 y.o. Treating RN: Curtis Sites Date of Birth/Sex: Female) Other Clinician: Primary Care Physician: Ronna Polio Treating Evlyn Kanner Referring Physician: Ronna Polio Physician/Extender: Tania Ade in Treatment: 4 Verbal / Phone Orders: Yes Clinician: Curtis Sites Read Back and Verified: Yes Diagnosis Coding Wound Cleansing Wound #8 Medial Abdomen - midline o Clean wound with Normal Saline. Anesthetic Wound #8 Medial Abdomen - midline o Topical Lidocaine 4% cream applied to wound bed prior to debridement Skin Barriers/Peri-Wound Care Wound #8 Medial Abdomen - midline o Skin Prep Primary Wound Dressing Wound #8 Medial Abdomen - midline o Aquacel Ag Secondary Dressing Wound #8 Medial Abdomen - midline o Boardered Foam Dressing Dressing Change Frequency Wound #8 Medial Abdomen - midline o Change Dressing Monday, Wednesday, Friday Follow-up Appointments Wound #8 Medial Abdomen - midline o Return Appointment in 2 weeks. - or as she is able Home Health Wound #8 Medial Abdomen - midline o Continue Home Health Visits - [facility] o Home Health Nurse may visit PRN to address patientos wound care needs. ASHLYND, MICHNA ([id number]) o FACE TO FACE ENCOUNTER: MEDICARE and MEDICAID PATIENTS: I certify that this patient is under my care and that I had a face-to-face encounter that meets the physician face-to-face encounter requirements with this patient on this date. The encounter with the patient was in whole or in part for the following MEDICAL CONDITION: (primary reason for Home Healthcare) MEDICAL NECESSITY: I certify, that based on my findings, NURSING services are a medically necessary home health service. HOME BOUND STATUS: I certify that my clinical findings support that this patient is homebound (i.e., Due to illness or injury, pt requires aid  of supportive devices such as crutches, cane, wheelchairs, walkers, the use of special transportation or the assistance of another person to leave their place of residence. There is a normal inability to leave the home and doing so requires considerable and taxing effort. Other absences are for medical reasons / religious services and are infrequent or of short duration when for other reasons). o If current dressing causes regression in wound condition, may D/C ordered dressing product/s and apply Normal Saline Moist Dressing daily until next Wound Healing Center / Other MD appointment. Notify Wound Healing Center of regression in wound condition at [phone]. o Please direct any NON-WOUND related issues/requests for orders to patient's Primary Care Physician Electronic Signature(s) Signed: 06/02/2015 4:43:21 PM By: Evlyn Kanner MD, FACS Signed: 06/02/2015 5:00:37 PM By: Curtis Sites Entered By: Curtis Sites on 06/02/2015 14:35:06 Slaubaugh, [name] ([id number]) --------------------------------------------------------------------------------  Problem List Details Patient Name: JULIANI, LADUKE 06/02/2015 2:00 Date of Service: PM Medical Record [id number] Number: Patient Account Number: [id number] 27-Mar-1929 (81 y.o. Treating RN: Curtis Sites Date of Birth/Sex: Female) Other Clinician: Primary Care Physician: Ronna Polio Treating Evlyn Kanner Referring Physician: Ronna Polio Physician/Extender: Tania Ade in Treatment: 4 Active Problems ICD-10 Encounter Code Description Active Date Diagnosis E11.622 Type 2 diabetes mellitus with other skin ulcer 05/05/2015 Yes S31.102S Unspecified open wound of abdominal wall, epigastric 05/05/2015 Yes region without penetration into peritoneal cavity, sequela E66.09 Other obesity due to excess calories 05/05/2015 Yes Inactive Problems Resolved Problems Electronic Signature(s) Signed: 06/02/2015 2:52:56 PM By: Evlyn Kanner MD,  FACS Entered By: Evlyn Kanner on 06/02/2015 14:52:56 [name] ([id number]) -------------------------------------------------------------------------------- Progress Note Details Patient Name: [name] 06/02/2015 2:00 Date of Service: PM Medical Record [id number] Number: Patient Account Number: [id number] 1929/01/05 (81 y.o. 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 Severity: Patient states wound (s) are getting better. Duration: Patient has had the wound for < 4 weeks prior to presenting for treatment Context: The wound occurred when the patient started having some profuse bleeding from the wound and was admitted to the hospital and they noticed that the bleeding had stopped. Associated Signs and Symptoms: Wound has minimal bloody drainage Pleasant 81 year old with h/o ex lap for pancreatitis, DM, CHF. returns to clinic for evaluation of her open abdominal wound. She has been applying silver alginate. She is without complaints today. No significant pain. No drainage. No fever or chills. Tolerating a regular diet and having regular bowel movements. 06/02/2015 -- patient has had multiple areas which are now draining fluid and form an eschar. Objective Constitutional Pulse regular. Respirations normal and unlabored. Afebrile. Vitals Time Taken: 2:18 PM, Height: 59 in, Weight: 180 lbs,  BMI: 36.4, Temperature: 98.2 F, Pulse: 67 bpm, Respiratory Rate: 16 breaths/min, Blood Pressure: 152/75 mmHg. LANDRI, DORSAINVIL ([id number]) Eyes Nonicteric. Reactive to light. Ears, Nose, Mouth, and Throat Lips, teeth, and gums WNL.Marland Kitchen Moist mucosa without lesions . Neck supple and nontender. No palpable supraclavicular or cervical adenopathy. Normal sized without goiter. Respiratory WNL. No retractions.. Cardiovascular Pedal Pulses WNL. No clubbing, cyanosis or edema. Lymphatic No adneopathy. No adenopathy. No adenopathy. Musculoskeletal Adexa without tenderness or enlargement.. Digits and nails w/o clubbing, cyanosis, infection, petechiae, ischemia, or inflammatory conditions.Marland Kitchen Psychiatric Judgement and insight Intact.. No evidence of depression, anxiety, or agitation.. General Notes: the central abdomen wound has several areas with eschar and when they're gently removed with a forcep there are open ulcerations under them. Integumentary (Hair, Skin) No suspicious lesions. No crepitus or fluctuance. No peri-wound warmth or erythema. No masses.. Wound #8 status is Open. Original cause of wound was Not Known. The wound is located on the Medial Abdomen - midline. The wound measures 1.8cm length x 8cm width x 0.1cm depth; 11.31cm^2 area and 1.131cm^3 volume. The wound is limited to skin breakdown. There is no tunneling or undermining noted. There is a small amount of purulent drainage noted. The wound margin is  flat and intact. There is no granulation within the wound bed. There is a large (67-100%) amount of necrotic tissue within the wound bed including Eschar and Adherent Slough. The periwound skin appearance did not exhibit: Callus, Crepitus, Excoriation, Fluctuance, Friable, Induration, Localized Edema, Rash, Scarring, Dry/Scaly, Maceration, Moist, Atrophie Blanche, Cyanosis, Ecchymosis, Hemosiderin Staining, Mottled, Pallor, Rubor, Erythema. Periwound temperature was noted as No  Abnormality. Assessment Active Problems BONNELL, PLACZEK ([id number]) ICD-10 E11.622 - Type 2 diabetes mellitus with other skin ulcer S31.102S - Unspecified open wound of abdominal wall, epigastric region without penetration into peritoneal cavity, sequela E66.09 - Other obesity due to excess calories Procedures Wound #8 Wound #8 is an Atypical located on the Medial Abdomen - midline . There was a Non-Viable Tissue Open Wound/Selective [phone]) debridement with total area of 14.4 sq cm performed by Evlyn Kanner, MD. with the following instrument(s): Forceps to remove Viable and Non-Viable tissue/material including Exudate, Fibrin/Slough, and Eschar after achieving pain control using Lidocaine 4% Topical Solution. A time out was conducted prior to the start of the procedure. A Minimum amount of bleeding was controlled with Pressure. The procedure was tolerated well with a pain level of 0 throughout and a pain level of 0 following the procedure. Post Debridement Measurements: 1.8cm length x 8cm width x 0.1cm depth; 1.131cm^3 volume. Post procedure Diagnosis Wound #8: Same as Pre-Procedure Plan Wound Cleansing: Wound #8 Medial Abdomen - midline: Clean wound with Normal Saline. Anesthetic: Wound #8 Medial Abdomen - midline: Topical Lidocaine 4% cream applied to wound bed prior to debridement Skin Barriers/Peri-Wound Care: Wound #8 Medial Abdomen - midline: Skin Prep Primary Wound Dressing: Wound #8 Medial Abdomen - midline: Aquacel Ag Secondary Dressing: Wound #8 Medial Abdomen - midline: Boardered Foam Dressing Dressing Change Frequency: Wound #8 Medial Abdomen - midline: Change Dressing Monday, Wednesday, Friday Follow-up Appointments: ALIYA, SOL ([id number]) Wound #8 Medial Abdomen - midline: Return Appointment in 2 weeks. - or as she is able Home Health: Wound #8 Medial Abdomen - midline: Continue Home Health Visits - [facility] Nurse may visit PRN to  address patient s wound care needs. FACE TO FACE ENCOUNTER: MEDICARE and MEDICAID PATIENTS: I certify that this patient is under my care and that I had a face-to-face encounter that meets the physician face-to-face encounter requirements with this patient on this date. The encounter with the patient was in whole or in part for the following MEDICAL CONDITION: (primary reason for Home Healthcare) MEDICAL NECESSITY: I certify, that based on my findings, NURSING services are a medically necessary home health service. HOME BOUND STATUS: I certify that my clinical findings support that this patient is homebound (i.e., Due to illness or injury, pt requires aid of supportive devices such as crutches, cane, wheelchairs, walkers, the use of special transportation or the assistance of another person to leave their place of residence. There is a normal inability to leave the home and doing so requires considerable and taxing effort. Other absences are for medical reasons / religious services and are infrequent or of short duration when for other reasons). If current dressing causes regression in wound condition, may D/C ordered dressing product/s and apply Normal Saline Moist Dressing daily until next Wound Healing Center / Other MD appointment. Notify Wound Healing Center of regression in wound condition at [phone]. Please direct any NON-WOUND related issues/requests for orders to patient's Primary Care Physician I have recommended we continue silver alginate to be changed every other day and supportive care over this abdominal wound.  She will come back and see me in follow-up. Electronic Signature(s) Signed: 06/02/2015 2:54:52 PM By: Evlyn KannerBritto, Olanna Percifield MD, FACS Entered By: Evlyn KannerBritto, Aina Rossbach on 06/02/2015 14:54:52 Hudnall, Lucile ShuttersLARA O. ([id number]) -------------------------------------------------------------------------------- SuperBill Details Patient Name: [name], Toula O. Date of Service: 06/02/2015 Medical  Record Number: [id number] Patient Account Number: [id number] Date of Birth/Sex: 12/27/1928 (81 y.o. Female) Treating RN: Curtis Sitesorthy, Joanna Primary Care Physician: Ronna PolioWalker, [name] Other Clinician: Referring Physician: Ronna PolioWalker, [name] Treating Physician/Extender: Rudene ReBritto, Mateo Overbeck Weeks in Treatment: 4 Diagnosis Coding ICD-10 Codes Code Description [phone] Type 2 diabetes mellitus with other skin ulcer Unspecified open wound of abdominal wall, epigastric region without penetration into S31.102S peritoneal cavity, sequela E66.09 Other obesity due to excess calories Facility Procedures CPT4: Description Modifier Quantity Code [id number] - DEBRIDE WOUND 1ST 20 SQ CM OR < 1 ICD-10 Description Diagnosis E11.622 Type 2 diabetes mellitus with other skin ulcer S31.102S Unspecified open wound of abdominal wall, epigastric region without  penetration into peritoneal cavity, sequela E66.09 Other obesity due to excess calories Physician Procedures CPT4: Description Modifier Quantity Code [id number] - WC PHYS DEBR WO ANESTH 20 SQ CM 1 ICD-10 Description Diagnosis E11.622 Type 2 diabetes mellitus with other skin ulcer S31.102S Unspecified open wound of abdominal wall, epigastric region without  penetration into peritoneal cavity, sequela E66.09 Other obesity due to excess calories Electronic Signature(s) Signed: 06/02/2015 2:55:51 PM By: Evlyn KannerBritto, Bryceton Hantz MD, FACS Entered By: Evlyn KannerBritto, Ayen Viviano on 06/02/2015 14:55:51

## 2015-06-03 NOTE — Progress Notes (Signed)
[name], [name] ([id number]) Visit Report for 06/02/2015 Arrival Information Details Patient Name: [name], [name]. Date of Service: 06/02/2015 2:00 PM Medical Record Number: [id number] Patient Account Number: [id number] Date of Birth/Sex: February 14, 1929 (81 y.o. Female) Treating RN: Curtis Sites Primary Care Physician: Ronna Polio Other Clinician: Referring Physician: Ronna Polio Treating Physician/Extender: Rudene Re in Treatment: 4 Visit Information History Since Last Visit Added or deleted any medications: No Patient Arrived: Walker Any new allergies or adverse reactions: No Arrival Time: 14:16 Had a fall or experienced change in No Accompanied By: self activities of daily living that may affect Transfer Assistance: None risk of falls: Patient Identification Verified: Yes Signs or symptoms of abuse/neglect since last No Secondary Verification Process Yes visito Completed: Hospitalized since last visit: No Patient Has Alerts: Yes Pain Present Now: No Patient Alerts: Patient on Blood Thinner Electronic Signature(s) Signed: 06/02/2015 5:00:37 PM By: Curtis Sites Entered By: Curtis Sites on 06/02/2015 14:17:02 [name] ([id number]) -------------------------------------------------------------------------------- Encounter Discharge Information Details Patient Name: [name] Date of Service: 06/02/2015 2:00 PM Medical Record Number: [id number] Patient Account Number: [id number] Date of Birth/Sex: 31-Aug-1928 (81 y.o. Female) Treating RN: Curtis Sites Primary Care Physician: Ronna Polio Other Clinician: Referring Physician: Ronna Polio Treating Physician/Extender: Rudene Re in Treatment: 4 Encounter Discharge Information Items Discharge Pain Level: 0 Discharge Condition: Stable Ambulatory Status: Walker Discharge Destination: Home Transportation: Private Auto Accompanied By: self Schedule Follow-up Appointment:  Yes Medication Reconciliation completed and provided to Patient/Care No [name]: Provided on Clinical Summary of Care: 06/02/2015 Form Type Recipient Paper Patient CL Electronic Signature(s) Signed: 06/02/2015 2:41:48 PM By: Gwenlyn Perking Entered By: Gwenlyn Perking on 06/02/2015 14:41:48 [name] ([id number]) -------------------------------------------------------------------------------- Multi Wound Chart Details Patient Name: [name] Date of Service: 06/02/2015 2:00 PM Medical Record Number: [id number] Patient Account Number: [id number] Date of Birth/Sex: 10/20/1928 (81 y.o. Female) Treating RN: Curtis Sites Primary Care Physician: Ronna Polio Other Clinician: Referring Physician: Ronna Polio Treating Physician/Extender: Rudene Re in Treatment: 4 Vital Signs Height(in): 59 Pulse(bpm): 67 Weight(lbs): 180 Blood Pressure 152/75 (mmHg): Body Mass Index(BMI): 36 Temperature(F): 98.2 Respiratory Rate 16 (breaths/min): Photos: [8:No Photos] [N/A:N/A] Wound Location: [8:Abdomen - midline - Medial] [N/A:N/A] Wounding Event: [8:Not Known] [N/A:N/A] Primary Etiology: [8:Atypical] [N/A:N/A] Comorbid History: [8:Cataracts, Hypertension, Type II Diabetes] [N/A:N/A] Date Acquired: [8:04/23/2003] [N/A:N/A] Weeks of Treatment: [8:4] [N/A:N/A] Wound Status: [8:Open] [N/A:N/A] Clustered Wound: [8:Yes] [N/A:N/A] Measurements L x W x D 1.8x8x0.1 [N/A:N/A] (cm) Area (cm) : [8:11.31] [N/A:N/A] Volume (cm) : [8:1.131] [N/A:N/A] % Reduction in Area: [8:-2900.00%] [N/A:N/A] % Reduction in Volume: -1408.00% [N/A:N/A] Classification: [8:Full Thickness Without Exposed Support Structures] [N/A:N/A] Exudate Amount: [8:Small] [N/A:N/A] Exudate Type: [8:Purulent] [N/A:N/A] Exudate Color: [8:yellow, brown, green] [N/A:N/A] Wound Margin: [8:Flat and Intact] [N/A:N/A] Granulation Amount: [8:None Present (0%)] [N/A:N/A] Necrotic Amount: [8:Large (67-100%)]  [N/A:N/A] Necrotic Tissue: [8:Eschar, Adherent Slough] [N/A:N/A] Exposed Structures: [8:Fascia: No Fat: No Tendon: No] [N/A:N/A] Muscle: No Joint: No Bone: No Limited to Skin Breakdown Epithelialization: None N/A N/A Periwound Skin Texture: Edema: No N/A N/A Excoriation: No Induration: No Callus: No Crepitus: No Fluctuance: No Friable: No Rash: No Scarring: No Periwound Skin Maceration: No N/A N/A Moisture: Moist: No Dry/Scaly: No Periwound Skin Color: Atrophie Blanche: No N/A N/A Cyanosis: No Ecchymosis: No Erythema: No Hemosiderin Staining: No Mottled: No Pallor: No Rubor: No Temperature: No Abnormality N/A N/A Tenderness on No N/A N/A Palpation: Wound Preparation: Ulcer Cleansing: N/A N/A Rinsed/Irrigated with Saline Topical Anesthetic Applied: Other: lidocaine 4% Treatment Notes Electronic Signature(s) Signed:  06/02/2015 5:00:37 PM By: Curtis Sitesorthy, Joanna Entered By: Curtis Sitesorthy, Joanna on 06/02/2015 14:31:36 Lauver, [name] ([id number]) -------------------------------------------------------------------------------- Multi-Disciplinary Care Plan Details Patient Name: [name] SpringLLOYD, [name] Date of Service: 06/02/2015 2:00 PM Medical Record Number: [id number] Patient Account Number: [id number] Date of Birth/Sex: 08/18/1929 (81 y.o. Female) Treating RN: Curtis Sitesorthy, Joanna Primary Care Physician: Ronna PolioWalker, [name] Other Clinician: Referring Physician: Ronna PolioWalker, [name] Treating Physician/Extender: Rudene ReBritto, Errol Weeks in Treatment: 4 Active Inactive Abuse / Safety / Falls / Self Care Management Nursing Diagnoses: Impaired physical mobility Potential for falls Goals: Patient will remain injury free Date Initiated: 05/05/2015 Goal Status: Active Interventions: Assess fall risk on admission and as needed Notes: Wound/Skin Impairment Nursing Diagnoses: Impaired tissue integrity Goals: Ulcer/skin breakdown will have a volume reduction of 30% by week 4 Date Initiated:  05/05/2015 Goal Status: Active Ulcer/skin breakdown will have a volume reduction of 50% by week 8 Date Initiated: 05/05/2015 Goal Status: Active Ulcer/skin breakdown will have a volume reduction of 80% by week 12 Date Initiated: 05/05/2015 Goal Status: Active Ulcer/skin breakdown will heal within 14 weeks Date Initiated: 05/05/2015 Goal Status: Active Interventions: Assess ulceration(s) every visit [name] SpringLLOYD, [name] ([id number]) Provide education on ulcer and skin care Notes: Electronic Signature(s) Signed: 06/02/2015 5:00:37 PM By: Curtis Sitesorthy, Joanna Entered By: Curtis Sitesorthy, Joanna on 06/02/2015 14:24:35 Schmelzer, [name] ([id number]) -------------------------------------------------------------------------------- Patient/Caregiver Education Details Patient Name: [name] SpringLLOYD, [name] Date of Service: 06/02/2015 2:00 PM Medical Record Number: [id number] Patient Account Number: [id number] Date of Birth/Gender: 02/25/1929 (81 y.o. Female) Treating RN: Curtis Sitesorthy, Joanna Primary Care Physician: Ronna PolioWalker, [name] Other Clinician: Referring Physician: Ronna PolioWalker, [name] Treating Physician/Extender: Rudene ReBritto, Errol Weeks in Treatment: 4 Education Assessment Education Provided To: Patient Education Topics Provided Wound/Skin Impairment: Handouts: Other: wound care as ordered Methods: Demonstration, Explain/Verbal Responses: State content correctly Electronic Signature(s) Signed: 06/02/2015 5:00:37 PM By: Curtis Sitesorthy, Joanna Entered By: Curtis Sitesorthy, Joanna on 06/02/2015 14:46:01 Rendleman, [name] ([id number]) -------------------------------------------------------------------------------- Wound Assessment Details Patient Name: [name] SpringLLOYD, [name] Date of Service: 06/02/2015 2:00 PM Medical Record Number: [id number] Patient Account Number: [id number] Date of Birth/Sex: 08/04/1929 (81 y.o. Female) Treating RN: Curtis Sitesorthy, Joanna Primary Care Physician: Ronna PolioWalker, [name] Other Clinician: Referring Physician: Ronna PolioWalker,  [name] Treating Physician/Extender: Rudene ReBritto, Errol Weeks in Treatment: 4 Wound Status Wound Number: 8 Primary Atypical Etiology: Wound Location: Abdomen - midline - Medial Wound Status: Open Wounding Event: Not Known Comorbid Cataracts, Hypertension, Type II Date Acquired: 04/23/2003 History: Diabetes Weeks Of Treatment: 4 Clustered Wound: Yes Photos Photo Uploaded By: Curtis Sitesorthy, Joanna on 06/02/2015 16:58:52 Wound Measurements Length: (cm) 1.8 Width: (cm) 8 Depth: (cm) 0.1 Area: (cm) 11.31 Volume: (cm) 1.131 % Reduction in Area: -2900% % Reduction in Volume: -1408% Epithelialization: None Tunneling: No Undermining: No Wound Description Full Thickness Without Exposed Classification: Support Structures Wound Margin: Flat and Intact Exudate Small Amount: Exudate Type: Purulent Exudate Color: yellow, brown, green Foul Odor After Cleansing: No Wound Bed Granulation Amount: None Present (0%) Exposed Structure Necrotic Amount: Large (67-100%) Fascia Exposed: No Necrotic Quality: Eschar, Adherent Slough Fat Layer Exposed: No Vultaggio, Gracey O. ([id number]) Tendon Exposed: No Muscle Exposed: No Joint Exposed: No Bone Exposed: No Limited to Skin Breakdown Periwound Skin Texture Texture Color No Abnormalities Noted: No No Abnormalities Noted: No Callus: No Atrophie Blanche: No Crepitus: No Cyanosis: No Excoriation: No Ecchymosis: No Fluctuance: No Erythema: No Friable: No Hemosiderin Staining: No Induration: No Mottled: No Localized Edema: No Pallor: No Rash: No Rubor: No Scarring: No Temperature / Pain Moisture Temperature: No Abnormality No Abnormalities Noted: No Dry / Scaly: No Maceration: No Moist:  No Wound Preparation Ulcer Cleansing: Rinsed/Irrigated with Saline Topical Anesthetic Applied: Other: lidocaine 4%, Treatment Notes Wound #8 (Medial Abdomen - midline) 1. Cleansed with: Clean wound with Normal Saline 2. Anesthetic Topical  Lidocaine 4% cream to wound bed prior to debridement 4. Dressing Applied: Aquacel Ag 5. Secondary Dressing Applied Bordered Foam Dressing Electronic Signature(s) Signed: 06/02/2015 5:00:37 PM By: Curtis Sites Entered By: Curtis Sites on 06/02/2015 14:24:08 [name] ([id number]) -------------------------------------------------------------------------------- Vitals Details Patient Name: [name] Date of Service: 06/02/2015 2:00 PM Medical Record Number: [id number] Patient Account Number: [id number] Date of Birth/Sex: 1928-09-05 (81 y.o. Female) Treating RN: Curtis Sites Primary Care Physician: Ronna Polio Other Clinician: Referring Physician: Ronna Polio Treating Physician/Extender: Rudene Re in Treatment: 4 Vital Signs Time Taken: 14:18 Temperature (F): 98.2 Height (in): 59 Pulse (bpm): 67 Weight (lbs): 180 Respiratory Rate (breaths/min): 16 Body Mass Index (BMI): 36.4 Blood Pressure (mmHg): 152/75 Reference Range: 80 - 120 mg / dl Electronic Signature(s) Signed: 06/02/2015 5:00:37 PM By: Curtis Sites Entered By: Curtis Sites on 06/02/2015 14:19:31

## 2015-06-04 ENCOUNTER — Other Ambulatory Visit: Payer: Self-pay | Admitting: Internal Medicine

## 2015-06-04 NOTE — Telephone Encounter (Signed)
Last OV 10.10.16, next OV 11.8.16. Please advise refill

## 2015-06-05 NOTE — Telephone Encounter (Signed)
Rx faxed to pharmacy on 10.14.16 by Raynelle FanningJulie

## 2015-06-08 ENCOUNTER — Ambulatory Visit: Payer: Self-pay | Admitting: Internal Medicine

## 2015-06-09 NOTE — Progress Notes (Signed)
[facility] summary report received. Battery status OK. Normal device function. No new symptom episodes, tachy episodes, brady, or pause episodes. 1 AF episode---previously printed for review + plavix. Monthly summary reports and ROV w/ SK PRN.

## 2015-06-12 ENCOUNTER — Encounter: Payer: Self-pay | Admitting: Internal Medicine

## 2015-06-15 ENCOUNTER — Ambulatory Visit (INDEPENDENT_AMBULATORY_CARE_PROVIDER_SITE_OTHER): Payer: Medicare Other | Admitting: *Deleted

## 2015-06-15 DIAGNOSIS — I638 Other cerebral infarction: Secondary | ICD-10-CM | POA: Diagnosis not present

## 2015-06-15 DIAGNOSIS — I6389 Other cerebral infarction: Secondary | ICD-10-CM

## 2015-06-15 LAB — CUP PACEART REMOTE DEVICE CHECK: Date Time Interrogation Session: 20161022163525

## 2015-06-15 NOTE — Progress Notes (Signed)
Loop recorder 

## 2015-06-19 ENCOUNTER — Telehealth: Payer: Self-pay

## 2015-06-19 MED ORDER — INSULIN GLARGINE 100 UNIT/ML SOLOSTAR PEN: 70.0000 [IU] | PEN_INJECTOR | Freq: Every day | SUBCUTANEOUS | Status: DC

## 2015-06-19 MED ORDER — INSULIN LISPRO 100 UNIT/ML (KWIKPEN): 8.0000 [IU] | PEN_INJECTOR | Freq: Three times a day (TID) | SUBCUTANEOUS | Status: DC

## 2015-06-19 NOTE — Telephone Encounter (Signed)
Also needs lantus 75 pens.  thanks

## 2015-06-19 NOTE — Addendum Note (Signed)
Addended by: Ronna PolioWALKER, Miaa Latterell A on: 06/19/2015 03:32 PM   Modules accepted: Orders

## 2015-06-19 NOTE — Telephone Encounter (Signed)
Patient is having issues drawing up humalog insulin per the [facility].  Can you/ will you send a prescription to [facility] for humalog pens? thanks

## 2015-06-23 ENCOUNTER — Telehealth: Payer: Self-pay | Admitting: *Deleted

## 2015-06-23 NOTE — Telephone Encounter (Signed)
[facility], has requested orders for a recertification for patient to receive home health.

## 2015-06-23 NOTE — Telephone Encounter (Signed)
[name] FanningJulie please advise if you have that form?

## 2015-06-24 ENCOUNTER — Telehealth: Payer: Self-pay | Admitting: *Deleted

## 2015-06-24 NOTE — Telephone Encounter (Signed)
For your approval 

## 2015-06-24 NOTE — Telephone Encounter (Signed)
Fine to approve 

## 2015-06-24 NOTE — Telephone Encounter (Signed)
[facility] has requested authorization for wound care. 3x a week for 2 weeks, from there; 2x a week for 7 weeks.

## 2015-06-30 ENCOUNTER — Ambulatory Visit (INDEPENDENT_AMBULATORY_CARE_PROVIDER_SITE_OTHER): Payer: Medicare Other | Admitting: Internal Medicine

## 2015-06-30 ENCOUNTER — Encounter: Payer: Medicare Other | Attending: Surgery | Admitting: Surgery

## 2015-06-30 ENCOUNTER — Encounter: Payer: Self-pay | Admitting: Internal Medicine

## 2015-06-30 VITALS — BP 174/92 | HR 72 | Temp 98.0°F | Wt 178.2 lb

## 2015-06-30 DIAGNOSIS — Z794 Long term (current) use of insulin: Secondary | ICD-10-CM

## 2015-06-30 DIAGNOSIS — I509 Heart failure, unspecified: Secondary | ICD-10-CM | POA: Diagnosis not present

## 2015-06-30 DIAGNOSIS — N183 Chronic kidney disease, stage 3 unspecified: Secondary | ICD-10-CM

## 2015-06-30 DIAGNOSIS — S31102S Unspecified open wound of abdominal wall, epigastric region without penetration into peritoneal cavity, sequela: Secondary | ICD-10-CM | POA: Diagnosis not present

## 2015-06-30 DIAGNOSIS — E538 Deficiency of other specified B group vitamins: Secondary | ICD-10-CM

## 2015-06-30 DIAGNOSIS — E1122 Type 2 diabetes mellitus with diabetic chronic kidney disease: Secondary | ICD-10-CM | POA: Diagnosis not present

## 2015-06-30 DIAGNOSIS — E11622 Type 2 diabetes mellitus with other skin ulcer: Secondary | ICD-10-CM | POA: Insufficient documentation

## 2015-06-30 DIAGNOSIS — E6609 Other obesity due to excess calories: Secondary | ICD-10-CM | POA: Diagnosis not present

## 2015-06-30 DIAGNOSIS — X58XXXS Exposure to other specified factors, sequela: Secondary | ICD-10-CM | POA: Insufficient documentation

## 2015-06-30 DIAGNOSIS — M1710 Unilateral primary osteoarthritis, unspecified knee: Secondary | ICD-10-CM

## 2015-06-30 DIAGNOSIS — M25511 Pain in right shoulder: Secondary | ICD-10-CM

## 2015-06-30 DIAGNOSIS — I10 Essential (primary) hypertension: Secondary | ICD-10-CM | POA: Diagnosis not present

## 2015-06-30 DIAGNOSIS — M179 Osteoarthritis of knee, unspecified: Secondary | ICD-10-CM

## 2015-06-30 DIAGNOSIS — I639 Cerebral infarction, unspecified: Secondary | ICD-10-CM

## 2015-06-30 DIAGNOSIS — IMO0002 Reserved for concepts with insufficient information to code with codable children: Secondary | ICD-10-CM

## 2015-06-30 MED ORDER — AMLODIPINE BESYLATE 2.5 MG PO TABS: 2.5000 mg | ORAL_TABLET | Freq: Every day | ORAL | Status: DC

## 2015-06-30 MED ORDER — CYANOCOBALAMIN 1000 MCG/ML IJ SOLN
1000.0000 ug | Freq: Once | INTRAMUSCULAR | Status: AC
  Administered 2015-06-30: 1000 ug via INTRAMUSCULAR

## 2015-06-30 MED ORDER — HYDROCODONE-ACETAMINOPHEN 5-325 MG PO TABS: 1.0000 | ORAL_TABLET | Freq: Three times a day (TID) | ORAL | Status: DC | PRN

## 2015-06-30 NOTE — Progress Notes (Signed)
[name], [name] ([id number]) Visit Report for 06/30/2015 Arrival Information Details Patient Name: [name]. Date of Service: 06/30/2015 1:00 PM Medical Record Number: [id number] Patient Account Number: [id number] Date of Birth/Sex: 10/31/1928 (81 y.o. Female) Treating [name]: [name] Primary Care Physician: Ronna Polio Other Clinician: Referring Physician: Ronna Polio Treating Physician/Extender: Rudene Re in Treatment: 8 Visit Information History Since Last Visit Added or deleted any medications: No Patient Arrived: [name] Any new allergies or adverse reactions: No Arrival Time: 12:57 Had a fall or experienced change in No Accompanied By: son in lobby activities of daily living that may affect Transfer Assistance: None risk of falls: Patient Identification Verified: Yes Signs or symptoms of abuse/neglect since last No Secondary Verification Process Yes visito Completed: Has Dressing in Place as Prescribed: Yes Patient Has Alerts: Yes Pain Present Now: No Patient Alerts: Patient on Blood Thinner Electronic Signature(s) Signed: 06/30/2015 4:10:23 PM By: [name] Gurney, [name], BSN, [name], BSN Entered By: [name] Gurney, [name], BSN, [name] on 06/30/2015 12:57:39 [name] ([id number]) -------------------------------------------------------------------------------- Encounter Discharge Information Details Patient Name: [name] Date of Service: 06/30/2015 1:00 PM Medical Record Number: [id number] Patient Account Number: [id number] Date of Birth/Sex: 1929-05-26 (81 y.o. Female) Treating [name]: [name] Primary Care Physician: Ronna Polio Other Clinician: Referring Physician: Ronna Polio Treating Physician/Extender: Rudene Re in Treatment: 8 Encounter Discharge Information Items Discharge Pain Level: 0 Discharge Condition: Stable Ambulatory Status: Walker Discharge Destination: Home Transportation: Private Auto Accompanied By: son in  lobby Schedule Follow-up Appointment: Yes Medication Reconciliation completed Yes and provided to Patient/Care [name]: Provided on Clinical Summary of Care: 06/30/2015 Form Type Recipient Paper Patient CL Electronic Signature(s) Signed: 06/30/2015 4:10:23 PM By: [name] Gurney [name], BSN, [name], BSN Previous Signature: 06/30/2015 1:19:58 PM Version By: Gwenlyn Perking Entered By: [name] Gurney [name], BSN, [name] on 06/30/2015 13:25:53 [name], [name] ([id number]) -------------------------------------------------------------------------------- Multi Wound Chart Details Patient Name: [name] Date of Service: 06/30/2015 1:00 PM Medical Record Number: [id number] Patient Account Number: [id number] Date of Birth/Sex: [birth year] (81 y.o. Female) Treating [name]: [name] Primary Care Physician: Ronna Polio Other Clinician: Referring Physician: Ronna Polio Treating Physician/Extender: Rudene Re in Treatment: 8 Vital Signs Height(in): 59 Pulse(bpm): 74 Weight(lbs): 180 Blood Pressure 200/89 (mmHg): Body Mass Index(BMI): 36 Temperature(F): 97.7 Respiratory Rate 16 (breaths/min): Photos: [8:No Photos] [N/A:N/A] Wound Location: [8:Abdomen - midline - Medial] [N/A:N/A] Wounding Event: [8:Not Known] [N/A:N/A] Primary Etiology: [8:Atypical] [N/A:N/A] Comorbid History: [8:Cataracts, Hypertension, Type II Diabetes] [N/A:N/A] Date Acquired: [8:04/23/2003] [N/A:N/A] Weeks of Treatment: [8:8] [N/A:N/A] Wound Status: [8:Open] [N/A:N/A] Clustered Wound: [8:Yes] [N/A:N/A] Measurements L x W x D 2x1.5x0.3 [N/A:N/A] (cm) Area (cm) : [8:2.356] [N/A:N/A] Volume (cm) : [8:0.707] [N/A:N/A] % Reduction in Area: [8:-524.90%] [N/A:N/A] % Reduction in Volume: -842.70% [N/A:N/A] Classification: [8:Full Thickness Without Exposed Support Structures] [N/A:N/A] Exudate Amount: [8:Small] [N/A:N/A] Exudate Type: [8:Purulent] [N/A:N/A] Exudate Color: [8:yellow, brown, green] [N/A:N/A] Wound Margin:  [8:Flat and Intact] [N/A:N/A] Granulation Amount: [8:None Present (0%)] [N/A:N/A] Necrotic Amount: [8:Large (67-100%)] [N/A:N/A] Necrotic Tissue: [8:Eschar, Adherent Slough] [N/A:N/A] Exposed Structures: [8:Fascia: No Fat: No Tendon: No] [N/A:N/A] Muscle: No Joint: No Bone: No Limited to Skin Breakdown Epithelialization: None N/A N/A Periwound Skin Texture: Edema: No N/A N/A Excoriation: No Induration: No Callus: No Crepitus: No Fluctuance: No Friable: No Rash: No Scarring: No Periwound Skin Maceration: No N/A N/A Moisture: Moist: No Dry/Scaly: No Periwound Skin Color: Atrophie Blanche: No N/A N/A Cyanosis: No Ecchymosis: No Erythema: No Hemosiderin Staining: No Mottled: No Pallor: No Rubor: No Temperature: No Abnormality N/A  N/A Tenderness on No N/A N/A Palpation: Wound Preparation: Ulcer Cleansing: N/A N/A Rinsed/Irrigated with Saline Topical Anesthetic Applied: Other: lidocaine 4% Treatment Notes Electronic Signature(s) Signed: 06/30/2015 4:10:23 PM By: [name] Gurney, [name], BSN, [name], BSN Entered By: [name] Gurney, [name], BSN, [name] on 06/30/2015 13:10:14 [name], [name] ([id number]) -------------------------------------------------------------------------------- Multi-Disciplinary Care Plan Details Patient Name: [name], [name]. Date of Service: 06/30/2015 1:00 PM Medical Record Number: [id number] Patient Account Number: [id number] Date of Birth/Sex: 1928/09/25 (81 y.o. Female) Treating [name]: [name] Primary Care Physician: Ronna Polio Other Clinician: Referring Physician: Ronna Polio Treating Physician/Extender: Rudene Re in Treatment: 8 Active Inactive Abuse / Safety / Falls / Self Care Management Nursing Diagnoses: Impaired physical mobility Potential for falls Goals: Patient will remain injury free Date Initiated: 05/05/2015 Goal Status: Active Interventions: Assess fall risk on admission and as needed Notes: Wound/Skin Impairment Nursing  Diagnoses: Impaired tissue integrity Goals: Ulcer/skin breakdown will have a volume reduction of 30% by week 4 Date Initiated: 05/05/2015 Goal Status: Active Ulcer/skin breakdown will have a volume reduction of 50% by week 8 Date Initiated: 05/05/2015 Goal Status: Active Ulcer/skin breakdown will have a volume reduction of 80% by week 12 Date Initiated: 05/05/2015 Goal Status: Active Ulcer/skin breakdown will heal within 14 weeks Date Initiated: 05/05/2015 Goal Status: Active Interventions: Assess ulceration(s) every visit [name], [name] ([id number]) Provide education on ulcer and skin care Notes: Electronic Signature(s) Signed: 06/30/2015 4:10:23 PM By: [name] Gurney, [name], BSN, [name], BSN Entered By: [name] Gurney, [name], BSN, [name] on 06/30/2015 13:09:59 [name], [name] ([id number]) -------------------------------------------------------------------------------- Pain Assessment Details Patient Name: [name] Date of Service: 06/30/2015 1:00 PM Medical Record Number: [id number] Patient Account Number: [id number] Date of Birth/Sex: [birth year] (81 y.o. Female) Treating [name]: [name] Primary Care Physician: Ronna Polio Other Clinician: Referring Physician: Ronna Polio Treating Physician/Extender: Rudene Re in Treatment: 8 Active Problems Location of Pain Severity and Description of Pain Patient Has Paino No Site Locations Pain Management and Medication Current Pain Management: Electronic Signature(s) Signed: 06/30/2015 4:10:23 PM By: [name] Gurney, [name], BSN, [name], BSN Entered By: [name] Gurney, [name], BSN, [name] on 06/30/2015 12:57:45 [name] ([id number]) -------------------------------------------------------------------------------- Patient/Caregiver Education Details Patient Name: [name] Date of Service: 06/30/2015 1:00 PM Medical Record Number: [id number] Patient Account Number: [id number] Date of Birth/Gender: [birth year] (81 y.o. Female) Treating [name]: [name] Primary  Care Physician: Ronna Polio Other Clinician: Referring Physician: Ronna Polio Treating Physician/Extender: Rudene Re in Treatment: 8 Education Assessment Education Provided To: Patient Education Topics Provided Wound/Skin Impairment: Handouts: Caring for Your Ulcer Methods: Demonstration Responses: State content correctly Electronic Signature(s) Signed: 06/30/2015 4:10:23 PM By: [name] Gurney, [name], BSN, [name], BSN Entered By: [name] Gurney, [name], BSN, [name] on 06/30/2015 13:26:04 [name] ([id number]) -------------------------------------------------------------------------------- Wound Assessment Details Patient Name: [name], [name]. Date of Service: 06/30/2015 1:00 PM Medical Record Number: [id number] Patient Account Number: [id number] Date of Birth/Sex: Oct 11, 1928 (81 y.o. Female) Treating [name]: [name] Primary Care Physician: Ronna Polio Other Clinician: Referring Physician: Ronna Polio Treating Physician/Extender: Rudene Re in Treatment: 8 Wound Status Wound Number: 8 Primary Atypical Etiology: Wound Location: Abdomen - midline - Medial Wound Status: Open Wounding Event: Not Known Comorbid Cataracts, Hypertension, Type II Date Acquired: 04/23/2003 History: Diabetes Weeks Of Treatment: 8 Clustered Wound: Yes Photos Photo Uploaded By: [name] Gurney, [name], BSN, [name] on 06/30/2015 16:14:45 Wound Measurements Length: (cm) 2 Width: (cm) 1.5 Depth: (cm) 0.3 Area: (cm) 2.356 Volume: (cm) 0.707 % Reduction in Area: -524.9% % Reduction in Volume: -842.7% Epithelialization: None  Wound Description Full Thickness Without Exposed Classification: Support Structures Wound Margin: Flat and Intact Exudate Small Amount: Exudate Type: Purulent Exudate Color: yellow, brown, green Foul Odor After Cleansing: No Wound Bed Granulation Amount: None Present (0%) Exposed Structure Necrotic Amount: Large (67-100%) Fascia Exposed: No Necrotic Quality: Eschar,  Adherent Slough Fat Layer Exposed: No Lamia, Aaliyan O. ([id number]) Tendon Exposed: No Muscle Exposed: No Joint Exposed: No Bone Exposed: No Limited to Skin Breakdown Periwound Skin Texture Texture Color No Abnormalities Noted: No No Abnormalities Noted: No Callus: No Atrophie Blanche: No Crepitus: No Cyanosis: No Excoriation: No Ecchymosis: No Fluctuance: No Erythema: No Friable: No Hemosiderin Staining: No Induration: No Mottled: No Localized Edema: No Pallor: No Rash: No Rubor: No Scarring: No Temperature / Pain Moisture Temperature: No Abnormality No Abnormalities Noted: No Dry / Scaly: No Maceration: No Moist: No Wound Preparation Ulcer Cleansing: Rinsed/Irrigated with Saline Topical Anesthetic Applied: Other: lidocaine 4%, Treatment Notes Wound #8 (Medial Abdomen - midline) 1. Cleansed with: Clean wound with Normal Saline 2. Anesthetic Topical Lidocaine 4% cream to wound bed prior to debridement 4. Dressing Applied: Other dressing (specify in notes) 5. Secondary Dressing Applied Bordered Foam Dressing Notes sorbact with hydrogel Electronic Signature(s) Signed: 06/30/2015 4:10:23 PM By: [name] GurneyWoody, [name], BSN, [name], BSN Entered By: [name] GurneyWoody, [name], BSN, [name] on 06/30/2015 13:06:35 [name], [name] ([id number]) -------------------------------------------------------------------------------- Vitals Details Patient Name: [name], [name] Date of Service: 06/30/2015 1:00 PM Medical Record Number: [id number] Patient Account Number: [id number] Date of Birth/Sex: 06/28/1929 (81 y.o. Female) Treating [name]: [name] CoventryWoody, [name] Primary Care Physician: Ronna PolioWalker, [name] Other Clinician: Referring Physician: Ronna PolioWalker, [name] Treating Physician/Extender: Rudene ReBritto, Errol Weeks in Treatment: 8 Vital Signs Time Taken: 12:57 Temperature (F): 97.7 Height (in): 59 Pulse (bpm): 74 Weight (lbs): 180 Respiratory Rate (breaths/min): 16 Body Mass Index (BMI): 36.4 Blood Pressure (mmHg):  200/89 Reference Range: 80 - 120 mg / dl Electronic Signature(s) Signed: 06/30/2015 4:10:23 PM By: [name] GurneyWoody, [name], BSN, [name], BSN Entered By: [name] GurneyWoody, [name], BSN, [name] on 06/30/2015 13:01:12

## 2015-06-30 NOTE — Patient Instructions (Addendum)
Decrease Lantus to 70 units daily.  Start Amlodipine 2.5mg  daily to help control blood pressure.  Follow up in 4 weeks.

## 2015-06-30 NOTE — Assessment & Plan Note (Signed)
BG lower recently with increase in dose of Lantus to 75units daily. She is at high risk of falls and we discussed goal of avoiding low BG. Will decrease Lantus back to 70units. She will call if any BG<70. Follow up in 4 weeks and prn.

## 2015-06-30 NOTE — Assessment & Plan Note (Signed)
BP Readings from Last 3 Encounters:  06/30/15 174/92  06/01/15 173/92  05/17/15 195/88   BP elevated last 3 checks. Will try adding low dose of Amlodipine to help lower BP. Follow up recheck in 4 weeks.

## 2015-06-30 NOTE — Progress Notes (Signed)
Subjective:    Patient ID: [name], female    DOB: 08/03/1929, [age] y.o.   MRN: [id number]  HPI  81YO female presents for follow up.  DM - Last A1c in 04/2015 6.6%. Taking Lantus 75units daily. Few low readings in 60s. Does not feel well when BG low. No recent changes in diet.  HTN - Compliant with medication. No HA, chest pain. Does not check BP at home.  Right shoulder pain - Started PT last visit. Continues in PT. Pain has been improved. Continues to take Hydrocodone prn for both chronic knee pain, which helps with shoulder pain.   Wt Readings from Last 3 Encounters:  06/30/15 178 lb 4 oz (80.854 kg)  06/01/15 176 lb 2 oz (79.89 kg)  05/17/15 180 lb (81.647 kg)   BP Readings from Last 3 Encounters:  06/30/15 174/92  06/01/15 173/92  05/17/15 195/88    Past Medical History  Diagnosis Date  . Anxiety   . Chronic diastolic CHF (congestive heart failure) (HCC)   . Depression   . Diabetes mellitus     [facility]  . GERD (gastroesophageal reflux disease)   . Hypertension   . Hypothyroidism   . Osteoporosis   . Allergy   . Hyperlipidemia   . RLS (restless legs syndrome)   . Pancreatic disease     pancreatic failure  . Urinary incontinence   . Pancreatitis     Pancreatic resection/ open wound- [facility] 11/04  . Pneumonia     Pneumonia/ Emphysema 05/05  . 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 TIA (transient ischemic attack)     recurrent 12/15  . History of pleural empyema   . Sepsis (HCC)   . Frequent falls   . Arth[facility]     [facility]  . CAD (coronary artery disease), native coronary artery      1//15  T mid-LAD  stent OM2  . Parkinson's disease (HCC)    Family History  Problem Relation Age of Onset  . Heart disease Mother   . Heart attack Mother   . Hypertension Mother   . Heart attack Father    Past Surgical History  Procedure Laterality Date  . Breast lumpectomy     Lumpectomy right breast  . 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 Abdominal hysterectomy      Hysteroscopy/ [facility] & C ([place]) 12/05  . Removal of pancreas  2004  . Kyphoplasty  2005  . Broken shoulder and orbital bone  2005  . Cholecystectomy  1973  . Thoracentesis    . Vaginal delivery      9  . Cardiac catheterization  08/29/2013    x1 stent @ [facility]  . Cataract surgery     Social History   Social History  . Marital Status: Widowed    Spouse Name: N/A  . Number of Children: 7  . Years of Education: 6   Occupational History  . 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 Smoking status: Never Smoker   . Smokeless tobacco: Never Used  . Alcohol Use: No  . Drug Use: No  . Sexual Activity: Not Asked   Other Topics Concern  . 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 Has 7 children.  Has [name], daughter, as health care POA   Not sure about DNR   Permission to speak to daughter-in-law, Rosella Crandell, regarding health care matters.    Review of Systems  Constitutional: Negative for fever, chills, appetite change, fatigue and unexpected weight change.  Eyes: Negative for visual disturbance.  Respiratory: Negative for shortness of breath.   Cardiovascular: Negative for chest pain and leg swelling.  Gastrointestinal: Negative for nausea, vomiting, abdominal pain, diarrhea and constipation.  Musculoskeletal: Positive for myalgias, back pain, arthralgias and gait problem.  Skin: Negative for color change and rash.  Neurological: Positive for weakness.  Hematological: Negative for adenopathy. Does not bruise/bleed easily.  Psychiatric/Behavioral: Negative for dysphoric mood. The patient is not nervous/anxious.        Objective:    BP 174/92 mmHg  Pulse 72  Temp(Src) 98 F (36.7 C) (Oral)  Wt 178 lb 4 oz (80.854 kg)  SpO2 98%  LMP  (LMP  Unknown) Physical Exam  Constitutional: She is oriented to person, place, and time. She appears well-developed and well-nourished. No distress.  HENT:  Head: Normocephalic and atraumatic.  Right Ear: External ear normal.  Left Ear: External ear normal.  Nose: Nose normal.  Mouth/Throat: Oropharynx is clear and moist. No oropharyngeal exudate.  Eyes: Conjunctivae are normal. Pupils are equal, round, and reactive to light. Right eye exhibits no discharge. Left eye exhibits no discharge. No scleral icterus.  Neck: Normal range of motion. Neck supple. No tracheal deviation present. No thyromegaly present.  Cardiovascular: Normal rate, regular rhythm, normal heart sounds and intact distal pulses.  Exam reveals no gallop and no friction rub.   No murmur heard. Pulmonary/Chest: Effort normal and breath sounds normal. No respiratory distress. She has no wheezes. She has no rales. She exhibits no tenderness.  Musculoskeletal: Normal range of motion. She exhibits no edema or tenderness.  Lymphadenopathy:    She has no cervical adenopathy.  Neurological: She is alert and oriented to person, place, and time. No cranial nerve deficit. She exhibits normal muscle tone. Coordination normal.  Skin: Skin is warm and dry. No rash noted. She is not diaphoretic. No erythema. No pallor.  Psychiatric: She has a normal mood and affect. Her behavior is normal. Judgment and thought content normal.          Assessment & Plan:   Problem List Items Addressed This Visit      High   Diabetes type 2, controlled (HCC) - Primary (Chronic)    BG lower recently with increase in dose of Lantus to 75units daily. She is at high risk of falls and we discussed goal of avoiding low BG. Will decrease Lantus back to 70units. She will call if any BG<70. Follow up in 4 weeks and prn.        Unprioritized   Essential hypertension (Chronic)    BP Readings from Last 3 Encounters:  06/30/15 174/92  06/01/15 173/92  05/17/15  195/88   BP elevated last 3 checks. Will try adding low dose of Amlodipine to help lower BP. Follow up recheck in 4 weeks.      Relevant Medications   amLODipine (NORVASC) 2.5 MG tablet   Osteoarthrosis, unspecified whether generalized or localized, involving lower leg   Relevant Medications   HYDROcodone-acetaminophen (NORCO/VICODIN) 5-325 MG tablet   Right shoulder pain    Right shoulder pain improving with PT. Will continue. Continue Hydrocodone prn.          Return in about 4 weeks (around 07/28/2015) for  Recheck.

## 2015-06-30 NOTE — Assessment & Plan Note (Signed)
Right shoulder pain improving with PT. Will continue. Continue Hydrocodone prn.

## 2015-06-30 NOTE — Progress Notes (Signed)
Pre visit review using our clinic review tool, if applicable. No additional management support is needed unless otherwise documented below in the visit note. 

## 2015-06-30 NOTE — Addendum Note (Signed)
Addended by: Jennelle HumanNEWTON, ASHLEIGH E on: 06/30/2015 02:57 PM   Modules accepted: Orders

## 2015-07-01 NOTE — Progress Notes (Signed)
[name] ([id number]) Visit Report for 06/30/2015 Chief Complaint Document Details Patient Name: [name], [name]. Date of Service: 06/30/2015 1:00 PM Medical Record Number: [id number] Patient Account Number: [id number] Date of Birth/Sex: 28-Nov-1928 (81 y.o. 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 Female) Treating RN: Huel Coventry Primary Care Physician: Ronna Polio Other Clinician: Referring Physician: Ronna Polio Treating Physician/Extender: Rudene Re in Treatment: 8 Debridement Performed for Wound #8 Medial Abdomen - midline Assessment: Performed By: Physician Evlyn Kanner, MD Debridement: Debridement Pre-procedure Yes Verification/Time Out Taken: Start Time: 01:10 Pain Control: Lidocaine 5% topical ointment Level: Skin/Subcutaneous Tissue Total Area Debrided (L x 2 (cm) x 1.5 (cm) = 3 (cm) W): Tissue and other Viable, Non-Viable, Eschar, Exudate, Fibrin/Slough, Subcutaneous material debrided: Instrument: Forceps Bleeding: Minimum Hemostasis Achieved: Pressure End Time: 01:15 Procedural Pain: 0 Post Procedural Pain: 0 Response to Treatment:  Procedure was tolerated well Post Debridement Measurements of Total Wound Length: (cm) 2 Width: (cm) 1.5 Depth: (cm) 0.3 Volume: (cm) 0.707 Post Procedure Diagnosis Same as Pre-procedure Electronic Signature(s) Signed: 06/30/2015 1:26:00 PM By: Evlyn Kanner MD, FACS Signed: 06/30/2015 4:10:23 PM By: Elliot Gurney RN, BSN, Kim RN, BSN Previous Signature: 06/30/2015 1:25:34 PM Version By: Evlyn Kanner MD, FACS Entered By: Evlyn Kanner on 06/30/2015 13:25:59 [name] ([id number]) -------------------------------------------------------------------------------- HPI Details Patient Name: [name], [name]. Date of Service: 06/30/2015 1:00 PM Medical Record Number: [id number] Patient Account Number: [id number] Date of Birth/Sex: [birth year] (81 y.o. Female) Treating RN: Huel Coventry Primary Care Physician: Ronna Polio Other Clinician: Referring Physician: Ronna Polio Treating Physician/Extender: Rudene Re in Treatment: 8 History of Present Illness Location: ulceration on the epigastric region Quality: Patient reports No Pain. Severity: Patient states wound (s) are getting better. Duration: Patient has had the wound for < 4 weeks prior to presenting for treatment Context: The wound occurred when the patient started having some profuse bleeding from the wound and was admitted to the hospital and they noticed that the bleeding had stopped. Associated Signs and Symptoms: Wound has minimal bloody drainage HPI Description: Pleasant 81 year old with h/o ex lap for pancreatitis, DM, CHF. returns to clinic for evaluation of her open abdominal wound. She has been applying silver alginate. She is without complaints today. No significant pain. No drainage. No fever or chills. Tolerating a regular diet and having regular bowel movements. 06/02/2015 -- patient has had multiple areas which are now draining fluid and form an eschar. Electronic Signature(s) Signed: 06/30/2015 1:26:12 PM  By: Evlyn Kanner MD, FACS Entered By: Evlyn Kanner on 06/30/2015 13:26:12 [name] ([id number]) -------------------------------------------------------------------------------- Physical Exam Details Patient Name: [name] Date of Service: 06/30/2015 1:00 PM Medical Record Number: [id number] Patient Account Number: [id number] Date of Birth/Sex: 12-11-1928 (81 y.o. Female) Treating RN: Huel Coventry Primary Care Physician: Ronna Polio Other Clinician: Referring Physician: Ronna Polio Treating Physician/Extender: Rudene Re in Treatment: 8 Constitutional . Pulse regular.  Respirations normal and unlabored. Afebrile. . Eyes Nonicteric. Reactive to light. Ears, Nose, Mouth, and Throat Lips, teeth, and gums WNL.Marland Kitchen. Moist mucosa without lesions . Neck supple and nontender. No palpable supraclavicular or cervical adenopathy. Normal sized without goiter. Respiratory WNL. No retractions.. Cardiovascular Pedal Pulses WNL. No clubbing, cyanosis or edema. Lymphatic No adneopathy. No adenopathy. No adenopathy. Musculoskeletal Adexa without tenderness or enlargement.. Digits and nails w/o clubbing, cyanosis, infection, petechiae, ischemia, or inflammatory conditions.. Integumentary (Hair, Skin) No suspicious lesions. No crepitus or fluctuance. No peri-wound warmth or erythema. No masses.Marland Kitchen. Psychiatric Judgement and insight Intact.. No evidence of depression, anxiety, or agitation.. Notes The area in the central abdomen to the right and at about the 10:00 position has some depth to it and has become larger with some slough which was sharply debrided. Electronic Signature(s) Signed: 06/30/2015 1:27:05 PM By: Evlyn KannerBritto, Cali Cuartas MD, FACS Entered By: Evlyn KannerBritto, Jens Siems on 06/30/2015 13:27:04 [name] SpringLLOYD, [name] ([id number]) -------------------------------------------------------------------------------- Physician Orders Details Patient Name: [name] SpringLLOYD, [name] Date of Service:  06/30/2015 1:00 PM Medical Record Number: [id number] Patient Account Number: [id number] Date of Birth/Sex: 12/17/1928 (81 y.o. Female) Treating RN: Huel CoventryWoody, Kim Primary Care Physician: Ronna PolioWalker, [name] Other Clinician: Referring Physician: Ronna PolioWalker, [name] Treating Physician/Extender: Rudene ReBritto, Stephannie Broner Weeks in Treatment: 8 Verbal / Phone Orders: Yes Clinician: Huel CoventryWoody, Kim Read Back and Verified: Yes Diagnosis Coding Wound Cleansing Wound #8 Medial Abdomen - midline o Clean wound with Normal Saline. Anesthetic Wound #8 Medial Abdomen - midline o Topical Lidocaine 4% cream applied to wound bed prior to debridement Skin Barriers/Peri-Wound Care Wound #8 Medial Abdomen - midline o Skin Prep Primary Wound Dressing Wound #8 Medial Abdomen - midline o Other: - Sorbact with Hydrogel Secondary Dressing Wound #8 Medial Abdomen - midline o Boardered Foam Dressing Dressing Change Frequency Wound #8 Medial Abdomen - midline o Change Dressing Monday, Wednesday, Friday Follow-up Appointments Wound #8 Medial Abdomen - midline o Return Appointment in 2 weeks. - or as she is able Home Health Wound #8 Medial Abdomen - midline o Continue Home Health Visits - [facility] o Home Health Nurse may visit PRN to address patientos wound care needs. o FACE TO FACE ENCOUNTER: MEDICARE and MEDICAID PATIENTS: I certify that this patient is under my care and that I had a face-to-face encounter that meets the physician face-to-face encounter requirements with this patient on this date. The encounter with the patient was in [name] SpringLLOYD, Branae O. ([id number]) whole or in part for the following MEDICAL CONDITION: (primary reason for Home Healthcare) MEDICAL NECESSITY: I certify, that based on my findings, NURSING services are a medically necessary home health service. HOME BOUND STATUS: I certify that my clinical findings support that this patient is homebound (i.e., Due to illness or injury, pt requires  aid of supportive devices such as crutches, cane, wheelchairs, walkers, the use of special transportation or the assistance of another person to leave their place of residence. There is a normal inability to leave the home and doing so requires considerable and taxing effort. Other absences are for medical reasons / religious services and are infrequent or of short duration when for other reasons). o If current dressing causes regression in wound condition, may D/C ordered dressing product/s and apply Normal Saline Moist Dressing daily until next Wound Healing Center / Other MD appointment. Notify Wound Healing Center of regression in wound condition at [phone]. o Please direct any NON-WOUND related issues/requests for orders to patient's Primary Care Physician Electronic Signature(s) Signed: 06/30/2015 4:10:23 PM By: Elliot GurneyWoody, RN, BSN, Kim  RN, BSN Signed: 06/30/2015 4:34:10 PM By: Evlyn Kanner MD, FACS Entered By: Elliot Gurney RN, BSN, Kim on 06/30/2015 13:13:44 [name], [name] ([id number]) -------------------------------------------------------------------------------- Problem List Details Patient Name: BREAH, JOA. Date of Service: 06/30/2015 1:00 PM Medical Record Number: [id number] Patient Account Number: [id number] Date of Birth/Sex: 1929/05/27 (81 y.o. Female) Treating RN: Huel Coventry Primary Care Physician: Ronna Polio Other Clinician: Referring Physician: Ronna Polio Treating Physician/Extender: Rudene Re in Treatment: 8 Active Problems ICD-10 Encounter Code Description Active Date Diagnosis E11.622 Type 2 diabetes mellitus with other skin ulcer 05/05/2015 Yes S31.102S Unspecified open wound of abdominal wall, epigastric 05/05/2015 Yes region without penetration into peritoneal cavity, sequela E66.09 Other obesity due to excess calories 05/05/2015 Yes Inactive Problems Resolved Problems Electronic Signature(s) Signed: 06/30/2015 1:24:01 PM By: Evlyn Kanner MD, FACS Entered By: Evlyn Kanner on 06/30/2015 13:24:01 [name] ([id number]) -------------------------------------------------------------------------------- Progress Note Details Patient Name: [name] Date of Service: 06/30/2015 1:00 PM Medical Record Number: [id number] Patient Account Number: [id number] Date of Birth/Sex: 1928-09-09 (81 y.o. 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 Severity: Patient states wound (s) are getting better. Duration: Patient has had the wound for < 4 weeks prior to presenting for treatment Context: The wound occurred when the patient started having some profuse bleeding from the wound and was admitted to the hospital and they noticed that the bleeding had stopped. Associated Signs and Symptoms: Wound has minimal bloody drainage Pleasant 81 year old with h/o ex lap for pancreatitis, DM, CHF. returns to clinic for evaluation of her open abdominal wound. She has been applying silver alginate. She is without complaints today. No significant pain. No drainage. No fever or chills. Tolerating a regular diet and having regular bowel movements. 06/02/2015 -- patient has had multiple areas which are now draining fluid and form an eschar. Objective Constitutional Pulse regular. Respirations normal and unlabored. Afebrile. Vitals Time Taken: 12:57 PM, Height: 59 in, Weight: 180  lbs, BMI: 36.4, Temperature: 97.7 F, Pulse: 74 bpm, Respiratory Rate: 16 breaths/min, Blood Pressure: 200/89 mmHg. Eyes [name], [name] ([id number]) Nonicteric. Reactive to light. Ears, Nose, Mouth, and Throat Lips, teeth, and gums WNL.Marland Kitchen Moist mucosa without lesions . Neck supple and nontender. No palpable supraclavicular or cervical adenopathy. Normal sized without goiter. Respiratory WNL. No retractions.. Cardiovascular Pedal Pulses WNL. No clubbing, cyanosis or edema. Lymphatic No adneopathy. No adenopathy. No adenopathy. Musculoskeletal Adexa without tenderness or enlargement.. Digits and nails w/o clubbing, cyanosis, infection, petechiae, ischemia, or inflammatory conditions.Marland Kitchen Psychiatric Judgement and insight Intact.. No evidence of depression, anxiety, or agitation.. General Notes: The area in the central abdomen to the right and at about the 10:00 position has some depth to it and has become larger with some slough which was sharply debrided. Integumentary (Hair, Skin) No suspicious lesions. No crepitus or fluctuance. No peri-wound warmth or erythema. No masses.. Wound #8 status is Open. Original cause of wound was Not Known. The wound is located on the Medial Abdomen - midline. The wound measures 2cm length x 1.5cm width x 0.3cm  depth; 2.356cm^2 area and 0.707cm^3 volume. The wound is limited to skin breakdown. There is a small amount of purulent drainage noted. The wound margin is flat and intact. There is no granulation within the wound bed. There is a large (67-100%) amount of necrotic tissue within the wound bed including Eschar and Adherent Slough. The periwound skin appearance did not exhibit: Callus, Crepitus, Excoriation, Fluctuance, Friable, Induration, Localized Edema, Rash, Scarring, Dry/Scaly, Maceration, Moist, Atrophie Blanche, Cyanosis, Ecchymosis, Hemosiderin Staining, Mottled, Pallor, Rubor, Erythema. Periwound temperature was noted as  No Abnormality. Assessment Active Problems ICD-10 E11.622 - Type 2 diabetes mellitus with other skin ulcer [name], [name] ([id number]) S31.102S - Unspecified open wound of abdominal wall, epigastric region without penetration into peritoneal cavity, sequela E66.09 - Other obesity due to excess calories I have recommended the change to Sorbact and hydrogel to be applied to the wound with a bordered foam dressing and this is to be changed every other day. I have asked her to come to see as more often as she comes about once a month. She is agreeable. Procedures Wound #8 Wound #8 is an Atypical located on the Medial Abdomen - midline . There was a Skin/Subcutaneous Tissue Debridement ([id number]) debridement with total area of 3 sq cm performed by Evlyn Kanner, MD. with the following instrument(s): Forceps to remove Viable and Non-Viable tissue/material including Exudate, Fibrin/Slough, Eschar, and Subcutaneous after achieving pain control using Lidocaine 5% topical ointment. A time out was conducted prior to the start of the procedure. A Minimum amount of bleeding was controlled with Pressure. The procedure was tolerated well with a pain level of 0 throughout and a pain level of 0 following the procedure. Post Debridement Measurements: 2cm length x 1.5cm width x 0.3cm depth; 0.707cm^3 volume. Post procedure Diagnosis Wound #8: Same as Pre-Procedure Plan Wound Cleansing: Wound #8 Medial Abdomen - midline: Clean wound with Normal Saline. Anesthetic: Wound #8 Medial Abdomen - midline: Topical Lidocaine 4% cream applied to wound bed prior to debridement Skin Barriers/Peri-Wound Care: Wound #8 Medial Abdomen - midline: Skin Prep Primary Wound Dressing: Wound #8 Medial Abdomen - midline: Other: - Sorbact with Hydrogel Secondary Dressing: Wound #8 Medial Abdomen - midline: [name], [name] ([id number]) Boardered Foam Dressing Dressing Change Frequency: Wound #8 Medial Abdomen -  midline: Change Dressing Monday, Wednesday, Friday Follow-up Appointments: Wound #8 Medial Abdomen - midline: Return Appointment in 2 weeks. - or as she is able Home Health: Wound #8 Medial Abdomen - midline: Continue Home Health Visits - [facility] Nurse may visit PRN to address patient s wound care needs. FACE TO FACE ENCOUNTER: MEDICARE and MEDICAID PATIENTS: I certify that this patient is under my care and that I had a face-to-face encounter that meets the physician face-to-face encounter requirements with this patient on this date. The encounter with the patient was in whole or in part for the following MEDICAL CONDITION: (primary reason for Home Healthcare) MEDICAL NECESSITY: I certify, that based on my findings, NURSING services are a medically necessary home health service. HOME BOUND STATUS: I certify that my clinical findings support that this patient is homebound (i.e., Due to illness or injury, pt requires aid of supportive devices such as crutches, cane, wheelchairs, walkers, the use of special transportation or the assistance of another person to leave their place of residence. There is a normal inability to leave the home and doing so requires considerable and taxing effort. Other absences are for medical reasons / religious services and are infrequent or of  short duration when for other reasons). If current dressing causes regression in wound condition, may D/C ordered dressing product/s and apply Normal Saline Moist Dressing daily until next Wound Healing Center / Other MD appointment. Notify Wound Healing Center of regression in wound condition at [phone]. Please direct any NON-WOUND related issues/requests for orders to patient's Primary Care Physician I have recommended the change to Sorbact and hydrogel to be applied to the wound with a bordered foam dressing and this is to be changed every other day. I have asked her to come to see as more often as  she comes about once a month. She is agreeable. Electronic Signature(s) Signed: 06/30/2015 1:28:19 PM By: Evlyn Kanner MD, FACS Entered By: Evlyn Kanner on 06/30/2015 13:28:19 NASHAY, BRICKLEY ([id number]) -------------------------------------------------------------------------------- SuperBill Details Patient Name: [name] Date of Service: 06/30/2015 Medical Record Number: [id number] Patient Account Number: [id number] Date of Birth/Sex: 21-Jan-1929 (81 y.o. Female) Treating RN: Huel Coventry Primary Care Physician: Ronna Polio Other Clinician: Referring Physician: Ronna Polio Treating Physician/Extender: Rudene Re in Treatment: 8 Diagnosis Coding ICD-10 Codes Code Description E11.622 Type 2 diabetes mellitus with other skin ulcer Unspecified open wound of abdominal wall, epigastric region without penetration into S31.102S peritoneal cavity, sequela E66.09 Other obesity due to excess calories Facility Procedures CPT4: Description Modifier Quantity Code [id number] 11042 - DEB SUBQ TISSUE 20 SQ CM/< 1 ICD-10 Description Diagnosis E11.622 Type 2 diabetes mellitus with other skin ulcer S31.102S Unspecified open wound of abdominal wall, epigastric region without  penetration into peritoneal cavity, sequela E66.09 Other obesity due to excess calories Physician Procedures CPT4: Description Modifier Quantity Code [id number] 11042 - WC PHYS SUBQ TISS 20 SQ CM 1 ICD-10 Description Diagnosis E11.622 Type 2 diabetes mellitus with other skin ulcer S31.102S Unspecified open wound of abdominal wall, epigastric region without  penetration into peritoneal cavity, sequela E66.09 Other obesity due to excess calories Electronic Signature(s) Signed: 06/30/2015 1:28:38 PM By: Evlyn Kanner MD, FACS Entered By: Evlyn Kanner on 06/30/2015 13:28:37

## 2015-07-06 ENCOUNTER — Telehealth: Payer: Self-pay

## 2015-07-06 NOTE — Telephone Encounter (Signed)
Home health nurse, Cordelia PenSherry called concerned.  When she went to do her wound care today, patient's BS was found to be 64, patient was diaphoretic, gave her some food to eat, rechecked 10 minutes later and it was 70.  After wound care checked a third time and it was 89. Nurse felt it was related to the patient not eating properly today or last night.  Patient is not eating appropriately, so sugar keeps dropping.  Cordelia PenSherry is concerned at they are almost at a point that they will not be coming out for her wound care due to length of wound and medicare not covering it anymore and she wants to make sure patient is getting what she needs.  Plans to see patient again on Wednesday and will call with an update then.

## 2015-07-06 NOTE — Telephone Encounter (Signed)
OK. We should probably also bring her for a follow up visit. May need to discuss with family moving her to SNF

## 2015-07-07 ENCOUNTER — Ambulatory Visit (INDEPENDENT_AMBULATORY_CARE_PROVIDER_SITE_OTHER): Payer: Medicare Other | Admitting: Cardiovascular Disease

## 2015-07-07 ENCOUNTER — Encounter: Payer: Self-pay | Admitting: Cardiovascular Disease

## 2015-07-07 VITALS — BP 184/97 | HR 72 | Ht 59.0 in | Wt 184.5 lb

## 2015-07-07 DIAGNOSIS — I2510 Atherosclerotic heart disease of native coronary artery without angina pectoris: Secondary | ICD-10-CM | POA: Diagnosis not present

## 2015-07-07 DIAGNOSIS — I639 Cerebral infarction, unspecified: Secondary | ICD-10-CM | POA: Diagnosis not present

## 2015-07-07 DIAGNOSIS — I480 Paroxysmal atrial fibrillation: Secondary | ICD-10-CM | POA: Diagnosis not present

## 2015-07-07 DIAGNOSIS — I10 Essential (primary) hypertension: Secondary | ICD-10-CM | POA: Diagnosis not present

## 2015-07-07 DIAGNOSIS — I5032 Chronic diastolic (congestive) heart failure: Secondary | ICD-10-CM | POA: Diagnosis not present

## 2015-07-07 MED ORDER — AMLODIPINE BESYLATE 5 MG PO TABS: 5.0000 mg | ORAL_TABLET | Freq: Every day | ORAL | Status: AC

## 2015-07-07 NOTE — Assessment & Plan Note (Signed)
Blood pressure is elevated. I increased the dose of amlodipine to 5 mg once daily. Clonidine and lisinopril are listed on her medications. However, according to the daughter she has not been taking these medications. If blood pressure continues to be elevated, we can resume lisinopril. We should avoid clonidine.

## 2015-07-07 NOTE — Assessment & Plan Note (Signed)
No symptoms of angina. Continue medical therapy. She does not need to be on dual antiplatelet therapy and thus I discontinued aspirin. I elected to keep her on Plavix given prior history of stroke.

## 2015-07-07 NOTE — Progress Notes (Signed)
Primary care physician: Dr. Ronna Polio   HPI  This is an 81 year old female who is here today for a followup visit regarding coronary artery disease, chronic diastolic heart failure and paroxysmal atrial fibrillation. She has known history of  DM, refractory HTN, Parkinson's disease, pancreatic insufficiency, and osteoarthritis .  Previous cardiac catheterization in 2015 showed chronically occluded mid LAD with right-to-left collaterals. There was high-grade stenosis in a large OM 2 extending to the apex. This was treated with angioplasty and drug-eluting stent placement.  She was hospitalized on December,2015 with stroke symptoms. MRI showed small area of infarct in the right parietal lobe. Echocardiogram was unremarkable. Loop recorder showed evidence of paroxysmal atrial fibrillation. She was started on anticoagulation with Eliquis.  Anticoagulation was discontinued due to frequent falls. It was again resumed in July but she was hospitalized with GI bleed. She has been doing reasonably well and denies any chest pain. Dyspnea is stable. Blood pressure continues to be elevated. Amlodipine was recently added by Dr. [name]  Allergies  Allergen Reactions  . Citalopram Hydrobromide Nausea And Vomiting and Other (See Comments)    Reaction:  Dizziness and dry mouth   . Diphenhydramine Other (See Comments)    Reaction:  Unknown   . Metoclopramide Other (See Comments)    Reaction:  Unknown   . Paroxetine Other (See Comments)    Reaction:  Hallucinations   . Tape Rash     Current Outpatient Prescriptions on File Prior to Visit  Medication Sig Dispense Refill  . albuterol (PROVENTIL HFA;VENTOLIN HFA) 108 (90 BASE) MCG/ACT inhaler Inhale 2 puffs into the lungs every 6 (six) hours as needed for wheezing or shortness of breath.    . ALPRAZolam (XANAX) 1 MG tablet TAKE 1/2 TABLET BY MOUTH IN THE MORNING,1/2 TABLET AT LUNCH AND 1 TABLET AT BEDTIME IF NEEDED 60 tablet 3  . busPIRone (BUSPAR)  10 MG tablet TAKE ONE (1) TABLET BY MOUTH TWO (2) TIMES DAILY 180 tablet 2  . cetirizine (ZYRTEC) 10 MG tablet Take 10 mg by mouth daily.    . clopidogrel (PLAVIX) 75 MG tablet Take 1 tablet (75 mg total) by mouth daily. 30 tablet 0  . cyanocobalamin (,VITAMIN B-12,) 1000 MCG/ML injection Inject 1 mL (1,000 mcg total) into the muscle every 30 (thirty) days. 30 mL 3  . fluticasone (FLONASE) 50 MCG/ACT nasal spray Place 2 sprays into both nostrils daily.    . furosemide (LASIX) 40 MG tablet Take 1 tablet (40 mg total) by mouth daily. 90 tablet 3  . gentamicin ointment (GARAMYCIN) 0.1 % Apply 1 application topically 3 (three) times daily. 30 g 0  . HYDROcodone-acetaminophen (NORCO/VICODIN) 5-325 MG tablet Take 1-2 tablets by mouth 3 (three) times daily as needed for moderate pain. 90 tablet 0  . Insulin Glargine (LANTUS) 100 UNIT/ML Solostar Pen Inject 70 Units into the skin daily at 10 pm. 15 mL 11  . insulin lispro (HUMALOG) 100 UNIT/ML KiwkPen Inject 0.08-0.18 mLs (8-18 Units total) into the skin 3 (three) times daily. 15 mL 11  . levothyroxine (SYNTHROID, LEVOTHROID) 75 MCG tablet Take 75 mcg by mouth daily.    . mirtazapine (REMERON) 15 MG tablet Take 1 tablet (15 mg total) by mouth at bedtime. 30 tablet 3  . Multiple Vitamins-Minerals (CENTRUM SILVER PO) Take 1 tablet by mouth daily.      Marland Kitchen nystatin (MYCOSTATIN/NYSTOP) 100000 UNIT/GM POWD Apply under breasts twice daily 60 g 6  . omeprazole (PRILOSEC) 20 MG capsule Take 1  capsule (20 mg total) by mouth daily. 90 capsule 3  . ondansetron (ZOFRAN) 4 MG tablet Take 1 tablet (4 mg total) by mouth every 8 (eight) hours as needed. (Patient taking differently: Take 4 mg by mouth every 8 (eight) hours as needed for nausea or vomiting. ) 90 tablet 11  . Pancrelipase, Lip-Prot-Amyl, (ZENPEP) 15000 UNITS CPEP Take 1 capsule by mouth 3 (three) times daily.    . pravastatin (PRAVACHOL) 40 MG tablet Take 20 mg by mouth daily.     Marland Kitchen tiZANidine (ZANAFLEX) 4 MG  tablet Take 1 tablet (4 mg total) by mouth 3 (three) times daily. 15 tablet 0  . triamcinolone cream (KENALOG) 0.1 % Apply topically 2 (two) times daily. (Patient taking differently: Apply 1 application topically 2 (two) times daily. ) 30 g 0   No current facility-administered medications on file prior to visit.     Past Medical History  Diagnosis Date  . Anxiety   . Chronic diastolic CHF (congestive heart failure) (HCC)   . Depression   . Diabetes mellitus     Follow[facility] by Dr. Tedd Sias at [facility]  . GERD (gastroesophageal reflux disease)   . Hypertension   . Hypothyroidism   . Osteoporosis   . Allergy   . Hyperlipidemia   . RLS (restless legs syndrome)   . Pancreatic disease     pancreatic failure  . Urinary incontinence   . Pancreatitis     Pancreatic resection/ open wound- [facility] 11/04  . Pneumonia     Pneumonia/ Emphysema 05/05  . 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 TIA (transient ischemic attack)     recurrent 12/15  . History of pleural empyema   . Sepsis (HCC)   . Frequent falls   . Arthritis     left knee, [facility]  . CAD (coronary artery disease), native coronary artery      1//15  T mid-LAD  stent OM2  . Parkinson's disease [facility])      Past Surgical History  Procedure Laterality Date  . Breast lumpectomy      Lumpectomy right breast  . 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 Abdominal hysterectomy      Hysteroscopy/ D & C ([facility]) 12/05  . Removal of pancreas  2004  . Kyphoplasty  2005  . Broken shoulder and orbital bone  2005  . Cholecystectomy  1973  . Thoracentesis    . Vaginal delivery      9  . Cardiac catheterization  08/29/2013    x1 stent @ [facility]  . Cataract surgery       Family History  Problem Relation Age of Onset  . Heart disease Mother   . Heart attack Mother   . Hypertension Mother   . Heart attack Father      Social History   Social  History  . Marital Status: Widowed    Spouse Name: N/A  . Number of Children: 7  . Years of Education: 6   Occupational History  . retired- Veterinary surgeon    Social History Main Topics  . Smoking status: Never Smoker   . Smokeless tobacco: Never Used  . Alcohol Use: No  . Drug Use: No  . Sexual Activity: Not on file   Other Topics Concern  . 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 Has 7 children.            Has Kendal HymenBonnie, daughter, as health care POA   Not sure about DNR   Permission to speak to daughter-in-law, Georgian Congela Ullman, regarding health care matters.     ROS A 10 point review of system was performed. It is negative other than that mentioned in the history of present illness.   PHYSICAL EXAM   BP 184/97 mmHg  Pulse 72  Ht 4\' 11"  (1.499 m)  Wt 184 lb 8 oz (83.689 kg)  BMI 37.24 kg/m2  LMP  (LMP Unknown) Constitutional: She is oriented to person, place, and time. She appears well-developed and well-nourished. No distress.  HENT: No nasal discharge.  Head: Normocephalic and atraumatic.  Eyes: Pupils are equal and round. No discharge.  Neck: Normal range of motion. Neck supple. No JVD present. No thyromegaly present.  Cardiovascular: Normal rate, regular rhythm, normal heart sounds. Exam reveals no gallop and no friction rub. No murmur heard.  Pulmonary/Chest: Effort normal and breath sounds normal. No stridor. No respiratory distress. She has no wheezes. She has no rales. She exhibits no tenderness.  Abdominal: Soft. Bowel sounds are normal. She exhibits no distension. There is no tenderness. There is no rebound and no guarding.  Musculoskeletal: Normal range of motion. She exhibits mild edema and no tenderness.  Neurological: She is alert and oriented to person, place, and time. Coordination normal.  Skin: Skin is warm and dry. No rash noted. She is not diaphoretic. No erythema. No pallor.    Psychiatric: She has a normal mood and affect. Her behavior is normal. Judgment and thought content normal.       ASSESSMENT AND PLAN

## 2015-07-07 NOTE — Assessment & Plan Note (Signed)
Given frequent falls, GI bleed and bleeding from abdominal wound related to previous pancreatic surgery, I do not recommend anticoagulation as she had multiple complications related to it.

## 2015-07-07 NOTE — Assessment & Plan Note (Signed)
She appears to be euvolemic on current dose of furosemide. 

## 2015-07-07 NOTE — Patient Instructions (Addendum)
Medication Instructions:  Your physician has recommended you make the following change in your medication:  STOP taking aspirin INCREASE amlodipine 5mg  once per day   Labwork: none  Testing/Procedures: none  Follow-Up: Your physician wants you to follow-up in: six months with Dr. Kirke CorinArida.  You will receive a reminder letter in the mail two months in advance. If you don't receive a letter, please call our office to schedule the follow-up appointment.   Any Other Special Instructions Will Be Listed Below (If Applicable).     If you need a refill on your cardiac medications before your next appointment, please call your pharmacy.

## 2015-07-08 ENCOUNTER — Telehealth: Payer: Self-pay | Admitting: Internal Medicine

## 2015-07-08 NOTE — Telephone Encounter (Signed)
Caller name: Bennetta Laosancy Wilson Relationship to patient: [facility] RN Can be reached:[phone]  Reason for call: needed to get an order to offer pt occupational therapy for 1 week ever week.

## 2015-07-08 NOTE — Telephone Encounter (Signed)
Left a detailed message for Bennetta LaosNancy Wilson.

## 2015-07-13 ENCOUNTER — Ambulatory Visit (INDEPENDENT_AMBULATORY_CARE_PROVIDER_SITE_OTHER): Payer: Medicare Other | Admitting: *Deleted

## 2015-07-13 DIAGNOSIS — I638 Other cerebral infarction: Secondary | ICD-10-CM | POA: Diagnosis not present

## 2015-07-13 DIAGNOSIS — I6389 Other cerebral infarction: Secondary | ICD-10-CM

## 2015-07-13 LAB — CUP PACEART REMOTE DEVICE CHECK: MDC IDC SESS DTM: 20161121170844

## 2015-07-14 ENCOUNTER — Encounter: Payer: Medicare Other | Admitting: Surgery

## 2015-07-14 DIAGNOSIS — E11622 Type 2 diabetes mellitus with other skin ulcer: Secondary | ICD-10-CM | POA: Diagnosis not present

## 2015-07-14 NOTE — Progress Notes (Signed)
[facility] Summary Report / Loop Recorder 

## 2015-07-15 NOTE — Progress Notes (Addendum)
[name] ([id number]) Visit Report for 07/14/2015 Chief Complaint Document Details Patient Name: [name], [name] 07/14/2015 1:00 Date of Service: PM Medical Record [id number] Number: Patient Account Number: [id number] 1928/11/10 (81 y.o. 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 Treating RN: Huel Coventry Date of Birth/Sex: Female) Other Clinician: Primary Care Physician: Ronna Polio Treating Evlyn Kanner Referring Physician: Ronna Polio Physician/Extender: Tania Ade in Treatment: 10 History of Present Illness Location: ulceration on the epigastric region Quality: Patient reports No Pain. Severity: Patient states wound (s) are getting better. Duration: Patient has had the wound for < 4 weeks prior to presenting for treatment Context: The wound occurred when the patient started having some profuse bleeding from the wound and was admitted to the hospital and they noticed that the bleeding had stopped. Associated Signs and Symptoms: Wound has minimal bloody drainage HPI Description: Pleasant 81 year old with h/o ex lap for pancreatitis, DM, CHF. returns to clinic for  evaluation of her open abdominal wound. She has been applying silver alginate. She is without complaints today. No significant pain. No drainage. No fever or chills. Tolerating a regular diet and having regular bowel movements. 06/02/2015 -- patient has had multiple areas which are now draining fluid and form an eschar. Electronic Signature(s) Signed: 07/14/2015 1:17:59 PM By: Evlyn Kanner MD, FACS Entered By: Evlyn Kanner on 07/14/2015 13:17:58 DANASHA, MELMAN ([id number]) -------------------------------------------------------------------------------- Physical Exam Details Patient Name: [name] 07/14/2015 1:00 Date of Service: PM Medical Record [id number] Number: Patient Account Number: [id number] June 26, 1929 (81 y.o. Treating RN: Huel Coventry Date of Birth/Sex: Female) Other Clinician: Primary Care Physician: Ronna Polio Treating Evlyn Kanner Referring Physician: Ronna Polio Physician/Extender: Tania Ade in Treatment: 10 Constitutional . Pulse regular. Respirations normal and unlabored. Afebrile. . Eyes Nonicteric. Reactive to light. Ears, Nose, Mouth, and Throat Lips, teeth, and gums WNL.Marland Kitchen Moist mucosa without lesions . Neck supple and nontender. No palpable supraclavicular or cervical adenopathy. Normal sized without goiter. Respiratory WNL. No retractions.. Cardiovascular Pedal Pulses WNL. No clubbing, cyanosis or edema. Lymphatic No adneopathy. No adenopathy. No adenopathy. Musculoskeletal Adexa without tenderness or enlargement.. Digits and nails w/o clubbing, cyanosis, infection, petechiae, ischemia, or inflammatory conditions.. Integumentary (Hair, Skin) No suspicious lesions. No crepitus or fluctuance. No peri-wound warmth or erythema. No masses.Marland Kitchen Psychiatric Judgement and insight Intact.. No evidence of depression, anxiety, or agitation.. Notes most of the other wounds have completely healed but the area around 10:00 continues to have  significant slough material sitting right on the mesh. Electronic Signature(s) Signed: 07/14/2015 1:18:35 PM By: Evlyn Kanner MD, FACS Entered By: Evlyn Kanner on 07/14/2015 13:18:35 NALAYSIA, MANGANIELLO ([id number]) -------------------------------------------------------------------------------- Physician Orders Details Patient Name: [name] 07/14/2015 1:00 Date of Service: PM Medical Record [id number] Number: Patient Account Number: [id number] Jan 07, 1929 (81 y.o. Treating RN: Phillis Haggis Date of Birth/Sex: Female) Other Clinician: Primary Care Physician: Ronna Polio Treating Evlyn Kanner Referring Physician: Ronna Polio Physician/Extender: Tania Ade  in Treatment: 10 Verbal / Phone Orders: Yes Clinician: Ashok Cordia, Debi Read Back and Verified: Yes Diagnosis Coding ICD-10 Coding Code Description E11.622 Type 2 diabetes mellitus with other skin ulcer Unspecified open wound of abdominal wall, epigastric region without penetration into S31.102S peritoneal cavity, sequela E66.09 Other obesity due to excess calories Wound Cleansing Wound #8 Medial Abdomen - midline o Clean wound with Normal Saline. Anesthetic Wound #8 Medial Abdomen - midline o Topical Lidocaine 4% cream applied to wound bed prior to debridement Skin Barriers/Peri-Wound Care Wound #8 Medial Abdomen - midline o Skin Prep Primary Wound Dressing Wound #8 Medial Abdomen - midline o Other: - Sorbact with Hydrogel Secondary Dressing Wound #8 Medial Abdomen - midline o Boardered Foam Dressing Dressing Change Frequency Wound #8 Medial Abdomen - midline o Change Dressing Monday, Wednesday, Friday Follow-up Appointments Wound #8 Medial Abdomen - midline Ridge, Tawnya O. ([id number]) o Return Appointment in 2 weeks. - or as she is able Home Health Wound #8 Medial Abdomen - midline o Continue Home Health Visits - [facility] o Home Health Nurse may visit PRN to address patientos  wound care needs. o FACE TO FACE ENCOUNTER: MEDICARE and MEDICAID PATIENTS: I certify that this patient is under my care and that I had a face-to-face encounter that meets the physician face-to-face encounter requirements with this patient on this date. The encounter with the patient was in whole or in part for the following MEDICAL CONDITION: (primary reason for Home Healthcare) MEDICAL NECESSITY: I certify, that based on my findings, NURSING services are a medically necessary home health service. HOME BOUND STATUS: I certify that my clinical findings support that this patient is homebound (i.e., Due to illness or injury, pt requires aid of supportive devices such as crutches, cane, wheelchairs, walkers, the use of special transportation or the assistance of another person to leave their place of residence. There is a normal inability to leave the home and doing so requires considerable and taxing effort. Other absences are for medical reasons / religious services and are infrequent or of short duration when for other reasons). o If current dressing causes regression in wound condition, may D/C ordered dressing product/s and apply Normal Saline Moist Dressing daily until next Wound Healing Center / Other MD appointment. Notify Wound Healing Center of regression in wound condition at [phone]. o Please direct any NON-WOUND related issues/requests for orders to patient's Primary Care Physician Electronic Signature(s) Signed: 07/14/2015 4:16:33 PM By: Alejandro Mulling Signed: 07/14/2015 4:47:37 PM By: Evlyn Kanner MD, FACS Entered By: Alejandro Mulling on 07/14/2015 13:26:03 MELODEE, LUPE ([id number]) -------------------------------------------------------------------------------- Problem List Details Patient Name: ENCARNACION, BOLE 07/14/2015 1:00 Date of Service: PM Medical Record [id number] Number: Patient Account Number: [id number] 1928-11-15 (81 y.o. Treating RN: Huel Coventry Date of Birth/Sex: Female) Other Clinician: Primary Care Physician: Ronna Polio Treating Evlyn Kanner Referring Physician: Ronna Polio Physician/Extender: Tania Ade in Treatment: 10 Active Problems ICD-10 Encounter Code Description Active Date Diagnosis E11.622 Type 2 diabetes mellitus with other skin ulcer 05/05/2015 Yes S31.102S Unspecified open wound of abdominal wall, epigastric 05/05/2015 Yes region without penetration into peritoneal cavity, sequela E66.09 Other obesity due to excess calories 05/05/2015 Yes Inactive Problems Resolved Problems Electronic Signature(s) Signed: 07/14/2015 1:17:48 PM By: Evlyn Kanner MD, FACS Entered By: Evlyn Kanner on 07/14/2015 13:17:48 [name] ([id number]) -------------------------------------------------------------------------------- Progress Note Details Patient Name: [name] 07/14/2015 1:00 Date of Service: PM Medical Record [id number] Number: Patient Account Number: [id number] 02/06/1929 (81 y.o. 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 Severity: Patient states wound (s) are getting better. Duration: Patient has had the wound for < 4 weeks prior to presenting for treatment Context: The wound occurred when the patient started having some profuse bleeding from the wound and was admitted to the hospital and they noticed  that the bleeding had stopped. Associated Signs and Symptoms: Wound has minimal bloody drainage Pleasant 81 year old with h/o ex lap for pancreatitis, DM, CHF. returns to clinic for evaluation of her open abdominal wound. She has been applying silver alginate. She is without complaints today. No significant pain. No drainage. No fever or chills. Tolerating a regular diet and having regular bowel movements. 06/02/2015 -- patient has had multiple areas which are now draining fluid and form an eschar. Objective Constitutional Pulse regular. Respirations normal and unlabored. Afebrile. Vitals Time Taken: 1:02 PM, Height: 59 in, Weight: 180 lbs, BMI: 36.4, Temperature: 97.6 F, Pulse: 70 bpm, Respiratory Rate: 18 breaths/min, Blood Pressure: 146/92 mmHg. DANIAL, SISLEY ([id number]) Eyes Nonicteric. Reactive to light. Ears, Nose, Mouth, and Throat Lips, teeth, and gums WNL.Marland Kitchen Moist mucosa without lesions . Neck supple and nontender. No palpable supraclavicular or cervical adenopathy. Normal sized without goiter. Respiratory WNL. No retractions.. Cardiovascular Pedal Pulses WNL. No clubbing, cyanosis or edema. Lymphatic No adneopathy. No adenopathy. No adenopathy. Musculoskeletal Adexa without tenderness or enlargement.. Digits and nails w/o clubbing, cyanosis, infection, petechiae, ischemia, or inflammatory conditions.Marland Kitchen Psychiatric Judgement and insight Intact.. No evidence of depression, anxiety, or agitation.. General Notes: most of the other wounds have completely healed but the area around 10:00 continues to have significant slough material sitting right on the mesh. Integumentary (Hair, Skin) No suspicious lesions. No crepitus or fluctuance. No peri-wound warmth or erythema. No masses.. Wound #8 status is Open. Original cause of wound was Not Known. The wound is located on the Medial Abdomen - midline. The wound measures 1.8cm length x 1.5cm width x 0.2cm depth; 2.121cm^2 area  and 0.424cm^3 volume. The wound is limited to skin breakdown. There is no tunneling or undermining noted. There is a small amount of serous drainage noted. The wound margin is flat and intact. There is no granulation within the wound bed. There is a large (67-100%) amount of necrotic tissue within the wound bed including Adherent Slough. The periwound skin appearance exhibited: Scarring, Dry/Scaly. The periwound skin appearance did not exhibit: Callus, Crepitus, Excoriation, Fluctuance, Friable, Induration, Localized Edema, Rash, Maceration, Moist, Atrophie Blanche, Cyanosis, Ecchymosis, Hemosiderin Staining, Mottled, Pallor, Rubor, Erythema. Periwound temperature was noted as No Abnormality. Assessment Active Problems TAMARIA, DUNLEAVY ([id number]) ICD-10 E11.622 - Type 2 diabetes mellitus with other skin ulcer S31.102S - Unspecified open wound of abdominal wall, epigastric region without penetration into peritoneal cavity, sequela E66.09 - Other obesity due to excess calories Plan Wound Cleansing: Wound #8 Medial Abdomen - midline: Clean wound with Normal Saline. Anesthetic: Wound #8 Medial Abdomen - midline: Topical Lidocaine 4% cream applied to wound bed prior to debridement Skin Barriers/Peri-Wound Care: Wound #8 Medial Abdomen - midline: Skin Prep Primary Wound  Dressing: Wound #8 Medial Abdomen - midline: Other: - Sorbact with Hydrogel Secondary Dressing: Wound #8 Medial Abdomen - midline: Boardered Foam Dressing Dressing Change Frequency: Wound #8 Medial Abdomen - midline: Change Dressing Monday, Wednesday, Friday Follow-up Appointments: Wound #8 Medial Abdomen - midline: Return Appointment in 2 weeks. - or as she is able Home Health: Wound #8 Medial Abdomen - midline: Continue Home Health Visits - [facility] Nurse may visit PRN to address patient s wound care needs. FACE TO FACE ENCOUNTER: MEDICARE and MEDICAID PATIENTS: I certify that this patient is  under my care and that I had a face-to-face encounter that meets the physician face-to-face encounter requirements with this patient on this date. The encounter with the patient was in whole or in part for the following MEDICAL CONDITION: (primary reason for Home Healthcare) MEDICAL NECESSITY: I certify, that based on my findings, NURSING services are a medically necessary home health service. HOME BOUND STATUS: I certify that my clinical findings support that this patient is homebound (i.e., Due to illness or injury, pt requires aid of supportive devices such as crutches, cane, wheelchairs, walkers, the use of special transportation or the assistance of another person to leave their place of residence. There is a normal inability to leave the home and doing so requires considerable and taxing effort. Other absences are for medical reasons / religious services and are infrequent or of short duration when for other reasons). If current dressing causes regression in wound condition, may D/C ordered dressing product/s and apply Normal Saline Moist Dressing daily until next Wound Healing Center / Other MD appointment. Notify Wound [name], Lagena O. ([id number]) Healing Center of regression in wound condition at [phone]. Please direct any NON-WOUND related issues/requests for orders to patient's Primary Care Physician I have recommended the change to Sorbact and hydrogel to be applied to the wound with a bordered foam dressing and this is to be changed every other day. I have asked her to come to see as more often as she comes about once a month. Electronic Signature(s) Signed: 07/14/2015 4:50:01 PM By: Evlyn KannerBritto, Irmalee Riemenschneider MD, FACS Previous Signature: 07/14/2015 1:18:47 PM Version By: Evlyn KannerBritto, Sharna Gabrys MD, FACS Entered By: Evlyn KannerBritto, Brylie Sneath on 07/14/2015 16:50:01 [name], Matia O. ([id number]) -------------------------------------------------------------------------------- SuperBill Details Patient Name: [name],  Sheng O. Date of Service: 07/14/2015 Medical Record Number: [id number] Patient Account Number: [id number] Date of Birth/Sex: 02/13/1929 (81 y.o. Female) Treating RN: Huel CoventryWoody, Kim Primary Care Physician: Ronna PolioWalker, [name] Other Clinician: Referring Physician: Ronna PolioWalker, [name] Treating Physician/Extender: Rudene ReBritto, Makai Agostinelli Weeks in Treatment: 10 Diagnosis Coding ICD-10 Codes Code Description E11.622 Type 2 diabetes mellitus with other skin ulcer Unspecified open wound of abdominal wall, epigastric region without penetration into S31.102S peritoneal cavity, sequela E66.09 Other obesity due to excess calories Facility Procedures CPT4 Code: [id number] Description: [id number] - WOUND CARE VISIT-LEV 2 EST PT Modifier: Quantity: 1 Physician Procedures CPT4: Description Modifier Quantity Code [id number] - WC PHYS LEVEL 3 - EST PT 1 ICD-10 Description Diagnosis E11.622 Type 2 diabetes mellitus with other skin ulcer S31.102S Unspecified open wound of abdominal wall, epigastric region without  penetration into peritoneal cavity, sequela E66.09 Other obesity due to excess calories Electronic Signature(s) Signed: 07/14/2015 4:16:33 PM By: Alejandro MullingPinkerton, Debra Signed: 07/14/2015 4:47:37 PM By: Evlyn KannerBritto, Ezequiel Macauley MD, FACS Previous Signature: 07/14/2015 1:19:04 PM Version By: Evlyn KannerBritto, Quinterius Gaida MD, FACS Entered By: Alejandro MullingPinkerton, Debra on 07/14/2015 13:26:44

## 2015-07-15 NOTE — Progress Notes (Signed)
[name] ([id number]) Visit Report for 07/14/2015 Arrival Information Details Patient Name: [name], [name]. Date of Service: 07/14/2015 1:00 PM Medical Record Number: [id number] Patient Account Number: [id number] Date of Birth/Sex: [birth year] (81 y.o. Female) Treating RN: Phillis Haggis Primary Care Physician: Ronna Polio Other Clinician: Referring Physician: Ronna Polio Treating Physician/Extender: Rudene Re in Treatment: 10 Visit Information History Since Last Visit All ordered tests and consults were completed: No Patient Arrived: [name] Added or deleted any medications: No Arrival Time: 13:01 Any new allergies or adverse reactions: No Accompanied By: self Had a fall or experienced change in No Transfer Assistance: None activities of daily living that may affect Patient Identification Verified: Yes risk of falls: Secondary Verification Process Yes Signs or symptoms of abuse/neglect since last No Completed: visito Patient Requires Transmission- No Hospitalized since last visit: No Based Precautions: Pain Present Now: No Patient Has Alerts: Yes Patient Alerts: Patient on Blood Thinner Electronic Signature(s) Signed: 07/14/2015 4:16:33 PM By: Alejandro Mulling Entered By: Alejandro Mulling on 07/14/2015 13:02:30 [name], [name] ([id number]) -------------------------------------------------------------------------------- Clinic Level of Care Assessment Details Patient Name: [name] Date of Service: 07/14/2015 1:00 PM Medical Record Number: [id number] Patient Account Number: [id number] Date of Birth/Sex: [birth year] (81 y.o. Female) Treating RN: Ashok Cordia, Debi Primary Care Physician: Ronna Polio Other Clinician: Referring Physician: Ronna Polio Treating Physician/Extender: Rudene Re in Treatment: 10 Clinic Level of Care Assessment Items TOOL 4 Quantity Score []  - Use when only an EandM is performed on FOLLOW-UP visit  0 ASSESSMENTS - Nursing Assessment / Reassessment []  - Reassessment of Co-morbidities (includes updates in patient status) 0 X - Reassessment of Adherence to Treatment Plan 1 5 ASSESSMENTS - Wound and Skin Assessment / Reassessment X - Simple Wound Assessment / Reassessment - one wound 1 5 []  - Complex Wound Assessment / Reassessment - multiple wounds 0 []  - Dermatologic / Skin Assessment (not related to wound area) 0 ASSESSMENTS - Focused Assessment []  - Circumferential Edema Measurements - multi extremities 0 []  - Nutritional Assessment / Counseling / Intervention 0 []  - Lower Extremity Assessment (monofilament, tuning fork, pulses) 0 []  - Peripheral Arterial Disease Assessment (using hand held doppler) 0 ASSESSMENTS - Ostomy and/or Continence Assessment and Care []  - Incontinence Assessment and Management 0 []  - Ostomy Care Assessment and Management (repouching, etc.) 0 PROCESS - Coordination of Care X - Simple Patient / Family Education for ongoing care 1 15 []  - Complex (extensive) Patient / Family Education for ongoing care 0 X - Staff obtains Chiropractor, Records, Test Results / Process Orders 1 10 []  - Staff telephones HHA, Nursing Homes / Clarify orders / etc 0 []  - Routine Transfer to another Facility (non-emergent condition) 0 [name], [name] ([id number]) []  - Routine Hospital Admission (non-emergent condition) 0 []  - New Admissions / Manufacturing engineer / Ordering NPWT, Apligraf, etc. 0 []  - Emergency Hospital Admission (emergent condition) 0 X - Simple Discharge Coordination 1 10 []  - Complex (extensive) Discharge Coordination 0 PROCESS - Special Needs []  - Pediatric / Minor Patient Management 0 []  - Isolation Patient Management 0 []  - Hearing / Language / Visual special needs 0 []  - Assessment of Community assistance (transportation, D/C planning, etc.) 0 []  - Additional assistance / Altered mentation 0 []  - Support Surface(s) Assessment (bed, cushion, seat, etc.)  0 INTERVENTIONS - Wound Cleansing / Measurement X - Simple Wound Cleansing - one wound 1 5 []  - Complex Wound Cleansing - multiple wounds 0 X - Wound Imaging (photographs - any  number of wounds) 1 5 []  - Wound Tracing (instead of photographs) 0 X - Simple Wound Measurement - one wound 1 5 []  - Complex Wound Measurement - multiple wounds 0 INTERVENTIONS - Wound Dressings X - Small Wound Dressing one or multiple wounds 1 10 []  - Medium Wound Dressing one or multiple wounds 0 []  - Large Wound Dressing one or multiple wounds 0 []  - Application of Medications - topical 0 []  - Application of Medications - injection 0 INTERVENTIONS - Miscellaneous []  - External ear exam 0 [name], [name] ([id number]) []  - Specimen Collection (cultures, biopsies, blood, body fluids, etc.) 0 []  - Specimen(s) / Culture(s) sent or taken to Lab for analysis 0 []  - Patient Transfer (multiple staff / Michiel SitesHoyer Lift / Similar devices) 0 []  - Simple Staple / Suture removal (25 or less) 0 []  - Complex Staple / Suture removal (26 or more) 0 []  - Hypo / Hyperglycemic Management (close monitor of Blood Glucose) 0 []  - Ankle / Brachial Index (ABI) - do not check if billed separately 0 X - Vital Signs 1 5 Has the patient been seen at the hospital within the last three years: Yes Total Score: 75 Level Of Care: New/Established - Level 2 Electronic Signature(s) Signed: 07/14/2015 4:16:33 PM By: Alejandro MullingPinkerton, Debra Entered By: Alejandro MullingPinkerton, Debra on 07/14/2015 13:26:33 [name], [name] ([id number]) -------------------------------------------------------------------------------- Encounter Discharge Information Details Patient Name: [name] SpringLLOYD, [name] Date of Service: 07/14/2015 1:00 PM Medical Record Number: [id number] Patient Account Number: [id number] Date of Birth/Sex: 01/12/1929 (81 y.o. Female) Treating RN: Phillis HaggisPinkerton, Debi Primary Care Physician: Ronna PolioWalker, [name] Other Clinician: Referring Physician: Ronna PolioWalker, [name] Treating  Physician/Extender: Rudene ReBritto, Errol Weeks in Treatment: 10 Encounter Discharge Information Items Discharge Pain Level: 0 Discharge Condition: Stable Ambulatory Status: Ambulatory Discharge Destination: Home Transportation: Private Auto Accompanied By: son Schedule Follow-up Appointment: Yes Medication Reconciliation completed and provided to Patient/Care Yes Jadie Comas: Provided on Clinical Summary of Care: 07/14/2015 Form Type Recipient Paper Patient CL Electronic Signature(s) Signed: 07/14/2015 1:29:41 PM By: Gwenlyn PerkingMoore, Shelia Entered By: Gwenlyn PerkingMoore, Shelia on 07/14/2015 13:29:41 Mula, [name] ([id number]) -------------------------------------------------------------------------------- Lower Extremity Assessment Details Patient Name: [name] SpringLLOYD, [name] Date of Service: 07/14/2015 1:00 PM Medical Record Number: [id number] Patient Account Number: [id number] Date of Birth/Sex: 04/03/1929 (81 y.o. Female) Treating RN: Ashok CordiaPinkerton, Debi Primary Care Physician: Ronna PolioWalker, [name] Other Clinician: Referring Physician: Ronna PolioWalker, [name] Treating Physician/Extender: Rudene ReBritto, Errol Weeks in Treatment: 10 Electronic Signature(s) Signed: 07/14/2015 4:16:33 PM By: Alejandro MullingPinkerton, Debra Entered By: Alejandro MullingPinkerton, Debra on 07/14/2015 13:06:12 Skilling, [name] ([id number]) -------------------------------------------------------------------------------- Multi Wound Chart Details Patient Name: [name] SpringLLOYD, [name] Date of Service: 07/14/2015 1:00 PM Medical Record Number: [id number] Patient Account Number: [id number] Date of Birth/Sex: 09/03/1928 (81 y.o. Female) Treating RN: Phillis HaggisPinkerton, Debi Primary Care Physician: Ronna PolioWalker, [name] Other Clinician: Referring Physician: Ronna PolioWalker, [name] Treating Physician/Extender: Rudene ReBritto, Errol Weeks in Treatment: 10 Vital Signs Height(in): 59 Pulse(bpm): 70 Weight(lbs): 180 Blood Pressure 146/92 (mmHg): Body Mass Index(BMI): 36 Temperature(F): 97.6 Respiratory  Rate 18 (breaths/min): Photos: [8:No Photos] [N/A:N/A] Wound Location: [8:Abdomen - midline - Medial] [N/A:N/A] Wounding Event: [8:Not Known] [N/A:N/A] Primary Etiology: [8:Atypical] [N/A:N/A] Comorbid History: [8:Cataracts, Hypertension, Type II Diabetes] [N/A:N/A] Date Acquired: [8:04/23/2003] [N/A:N/A] Weeks of Treatment: [8:10] [N/A:N/A] Wound Status: [8:Open] [N/A:N/A] Clustered Wound: [8:Yes] [N/A:N/A] Measurements L x W x D 1.8x1.5x0.2 [N/A:N/A] (cm) Area (cm) : [8:2.121] [N/A:N/A] Volume (cm) : [8:0.424] [N/A:N/A] % Reduction in Area: [8:-462.60%] [N/A:N/A] % Reduction in Volume: -465.30% [N/A:N/A] Classification: [8:Full Thickness Without Exposed Support Structures] [N/A:N/A] Exudate Amount: [8:Small] [N/A:N/A] Exudate Type: [8:Serous] [N/A:N/A]  Exudate Color: [8:amber] [N/A:N/A] Wound Margin: [8:Flat and Intact] [N/A:N/A] Granulation Amount: [8:None Present (0%)] [N/A:N/A] Necrotic Amount: [8:Large (67-100%)] [N/A:N/A] Exposed Structures: [8:Fascia: No Fat: No Tendon: No Muscle: No] [N/A:N/A] Joint: No Bone: No Limited to Skin Breakdown Epithelialization: None N/A N/A Periwound Skin Texture: Scarring: Yes N/A N/A Edema: No Excoriation: No Induration: No Callus: No Crepitus: No Fluctuance: No Friable: No Rash: No Periwound Skin Dry/Scaly: Yes N/A N/A Moisture: Maceration: No Moist: No Periwound Skin Color: Atrophie Blanche: No N/A N/A Cyanosis: No Ecchymosis: No Erythema: No Hemosiderin Staining: No Mottled: No Pallor: No Rubor: No Temperature: No Abnormality N/A N/A Tenderness on No N/A N/A Palpation: Wound Preparation: Ulcer Cleansing: N/A N/A Rinsed/Irrigated with Saline Topical Anesthetic Applied: Other: lidocaine 4% Treatment Notes Electronic Signature(s) Signed: 07/14/2015 4:16:33 PM By: Alejandro Mulling Entered By: Alejandro Mulling on 07/14/2015 13:10:12 [name], [name]  ([id number]) -------------------------------------------------------------------------------- Multi-Disciplinary Care Plan Details Patient Name: [name] Date of Service: 07/14/2015 1:00 PM Medical Record Number: [id number] Patient Account Number: [id number] Date of Birth/Sex: 05/08/1929 (81 y.o. Female) Treating RN: Ashok Cordia, Debi Primary Care Physician: Ronna Polio Other Clinician: Referring Physician: Ronna Polio Treating Physician/Extender: Rudene Re in Treatment: 10 Active Inactive Abuse / Safety / Falls / Self Care Management Nursing Diagnoses: Impaired physical mobility Potential for falls Goals: Patient will remain injury free Date Initiated: 05/05/2015 Goal Status: Active Interventions: Assess fall risk on admission and as needed Notes: Wound/Skin Impairment Nursing Diagnoses: Impaired tissue integrity Goals: Ulcer/skin breakdown will have a volume reduction of 30% by week 4 Date Initiated: 05/05/2015 Goal Status: Active Ulcer/skin breakdown will have a volume reduction of 50% by week 8 Date Initiated: 05/05/2015 Goal Status: Active Ulcer/skin breakdown will have a volume reduction of 80% by week 12 Date Initiated: 05/05/2015 Goal Status: Active Ulcer/skin breakdown will heal within 14 weeks Date Initiated: 05/05/2015 Goal Status: Active Interventions: Assess ulceration(s) every visit [name], [name] ([id number]) Provide education on ulcer and skin care Notes: Electronic Signature(s) Signed: 07/14/2015 4:16:33 PM By: Alejandro Mulling Entered By: Alejandro Mulling on 07/14/2015 13:10:03 Schuh, [name] ([id number]) -------------------------------------------------------------------------------- Pain Assessment Details Patient Name: [name] Date of Service: 07/14/2015 1:00 PM Medical Record Number: [id number] Patient Account Number: [id number] Date of Birth/Sex: 1928-12-13 (81 y.o. Female) Treating RN: Phillis Haggis Primary Care  Physician: Ronna Polio Other Clinician: Referring Physician: Ronna Polio Treating Physician/Extender: Rudene Re in Treatment: 10 Active Problems Location of Pain Severity and Description of Pain Patient Has Paino No Site Locations Pain Management and Medication Current Pain Management: Electronic Signature(s) Signed: 07/14/2015 4:16:33 PM By: Alejandro Mulling Entered By: Alejandro Mulling on 07/14/2015 13:02:37 Trillo, [name] ([id number]) -------------------------------------------------------------------------------- Patient/Caregiver Education Details Patient Name: [name] Date of Service: 07/14/2015 1:00 PM Medical Record Number: [id number] Patient Account Number: [id number] Date of Birth/Gender: [birth year] (81 y.o. Female) Treating RN: Phillis Haggis Primary Care Physician: Ronna Polio Other Clinician: Referring Physician: Ronna Polio Treating Physician/Extender: Rudene Re in Treatment: 10 Education Assessment Education Provided To: Patient Education Topics Provided Wound/Skin Impairment: Handouts: Caring for Your Ulcer, Other: wound care as prescribed Methods: Demonstration, Explain/Verbal Responses: State content correctly Electronic Signature(s) Signed: 07/14/2015 4:16:33 PM By: Alejandro Mulling Entered By: Alejandro Mulling on 07/14/2015 13:27:56 Vigilante, [name] ([id number]) -------------------------------------------------------------------------------- Wound Assessment Details Patient Name: [name] Date of Service: 07/14/2015 1:00 PM Medical Record Number: [id number] Patient Account Number: [id number] Date of Birth/Sex: 17-Dec-1928 (81 y.o. Female) Treating RN: Ashok Cordia, Debi Primary Care Physician: Ronna Polio Other Clinician: Referring Physician: Ronna Polio  Treating Physician/Extender: Rudene Re in Treatment: 10 Wound Status Wound Number: 8 Primary Atypical Etiology: Wound  Location: Abdomen - midline - Medial Wound Status: Open Wounding Event: Not Known Comorbid Cataracts, Hypertension, Type II Date Acquired: 04/23/2003 History: Diabetes Weeks Of Treatment: 10 Clustered Wound: Yes Photos Photo Uploaded By: Elliot Gurney, RN, BSN, Kim on 07/14/2015 17:27:58 Wound Measurements Length: (cm) 1.8 Width: (cm) 1.5 Depth: (cm) 0.2 Area: (cm) 2.121 Volume: (cm) 0.424 % Reduction in Area: -462.6% % Reduction in Volume: -465.3% Epithelialization: None Tunneling: No Undermining: No Wound Description Full Thickness Without Exposed Classification: Support Structures Wound Margin: Flat and Intact Exudate Small Amount: Exudate Type: Serous Exudate Color: amber Foul Odor After Cleansing: No Wound Bed Granulation Amount: None Present (0%) Exposed Structure Necrotic Amount: Large (67-100%) Fascia Exposed: No Necrotic Quality: Adherent Slough Fat Layer Exposed: No Senna, [name] ([id number]) Tendon Exposed: No Muscle Exposed: No Joint Exposed: No Bone Exposed: No Limited to Skin Breakdown Periwound Skin Texture Texture Color No Abnormalities Noted: No No Abnormalities Noted: No Callus: No Atrophie Blanche: No Crepitus: No Cyanosis: No Excoriation: No Ecchymosis: No Fluctuance: No Erythema: No Friable: No Hemosiderin Staining: No Induration: No Mottled: No Localized Edema: No Pallor: No Rash: No Rubor: No Scarring: Yes Temperature / Pain Moisture Temperature: No Abnormality No Abnormalities Noted: No Dry / Scaly: Yes Maceration: No Moist: No Wound Preparation Ulcer Cleansing: Rinsed/Irrigated with Saline Topical Anesthetic Applied: Other: lidocaine 4%, Treatment Notes Wound #8 (Medial Abdomen - midline) 1. Cleansed with: Clean wound with Normal Saline 2. Anesthetic Topical Lidocaine 4% cream to wound bed prior to debridement 4. Dressing Applied: Other dressing (specify in notes) 5. Secondary Dressing Applied Bordered Foam  Dressing Notes sorbact with hydrogel Electronic Signature(s) Signed: 07/14/2015 4:16:33 PM By: Alejandro Mulling Entered By: Alejandro Mulling on 07/14/2015 13:08:31 [name], [name] ([id number]) -------------------------------------------------------------------------------- Vitals Details Patient Name: [name] Date of Service: 07/14/2015 1:00 PM Medical Record Number: [id number] Patient Account Number: [id number] Date of Birth/Sex: Jun 30, 1929 (81 y.o. Female) Treating RN: Ashok Cordia, Debi Primary Care Physician: Ronna Polio Other Clinician: Referring Physician: Ronna Polio Treating Physician/Extender: Rudene Re in Treatment: 10 Vital Signs Time Taken: 13:02 Temperature (F): 97.6 Height (in): 59 Pulse (bpm): 70 Weight (lbs): 180 Respiratory Rate (breaths/min): 18 Body Mass Index (BMI): 36.4 Blood Pressure (mmHg): 146/92 Reference Range: 80 - 120 mg / dl Electronic Signature(s) Signed: 07/14/2015 4:16:33 PM By: Alejandro Mulling Entered By: Alejandro Mulling on 07/14/2015 13:05:44

## 2015-07-17 NOTE — Progress Notes (Signed)
[facility] summary report received. Battery status OK. Normal device function. No new symptom episodes, tachy episodes, brady, or pause episodes. 12 AF episodes (0.6%), +Plavix, avg V rate controlled. No additional anticoagulation due to GI bleed in 02/2015. Monthly summary reports and ROV with SK PRN.

## 2015-07-23 ENCOUNTER — Emergency Department
Admission: EM | Admit: 2015-07-23 | Discharge: 2015-07-24 | Disposition: A | Discharge status: Home / Self Care | Payer: Medicare Other | Attending: Emergency Medicine | Admitting: Emergency Medicine

## 2015-07-23 ENCOUNTER — Emergency Department: Payer: Medicare Other

## 2015-07-23 DIAGNOSIS — E119 Type 2 diabetes mellitus without complications: Secondary | ICD-10-CM | POA: Diagnosis not present

## 2015-07-23 DIAGNOSIS — Y9389 Activity, other specified: Secondary | ICD-10-CM | POA: Insufficient documentation

## 2015-07-23 DIAGNOSIS — I639 Cerebral infarction, unspecified: Secondary | ICD-10-CM | POA: Insufficient documentation

## 2015-07-23 DIAGNOSIS — Z79899 Other long term (current) drug therapy: Secondary | ICD-10-CM | POA: Diagnosis not present

## 2015-07-23 DIAGNOSIS — Z794 Long term (current) use of insulin: Secondary | ICD-10-CM | POA: Insufficient documentation

## 2015-07-23 DIAGNOSIS — S8992XA Unspecified injury of left lower leg, initial encounter: Secondary | ICD-10-CM | POA: Insufficient documentation

## 2015-07-23 DIAGNOSIS — Z7902 Long term (current) use of antithrombotics/antiplatelets: Secondary | ICD-10-CM | POA: Diagnosis not present

## 2015-07-23 DIAGNOSIS — S0083XA Contusion of other part of head, initial encounter: Secondary | ICD-10-CM | POA: Insufficient documentation

## 2015-07-23 DIAGNOSIS — M25562 Pain in left knee: Secondary | ICD-10-CM

## 2015-07-23 DIAGNOSIS — Y998 Other external cause status: Secondary | ICD-10-CM | POA: Diagnosis not present

## 2015-07-23 DIAGNOSIS — N183 Chronic kidney disease, stage 3 (moderate): Secondary | ICD-10-CM | POA: Insufficient documentation

## 2015-07-23 DIAGNOSIS — W1839XA Other fall on same level, initial encounter: Secondary | ICD-10-CM | POA: Diagnosis not present

## 2015-07-23 DIAGNOSIS — Y92009 Unspecified place in unspecified non-institutional (private) residence as the place of occurrence of the external cause: Secondary | ICD-10-CM | POA: Insufficient documentation

## 2015-07-23 DIAGNOSIS — N390 Urinary tract infection, site not specified: Secondary | ICD-10-CM | POA: Insufficient documentation

## 2015-07-23 DIAGNOSIS — I129 Hypertensive chronic kidney disease with stage 1 through stage 4 chronic kidney disease, or unspecified chronic kidney disease: Secondary | ICD-10-CM | POA: Diagnosis not present

## 2015-07-23 DIAGNOSIS — W19XXXA Unspecified fall, initial encounter: Secondary | ICD-10-CM

## 2015-07-23 LAB — BASIC METABOLIC PANEL
Anion gap: 6 (ref 5–15)
BUN: 16 mg/dL (ref 6–20)
CALCIUM: 8.8 mg/dL — AB (ref 8.9–10.3)
CO2: 34 mmol/L — AB (ref 22–32)
CREATININE: 0.98 mg/dL (ref 0.44–1.00)
Chloride: 103 mmol/L (ref 101–111)
GFR calc non Af Amer: 52 mL/min — ABNORMAL LOW (ref >60)
Glucose, Bld: 202 mg/dL — ABNORMAL HIGH (ref 65–99)
Potassium: 4 mmol/L (ref 3.5–5.1)
SODIUM: 143 mmol/L (ref 135–145)

## 2015-07-23 LAB — URINALYSIS COMPLETE WITH MICROSCOPIC (ARMC ONLY)
Bilirubin Urine: NEGATIVE
GLUCOSE, UA: NEGATIVE mg/dL
Hgb urine dipstick: NEGATIVE
Nitrite: POSITIVE — AB
Protein, ur: NEGATIVE mg/dL
SPECIFIC GRAVITY, URINE: 1.018 (ref 1.005–1.030)
pH: 6 (ref 5.0–8.0)

## 2015-07-23 LAB — CBC WITH DIFFERENTIAL/PLATELET
BASOS PCT: 0 %
Basophils Absolute: 0 10*3/uL (ref 0–0.1)
EOS ABS: 0.1 10*3/uL (ref 0–0.7)
Eosinophils Relative: 1 %
HEMATOCRIT: 39.5 % (ref 35.0–47.0)
Hemoglobin: 13 g/dL (ref 12.0–16.0)
Lymphocytes Relative: 18 %
Lymphs Abs: 1.2 10*3/uL (ref 1.0–3.6)
MCH: 30.9 pg (ref 26.0–34.0)
MCHC: 32.9 g/dL (ref 32.0–36.0)
MCV: 93.8 fL (ref 80.0–100.0)
MONO ABS: 0.6 10*3/uL (ref 0.2–0.9)
MONOS PCT: 9 %
Neutro Abs: 4.6 10*3/uL (ref 1.4–6.5)
Neutrophils Relative %: 72 %
Platelets: 127 10*3/uL — ABNORMAL LOW (ref 150–440)
RBC: 4.21 MIL/uL (ref 3.80–5.20)
RDW: 15 % — AB (ref 11.5–14.5)
WBC: 6.4 10*3/uL (ref 3.6–11.0)

## 2015-07-23 LAB — TROPONIN I: Troponin I: <0.03 ng/mL (ref <0.031)

## 2015-07-23 LAB — GLUCOSE, CAPILLARY: GLUCOSE-CAPILLARY: 147 mg/dL — AB (ref 65–99)

## 2015-07-23 MED ORDER — DEXTROSE 5 % IV SOLN
1.0000 g | Freq: Once | INTRAVENOUS | Status: AC
  Administered 2015-07-23: 1 g via INTRAVENOUS
  Filled 2015-07-23: qty 10

## 2015-07-23 MED ORDER — CEPHALEXIN 500 MG PO CAPS: 500.0000 mg | ORAL_CAPSULE | Freq: Three times a day (TID) | ORAL | Status: DC

## 2015-07-23 NOTE — ED Notes (Signed)
Patient transported to CT 

## 2015-07-23 NOTE — ED Notes (Signed)
Pt attempted to void again on the bed pan.  Pt was unable.

## 2015-07-23 NOTE — ED Notes (Signed)
Son [name] [id number]

## 2015-07-23 NOTE — ED Provider Notes (Signed)
[facility] Emergency Department Provider Note    ____________________________________________  Time seen: 1015  I have reviewed the triage vital signs and the nursing notes.   HISTORY  Chief Complaint Fall and Knee Pain   History limited by: Not Limited   HPI [name] is a 81 y.o. female who presents to the emergency department today because of primary concern for left knee pain. The patient states she fell roughly 24 hours ago. She states this happened because she had an episode of low blood sugar. She states that happens to her typically in the mornings. She states her blood sugar was in the 40s.She states she has never passed out however because of this. She does not recall hitting her head however her home health nurse noticed a bruise to the back of her head. Patient states she is on Plavix. The patient states she had a hard time sleeping because of left knee pain. She denies any recent fevers, nausea vomiting, chest pain or shortness breath.     Past Medical History  Diagnosis Date  . Anxiety   . Chronic diastolic CHF (congestive heart failure) (HCC)   . Depression   . Diabetes mellitus     Followed by Dr. Tedd Sias at [facility]  . GERD (gastroesophageal reflux disease)   . Hypertension   . Hypothyroidism   . Osteoporosis   . Allergy   . Hyperlipidemia   . RLS (restless legs syndrome)   . Pancreatic disease     pancreatic failure  . Urinary incontinence   . Pancreatitis     Pancreatic resection/ open wound- [facility] 11/04  . Pneumonia     Pneumonia/ Emphysema 05/05  . 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 TIA (transient ischemic attack)     recurrent 12/15  . History of pleural empyema   . Sepsis (HCC)   . Frequent falls   . Arthritis     left knee, [facility]  . CAD (coronary artery disease), native coronary artery      1//15  T mid-LAD  stent OM2  . Parkinson's disease [facility])      Patient Active Problem List   Diagnosis Date Noted  . Right shoulder pain 06/01/2015  . Right arm pain 05/11/2015  . Insomnia 03/17/2015  . GIB (gastrointestinal bleeding) 03/12/2015  . Chronic fatigue 03/10/2015  . Toe pain, left 03/10/2015  . Paroxysmal atrial fibrillation (HCC) 03/02/2015  . CKD (chronic kidney disease), stage III 02/12/2015  . Frequent falls 02/12/2015  . Anxiety 02/12/2015  . H/O resection of pancreas 02/12/2015  . Dysphagia, pharyngoesophageal phase 01/23/2015  . Memory loss 10/23/2014  . CVA (cerebral infarction) 08/07/2014  . Left rotator cuff tear 08/07/2014  . Coronary artery disease 09/12/2013  . Dyspnea 06/20/2013  . Tremor 06/07/2012  . Osteoarthrosis, unspecified whether generalized or localized, involving lower leg 02/27/2012  . Open abdominal wall wound 04/12/2010  . THROMBOCYTOPENIA 07/30/2009  . Hyperlipidemia 10/27/2008  . Hypothyroidism 03/07/2007  . Diabetes type 2, controlled (HCC) 03/07/2007  . DEPRESSION 03/07/2007  . Essential hypertension 03/07/2007  . FAILURE, DIASTOLIC HEART, CHRONIC 03/07/2007  . GERD 03/07/2007    Past Surgical History  Procedure Laterality Date  . Breast lumpectomy      Lumpectomy right breast  . 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 Abdominal hysterectomy      Hysteroscopy/ D & C ([place]) 12/05  .  Removal of pancreas  2004  . Kyphoplasty  2005  . Broken shoulder and orbital bone  2005  . Cholecystectomy  1973  . Thoracentesis    . Vaginal delivery      7  . Cardiac catheterization  08/29/2013    x1 stent @ [facility]  . Cataract surgery      Current Outpatient Rx  Name  Route  Sig  Dispense  Refill  . albuterol (PROVENTIL HFA;VENTOLIN HFA) 108 (90 BASE) MCG/ACT inhaler   Inhalation   Inhale 2 puffs into the lungs every 6 (six) hours as needed for wheezing or shortness of breath.         . ALPRAZolam (XANAX) 1 MG tablet      TAKE 1/2 TABLET BY  MOUTH IN THE MORNING,1/2 TABLET AT LUNCH AND 1 TABLET AT BEDTIME IF NEEDED   60 tablet   3   . amLODipine (NORVASC) 5 MG tablet   Oral   Take 1 tablet (5 mg total) by mouth daily.   30 tablet   5   . budesonide (ENTOCORT EC) 3 MG 24 hr capsule   Oral   Take 6 mg by mouth daily.         . busPIRone (BUSPAR) 10 MG tablet      TAKE ONE (1) TABLET BY MOUTH TWO (2) TIMES DAILY   180 tablet   2   . carbidopa-levodopa (SINEMET IR) 10-100 MG tablet   Oral   Take 1 tablet by mouth daily.         . cetirizine (ZYRTEC) 10 MG tablet   Oral   Take 10 mg by mouth daily.         . clopidogrel (PLAVIX) 75 MG tablet   Oral   Take 1 tablet (75 mg total) by mouth daily.   30 tablet   0   . cyanocobalamin (,VITAMIN B-12,) 1000 MCG/ML injection   Intramuscular   Inject 1 mL (1,000 mcg total) into the muscle every 30 (thirty) days.   30 mL   3   . fluticasone (FLONASE) 50 MCG/ACT nasal spray   Each Nare   Place 2 sprays into both nostrils daily.         . furosemide (LASIX) 40 MG tablet   Oral   Take 1 tablet (40 mg total) by mouth daily.   90 tablet   3   . gentamicin ointment (GARAMYCIN) 0.1 %   Topical   Apply 1 application topically 3 (three) times daily.   30 g   0   . HYDROcodone-acetaminophen (NORCO/VICODIN) 5-325 MG tablet   Oral   Take 1-2 tablets by mouth 3 (three) times daily as needed for moderate pain.   90 tablet   0   . Insulin Glargine (LANTUS) 100 UNIT/ML Solostar Pen   Subcutaneous   Inject 70 Units into the skin daily at 10 pm.   15 mL   11   . insulin lispro (HUMALOG) 100 UNIT/ML KiwkPen   Subcutaneous   Inject 0.08-0.18 mLs (8-18 Units total) into the skin 3 (three) times daily.   15 mL   11   . levothyroxine (SYNTHROID, LEVOTHROID) 75 MCG tablet   Oral   Take 75 mcg by mouth daily.         . metoprolol (TOPROL-XL) 200 MG 24 hr tablet   Oral   Take 100 mg by mouth daily.         . mirtazapine (REMERON) 15 MG tablet  Oral    Take 1 tablet (15 mg total) by mouth at bedtime.   30 tablet   3   . Multiple Vitamins-Minerals (CENTRUM SILVER PO)   Oral   Take 1 tablet by mouth daily.           Marland Kitchen nystatin (MYCOSTATIN/NYSTOP) 100000 UNIT/GM POWD      Apply under breasts twice daily   60 g   6   . omeprazole (PRILOSEC) 20 MG capsule   Oral   Take 1 capsule (20 mg total) by mouth daily.   90 capsule   3   . ondansetron (ZOFRAN) 4 MG tablet   Oral   Take 1 tablet (4 mg total) by mouth every 8 (eight) hours as needed. Patient taking differently: Take 4 mg by mouth every 8 (eight) hours as needed for nausea or vomiting.    90 tablet   11   . Pancrelipase, Lip-Prot-Amyl, (ZENPEP) 15000 UNITS CPEP   Oral   Take 1 capsule by mouth 3 (three) times daily.         . pravastatin (PRAVACHOL) 40 MG tablet   Oral   Take 20 mg by mouth daily.          Marland Kitchen rOPINIRole (REQUIP) 0.25 MG tablet   Oral   Take 0.25 mg by mouth 3 (three) times daily.         Marland Kitchen saccharomyces boulardii (FLORASTOR) 250 MG capsule   Oral   Take 250 mg by mouth daily.         Marland Kitchen tiZANidine (ZANAFLEX) 4 MG tablet   Oral   Take 1 tablet (4 mg total) by mouth 3 (three) times daily.   15 tablet   0   . triamcinolone cream (KENALOG) 0.1 %   Topical   Apply topically 2 (two) times daily. Patient taking differently: Apply 1 application topically 2 (two) times daily.    30 g   0     Allergies Citalopram hydrobromide; Diphenhydramine; Metoclopramide; Paroxetine; and Tape  Family History  Problem Relation Age of Onset  . Heart disease Mother   . Heart attack Mother   . Hypertension Mother   . Heart attack Father     Social History Social History  Substance Use Topics  . Smoking status: Never Smoker   . Smokeless tobacco: Never Used  . Alcohol Use: No    Review of Systems  Constitutional: Negative for fever. Cardiovascular: Negative for chest pain. Respiratory: Negative for shortness of breath. Gastrointestinal:  Negative for abdominal pain, vomiting and diarrhea. Genitourinary: Negative for dysuria. Musculoskeletal: Negative for back pain. Positive for left knee pain Skin: Negative for rash. Positive for bruise to the back of her head. Neurological: Negative for headaches, focal weakness or numbness.   10-point ROS otherwise negative.  ____________________________________________   PHYSICAL EXAM:  VITAL SIGNS: ED Triage Vitals  Enc Vitals Group     BP 07/23/15 0854 146/73 mmHg     Pulse Rate 07/23/15 0854 72     Resp 07/23/15 0854 18     Temp 07/23/15 0854 98 F (36.7 C)     Temp Source 07/23/15 0854 Oral     SpO2 07/23/15 0854 99 %     Weight 07/23/15 0854 185 lb (83.915 kg)     Height 07/23/15 0854 4\' 11"  (1.499 m)     Head Cir --      Peak Flow --      Pain Score 07/23/15 0855 7   Constitutional:  Alert and oriented. Well appearing and in no distress. Eyes: Conjunctivae are normal. PERRL. Normal extraocular movements. ENT   Head: Normocephalic. Ecchymosis to occiput. No hematoma.   Nose: No congestion/rhinnorhea.   Mouth/Throat: Mucous membranes are moist.   Neck: No stridor. Lung tenderness. Hematological/Lymphatic/Immunilogical: No cervical lymphadenopathy. Cardiovascular: Normal rate, regular rhythm.  No murmurs, rubs, or gallops. Respiratory: Normal respiratory effort without tachypnea nor retractions. Breath sounds are clear and equal bilaterally. No wheezes/rales/rhonchi. Gastrointestinal: Soft and nontender. No distention. There is no CVA tenderness. Genitourinary: Deferred Musculoskeletal: Normal range of motion in all extremities. No appreciable joint effusion or swelling to the left knee. Painless range of motion. Painless to palpation. Neurologic:  Normal speech and language. No gross focal neurologic deficits are appreciated.  Skin:  Skin is warm, dry and intact. No rash noted. Psychiatric: Mood and affect are normal. Speech and behavior are normal.  Patient exhibits appropriate insight and judgment.  ____________________________________________    LABS (pertinent positives/negatives)  Labs Reviewed  GLUCOSE, CAPILLARY - Abnormal; Notable for the following:    Glucose-Capillary 147 (*)    All other components within normal limits  CBC WITH DIFFERENTIAL/PLATELET - Abnormal; Notable for the following:    RDW 15.0 (*)    Platelets 127 (*)    All other components within normal limits  BASIC METABOLIC PANEL - Abnormal; Notable for the following:    CO2 34 (*)    Glucose, Bld 202 (*)    Calcium 8.8 (*)    GFR calc non Af Amer 52 (*)    All other components within normal limits  URINALYSIS COMPLETEWITH MICROSCOPIC ([facility] ONLY) - Abnormal; Notable for the following:    Color, Urine YELLOW (*)    APPearance CLEAR (*)    Ketones, ur TRACE (*)    Nitrite POSITIVE (*)    Leukocytes, UA TRACE (*)    Bacteria, UA MANY (*)    Squamous Epithelial / LPF 0-5 (*)    All other components within normal limits  TROPONIN I  CBG MONITORING, ED     ____________________________________________   EKG  I, Phineas Semen, attending physician, personally viewed and interpreted this EKG  EKG Time: 0900 Rate: 71 Rhythm: NSR Axis: normal Intervals: qtc 496 QRS: narrow, q waves V1 ST changes: no st elevation Impression: abnormal ekg ____________________________________________    RADIOLOGY  CT head  IMPRESSION: Atrophy with small vessel chronic ischemic changes of deep cerebral white matter.  Interval RIGHT parietal cortical infarct  No acute intracranial abnormalities.  Left Knee  IMPRESSION: No fracture or dislocation. There is a small joint effusion. There is generalized osteoarthritic change. Chondrocalcinosis is present which may be seen with osteoarthritis but also may be seen with calcium pyrophosphate deposition disease. Bones  diffusely osteoporotic.   ____________________________________________   PROCEDURES  Procedure(s) performed: None  Critical Care performed: No  ____________________________________________   INITIAL IMPRESSION / ASSESSMENT AND PLAN / ED COURSE  Pertinent labs & imaging results that were available during my care of the patient were reviewed by me and considered in my medical decision making (see chart for details).  Patient presented to the emergency department today because of concerns for fall and left knee pain. The left knee x-ray was without concerning findings. I did get an CT scan of her head given that the patient is on blood thinners. This showed an old infarct. I discussed this finding with the patient. Additionally I discussed with neurology on call who recommended outpatient follow-up given the patient has no  deficits. Discussed this and plan with patient. Will give neurology follow-up. In addition blood work was checked which did not show any acute findings.  ____________________________________________   FINAL CLINICAL IMPRESSION(S) / ED DIAGNOSES  Final diagnoses:  UTI (lower urinary tract infection)  Cerebral infarction due to unspecified mechanism  Fall, initial encounter  Left knee pain     Phineas Semen, MD 07/24/15 1245

## 2015-07-23 NOTE — ED Notes (Addendum)
Pt to ED via [facility] with cc of left knee pain. Pt fell yesterday at home, EMS came to house but patient refused transport. Pt unable to ambulate due to knee pain. Pt kneeis very mildly swollen, pt able to help with movement in bed. Pt states that she passed out yesterday morning approx 8AM. Pt Type 2 DM, takes insulin and night. Pt had not eaten at time of syncopal episode yesterday. Pt alert and oriented X4, active, cooperative, pt in NAD. RR even and unlabored, color WNL.

## 2015-07-23 NOTE — Discharge Instructions (Signed)
Please seek medical attention for any high fevers, chest pain, shortness of breath, change in behavior, persistent vomiting, bloody stool or any other new or concerning symptoms.   Urinary Tract Infection A urinary tract infection (UTI) can occur any place along the urinary tract. The tract includes the kidneys, ureters, bladder, and urethra. A type of germ called bacteria often causes a UTI. UTIs are often helped with antibiotic medicine.  HOME CARE   If given, take antibiotics as told by your doctor. Finish them even if you start to feel better.  Drink enough fluids to keep your pee (urine) clear or pale yellow.  Avoid tea, drinks with caffeine, and bubbly (carbonated) drinks.  Pee often. Avoid holding your pee in for a long time.  Pee before and after having sex (intercourse).  Wipe from front to back after you poop (bowel movement) if you are a woman. Use each tissue only once. GET HELP RIGHT AWAY IF:   You have back pain.  You have lower belly (abdominal) pain.  You have chills.  You feel sick to your stomach (nauseous).  You throw up (vomit).  Your burning or discomfort with peeing does not go away.  You have a fever.  Your symptoms are not better in 3 days. MAKE SURE YOU:   Understand these instructions.  Will watch your condition.  Will get help right away if you are not doing well or get worse.   This information is not intended to replace advice given to you by your health care provider. Make sure you discuss any questions you have with your health care provider.   Document Released: 01/25/2008 Document Revised: 08/29/2014 Document Reviewed: 03/08/2012 Elsevier Interactive Patient Education [employer] Stroke Prevention Some medical conditions and behaviors are associated with an increased chance of having a stroke. You may prevent a stroke by making healthy choices and managing medical conditions. HOW CAN I REDUCE MY RISK OF HAVING A STROKE?    Stay physically active. Get at least 30 minutes of activity on most or all days.  Do not smoke. It may also be helpful to avoid exposure to secondhand smoke.  Limit alcohol use. Moderate alcohol use is considered to be:  No more than 2 drinks per day for men.  No more than 1 drink per day for nonpregnant women.  Eat healthy foods. This involves:  Eating 5 or more servings of fruits and vegetables a day.  Making dietary changes that address high blood pressure (hypertension), high cholesterol, diabetes, or obesity.  Manage your cholesterol levels.  Making food choices that are high in fiber and low in saturated fat, trans fat, and cholesterol may control cholesterol levels.  Take any prescribed medicines to control cholesterol as directed by your health care provider.  Manage your diabetes.  Controlling your carbohydrate and sugar intake is recommended to manage diabetes.  Take any prescribed medicines to control diabetes as directed by your health care provider.  Control your hypertension.  Making food choices that are low in salt (sodium), saturated fat, trans fat, and cholesterol is recommended to manage hypertension.  Ask your health care provider if you need treatment to lower your blood pressure. Take any prescribed medicines to control hypertension as directed by your health care provider.  If you are 53-55 years of age, have your blood pressure checked every 3-5 years. If you are 11 years of age or older, have your blood pressure checked every year.  Maintain a healthy weight.  Reducing calorie  intake and making food choices that are low in sodium, saturated fat, trans fat, and cholesterol are recommended to manage weight.  Stop drug abuse.  Avoid taking birth control pills.  Talk to your health care provider about the risks of taking birth control pills if you are over 79 years old, smoke, get migraines, or have ever had a blood clot.  Get evaluated for sleep  disorders (sleep apnea).  Talk to your health care provider about getting a sleep evaluation if you snore a lot or have excessive sleepiness.  Take medicines only as directed by your health care provider.  For some people, aspirin or blood thinners (anticoagulants) are helpful in reducing the risk of forming abnormal blood clots that can lead to stroke. If you have the irregular heart rhythm of atrial fibrillation, you should be on a blood thinner unless there is a good reason you cannot take them.  Understand all your medicine instructions.  Make sure that other conditions (such as anemia or atherosclerosis) are addressed. SEEK IMMEDIATE MEDICAL CARE IF:   You have sudden weakness or numbness of the face, arm, or leg, especially on one side of the body.  Your face or eyelid droops to one side.  You have sudden confusion.  You have trouble speaking (aphasia) or understanding.  You have sudden trouble seeing in one or both eyes.  You have sudden trouble walking.  You have dizziness.  You have a loss of balance or coordination.  You have a sudden, severe headache with no known cause.  You have new chest pain or an irregular heartbeat. Any of these symptoms may represent a serious problem that is an emergency. Do not wait to see if the symptoms will go away. Get medical help at once. Call your local emergency services (911 in [place] Do not drive yourself to the hospital.   This information is not intended to replace advice given to you by your health care provider. Make sure you discuss any questions you have with your health care provider.   Document Released: 09/15/2004 Document Revised: 08/29/2014 Document Reviewed: 02/08/2013 Elsevier Interactive Patient Education [employer]

## 2015-07-23 NOTE — ED Notes (Signed)
Reports received from Cl[place], [place].

## 2015-07-24 ENCOUNTER — Telehealth: Payer: Self-pay | Admitting: Internal Medicine

## 2015-07-24 NOTE — Telephone Encounter (Signed)
Sherri [phone] called from [facility] regarding pt fell yesterday and was taking [facility] and she had an xray. Pt had a UTI and was put on Antibiotic. Sherri wants to request a PT to evaluate and treat via verbal order. Thank You!

## 2015-07-24 NOTE — Telephone Encounter (Signed)
Fine for PT to evaluate and treat

## 2015-07-24 NOTE — Telephone Encounter (Signed)
Notified Sherri at [facility] to give verbal order.

## 2015-07-24 NOTE — Telephone Encounter (Signed)
Please advise 

## 2015-07-28 ENCOUNTER — Other Ambulatory Visit: Payer: Self-pay | Admitting: Internal Medicine

## 2015-07-30 ENCOUNTER — Telehealth: Payer: Self-pay | Admitting: *Deleted

## 2015-07-30 ENCOUNTER — Other Ambulatory Visit: Payer: Self-pay

## 2015-07-30 DIAGNOSIS — IMO0002 Reserved for concepts with insufficient information to code with codable children: Secondary | ICD-10-CM

## 2015-07-30 DIAGNOSIS — M1710 Unilateral primary osteoarthritis, unspecified knee: Secondary | ICD-10-CM

## 2015-07-30 MED ORDER — HYDROCODONE-ACETAMINOPHEN 5-325 MG PO TABS: 1.0000 | ORAL_TABLET | Freq: Three times a day (TID) | ORAL | Status: DC | PRN

## 2015-07-30 NOTE — Telephone Encounter (Signed)
Last refill was on 06/30/15 for #90, patient taking 1-2 pills 3 times a day.  Please advise refill?

## 2015-07-30 NOTE — Telephone Encounter (Signed)
This should be prescribed by neurology. I did not make any dosage changes

## 2015-07-30 NOTE — Telephone Encounter (Signed)
Please advise dosage change

## 2015-07-30 NOTE — Telephone Encounter (Signed)
Patient's daughter requested a medication refill for Vicodin.

## 2015-07-30 NOTE — Telephone Encounter (Signed)
Sent to provider for review

## 2015-08-03 ENCOUNTER — Encounter: Payer: Self-pay | Admitting: Internal Medicine

## 2015-08-03 ENCOUNTER — Ambulatory Visit (INDEPENDENT_AMBULATORY_CARE_PROVIDER_SITE_OTHER): Payer: Medicare Other | Admitting: Internal Medicine

## 2015-08-03 VITALS — BP 149/77 | HR 77 | Temp 98.4°F | Ht 59.0 in | Wt 187.0 lb

## 2015-08-03 DIAGNOSIS — N183 Chronic kidney disease, stage 3 unspecified: Secondary | ICD-10-CM

## 2015-08-03 DIAGNOSIS — I639 Cerebral infarction, unspecified: Secondary | ICD-10-CM | POA: Diagnosis not present

## 2015-08-03 DIAGNOSIS — M179 Osteoarthritis of knee, unspecified: Secondary | ICD-10-CM | POA: Diagnosis not present

## 2015-08-03 DIAGNOSIS — Z794 Long term (current) use of insulin: Secondary | ICD-10-CM

## 2015-08-03 DIAGNOSIS — IMO0002 Reserved for concepts with insufficient information to code with codable children: Secondary | ICD-10-CM

## 2015-08-03 DIAGNOSIS — E1122 Type 2 diabetes mellitus with diabetic chronic kidney disease: Secondary | ICD-10-CM

## 2015-08-03 DIAGNOSIS — M1710 Unilateral primary osteoarthritis, unspecified knee: Secondary | ICD-10-CM

## 2015-08-03 DIAGNOSIS — E538 Deficiency of other specified B group vitamins: Secondary | ICD-10-CM | POA: Diagnosis not present

## 2015-08-03 DIAGNOSIS — M25562 Pain in left knee: Secondary | ICD-10-CM

## 2015-08-03 MED ORDER — ALBUTEROL SULFATE HFA 108 (90 BASE) MCG/ACT IN AERS: 2.0000 | INHALATION_SPRAY | Freq: Four times a day (QID) | RESPIRATORY_TRACT | Status: AC | PRN

## 2015-08-03 MED ORDER — CYANOCOBALAMIN 1000 MCG/ML IJ SOLN
1000.0000 ug | Freq: Once | INTRAMUSCULAR | Status: AC
  Administered 2015-08-03: 1000 ug via INTRAMUSCULAR

## 2015-08-03 NOTE — Assessment & Plan Note (Signed)
BG have generally been well controlled. Will plan to repeat A1c today. Continue current medication.

## 2015-08-03 NOTE — Patient Instructions (Signed)
Labs today.  We will set up evaluation with Dr. Ernest PineHooten.  Follow up 3 months.

## 2015-08-03 NOTE — Progress Notes (Signed)
Pre visit review using our clinic review tool, if applicable. No additional management support is needed unless otherwise documented below in the visit note. 

## 2015-08-03 NOTE — Progress Notes (Signed)
Subjective:    Patient ID: [name], female    DOB: [birth year], 81 y.o.   MRN: [id number]  HPI  81YO female presents for follow up.  Left leg pain - Fell at home, walking out of kitchen and lost balance. BG was low at time in 40s.  Fell onto vinyl. Seen in ED and xray of knee and CT head were normal. Now having severe pain in left knee. Minimal improvement with Vicodin. Pain is described as aching. Worsening with movement or weight bearing. She is using walker. Taking Vicodin prn with some improvement.  DM - BG mostly 80-120. Compliant with medication. A few low readings on occasion, but rare. Son gives her fruit or fruit juice for low BG with improvement.  Wt Readings from Last 3 Encounters:  08/03/15 187 lb (84.823 kg)  07/23/15 185 lb (83.915 kg)  07/07/15 184 lb 8 oz (83.689 kg)   BP Readings from Last 3 Encounters:  08/03/15 149/77  07/23/15 147/91  07/07/15 184/97    Past Medical History  Diagnosis Date  . Anxiety   . Chronic diastolic CHF (congestive heart failure) (HCC)   . Depression   . Diabetes mellitus     Followed by Dr. Tedd Sias at [facility]  . GERD (gastroesophageal reflux disease)   . Hypertension   . Hypothyroidism   . Osteoporosis   . Allergy   . Hyperlipidemia   . RLS (restless legs syndrome)   . Pancreatic disease     pancreatic failure  . Urinary incontinence   . Pancreatitis     Pancreatic resection/ open wound- [facility] 11/04  . Pneumonia     Pneumonia/ Emphysema 05/05  . 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 TIA (transient ischemic attack)     recurrent 12/15  . History of pleural empyema   . Sepsis (HCC)   . Frequent falls   . Arthritis     left knee, [facility]  . CAD (coronary artery disease), native coronary artery      1//15  T mid-LAD  stent OM2  . Parkinson's disease (HCC)    Family History  Problem Relation Age of Onset  . Heart disease Mother   . Heart attack Mother   .  Hypertension Mother   . Heart attack Father    Past Surgical History  Procedure Laterality Date  . Breast lumpectomy      Lumpectomy right breast  . 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 Abdominal hysterectomy      Hysteroscopy/ D & C ([facility]) 12/05  . Removal of pancreas  2004  . Kyphoplasty  2005  . Broken shoulder and orbital bone  2005  . Cholecystectomy  1973  . Thoracentesis    . Vaginal delivery      9  . Cardiac catheterization  08/29/2013    x1 stent @ [facility]  . Cataract surgery     Social History   Social History  . Marital Status: Widowed    Spouse Name: N/A  . Number of Children: 7  . Years of Education: 6   Occupational History  . retired- Veterinary surgeon    Social History Main Topics  . Smoking status: Never Smoker   . Smokeless tobacco: Never Used  . Alcohol Use: No  . Drug Use: No  . Sexual Activity: Not Asked   Other Topics Concern  . None  Social History Narrative   Lives in home alone, has nursing aid, and alert device in [place]. Has 7 children.            Has Kendal HymenBonnie, daughter, as health care POA   Not sure about DNR   Permission to speak to daughter-in-law, Georgian Congela Norman, regarding health care matters.    Review of Systems  Constitutional: Positive for fatigue. Negative for fever, chills, appetite change and unexpected weight change.  Eyes: Negative for visual disturbance.  Respiratory: Negative for shortness of breath.   Cardiovascular: Negative for chest pain and leg swelling.  Gastrointestinal: Negative for nausea, vomiting, abdominal pain, diarrhea and constipation.  Musculoskeletal: Positive for myalgias, joint swelling, arthralgias and gait problem.  Skin: Negative for color change and rash.  Hematological: Negative for adenopathy. Does not bruise/bleed easily.  Psychiatric/Behavioral: Positive for sleep disturbance. Negative for dysphoric mood. The patient is not nervous/anxious.         Objective:    BP 149/77 mmHg  Pulse 77  Temp(Src) 98.4 F (36.9 C) (Oral)  Ht 4\' 11"  (1.499 m)  Wt 187 lb (84.823 kg)  BMI 37.75 kg/m2  SpO2 95%  LMP  (LMP Unknown) Physical Exam  Constitutional: She is oriented to person, place, and time. She appears well-developed and well-nourished. No distress.  HENT:  Head: Normocephalic and atraumatic.  Right Ear: External ear normal.  Left Ear: External ear normal.  Nose: Nose normal.  Mouth/Throat: Oropharynx is clear and moist. No oropharyngeal exudate.  Eyes: Conjunctivae are normal. Pupils are equal, round, and reactive to light. Right eye exhibits no discharge. Left eye exhibits no discharge. No scleral icterus.  Neck: Normal range of motion. Neck supple. No tracheal deviation present. No thyromegaly present.  Cardiovascular: Normal rate, regular rhythm, normal heart sounds and intact distal pulses.  Exam reveals no gallop and no friction rub.   No murmur heard. Pulmonary/Chest: Effort normal and breath sounds normal. No respiratory distress. She has no wheezes. She has no rales. She exhibits no tenderness.  Musculoskeletal: She exhibits no edema.       Left knee: She exhibits decreased range of motion and swelling. She exhibits no deformity. Tenderness (diffuse) found.  Lymphadenopathy:    She has no cervical adenopathy.  Neurological: She is alert and oriented to person, place, and time. No cranial nerve deficit. She exhibits normal muscle tone. Coordination normal.  Skin: Skin is warm and dry. No rash noted. She is not diaphoretic. No erythema. No pallor.  Psychiatric: She has a normal mood and affect. Her behavior is normal. Judgment and thought content normal.          Assessment & Plan:   Problem List Items Addressed This Visit      High   Diabetes type 2, controlled (HCC) - Primary (Chronic)    BG have generally been well controlled. Will plan to repeat A1c today. Continue current medication.      Relevant  Orders   Comprehensive metabolic panel   Hemoglobin A1c   Lipid panel   Microalbumin / creatinine urine ratio     Unprioritized   Osteoarthrosis, unspecified whether generalized or localized, involving lower leg    Worsening pain in right knee secondary to OA, exacerbated by recent fall. Reviewed plain xray from ED which showed no acute changes. Will set up ortho evaluation. Question if repeat steroid injection might be helpful. Continue hydrocodone as needed for severe pain.        Other Visit Diagnoses  Left knee pain        Relevant Orders    Ambulatory referral to Orthopedic Surgery    B12 deficiency        Relevant Medications    cyanocobalamin ((VITAMIN B-12)) injection 1,000 mcg (Completed)        Return in about 3 months (around 11/01/2015) for Recheck.

## 2015-08-03 NOTE — Assessment & Plan Note (Signed)
Worsening pain in right knee secondary to OA, exacerbated by recent fall. Reviewed plain xray from ED which showed no acute changes. Will set up ortho evaluation. Question if repeat steroid injection might be helpful. Continue hydrocodone as needed for severe pain.

## 2015-08-04 ENCOUNTER — Other Ambulatory Visit: Payer: Self-pay | Admitting: *Deleted

## 2015-08-04 ENCOUNTER — Ambulatory Visit: Payer: Self-pay | Admitting: Surgery

## 2015-08-04 DIAGNOSIS — IMO0002 Reserved for concepts with insufficient information to code with codable children: Secondary | ICD-10-CM

## 2015-08-04 DIAGNOSIS — M1710 Unilateral primary osteoarthritis, unspecified knee: Secondary | ICD-10-CM

## 2015-08-04 LAB — LIPID PANEL
CHOL/HDL RATIO: 3
Cholesterol: 176 mg/dL (ref 0–200)
HDL: 53.8 mg/dL (ref >39.00)
LDL Cholesterol: 82 mg/dL (ref 0–99)
NONHDL: 121.96
TRIGLYCERIDES: 199 mg/dL — AB (ref 0.0–149.0)
VLDL: 39.8 mg/dL (ref 0.0–40.0)

## 2015-08-04 LAB — MICROALBUMIN / CREATININE URINE RATIO
CREATININE, U: 133.1 mg/dL
MICROALB/CREAT RATIO: 0.5 mg/g (ref 0.0–30.0)
Microalb, Ur: 0.7 mg/dL (ref 0.0–1.9)

## 2015-08-04 LAB — HEMOGLOBIN A1C: Hgb A1c MFr Bld: 7.3 % — ABNORMAL HIGH (ref 4.6–6.5)

## 2015-08-04 LAB — COMPREHENSIVE METABOLIC PANEL
ALK PHOS: 104 U/L (ref 39–117)
ALT: 14 U/L (ref 0–35)
AST: 16 U/L (ref 0–37)
Albumin: 3.5 g/dL (ref 3.5–5.2)
BILIRUBIN TOTAL: 0.4 mg/dL (ref 0.2–1.2)
BUN: 18 mg/dL (ref 6–23)
CO2: 35 mEq/L — ABNORMAL HIGH (ref 19–32)
Calcium: 9.1 mg/dL (ref 8.4–10.5)
Chloride: 104 mEq/L (ref 96–112)
Creatinine, Ser: 0.94 mg/dL (ref 0.40–1.20)
GFR: 60.29 mL/min (ref >60.00)
GLUCOSE: 184 mg/dL — AB (ref 70–99)
Potassium: 4.6 mEq/L (ref 3.5–5.1)
Sodium: 145 mEq/L (ref 135–145)
TOTAL PROTEIN: 6.3 g/dL (ref 6.0–8.3)

## 2015-08-05 ENCOUNTER — Telehealth: Payer: Self-pay

## 2015-08-05 NOTE — Telephone Encounter (Signed)
Order needed for a Bedside commode.  RX needs to be faxed to Hollie BeachAngela Thompson at [phone] once completed.  Thanks

## 2015-08-05 NOTE — Telephone Encounter (Signed)
Fine to send order. 

## 2015-08-05 NOTE — Telephone Encounter (Signed)
Order/Letter made and signed, faxed to number provided.

## 2015-08-06 ENCOUNTER — Ambulatory Visit: Payer: Self-pay | Admitting: Surgery

## 2015-08-07 ENCOUNTER — Telehealth: Payer: Self-pay | Admitting: *Deleted

## 2015-08-07 NOTE — Telephone Encounter (Signed)
She is followed at the wound center at [facility]. They will need to order.

## 2015-08-07 NOTE — Telephone Encounter (Signed)
[name] was caleld and told to follow up with wound center at [name]

## 2015-08-07 NOTE — Telephone Encounter (Signed)
Please advise and I will call and give VO.

## 2015-08-07 NOTE — Telephone Encounter (Signed)
Sherri from [facility] has requested additional orders for wound care 2x a week

## 2015-08-11 ENCOUNTER — Ambulatory Visit: Payer: Self-pay | Admitting: Surgery

## 2015-08-12 ENCOUNTER — Ambulatory Visit (INDEPENDENT_AMBULATORY_CARE_PROVIDER_SITE_OTHER): Payer: Medicare Other | Admitting: *Deleted

## 2015-08-12 DIAGNOSIS — I638 Other cerebral infarction: Secondary | ICD-10-CM | POA: Diagnosis not present

## 2015-08-12 DIAGNOSIS — I6389 Other cerebral infarction: Secondary | ICD-10-CM

## 2015-08-12 LAB — CUP PACEART REMOTE DEVICE CHECK: Date Time Interrogation Session: 20161221170746

## 2015-08-13 ENCOUNTER — Encounter: Payer: Medicare Other | Attending: Surgery | Admitting: Surgery

## 2015-08-13 DIAGNOSIS — X58XXXS Exposure to other specified factors, sequela: Secondary | ICD-10-CM | POA: Diagnosis not present

## 2015-08-13 DIAGNOSIS — S31102S Unspecified open wound of abdominal wall, epigastric region without penetration into peritoneal cavity, sequela: Secondary | ICD-10-CM | POA: Diagnosis not present

## 2015-08-13 DIAGNOSIS — E11622 Type 2 diabetes mellitus with other skin ulcer: Secondary | ICD-10-CM | POA: Insufficient documentation

## 2015-08-13 DIAGNOSIS — I509 Heart failure, unspecified: Secondary | ICD-10-CM | POA: Insufficient documentation

## 2015-08-13 DIAGNOSIS — E6609 Other obesity due to excess calories: Secondary | ICD-10-CM | POA: Insufficient documentation

## 2015-08-13 NOTE — Progress Notes (Signed)
[facility] Summary Report / Loop Recorder 

## 2015-08-14 NOTE — Progress Notes (Signed)
[name] ([id number]) Visit Report for 08/13/2015 Chief Complaint Document Details Patient Name: [name], [name] 08/13/2015 3:00 Date of Service: PM Medical Record [id number] Number: Patient Account Number: [id number] 1929-08-02 (81 y.o. 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 Treating RN: Phillis Haggis Date of Birth/Sex: Female) Other Clinician: Primary Care Physician: Ronna Polio Treating Evlyn Kanner Referring Physician: Ronna Polio Physician/Extender: Tania Ade in Treatment: 14 Debridement Performed for Wound #8 Medial Abdomen - midline Assessment: Performed By: Physician Evlyn Kanner, MD Debridement: Open Wound/Selective Debridement Selective Description: Pre-procedure Yes Verification/Time Out Taken: Start Time: 15:50 Pain Control: Other : lidoicaine 4% Level: Non-Viable Tissue Total Area Debrided (L x 1.5 (cm) x 1.1 (cm) = 1.65 (cm) W): Tissue and other Non-Viable, Exudate, Subcutaneous material debrided: Instrument: Forceps Bleeding: Minimum Hemostasis Achieved: Pressure End Time: 15:54 Procedural Pain: 0 Post Procedural Pain:  0 Response to Treatment: Procedure was tolerated well Post Debridement Measurements of Total Wound Length: (cm) 1.5 Width: (cm) 1.1 Depth: (cm) 0.2 Volume: (cm) 0.259 Post Procedure Diagnosis Same as Pre-procedure Electronic Signature(s) Signed: 08/13/2015 3:53:20 PM By: Evlyn Kanner MD, FACS Signed: 08/13/2015 5:45:13 PM By: Alejandro Mulling Entered By: Evlyn Kanner on 08/13/2015 15:53:19 [name] ([name], [name] ([id number]) -------------------------------------------------------------------------------- HPI Details Patient Name: [name], [name] 08/13/2015 3:00 Date of Service: PM Medical Record [id number] Number: Patient Account Number: [id number] [birth year] (81 y.o. Treating RN: Phillis Haggis Date of Birth/Sex: Female) Other Clinician: Primary Care Physician: Ronna Polio Treating Evlyn Kanner Referring Physician: Ronna Polio Physician/Extender: Tania Ade in Treatment: 14 History of Present Illness Location: ulceration on the epigastric region Quality: Patient reports No Pain. Severity: Patient states wound (s) are getting better. Duration: Patient has had the wound for < 4 weeks prior to presenting for treatment Context: The wound occurred when the patient started having some profuse bleeding from the wound and was admitted to the hospital and they noticed that the bleeding had stopped. Associated Signs and Symptoms: Wound has minimal bloody drainage HPI Description: Pleasant 81 year old with h/o ex lap for pancreatitis, DM, CHF. returns to clinic for evaluation of her open abdominal wound. She has been applying silver alginate. She is without complaints today. No significant pain. No drainage. No fever or chills. Tolerating a regular diet and having regular bowel movements. 06/02/2015 -- patient has had multiple areas which are now draining fluid and form an eschar. Electronic Signature(s) Signed: 08/13/2015 3:53:34 PM By: Evlyn Kanner  MD, FACS Entered By: Evlyn Kanner on 08/13/2015 15:53:34 [name] ([id number]) -------------------------------------------------------------------------------- Physical Exam Details Patient Name: [name], [name] 08/13/2015 3:00 Date of Service: PM Medical Record [id number] Number: Patient Account Number: [id number] 04-08-1929 (81 y.o. Treating RN: Phillis Haggis Date of Birth/Sex: Female) Other Clinician: Primary Care Physician: Ronna Polio Treating Evlyn Kanner Referring Physician: Ronna Polio Physician/Extender: Weeks in Treatment: 14 Constitutional . Pulse regular. Respirations normal and unlabored. Afebrile. . Eyes Nonicteric. Reactive to  light. Ears, Nose, Mouth, and Throat Lips, teeth, and gums WNL.Marland Kitchen Moist mucosa without lesions. Neck supple and nontender. No palpable supraclavicular or cervical adenopathy. Normal sized without goiter. Respiratory WNL. No retractions.. Cardiovascular Pedal Pulses WNL. No clubbing, cyanosis or edema. Lymphatic No adneopathy. No adenopathy. No adenopathy. Musculoskeletal Adexa without tenderness or enlargement.. Digits and nails w/o clubbing, cyanosis, infection, petechiae, ischemia, or inflammatory conditions.. Integumentary (Hair, Skin) No suspicious lesions. No crepitus or fluctuance. No peri-wound warmth or erythema. No masses.Marland Kitchen Psychiatric Judgement and insight Intact.. No evidence of depression, anxiety, or agitation.. Notes except for a larger area at about the 9:00 position everything else seems to be doing much better and is well healed. Electronic Signature(s) Signed: 08/13/2015 3:54:08 PM By: Evlyn Kanner MD, FACS Entered By: Evlyn Kanner on 08/13/2015 15:54:08 [name], [name] ([id number]) -------------------------------------------------------------------------------- Physician Orders Details Patient Name: [name], [name] 08/13/2015 3:00 Date of Service: PM Medical  Record [id number] Number: Patient Account Number: [id number] 04-29-1929 (81 y.o. Treating RN: Huel Coventry Date of Birth/Sex: Female) Other Clinician: Primary Care Physician: Ronna Polio Treating Evlyn Kanner Referring Physician: Ronna Polio Physician/Extender: Tania Ade in Treatment: 14 Verbal / Phone Orders: Yes Clinician: Huel Coventry Read Back and Verified: Yes Diagnosis Coding ICD-10 Coding Code Description E11.622 Type 2 diabetes mellitus with other skin ulcer Unspecified open wound of abdominal wall, epigastric region without penetration into S31.102S peritoneal cavity, sequela E66.09 Other obesity due to excess calories Wound Cleansing Wound #8 Medial Abdomen - midline o Clean wound with Normal Saline. Anesthetic Wound #8 Medial Abdomen - midline o Topical Lidocaine 4% cream applied to wound bed prior to debridement Skin Barriers/Peri-Wound Care Wound #8 Medial Abdomen - midline o Skin Prep Primary Wound Dressing Wound #8 Medial Abdomen - midline o Other: - Sorbact with Hydrogel Secondary Dressing Wound #8 Medial Abdomen - midline o Boardered Foam Dressing Dressing Change Frequency Wound #8 Medial Abdomen - midline o Change Dressing Monday, Wednesday, Friday Follow-up Appointments Wound #8 Medial Abdomen - midline Sonnenfeld, [name] ([id number]) o Return Appointment in 2 weeks. - or as she is able Home Health Wound #8 Medial Abdomen - midline o Continue Home Health Visits - [facility] o Home Health Nurse may visit PRN to address patientos wound care needs. o FACE TO FACE ENCOUNTER: MEDICARE and MEDICAID PATIENTS: I certify that this patient is under my care and that I had a face-to-face encounter that meets the physician face-to-face encounter requirements with this patient on this date. The encounter with the patient was in whole or in part for the following MEDICAL CONDITION: (primary reason for Home Healthcare) MEDICAL NECESSITY: I  certify, that based on my findings, NURSING services are a medically necessary home health service. HOME BOUND STATUS: I certify that my clinical findings support that this patient is homebound (i.e., Due to illness or injury, pt requires aid of supportive devices such as crutches, cane, wheelchairs, walkers, the use of special transportation or the assistance of another person to leave their place of residence. There is a normal inability to leave the home and doing so requires considerable and taxing effort. Other absences are for medical reasons / religious services and are infrequent or of short duration when for other reasons). o If current dressing causes regression in wound condition, may D/C ordered dressing product/s and apply Normal Saline Moist Dressing daily until next Wound Healing Center / Other MD appointment. Notify Wound Healing Center of regression in wound condition at [phone]. o Please direct any NON-WOUND related issues/requests for orders to  patient's Primary Care Physician Electronic Signature(s) Signed: 08/13/2015 5:05:23 PM By: Evlyn KannerBritto, Jeanluc Wegman MD, FACS Signed: 08/13/2015 6:21:38 PM By: Elliot GurneyWoody, RN, BSN, Kim RN, BSN Entered By: Elliot GurneyWoody, RN, BSN, Kim on 08/13/2015 16:00:21 [name], [name] ([id number]) -------------------------------------------------------------------------------- Problem List Details Patient Name: [name], [name] 08/13/2015 3:00 Date of Service: PM Medical Record [id number] Number: Patient Account Number: [id number] 12/10/1928 (81 y.o. Treating RN: Phillis HaggisPinkerton, Debi Date of Birth/Sex: Female) Other Clinician: Primary Care Physician: Ronna PolioWalker, [name] Treating Evlyn KannerBritto, Avrie Kedzierski Referring Physician: Ronna PolioWalker, [name] Physician/Extender: Tania AdeWeeks in Treatment: 14 Active Problems ICD-10 Encounter Code Description Active Date Diagnosis E11.622 Type 2 diabetes mellitus with other skin ulcer 05/05/2015 Yes S31.102S Unspecified open wound of abdominal  wall, epigastric 05/05/2015 Yes region without penetration into peritoneal cavity, sequela E66.09 Other obesity due to excess calories 05/05/2015 Yes Inactive Problems Resolved Problems Electronic Signature(s) Signed: 08/13/2015 3:53:11 PM By: Evlyn KannerBritto, Felisia Balcom MD, FACS Entered By: Evlyn KannerBritto, Baudelia Schroepfer on 08/13/2015 15:53:11 [name], [name] O. ([id number]) -------------------------------------------------------------------------------- Progress Note Details Patient Name: [name], Jessilynn O. 08/13/2015 3:00 Date of Service: PM Medical Record [id number] Number: Patient Account Number: [id number] 11/18/1928 (81 y.o. 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 Severity: Patient states wound (s) are getting better. Duration: Patient has had the wound for < 4 weeks prior to presenting for treatment Context: The wound occurred when the patient started having some profuse bleeding from the wound and was admitted to the hospital and they noticed that the bleeding had stopped. Associated Signs and Symptoms: Wound has minimal bloody drainage Pleasant 81 year old with h/o ex lap for pancreatitis, DM, CHF. returns to clinic for evaluation of her open abdominal wound. She has been applying silver alginate. She is without complaints today. No significant pain. No drainage. No fever or chills. Tolerating a regular diet and having  regular bowel movements. 06/02/2015 -- patient has had multiple areas which are now draining fluid and form an eschar. Objective Constitutional Pulse regular. Respirations normal and unlabored. Afebrile. Vitals Time Taken: 3:45 PM, Height: 59 in, Weight: 180 lbs, BMI: 36.4, Temperature: 98.0 F, Pulse: 97 bpm, Respiratory Rate: 18 breaths/min, Blood Pressure: 148/89 mmHg. [name], Carren O. ([id number]) Eyes Nonicteric. Reactive to light. Ears, Nose, Mouth, and Throat Lips, teeth, and gums WNL.Marland Kitchen. Moist mucosa without lesions. Neck supple and nontender. No palpable supraclavicular or cervical adenopathy. Normal sized without goiter. Respiratory WNL. No retractions.. Cardiovascular Pedal Pulses WNL. No clubbing, cyanosis or edema. Lymphatic No adneopathy. No adenopathy. No adenopathy. Musculoskeletal Adexa without tenderness or enlargement.. Digits and nails w/o clubbing, cyanosis, infection, petechiae, ischemia, or inflammatory conditions.Marland Kitchen. Psychiatric Judgement and insight Intact.. No evidence of depression, anxiety, or agitation.. General Notes: except for a larger area at about the 9:00 position everything else seems to be doing much better and is well healed. Integumentary (Hair, Skin) No suspicious lesions. No crepitus or fluctuance. No peri-wound warmth or erythema. No masses.. Wound #8 status is Open. Original cause of wound was Not Known. The wound is located on the Medial Abdomen - midline. The wound measures 1.5cm length  x 1.1cm width x 0.2cm depth; 1.296cm^2 area and 0.259cm^3 volume. The wound is limited to skin breakdown. There is a small amount of serous drainage noted. The wound margin is flat and intact. There is no granulation within the wound bed. There is a large (67-100%) amount of necrotic tissue within the wound bed including Adherent Slough. The periwound skin appearance exhibited: Scarring, Dry/Scaly. The periwound skin appearance did not exhibit:  Callus, Crepitus, Excoriation, Fluctuance, Friable, Induration, Localized Edema, Rash, Maceration, Moist, Atrophie Blanche, Cyanosis, Ecchymosis, Hemosiderin Staining, Mottled, Pallor, Rubor, Erythema. Periwound temperature was noted as No Abnormality. Assessment Active Problems ICD-10 MIRAYAH, WREN ([id number]) E11.622 - Type 2 diabetes mellitus with other skin ulcer S31.102S - Unspecified open wound of abdominal wall, epigastric region without penetration into peritoneal cavity, sequela E66.09 - Other obesity due to excess calories Procedures Wound #8 Wound #8 is an Atypical located on the Medial Abdomen - midline . There was a Non-Viable Tissue Open Wound/Selective [phone]) debridement with total area of 1.65 sq cm performed by Evlyn Kanner, MD. with the following instrument(s): Forceps to remove Non-Viable tissue/material including Exudate and Subcutaneous after achieving pain control using Other (lidoicaine 4%). A time out was conducted prior to the start of the procedure. A Minimum amount of bleeding was controlled with Pressure. The procedure was tolerated well with a pain level of 0 throughout and a pain level of 0 following the procedure. Post Debridement Measurements: 1.5cm length x 1.1cm width x 0.2cm depth; 0.259cm^3 volume. Post procedure Diagnosis Wound #8: Same as Pre-Procedure Plan Wound Cleansing: Wound #8 Medial Abdomen - midline: Clean wound with Normal Saline. Anesthetic: Wound #8 Medial Abdomen - midline: Topical Lidocaine 4% cream applied to wound bed prior to debridement Skin Barriers/Peri-Wound Care: Wound #8 Medial Abdomen - midline: Skin Prep Primary Wound Dressing: Wound #8 Medial Abdomen - midline: Other: - Sorbact with Hydrogel Secondary Dressing: Wound #8 Medial Abdomen - midline: Boardered Foam Dressing Dressing Change Frequency: Wound #8 Medial Abdomen - midline: Change Dressing Monday, Wednesday, Friday Follow-up Appointments: Wound #8  Medial Abdomen - midline: Return Appointment in 2 weeks. - or as she is able LETTA, CARGILE ([id number]) Home Health: Wound #8 Medial Abdomen - midline: Continue Home Health Visits - [facility] Nurse may visit PRN to address patient s wound care needs. FACE TO FACE ENCOUNTER: MEDICARE and MEDICAID PATIENTS: I certify that this patient is under my care and that I had a face-to-face encounter that meets the physician face-to-face encounter requirements with this patient on this date. The encounter with the patient was in whole or in part for the following MEDICAL CONDITION: (primary reason for Home Healthcare) MEDICAL NECESSITY: I certify, that based on my findings, NURSING services are a medically necessary home health service. HOME BOUND STATUS: I certify that my clinical findings support that this patient is homebound (i.e., Due to illness or injury, pt requires aid of supportive devices such as crutches, cane, wheelchairs, walkers, the use of special transportation or the assistance of another person to leave their place of residence. There is a normal inability to leave the home and doing so requires considerable and taxing effort. Other absences are for medical reasons / religious services and are infrequent or of short duration when for other reasons). If current dressing causes regression in wound condition, may D/C ordered dressing product/s and apply Normal Saline Moist Dressing daily until next Wound Healing Center / Other MD appointment. Notify Wound Healing Center of regression in wound condition at [phone].  Please direct any NON-WOUND related issues/requests for orders to patient's Primary Care Physician I have recommended the change to Sorbact and hydrogel to be applied to the wound with a bordered foam dressing and this is to be changed every other day. I have asked her to come to see as more often as she comes about once a month. Electronic Signature(s) Signed:  08/13/2015 5:19:19 PM By: Evlyn Kanner MD, FACS Previous Signature: 08/13/2015 3:54:50 PM Version By: Evlyn Kanner MD, FACS Entered By: Evlyn Kanner on 08/13/2015 17:19:19 CHERRIE, FRANCA ([id number]) -------------------------------------------------------------------------------- SuperBill Details Patient Name: [name] Spring Date of Service: 08/13/2015 Medical Record Number: [id number] Patient Account Number: [id number] Date of Birth/Sex: 1929-04-05 (81 y.o. Female) Treating RN: Ashok Cordia, Debi Primary Care Physician: Ronna Polio Other Clinician: Referring Physician: Ronna Polio Treating Physician/Extender: Rudene Re in Treatment: 14 Diagnosis Coding ICD-10 Codes Code Description E11.622 Type 2 diabetes mellitus with other skin ulcer Unspecified open wound of abdominal wall, epigastric region without penetration into S31.102S peritoneal cavity, sequela E66.09 Other obesity due to excess calories Facility Procedures CPT4: Description Modifier Quantity Code [id number] - DEBRIDE WOUND 1ST 20 SQ CM OR < 1 ICD-10 Description Diagnosis E11.622 Type 2 diabetes mellitus with other skin ulcer S31.102S Unspecified open wound of abdominal wall, epigastric region without  penetration into peritoneal cavity, sequela E66.09 Other obesity due to excess calories Physician Procedures CPT4: Description Modifier Quantity Code [id number] - WC PHYS DEBR WO ANESTH 20 SQ CM 1 ICD-10 Description Diagnosis E11.622 Type 2 diabetes mellitus with other skin ulcer S31.102S Unspecified open wound of abdominal wall, epigastric region without  penetration into peritoneal cavity, sequela E66.09 Other obesity due to excess calories Electronic Signature(s) Signed: 08/13/2015 3:55:01 PM By: Evlyn Kanner MD, FACS Entered By: Evlyn Kanner on 08/13/2015 15:55:01

## 2015-08-14 NOTE — Progress Notes (Addendum)
[name], [name] ([id number]) Visit Report for 08/13/2015 Arrival Information Details Patient Name: [name], [name]. Date of Service: 08/13/2015 3:00 PM Medical Record Number: [id number] Patient Account Number: [id number] Date of Birth/Sex: 1929-02-18 (81 y.o. Female) Treating RN: Huel Coventry Primary Care Physician: Ronna Polio Other Clinician: Referring Physician: Ronna Polio Treating Physician/Extender: Rudene Re in Treatment: 14 Visit Information History Since Last Visit Added or deleted any medications: No Patient Arrived: [name] Any new allergies or adverse reactions: No Arrival Time: 15:44 Had a fall or experienced change in No Accompanied By: self activities of daily living that may affect Transfer Assistance: None risk of falls: Patient Identification Verified: Yes Signs or symptoms of abuse/neglect since last No Secondary Verification Process Yes visito Completed: Hospitalized since last visit: No Patient Requires Transmission- No Has Dressing in Place as Prescribed: Yes Based Precautions: Pain Present Now: No Patient Has Alerts: Yes Patient Alerts: Patient on Blood Thinner Electronic Signature(s) Signed: 08/13/2015 6:21:38 PM By: Elliot Gurney, RN, BSN, Kim RN, BSN Entered By: Elliot Gurney, RN, BSN, Kim on 08/13/2015 15:45:10 [name], [name] ([id number]) -------------------------------------------------------------------------------- Complex / Palliative Patient Assessment Details Patient Name: [name]. Date of Service: 08/13/2015 3:00 PM Medical Record Number: [id number] Patient Account Number: [id number] Date of Birth/Sex: 21-Jan-1929 (81 y.o. Female) Treating RN: Huel Coventry Primary Care Physician: Ronna Polio Other Clinician: Referring Physician: Ronna Polio Treating Physician/Extender: Rudene Re in Treatment: 14 Palliative Management Criteria Complex Wound Management Criteria Patient has remarkable or complex co-morbidities  requiring medications or treatments that extend wound healing times. Examples: o Diabetes mellitus with chronic renal failure or end stage renal disease requiring dialysis o Advanced or poorly controlled rheumatoid arthritis o Diabetes mellitus and end stage chronic obstructive pulmonary disease o Active cancer with current chemo- or radiation therapy DM and mesh in abdomen. Care Approach Wound Care Plan: Complex Wound Management Electronic Signature(s) Signed: 08/28/2015 5:07:23 PM By: Evlyn Kanner MD, FACS Signed: 08/28/2015 5:51:31 PM By: Elliot Gurney RN, BSN, Kim RN, BSN Entered By: Elliot Gurney, RN, BSN, Kim on 08/13/2015 18:44:51 [name], [name] ([id number]) -------------------------------------------------------------------------------- Encounter Discharge Information Details Patient Name: [name], [name]. Date of Service: 08/13/2015 3:00 PM Medical Record Number: [id number] Patient Account Number: [id number] Date of Birth/Sex: 29-Oct-1928 (81 y.o. Female) Treating RN: Phillis Haggis Primary Care Physician: Ronna Polio Other Clinician: Referring Physician: Ronna Polio Treating Physician/Extender: Rudene Re in Treatment: 14 Encounter Discharge Information Items Discharge Pain Level: 0 Discharge Condition: Stable Ambulatory Status: Walker Discharge Destination: Home Transportation: Private Auto Accompanied By: self Schedule Follow-up Appointment: Yes Medication Reconciliation completed and provided to Patient/Care Yes Zayla Agar: Provided on Clinical Summary of Care: 08/13/2015 Form Type Recipient Paper Patient CL Electronic Signature(s) Signed: 08/13/2015 6:21:38 PM By: Elliot Gurney RN, BSN, Kim RN, BSN Previous Signature: 08/13/2015 3:57:19 PM Version By: Gwenlyn Perking Entered By: Elliot Gurney RN, BSN, Kim on 08/13/2015 16:01:05 [name], [name] ([id number]) -------------------------------------------------------------------------------- Multi Wound Chart  Details Patient Name: [name] Date of Service: 08/13/2015 3:00 PM Medical Record Number: [id number] Patient Account Number: [id number] Date of Birth/Sex: [birth year] (81 y.o. Female) Treating RN: Huel Coventry Primary Care Physician: Ronna Polio Other Clinician: Referring Physician: Ronna Polio Treating Physician/Extender: Rudene Re in Treatment: 14 Vital Signs Height(in): 59 Pulse(bpm): 97 Weight(lbs): 180 Blood Pressure 148/89 (mmHg): Body Mass Index(BMI): 36 Temperature(F): 98.0 Respiratory Rate 18 (breaths/min): Photos: [8:No Photos] [N/A:N/A] Wound Location: [8:Abdomen - midline - Medial] [N/A:N/A] Wounding Event: [8:Not Known] [N/A:N/A] Primary Etiology: [8:Atypical] [N/A:N/A] Comorbid History: [8:Cataracts, Hypertension, Type II Diabetes] [N/A:N/A] Date Acquired: [8:04/23/2003] [  N/A:N/A] Weeks of Treatment: [8:14] [N/A:N/A] Wound Status: [8:Open] [N/A:N/A] Clustered Wound: [8:Yes] [N/A:N/A] Measurements L x W x D 1.5x1.1x0.2 [N/A:N/A] (cm) Area (cm) : [8:1.296] [N/A:N/A] Volume (cm) : [8:0.259] [N/A:N/A] % Reduction in Area: [8:-243.80%] [N/A:N/A] % Reduction in Volume: -245.30% [N/A:N/A] Classification: [8:Full Thickness Without Exposed Support Structures] [N/A:N/A] Exudate Amount: [8:Small] [N/A:N/A] Exudate Type: [8:Serous] [N/A:N/A] Exudate Color: [8:amber] [N/A:N/A] Wound Margin: [8:Flat and Intact] [N/A:N/A] Granulation Amount: [8:None Present (0%)] [N/A:N/A] Necrotic Amount: [8:Large (67-100%)] [N/A:N/A] Exposed Structures: [8:Fascia: No Fat: No Tendon: No Muscle: No] [N/A:N/A] Joint: No Bone: No Limited to Skin Breakdown Epithelialization: None N/A N/A Periwound Skin Texture: Scarring: Yes N/A N/A Edema: No Excoriation: No Induration: No Callus: No Crepitus: No Fluctuance: No Friable: No Rash: No Periwound Skin Dry/Scaly: Yes N/A N/A Moisture: Maceration: No Moist: No Periwound Skin Color: Atrophie Blanche: No  N/A N/A Cyanosis: No Ecchymosis: No Erythema: No Hemosiderin Staining: No Mottled: No Pallor: No Rubor: No Temperature: No Abnormality N/A N/A Tenderness on No N/A N/A Palpation: Wound Preparation: Ulcer Cleansing: N/A N/A Rinsed/Irrigated with Saline Topical Anesthetic Applied: Other: lidocaine 4% Treatment Notes Electronic Signature(s) Signed: 08/13/2015 6:21:38 PM By: Elliot Gurney, RN, BSN, Kim RN, BSN Entered By: Elliot Gurney, RN, BSN, Kim on 08/13/2015 15:49:50 Stroope, [name] ([id number]) -------------------------------------------------------------------------------- Multi-Disciplinary Care Plan Details Patient Name: [name], [name]. Date of Service: 08/13/2015 3:00 PM Medical Record Number: [id number] Patient Account Number: [id number] Date of Birth/Sex: 10/06/1928 (81 y.o. Female) Treating RN: Huel Coventry Primary Care Physician: Ronna Polio Other Clinician: Referring Physician: Ronna Polio Treating Physician/Extender: Rudene Re in Treatment: 64 Active Inactive Abuse / Safety / Falls / Self Care Management Nursing Diagnoses: Impaired physical mobility Potential for falls Goals: Patient will remain injury free Date Initiated: 05/05/2015 Goal Status: Active Interventions: Assess fall risk on admission and as needed Notes: Wound/Skin Impairment Nursing Diagnoses: Impaired tissue integrity Goals: Ulcer/skin breakdown will have a volume reduction of 30% by week 4 Date Initiated: 05/05/2015 Goal Status: Active Ulcer/skin breakdown will have a volume reduction of 50% by week 8 Date Initiated: 05/05/2015 Goal Status: Active Ulcer/skin breakdown will have a volume reduction of 80% by week 12 Date Initiated: 05/05/2015 Goal Status: Active Ulcer/skin breakdown will heal within 14 weeks Date Initiated: 05/05/2015 Goal Status: Active Interventions: Assess ulceration(s) every visit [name], [name] ([id number]) Provide education on ulcer and skin  care Notes: Electronic Signature(s) Signed: 08/13/2015 6:21:38 PM By: Elliot Gurney, RN, BSN, Kim RN, BSN Entered By: Elliot Gurney, RN, BSN, Kim on 08/13/2015 15:49:10 [name], [name] ([id number]) -------------------------------------------------------------------------------- Patient/Caregiver Education Details Patient Name: [name] Date of Service: 08/13/2015 3:00 PM Medical Record Number: [id number] Patient Account Number: [id number] Date of Birth/Gender: 02-Dec-1928 (81 y.o. Female) Treating RN: Huel Coventry Primary Care Physician: Ronna Polio Other Clinician: Referring Physician: Ronna Polio Treating Physician/Extender: Rudene Re in Treatment: 14 Education Assessment Education Provided To: Patient Education Topics Provided Wound/Skin Impairment: Handouts: Caring for Your Ulcer, Skin Care Do's and Dont's Methods: Demonstration, Explain/Verbal Responses: State content correctly Electronic Signature(s) Signed: 08/13/2015 6:21:38 PM By: Elliot Gurney, RN, BSN, Kim RN, BSN Entered By: Elliot Gurney, RN, BSN, Kim on 08/13/2015 16:01:19 JANIA, STEINKE ([id number]) -------------------------------------------------------------------------------- Wound Assessment Details Patient Name: [name], [name]. Date of Service: 08/13/2015 3:00 PM Medical Record Number: [id number] Patient Account Number: [id number] Date of Birth/Sex: 1929-04-16 (81 y.o. Female) Treating RN: Huel Coventry Primary Care Physician: Ronna Polio Other Clinician: Referring Physician: Ronna Polio Treating Physician/Extender: Rudene Re in Treatment: 14 Wound Status Wound Number: 8 Primary Atypical Etiology: Wound  Location: Abdomen - midline - Medial Wound Status: Open Wounding Event: Not Known Comorbid Cataracts, Hypertension, Type II Date Acquired: 04/23/2003 History: Diabetes Weeks Of Treatment: 14 Clustered Wound: Yes Photos Photo Uploaded By: Elliot GurneyWoody, RN, BSN, Kim on 08/13/2015 18:32:41 Wound  Measurements Length: (cm) 1.5 Width: (cm) 1.1 Depth: (cm) 0.2 Area: (cm) 1.296 Volume: (cm) 0.259 % Reduction in Area: -243.8% % Reduction in Volume: -245.3% Epithelialization: None Wound Description Full Thickness Without Exposed Classification: Support Structures Wound Margin: Flat and Intact Exudate Small Amount: Exudate Type: Serous Exudate Color: amber Foul Odor After Cleansing: No Wound Bed Granulation Amount: None Present (0%) Exposed Structure Necrotic Amount: Large (67-100%) Fascia Exposed: No Necrotic Quality: Adherent Slough Fat Layer Exposed: No Ginsberg, Stephanie O. ([id number]) Tendon Exposed: No Muscle Exposed: No Joint Exposed: No Bone Exposed: No Limited to Skin Breakdown Periwound Skin Texture Texture Color No Abnormalities Noted: No No Abnormalities Noted: No Callus: No Atrophie Blanche: No Crepitus: No Cyanosis: No Excoriation: No Ecchymosis: No Fluctuance: No Erythema: No Friable: No Hemosiderin Staining: No Induration: No Mottled: No Localized Edema: No Pallor: No Rash: No Rubor: No Scarring: Yes Temperature / Pain Moisture Temperature: No Abnormality No Abnormalities Noted: No Dry / Scaly: Yes Maceration: No Moist: No Wound Preparation Ulcer Cleansing: Rinsed/Irrigated with Saline Topical Anesthetic Applied: Other: lidocaine 4%, Treatment Notes Wound #8 (Medial Abdomen - midline) 1. Cleansed with: Clean wound with Normal Saline 2. Anesthetic Topical Lidocaine 4% cream to wound bed prior to debridement 4. Dressing Applied: Other dressing (specify in notes) 5. Secondary Dressing Applied Bordered Foam Dressing Notes sorbact with hydrogel Electronic Signature(s) Signed: 08/13/2015 6:21:38 PM By: Elliot GurneyWoody, RN, BSN, Kim RN, BSN Entered By: Elliot GurneyWoody, RN, BSN, Kim on 08/13/2015 15:47:23 [name], [name] ([id number]) -------------------------------------------------------------------------------- Vitals Details Patient Name: [name] SpringLLOYD,  [name] Date of Service: 08/13/2015 3:00 PM Medical Record Number: [id number] Patient Account Number: [id number] Date of Birth/Sex: 03/12/1929 (81 y.o. Female) Treating RN: Huel CoventryWoody, Kim Primary Care Physician: Ronna PolioWalker, [name] Other Clinician: Referring Physician: Ronna PolioWalker, [name] Treating Physician/Extender: Rudene ReBritto, Errol Weeks in Treatment: 14 Vital Signs Time Taken: 15:45 Temperature (F): 98.0 Height (in): 59 Pulse (bpm): 97 Weight (lbs): 180 Respiratory Rate (breaths/min): 18 Body Mass Index (BMI): 36.4 Blood Pressure (mmHg): 148/89 Reference Range: 80 - 120 mg / dl Electronic Signature(s) Signed: 08/13/2015 6:21:38 PM By: Elliot GurneyWoody, RN, BSN, Kim RN, BSN Entered By: Elliot GurneyWoody, RN, BSN, Kim on 08/13/2015 15:45:37

## 2015-08-17 ENCOUNTER — Other Ambulatory Visit: Payer: Self-pay | Admitting: Internal Medicine

## 2015-08-18 NOTE — Telephone Encounter (Signed)
Please advise 

## 2015-08-20 ENCOUNTER — Telehealth: Payer: Self-pay | Admitting: Internal Medicine

## 2015-08-20 DIAGNOSIS — M1710 Unilateral primary osteoarthritis, unspecified knee: Secondary | ICD-10-CM

## 2015-08-20 DIAGNOSIS — IMO0002 Reserved for concepts with insufficient information to code with codable children: Secondary | ICD-10-CM

## 2015-08-20 MED ORDER — HYDROCODONE-ACETAMINOPHEN 5-325 MG PO TABS: 1.0000 | ORAL_TABLET | Freq: Three times a day (TID) | ORAL | Status: DC | PRN

## 2015-08-20 NOTE — Telephone Encounter (Signed)
Patient last got it refilled on 07/30/2015 for #90 taking 1-2 pills three times daily.  Please advise refill?

## 2015-08-20 NOTE — Telephone Encounter (Signed)
Pt daughter called about needing another refill for HYDROcodone-acetaminophen (NORCO/VICODIN) 5-325 MG tablet. Daughter wants to know if it can be increased to 120 pills? Please call when it's ready. Thank You!

## 2015-08-20 NOTE — Telephone Encounter (Signed)
Please advise 

## 2015-08-20 NOTE — Telephone Encounter (Signed)
Needs to submit a UDS. She should not be taking more than 4 pills daily. We can refill #120

## 2015-08-20 NOTE — Telephone Encounter (Signed)
Fine with me to decrease visits to monthly

## 2015-08-20 NOTE — Telephone Encounter (Signed)
Sherri [phone] called from [facility] regarding can pt be decreased to Monthly nurse visits? Thank you!

## 2015-08-20 NOTE — Telephone Encounter (Signed)
Spoke with Roanna RaiderSherri, gave Verbal order to change to monthly nurse visits.

## 2015-08-20 NOTE — Telephone Encounter (Signed)
Spoke with the daughter.  Advise her that the prescription was increased to 120 pills, but that she should only be taking 4 total pills a day.  Advised her that we need the patient to submit a UDS asap.  She will have her stop by the office to do that in the next week or so.

## 2015-08-27 ENCOUNTER — Other Ambulatory Visit: Payer: Self-pay | Admitting: *Deleted

## 2015-08-27 DIAGNOSIS — R399 Unspecified symptoms and signs involving the genitourinary system: Secondary | ICD-10-CM

## 2015-08-27 LAB — POCT URINALYSIS DIPSTICK
BILIRUBIN UA: NEGATIVE
Glucose, UA: NEGATIVE
Ketones, UA: NEGATIVE
Leukocytes, UA: NEGATIVE
NITRITE UA: NEGATIVE
PH UA: 5
Protein, UA: NEGATIVE
RBC UA: NEGATIVE
SPEC GRAV UA: 1.025
UROBILINOGEN UA: 0.2

## 2015-09-01 ENCOUNTER — Telehealth: Payer: Self-pay | Admitting: Internal Medicine

## 2015-09-01 NOTE — Telephone Encounter (Signed)
[name] was called and needs a prescription written for these supplies. Please advise?

## 2015-09-01 NOTE — Telephone Encounter (Signed)
[name] [phone] calling from [facility] regarding a prescription written for her supplies, it should incontinent supplies, pull ups, under pads and inserts. Fax to [phone]. Thank you!

## 2015-09-01 NOTE — Telephone Encounter (Signed)
Fine to send over RX.

## 2015-09-01 NOTE — Telephone Encounter (Signed)
[name] FanningJulie this needs a written prescription from Dr. Dan HumphreysWalker.

## 2015-09-02 ENCOUNTER — Telehealth: Payer: Self-pay | Admitting: Internal Medicine

## 2015-09-02 DIAGNOSIS — I69351 Hemiplegia and hemiparesis following cerebral infarction affecting right dominant side: Secondary | ICD-10-CM

## 2015-09-02 DIAGNOSIS — M6281 Muscle weakness (generalized): Secondary | ICD-10-CM

## 2015-09-02 DIAGNOSIS — D510 Vitamin B12 deficiency anemia due to intrinsic factor deficiency: Secondary | ICD-10-CM

## 2015-09-02 DIAGNOSIS — T8189XD Other complications of procedures, not elsewhere classified, subsequent encounter: Secondary | ICD-10-CM | POA: Diagnosis not present

## 2015-09-02 NOTE — Telephone Encounter (Signed)
Patient can have this done at her visit, correct?

## 2015-09-02 NOTE — Telephone Encounter (Signed)
That is fine 

## 2015-09-02 NOTE — Telephone Encounter (Signed)
Attempted to call the patient's daughter back, she can have the urine completed at the next visit.  Left message. Thanks

## 2015-09-02 NOTE — Telephone Encounter (Signed)
Pt called about wanting to get her mom urine check due to the medication that she's on. Can she get that done at her visit on 09/10/2015? Call daughter at @ [phone] [name] Lassiter. Thank You!

## 2015-09-04 NOTE — Telephone Encounter (Signed)
Rx signed by MD and faxed

## 2015-09-10 ENCOUNTER — Ambulatory Visit: Payer: Self-pay | Admitting: Surgery

## 2015-09-10 ENCOUNTER — Ambulatory Visit: Payer: Self-pay

## 2015-09-11 ENCOUNTER — Emergency Department: Payer: Medicare Other

## 2015-09-11 ENCOUNTER — Encounter: Payer: Self-pay | Admitting: *Deleted

## 2015-09-11 ENCOUNTER — Ambulatory Visit (INDEPENDENT_AMBULATORY_CARE_PROVIDER_SITE_OTHER): Payer: Medicare Other | Admitting: *Deleted

## 2015-09-11 ENCOUNTER — Inpatient Hospital Stay
Admission: EM | Admit: 2015-09-11 | Discharge: 2015-09-14 | DRG: 638 | Disposition: A | Discharge status: Home Health | Payer: Medicare Other | Attending: Internal Medicine | Admitting: Internal Medicine

## 2015-09-11 ENCOUNTER — Inpatient Hospital Stay: Payer: Medicare Other

## 2015-09-11 DIAGNOSIS — Z79899 Other long term (current) drug therapy: Secondary | ICD-10-CM

## 2015-09-11 DIAGNOSIS — Z8249 Family history of ischemic heart disease and other diseases of the circulatory system: Secondary | ICD-10-CM | POA: Diagnosis not present

## 2015-09-11 DIAGNOSIS — R627 Adult failure to thrive: Secondary | ICD-10-CM

## 2015-09-11 DIAGNOSIS — Z7902 Long term (current) use of antithrombotics/antiplatelets: Secondary | ICD-10-CM | POA: Diagnosis not present

## 2015-09-11 DIAGNOSIS — F329 Major depressive disorder, single episode, unspecified: Secondary | ICD-10-CM | POA: Diagnosis present

## 2015-09-11 DIAGNOSIS — Z794 Long term (current) use of insulin: Secondary | ICD-10-CM | POA: Diagnosis not present

## 2015-09-11 DIAGNOSIS — I638 Other cerebral infarction: Secondary | ICD-10-CM

## 2015-09-11 DIAGNOSIS — I5032 Chronic diastolic (congestive) heart failure: Secondary | ICD-10-CM | POA: Diagnosis present

## 2015-09-11 DIAGNOSIS — D696 Thrombocytopenia, unspecified: Secondary | ICD-10-CM | POA: Diagnosis present

## 2015-09-11 DIAGNOSIS — I110 Hypertensive heart disease with heart failure: Secondary | ICD-10-CM | POA: Diagnosis present

## 2015-09-11 DIAGNOSIS — E039 Hypothyroidism, unspecified: Secondary | ICD-10-CM | POA: Diagnosis present

## 2015-09-11 DIAGNOSIS — R109 Unspecified abdominal pain: Secondary | ICD-10-CM

## 2015-09-11 DIAGNOSIS — K219 Gastro-esophageal reflux disease without esophagitis: Secondary | ICD-10-CM | POA: Diagnosis present

## 2015-09-11 DIAGNOSIS — I2510 Atherosclerotic heart disease of native coronary artery without angina pectoris: Secondary | ICD-10-CM | POA: Diagnosis present

## 2015-09-11 DIAGNOSIS — E162 Hypoglycemia, unspecified: Secondary | ICD-10-CM

## 2015-09-11 DIAGNOSIS — L270 Generalized skin eruption due to drugs and medicaments taken internally: Secondary | ICD-10-CM

## 2015-09-11 DIAGNOSIS — Z8673 Personal history of transient ischemic attack (TIA), and cerebral infarction without residual deficits: Secondary | ICD-10-CM | POA: Diagnosis not present

## 2015-09-11 DIAGNOSIS — R680 Hypothermia, not associated with low environmental temperature: Secondary | ICD-10-CM | POA: Diagnosis present

## 2015-09-11 DIAGNOSIS — A0472 Enterocolitis due to Clostridium difficile, not specified as recurrent: Secondary | ICD-10-CM

## 2015-09-11 DIAGNOSIS — E785 Hyperlipidemia, unspecified: Secondary | ICD-10-CM | POA: Diagnosis present

## 2015-09-11 DIAGNOSIS — G2581 Restless legs syndrome: Secondary | ICD-10-CM | POA: Diagnosis present

## 2015-09-11 DIAGNOSIS — E11649 Type 2 diabetes mellitus with hypoglycemia without coma: Secondary | ICD-10-CM | POA: Diagnosis not present

## 2015-09-11 DIAGNOSIS — Z888 Allergy status to other drugs, medicaments and biological substances status: Secondary | ICD-10-CM

## 2015-09-11 DIAGNOSIS — T373X5A Adverse effect of other antiprotozoal drugs, initial encounter: Secondary | ICD-10-CM | POA: Diagnosis present

## 2015-09-11 DIAGNOSIS — Y92230 Patient room in hospital as the place of occurrence of the external cause: Secondary | ICD-10-CM | POA: Diagnosis present

## 2015-09-11 DIAGNOSIS — Z6841 Body Mass Index (BMI) 40.0 and over, adult: Secondary | ICD-10-CM | POA: Diagnosis not present

## 2015-09-11 DIAGNOSIS — E11622 Type 2 diabetes mellitus with other skin ulcer: Secondary | ICD-10-CM | POA: Diagnosis present

## 2015-09-11 DIAGNOSIS — L98499 Non-pressure chronic ulcer of skin of other sites with unspecified severity: Secondary | ICD-10-CM | POA: Diagnosis present

## 2015-09-11 DIAGNOSIS — M810 Age-related osteoporosis without current pathological fracture: Secondary | ICD-10-CM | POA: Diagnosis present

## 2015-09-11 DIAGNOSIS — A047 Enterocolitis due to Clostridium difficile: Secondary | ICD-10-CM | POA: Diagnosis present

## 2015-09-11 DIAGNOSIS — T68XXXA Hypothermia, initial encounter: Secondary | ICD-10-CM

## 2015-09-11 DIAGNOSIS — R531 Weakness: Secondary | ICD-10-CM

## 2015-09-11 DIAGNOSIS — I6389 Other cerebral infarction: Secondary | ICD-10-CM

## 2015-09-11 LAB — URINALYSIS COMPLETE WITH MICROSCOPIC (ARMC ONLY)
Bilirubin Urine: NEGATIVE
HGB URINE DIPSTICK: NEGATIVE
LEUKOCYTES UA: NEGATIVE
Nitrite: NEGATIVE
PH: 5 (ref 5.0–8.0)
Protein, ur: NEGATIVE mg/dL
Specific Gravity, Urine: 1.026 (ref 1.005–1.030)

## 2015-09-11 LAB — GASTROINTESTINAL PANEL BY PCR, STOOL (REPLACES STOOL CULTURE)
ADENOVIRUS F40/41: NOT DETECTED
Astrovirus: NOT DETECTED
CAMPYLOBACTER SPECIES: NOT DETECTED
CRYPTOSPORIDIUM: NOT DETECTED
CYCLOSPORA CAYETANENSIS: NOT DETECTED
E. coli O157: NOT DETECTED
Entamoeba histolytica: NOT DETECTED
Enteroaggregative E coli (EAEC): NOT DETECTED
Enteropathogenic E coli (EPEC): NOT DETECTED
Enterotoxigenic E coli (ETEC): NOT DETECTED
GIARDIA LAMBLIA: NOT DETECTED
Norovirus GI/GII: NOT DETECTED
PLESIMONAS SHIGELLOIDES: NOT DETECTED
ROTAVIRUS A: NOT DETECTED
SALMONELLA SPECIES: NOT DETECTED
SHIGA LIKE TOXIN PRODUCING E COLI (STEC): NOT DETECTED
Sapovirus (I, II, IV, and V): NOT DETECTED
Shigella/Enteroinvasive E coli (EIEC): NOT DETECTED
VIBRIO SPECIES: NOT DETECTED
Vibrio cholerae: NOT DETECTED
YERSINIA ENTEROCOLITICA: NOT DETECTED

## 2015-09-11 LAB — CBC
HEMATOCRIT: 46.9 % (ref 35.0–47.0)
Hemoglobin: 15 g/dL (ref 12.0–16.0)
MCH: 30.4 pg (ref 26.0–34.0)
MCHC: 31.9 g/dL — ABNORMAL LOW (ref 32.0–36.0)
MCV: 95.2 fL (ref 80.0–100.0)
Platelets: 135 10*3/uL — ABNORMAL LOW (ref 150–440)
RBC: 4.93 MIL/uL (ref 3.80–5.20)
RDW: 14.6 % — AB (ref 11.5–14.5)
WBC: 9.4 10*3/uL (ref 3.6–11.0)

## 2015-09-11 LAB — GLUCOSE, CAPILLARY
GLUCOSE-CAPILLARY: 154 mg/dL — AB (ref 65–99)
GLUCOSE-CAPILLARY: 214 mg/dL — AB (ref 65–99)
GLUCOSE-CAPILLARY: 51 mg/dL — AB (ref 65–99)
GLUCOSE-CAPILLARY: 55 mg/dL — AB (ref 65–99)
GLUCOSE-CAPILLARY: 76 mg/dL (ref 65–99)
Glucose-Capillary: 235 mg/dL — ABNORMAL HIGH (ref 65–99)
Glucose-Capillary: 138 mg/dL — ABNORMAL HIGH (ref 65–99)
Glucose-Capillary: 95 mg/dL (ref 65–99)
Glucose-Capillary: 46 mg/dL — ABNORMAL LOW (ref 65–99)
Glucose-Capillary: 52 mg/dL — ABNORMAL LOW (ref 65–99)

## 2015-09-11 LAB — BASIC METABOLIC PANEL
Anion gap: 6 (ref 5–15)
BUN: 19 mg/dL (ref 6–20)
CHLORIDE: 104 mmol/L (ref 101–111)
CO2: 36 mmol/L — AB (ref 22–32)
Calcium: 9 mg/dL (ref 8.9–10.3)
Creatinine, Ser: 0.91 mg/dL (ref 0.44–1.00)
GFR calc Af Amer: >60 mL/min (ref >60)
GFR calc non Af Amer: 56 mL/min — ABNORMAL LOW (ref >60)
GLUCOSE: 133 mg/dL — AB (ref 65–99)
POTASSIUM: 3.8 mmol/L (ref 3.5–5.1)
SODIUM: 146 mmol/L — AB (ref 135–145)

## 2015-09-11 LAB — TSH: TSH: 2.315 u[IU]/mL (ref 0.350–4.500)

## 2015-09-11 LAB — C DIFFICILE QUICK SCREEN W PCR REFLEX
C Diff antigen: POSITIVE — AB
C Diff toxin: NEGATIVE

## 2015-09-11 LAB — LACTIC ACID, PLASMA
LACTIC ACID, VENOUS: 3 mmol/L — AB (ref 0.5–2.0)
LACTIC ACID, VENOUS: 2.2 mmol/L — AB (ref 0.5–2.0)

## 2015-09-11 LAB — CLOSTRIDIUM DIFFICILE BY PCR: CDIFFPCR: NEGATIVE

## 2015-09-11 MED ORDER — IOHEXOL 240 MG/ML SOLN
25.0000 mL | INTRAMUSCULAR | Status: AC
  Administered 2015-09-11: 25 mL via ORAL

## 2015-09-11 MED ORDER — ALPRAZOLAM 0.5 MG PO TABS
0.5000 mg | ORAL_TABLET | Freq: Three times a day (TID) | ORAL | Status: DC | PRN
  Administered 2015-09-11 – 2015-09-12 (×2): 0.5 mg via ORAL
  Filled 2015-09-11 (×2): qty 1

## 2015-09-11 MED ORDER — ASPIRIN EC 81 MG PO TBEC
81.0000 mg | DELAYED_RELEASE_TABLET | Freq: Every day | ORAL | Status: DC
  Administered 2015-09-11 – 2015-09-14 (×3): 81 mg via ORAL
  Filled 2015-09-11 (×4): qty 1

## 2015-09-11 MED ORDER — BUSPIRONE HCL 10 MG PO TABS
10.0000 mg | ORAL_TABLET | Freq: Two times a day (BID) | ORAL | Status: DC
  Administered 2015-09-11 – 2015-09-14 (×6): 10 mg via ORAL
  Filled 2015-09-11 (×6): qty 1

## 2015-09-11 MED ORDER — CLOPIDOGREL BISULFATE 75 MG PO TABS
75.0000 mg | ORAL_TABLET | Freq: Every day | ORAL | Status: DC
Start: 2015-09-11 — End: 2015-09-14
  Administered 2015-09-12 – 2015-09-14 (×3): 75 mg via ORAL
  Filled 2015-09-11 (×3): qty 1

## 2015-09-11 MED ORDER — SACCHAROMYCES BOULARDII 250 MG PO CAPS
250.0000 mg | ORAL_CAPSULE | Freq: Every day | ORAL | Status: DC
Start: 2015-09-11 — End: 2015-09-14
  Administered 2015-09-12 – 2015-09-14 (×3): 250 mg via ORAL
  Filled 2015-09-11 (×3): qty 1

## 2015-09-11 MED ORDER — PANCRELIPASE (LIP-PROT-AMYL) 12000-38000 UNITS PO CPEP
15000.0000 [IU] | ORAL_CAPSULE | Freq: Three times a day (TID) | ORAL | Status: DC
  Administered 2015-09-12 – 2015-09-14 (×8): 12000 [IU] via ORAL
  Filled 2015-09-11 (×8): qty 1

## 2015-09-11 MED ORDER — LEVOTHYROXINE SODIUM 75 MCG PO TABS: 75.0000 ug | ORAL_TABLET | Freq: Every day | ORAL | Status: DC

## 2015-09-11 MED ORDER — LEVOTHYROXINE SODIUM 75 MCG PO TABS
75.0000 ug | ORAL_TABLET | Freq: Every day | ORAL | Status: DC
  Administered 2015-09-12 – 2015-09-14 (×3): 75 ug via ORAL
  Filled 2015-09-11 (×3): qty 1

## 2015-09-11 MED ORDER — ADULT MULTIVITAMIN W/MINERALS CH
1.0000 | ORAL_TABLET | Freq: Every day | ORAL | Status: DC
  Administered 2015-09-12 – 2015-09-14 (×3): 1 via ORAL
  Filled 2015-09-11 (×3): qty 1

## 2015-09-11 MED ORDER — CARBIDOPA-LEVODOPA 25-100 MG PO TABS
2.0000 | ORAL_TABLET | Freq: Two times a day (BID) | ORAL | Status: DC
  Administered 2015-09-11 – 2015-09-14 (×6): 2 via ORAL
  Filled 2015-09-11 (×6): qty 2

## 2015-09-11 MED ORDER — DEXTROSE 50 % IV SOLN
1.0000 | Freq: Once | INTRAVENOUS | Status: AC
  Administered 2015-09-11: 50 mL via INTRAVENOUS
  Filled 2015-09-11: qty 50

## 2015-09-11 MED ORDER — OXYCODONE HCL 5 MG PO TABS
5.0000 mg | ORAL_TABLET | ORAL | Status: DC | PRN
  Administered 2015-09-12 – 2015-09-13 (×4): 5 mg via ORAL
  Filled 2015-09-11 (×4): qty 1

## 2015-09-11 MED ORDER — ENOXAPARIN SODIUM 40 MG/0.4ML ~~LOC~~ SOLN
40.0000 mg | SUBCUTANEOUS | Status: DC
  Administered 2015-09-11 – 2015-09-13 (×3): 40 mg via SUBCUTANEOUS
  Filled 2015-09-11 (×4): qty 0.4

## 2015-09-11 MED ORDER — METRONIDAZOLE IN NACL 5-0.79 MG/ML-% IV SOLN
500.0000 mg | Freq: Three times a day (TID) | INTRAVENOUS | Status: DC
  Administered 2015-09-11 – 2015-09-12 (×3): 500 mg via INTRAVENOUS
  Filled 2015-09-11 (×5): qty 100

## 2015-09-11 MED ORDER — ACETAMINOPHEN 325 MG PO TABS: 650.0000 mg | ORAL_TABLET | Freq: Four times a day (QID) | ORAL | Status: DC | PRN

## 2015-09-11 MED ORDER — PRAVASTATIN SODIUM 20 MG PO TABS
40.0000 mg | ORAL_TABLET | Freq: Every day | ORAL | Status: DC
  Administered 2015-09-11 – 2015-09-13 (×3): 40 mg via ORAL
  Filled 2015-09-11 (×3): qty 2

## 2015-09-11 MED ORDER — SODIUM CHLORIDE 0.9 % IJ SOLN
3.0000 mL | Freq: Two times a day (BID) | INTRAMUSCULAR | Status: DC
  Administered 2015-09-11 – 2015-09-14 (×4): 3 mL via INTRAVENOUS

## 2015-09-11 MED ORDER — BUDESONIDE 3 MG PO CPEP
9.0000 mg | ORAL_CAPSULE | Freq: Every day | ORAL | Status: DC
  Administered 2015-09-12 – 2015-09-14 (×3): 9 mg via ORAL
  Filled 2015-09-11 (×3): qty 3

## 2015-09-11 MED ORDER — ALBUTEROL SULFATE HFA 108 (90 BASE) MCG/ACT IN AERS: 2.0000 | INHALATION_SPRAY | Freq: Four times a day (QID) | RESPIRATORY_TRACT | Status: DC | PRN

## 2015-09-11 MED ORDER — LORATADINE 10 MG PO TABS
10.0000 mg | ORAL_TABLET | Freq: Every day | ORAL | Status: DC
  Administered 2015-09-12 – 2015-09-14 (×3): 10 mg via ORAL
  Filled 2015-09-11 (×3): qty 1

## 2015-09-11 MED ORDER — ONDANSETRON HCL 4 MG PO TABS
4.0000 mg | ORAL_TABLET | Freq: Four times a day (QID) | ORAL | Status: DC | PRN
Start: 2015-09-11 — End: 2015-09-14
  Administered 2015-09-14: 4 mg via ORAL
  Filled 2015-09-11: qty 1

## 2015-09-11 MED ORDER — METOPROLOL SUCCINATE ER 50 MG PO TB24
200.0000 mg | ORAL_TABLET | Freq: Every day | ORAL | Status: DC
  Administered 2015-09-13 – 2015-09-14 (×2): 200 mg via ORAL
  Filled 2015-09-11 (×2): qty 4

## 2015-09-11 MED ORDER — ACETAMINOPHEN 650 MG RE SUPP: 650.0000 mg | Freq: Four times a day (QID) | RECTAL | Status: DC | PRN

## 2015-09-11 MED ORDER — HYDROCODONE-ACETAMINOPHEN 5-325 MG PO TABS: 1.0000 | ORAL_TABLET | Freq: Three times a day (TID) | ORAL | Status: DC | PRN

## 2015-09-11 MED ORDER — MORPHINE SULFATE (PF) 2 MG/ML IV SOLN
2.0000 mg | INTRAVENOUS | Status: DC | PRN
  Administered 2015-09-11 – 2015-09-12 (×3): 2 mg via INTRAVENOUS
  Filled 2015-09-11 (×3): qty 1

## 2015-09-11 MED ORDER — ONDANSETRON HCL 4 MG/2ML IJ SOLN
4.0000 mg | Freq: Four times a day (QID) | INTRAMUSCULAR | Status: DC | PRN
  Administered 2015-09-11: 4 mg via INTRAVENOUS
  Filled 2015-09-11: qty 2

## 2015-09-11 MED ORDER — IOHEXOL 300 MG/ML  SOLN
100.0000 mL | Freq: Once | INTRAMUSCULAR | Status: AC | PRN
  Administered 2015-09-11: 100 mL via INTRAVENOUS

## 2015-09-11 MED ORDER — FLUTICASONE PROPIONATE 50 MCG/ACT NA SUSP: 2.0000 | Freq: Every day | NASAL | Status: DC | PRN

## 2015-09-11 MED ORDER — KCL IN DEXTROSE-NACL 20-5-0.45 MEQ/L-%-% IV SOLN
INTRAVENOUS | Status: DC
  Administered 2015-09-11 – 2015-09-13 (×2): via INTRAVENOUS
  Filled 2015-09-11 (×3): qty 1000

## 2015-09-11 MED ORDER — POLYETHYLENE GLYCOL 3350 17 G PO PACK: 17.0000 g | PACK | Freq: Every day | ORAL | Status: DC | PRN

## 2015-09-11 MED ORDER — ALBUTEROL SULFATE (2.5 MG/3ML) 0.083% IN NEBU: 2.5000 mg | INHALATION_SOLUTION | RESPIRATORY_TRACT | Status: DC | PRN

## 2015-09-11 MED ORDER — SODIUM CHLORIDE 0.9 % IV BOLUS (SEPSIS)
1000.0000 mL | Freq: Once | INTRAVENOUS | Status: AC
  Administered 2015-09-11: 1000 mL via INTRAVENOUS

## 2015-09-11 MED ORDER — PRAVASTATIN SODIUM 20 MG PO TABS: 40.0000 mg | ORAL_TABLET | Freq: Every day | ORAL | Status: DC

## 2015-09-11 NOTE — Progress Notes (Signed)
[facility] Summary Report / Loop Recorder 

## 2015-09-11 NOTE — Progress Notes (Signed)
FSBS every hour til midnight then reevaluate per Dr Anne Hahn

## 2015-09-11 NOTE — ED Notes (Signed)
CT contrast drinks have been placed at pts bedside and per CT orders pt is supposed to take drinks to floor and drink while upstairs.

## 2015-09-11 NOTE — H&P (Signed)
[facility] - [place] at [facility]   PATIENT NAME: [name]    MR#:  [id number]  DATE OF BIRTH:  [birth year]  DATE OF ADMISSION:  09/11/2015  PRIMARY CARE PHYSICIAN: Wynona Dove, MD   REQUESTING/REFERRING PHYSICIAN: Dr. Fanny Bien  CHIEF COMPLAINT:   Chief Complaint  Patient presents with  . Hypoglycemia    HISTORY OF PRESENT ILLNESS:  [name]  is a 82 y.o. female with a known history of hypertension, diabetes, CAD with recurrent hypoglycemia presents to the emergency room after EMS was called finding patient to have altered mental status. Patient's blood glucose levels were found to be low at 33 was given D50. Also she had peanut butter and crackers along with ice cream sandwich and pouring juice. Her altered mental status resolved. On arrival to emergency room blood glucose level was again low at 53 and she was given and another D50 amp. Presently patient's main concern is that she has had diarrhea which is watery 5-6 episodes daily for 4 days. Foul-smelling. No blood. Diffuse abdominal pain with nausea but without  Vomiting. Patient used antibiotics 2 weeks back for a urinary tract infection. Here in the emergency room she has been found to have temperature 92.7. Rectal. Patient had pancreatic surgery 12 years back and has had a small chronic anterior abdominal wall ulcer.  PAST MEDICAL HISTORY:   Past Medical History  Diagnosis Date  . Anxiety   . Chronic diastolic CHF (congestive heart failure) (HCC)   . Depression   . Diabetes mellitus     Followed by Dr. Tedd Sias at [facility]  . GERD (gastroesophageal reflux disease)   . Hypertension   . Hypothyroidism   . Osteoporosis   . Allergy   . Hyperlipidemia   . RLS (restless legs syndrome)   . Pancreatic disease     pancreatic failure  . Urinary incontinence   . Pancreatitis     Pancreatic resection/ open wound- [facility] 11/04  . Pneumonia     Pneumonia/ Emphysema 05/05  . Diplopia /TIA    Diplopia- Third nerve palsey  ? 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 TIA (transient ischemic attack)     recurrent 12/15  . History of pleural empyema   . Sepsis (HCC)   . Frequent falls   . Arthritis     left knee, [facility]  . CAD (coronary artery disease), native coronary artery      1//15  T mid-LAD  stent OM2  . Parkinson's disease (HCC)     PAST SURGICAL HISTORY:   Past Surgical History  Procedure Laterality Date  . Breast lumpectomy      Lumpectomy right breast  . 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 Abdominal hysterectomy      Hysteroscopy/ D & C ([facility]) 12/05  . Removal of pancreas  2004  . Kyphoplasty  2005  . Broken shoulder and orbital bone  2005  . Cholecystectomy  1973  . Thoracentesis    . Vaginal delivery      7  . Cardiac catheterization  08/29/2013    x1 stent @ [facility]  . Cataract surgery      SOCIAL HISTORY:   Social History  Substance Use Topics  . Smoking status: Never Smoker   . Smokeless tobacco: Never Used  . Alcohol Use: No    FAMILY HISTORY:   Family History  Problem Relation Age of Onset  . Heart disease Mother   .  Heart attack Mother   . Hypertension Mother   . Heart attack Father     DRUG ALLERGIES:   Allergies  Allergen Reactions  . Citalopram Hydrobromide Nausea And Vomiting and Other (See Comments)    Reaction:  Dizziness and dry mouth   . Diphenhydramine Other (See Comments)    Reaction:  Unknown   . Metoclopramide Other (See Comments)    Reaction:  Unknown   . Paroxetine Other (See Comments)    Reaction:  Hallucinations   . Tape Rash    REVIEW OF SYSTEMS:   Review of Systems  Constitutional: Positive for chills and malaise/fatigue. Negative for fever and weight loss.  HENT: Negative for hearing loss and nosebleeds.   Eyes: Negative for blurred vision, double vision and pain.  Respiratory: Negative for cough, hemoptysis, sputum production, shortness of breath and  wheezing.   Cardiovascular: Negative for chest pain, palpitations, orthopnea and leg swelling.  Gastrointestinal: Positive for nausea, abdominal pain and diarrhea. Negative for vomiting and constipation.  Genitourinary: Negative for dysuria and hematuria.  Musculoskeletal: Negative for myalgias, back pain and falls.  Skin: Negative for rash.  Neurological: Positive for weakness. Negative for dizziness, tremors, sensory change, speech change, focal weakness, seizures and headaches.  Endo/Heme/Allergies: Does not bruise/bleed easily.  Psychiatric/Behavioral: Negative for depression and memory loss. The patient is not nervous/anxious.     MEDICATIONS AT HOME:   Prior to Admission medications   Medication Sig Start Date End Date Taking? Authorizing Provider  ALPRAZolam Prudy Feeler) 1 MG tablet Take 0.5-1 mg by mouth 3 (three) times daily as needed for anxiety. Pt take one-half tablet in the morning, one-half tablet at lunch, and one tablet at bedtime if needed for anxiety.   Yes Historical Provider, MD  amLODipine (NORVASC) 5 MG tablet Take 1 tablet (5 mg total) by mouth daily. 07/07/15  Yes Iran Ouch, MD  busPIRone (BUSPAR) 10 MG tablet Take 10 mg by mouth 2 (two) times daily.   Yes Historical Provider, MD  carbidopa-levodopa (SINEMET IR) 25-100 MG tablet Take 2 tablets by mouth 2 (two) times daily.   Yes Historical Provider, MD  HYDROcodone-acetaminophen (NORCO/VICODIN) 5-325 MG tablet Take 1-2 tablets by mouth 3 (three) times daily as needed for moderate pain. 08/20/15  Yes Shelia Media, MD  insulin glargine (LANTUS) 100 UNIT/ML injection Inject 70 Units into the skin at bedtime.   Yes Historical Provider, MD  insulin lispro (HUMALOG) 100 UNIT/ML injection Inject 8-18 Units into the skin 3 (three) times daily. Pt uses per sliding scale.   Yes Historical Provider, MD  ondansetron (ZOFRAN) 4 MG tablet Take 4 mg by mouth every 8 (eight) hours as needed for nausea or vomiting.   Yes  Historical Provider, MD  Pancrelipase, Lip-Prot-Amyl, (ZENPEP) 15000 units CPEP Take 15,000 Units by mouth 3 (three) times daily with meals.   Yes Historical Provider, MD  albuterol (PROVENTIL HFA;VENTOLIN HFA) 108 (90 BASE) MCG/ACT inhaler Inhale 2 puffs into the lungs every 6 (six) hours as needed for wheezing or shortness of breath. 08/03/15   Shelia Media, MD  budesonide (ENTOCORT EC) 3 MG 24 hr capsule Take 6 mg by mouth daily.    Historical Provider, MD  cephALEXin (KEFLEX) 500 MG capsule Take 1 capsule (500 mg total) by mouth 3 (three) times daily. Patient not taking: Reported on 09/11/2015 07/23/15   Phineas Semen, MD  cetirizine (ZYRTEC) 10 MG tablet Take 10 mg by mouth daily.    Historical Provider, MD  clopidogrel (PLAVIX)  75 MG tablet Take 1 tablet (75 mg total) by mouth daily. 03/14/15   Alford Highland, MD  cyanocobalamin (,VITAMIN B-12,) 1000 MCG/ML injection Inject 1 mL (1,000 mcg total) into the muscle every 30 (thirty) days. 06/01/15   Shelia Media, MD  fluticasone (FLONASE) 50 MCG/ACT nasal spray Place 2 sprays into both nostrils daily.    Historical Provider, MD  furosemide (LASIX) 40 MG tablet Take 1 tablet (40 mg total) by mouth daily. 12/02/14   Iran Ouch, MD  gentamicin ointment (GARAMYCIN) 0.1 % Apply 1 application topically 3 (three) times daily. 02/03/14   Shelia Media, MD  levothyroxine (SYNTHROID, LEVOTHROID) 75 MCG tablet Take 75 mcg by mouth daily.    Historical Provider, MD  metoprolol (TOPROL-XL) 200 MG 24 hr tablet Take 100 mg by mouth daily.    Historical Provider, MD  mirtazapine (REMERON) 15 MG tablet Take 1 tablet (15 mg total) by mouth at bedtime. 03/17/15   Shelia Media, MD  Multiple Vitamins-Minerals (CENTRUM SILVER PO) Take 1 tablet by mouth daily.      Historical Provider, MD  nystatin (MYCOSTATIN/NYSTOP) 100000 UNIT/GM POWD Apply under breasts twice daily 04/07/15   Shelia Media, MD  omeprazole (PRILOSEC) 20 MG capsule Take 1  capsule (20 mg total) by mouth daily. 06/01/15   Shelia Media, MD  pravastatin (PRAVACHOL) 40 MG tablet Take 20 mg by mouth daily.     Historical Provider, MD  rOPINIRole (REQUIP) 0.25 MG tablet Take 0.25 mg by mouth 3 (three) times daily.    Historical Provider, MD  saccharomyces boulardii (FLORASTOR) 250 MG capsule Take 250 mg by mouth daily.    Historical Provider, MD  tiZANidine (ZANAFLEX) 4 MG tablet Take 1 tablet (4 mg total) by mouth 3 (three) times daily. 05/17/15 05/16/16  Chinita Pester, FNP  triamcinolone cream (KENALOG) 0.1 % Apply topically 2 (two) times daily. Patient taking differently: Apply 1 application topically 2 (two) times daily.  10/11/12   Shelia Media, MD      VITAL SIGNS:  Blood pressure 127/71, pulse 64, temperature 95.7 F (35.4 C), temperature source Rectal, resp. rate 20, height  (1.499 m), weight 90.861 kg (200 lb 5 oz), SpO2 95 %.  PHYSICAL EXAMINATION:  Physical Exam  GENERAL:  82 y.o.-year-old patient lying in the bed with no acute distress. Morbidly obese EYES: Pupils equal, round, reactive to light and accommodation. No scleral icterus. Extraocular muscles intact.  HEENT: Head atraumatic, normocephalic. Oropharynx and nasopharynx clear. No oropharyngeal erythema, moist oral mucosa  NECK:  Supple, no jugular venous distention. No thyroid enlargement, no tenderness.  LUNGS: Normal breath sounds bilaterally, no wheezing, rales, rhonchi. No use of accessory muscles of respiration.  CARDIOVASCULAR: S1, S2 normal. No murmurs, rubs, or gallops.  ABDOMEN: Soft, nondistended. Bowel sounds present. No organomegaly or mass. Tenderness diffusely without any rigidity or guarding EXTREMITIES: No pedal edema, cyanosis, or clubbing. + 2 pedal & radial pulses b/l.   NEUROLOGIC: Cranial nerves II through XII are intact. No focal Motor or sensory deficits appreciated b/l PSYCHIATRIC: The patient is alert and oriented x 3. Good affect.  SKIN: Chronic ulcer  anterior abdominal wall 2 x 1 cm. No discharge.  LABORATORY PANEL:   CBC  Recent Labs Lab 09/11/15 1237  WBC 9.4  HGB 15.0  HCT 46.9  PLT 135*   ------------------------------------------------------------------------------------------------------------------  Chemistries   Recent Labs Lab 09/11/15 1237  NA 146*  K 3.8  CL 104  CO2 36*  GLUCOSE 133*  BUN 19  CREATININE 0.91  CALCIUM 9.0   ------------------------------------------------------------------------------------------------------------------  Cardiac Enzymes No results for input(s): TROPONINI in the last 168 hours. ------------------------------------------------------------------------------------------------------------------  RADIOLOGY:  Dg Chest Port 1 View  09/11/2015  CLINICAL DATA:  Altered mental status/ hypoglycemia EXAM: PORTABLE CHEST 1 VIEW COMPARISON:  Jan 12, 2014 FINDINGS: No edema or consolidation. Heart is slightly enlarged with pulmonary vascularity within normal limits. No adenopathy. No bone lesions. There is degenerative change in each shoulder. IMPRESSION: No edema or consolidation. Electronically Signed   By: Bretta Bang III M.D.   On: 09/11/2015 13:06     IMPRESSION AND PLAN:   * Diarrhea With ongoing hypothermia, abdominal pain and hypoglycemia with recent antibiotic use high suspicion for C. difficile. We will send C. difficile stool studies and stool cultures. Start Flagyl empirically. Place on Bair hugger for hypothermia. Check CT scan of the abdomen and pelvis  * Hypoglycemia with insulin-dependent diabetes mellitus Patient has had recurrent problems with hypoglycemia and could have been worsened by her diarrhea. Her Lantus dose has been slowly decreased from 75 units daily to 60 units daily by her PCP. At this point patient's Lantus will be held for today. Start her on D5 continuous drip due to drop in glucose levels in spite of 2 Amps of D50. Can stop D5 when blood  sugars are improved and can be started on Lantus at 45 units daily.  * Hypertension Continue home medications  * CAD is stable  * DVT prophylaxis with Lovenox   All the records are reviewed and case discussed with ED provider. Management plans discussed with the patient, family and they are in agreement.  CODE STATUS: FULL CODE Patient mentions that her healthcare power of attorney wanted her to be DO NOT RESUSCITATE. The patient states that she would like resuscitation and ventilatory support if needed temporarily.   TOTAL CC TIME TAKING CARE OF THIS PATIENT: 45 minutes.    Milagros Loll R M.D on 09/11/2015 at 2:30 PM  Between 7am to 6pm - Pager - [phone]  After 6pm go to [email or web address] - password [email or web address]  [facility]  Office  [phone]  CC: Primary care physician; Wynona Dove, MD   Note: This dictation was prepared with Dragon dictation along with smaller phrase technology. Any transcriptional errors that result from this process are unintentional.

## 2015-09-11 NOTE — ED Notes (Signed)
Pt arrived to ED via EMS after home health nurse found pt in bed with AMS. Pt reports knowing that BG was low but being too weak to ambulate to get orange juice. Pt was given 4 glasses of orange juice, peanut butter crackers and an ice cream sandwich per EMS and blood glucose rose to 101. Pt is now alert and oriented x 4 and reports she has been having increasing difficulty maintaining blood sugars over the past few weeks.

## 2015-09-11 NOTE — ED Notes (Signed)
Admitting MD at bedside.

## 2015-09-11 NOTE — ED Notes (Signed)
Pt reports no symptoms at this time other than weakness and feeling "cold." Unable to get an oral or axillary temp. On pt upon arrival due to pt being cold and clammy.

## 2015-09-11 NOTE — ED Provider Notes (Signed)
[facility] Emergency Department Provider Note  ____________________________________________  Time seen: Approximately 1:34 PM  I have reviewed the triage vital signs and the nursing notes.   HISTORY  Chief Complaint Hypoglycemia    HPI FRANKYE SCHWEGEL is a 82 y.o. female transfer evaluation of confusion, sweatiness and low blood sugar. Patient reported to have low blood sugar with EMS, given an amp of D50 with improvement to 56 on arrival.  Patient reports she feels cold and chilled. Having loose stools and diarrhea last couple of days and feels "dehydrated". She's been using her insulin as normal. Denies being in pain, this does report some cramping with diarrhea. No chest pain trouble breathing. Denies numbness tingling weakness, confusion and slurred speech.  Crampy mild to moderate abdominal discomfort. Recently on antibiotics for urinary tract infection.  Past Medical History  Diagnosis Date  . Anxiety   . Chronic diastolic CHF (congestive heart failure) (HCC)   . Depression   . Diabetes mellitus     Followed by Dr. Tedd Sias at [facility]  . GERD (gastroesophageal reflux disease)   . Hypertension   . Hypothyroidism   . Osteoporosis   . Allergy   . Hyperlipidemia   . RLS (restless legs syndrome)   . Pancreatic disease     pancreatic failure  . Urinary incontinence   . Pancreatitis     Pancreatic resection/ open wound- [facility] 11/04  . Pneumonia     Pneumonia/ Emphysema 05/05  . 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 TIA (transient ischemic attack)     recurrent 12/15  . History of pleural empyema   . Sepsis (HCC)   . Frequent falls   . Arthritis     left knee, [facility]  . CAD (coronary artery disease), native coronary artery      1//15  T mid-LAD  stent OM2  . Parkinson's disease [facility])     Patient Active Problem List   Diagnosis Date Noted  . Hypoglycemia 09/11/2015  . Right shoulder pain  06/01/2015  . Right arm pain 05/11/2015  . Insomnia 03/17/2015  . GIB (gastrointestinal bleeding) 03/12/2015  . Chronic fatigue 03/10/2015  . Toe pain, left 03/10/2015  . Paroxysmal atrial fibrillation (HCC) 03/02/2015  . CKD (chronic kidney disease), stage III 02/12/2015  . Frequent falls 02/12/2015  . Anxiety 02/12/2015  . H/O resection of pancreas 02/12/2015  . Dysphagia, pharyngoesophageal phase 01/23/2015  . Memory loss 10/23/2014  . CVA (cerebral infarction) 08/07/2014  . Left rotator cuff tear 08/07/2014  . Coronary artery disease 09/12/2013  . Dyspnea 06/20/2013  . Tremor 06/07/2012  . Osteoarthrosis, unspecified whether generalized or localized, involving lower leg 02/27/2012  . Open abdominal wall wound 04/12/2010  . THROMBOCYTOPENIA 07/30/2009  . Hyperlipidemia 10/27/2008  . Hypothyroidism 03/07/2007  . Diabetes type 2, controlled (HCC) 03/07/2007  . DEPRESSION 03/07/2007  . Essential hypertension 03/07/2007  . FAILURE, DIASTOLIC HEART, CHRONIC 03/07/2007  . GERD 03/07/2007    Past Surgical History  Procedure Laterality Date  . Breast lumpectomy      Lumpectomy right breast  . 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 Abdominal hysterectomy      Hysteroscopy/ D & C ([facility]) 12/05  . Removal of pancreas  2004  . Kyphoplasty  2005  . Broken shoulder and orbital bone  2005  . Cholecystectomy  1973  . Thoracentesis    .  Vaginal delivery      7  . Cardiac catheterization  08/29/2013    x1 stent @ [facility]  . Cataract surgery      No current outpatient prescriptions on file.  Allergies Citalopram hydrobromide; Diphenhydramine; Metoclopramide; Paroxetine; and Tape  Family History  Problem Relation Age of Onset  . Heart disease Mother   . Heart attack Mother   . Hypertension Mother   . Heart attack Father     Social History Social History  Substance Use Topics  . Smoking status: Never Smoker   . Smokeless  tobacco: Never Used  . Alcohol Use: No    Review of Systems Constitutional: Chills Eyes: No visual changes. ENT: No sore throat. Cardiovascular: Denies chest pain. Respiratory: Denies shortness of breath. Gastrointestinal: No abdominal pain.  No nausea, no vomiting. No constipation. Genitourinary: Negative for dysuria. Musculoskeletal: Negative for back pain. Skin: Negative for rash. Neurological: Negative for headaches, focal weakness or numbness.  10-point ROS otherwise negative.  ____________________________________________   PHYSICAL EXAM:  VITAL SIGNS: ED Triage Vitals  Enc Vitals Group     BP 09/11/15 1212 161/93 mmHg     Pulse Rate 09/11/15 1212 65     Resp --      Temp 09/11/15 1221 92.8 F (33.8 C)     Temp Source 09/11/15 1221 Rectal     SpO2 09/11/15 1212 94 %     Weight 09/11/15 1212 200 lb 5 oz (90.861 kg)     Height 09/11/15 1212  (1.499 m)     Head Cir --      Peak Flow --      Pain Score --      Pain Loc --      Pain Edu? --      Excl. in GC? --    Constitutional: Alert and oriented. Somewhat fatigued and slightly pale appearing Eyes: Conjunctivae are normal. PERRL. EOMI. Head: Atraumatic. Nose: No congestion/rhinnorhea. Mouth/Throat: Mucous membranes are dry.  Oropharynx non-erythematous. Neck: No stridor.   Cardiovascular: Normal rate, regular rhythm. Grossly normal heart sounds.  Good peripheral circulation. Respiratory: Normal respiratory effort.  No retractions. Lungs CTAB. Gastrointestinal: Soft and nontender. No distention. Musculoskeletal: No lower extremity tenderness nor edema.  No joint effusions. Neurologic:  Normal speech and language. No gross focal neurologic deficits are appreciated. No gait instability. Skin:  Skin is warm, dry and intact. No rash noted. Psychiatric: Mood and affect are normal. Speech and behavior are normal.  ____________________________________________   LABS (all labs ordered are listed, but only  abnormal results are displayed)  Labs Reviewed  C DIFFICILE QUICK SCREEN W PCR REFLEX - Abnormal; Notable for the following:    C Diff antigen POSITIVE (*)    All other components within normal limits  GLUCOSE, CAPILLARY - Abnormal; Notable for the following:    Glucose-Capillary 55 (*)    All other components within normal limits  BASIC METABOLIC PANEL - Abnormal; Notable for the following:    Sodium 146 (*)    CO2 36 (*)    Glucose, Bld 133 (*)    GFR calc non Af Amer 56 (*)    All other components within normal limits  CBC - Abnormal; Notable for the following:    MCHC 31.9 (*)    RDW 14.6 (*)    Platelets 135 (*)    All other components within normal limits  URINALYSIS COMPLETEWITH MICROSCOPIC ([facility] ONLY) - Abnormal; Notable for the following:    Color, Urine  YELLOW (*)    APPearance CLEAR (*)    Glucose, UA >500 (*)    Ketones, ur TRACE (*)    Bacteria, UA MANY (*)    Squamous Epithelial / LPF 0-5 (*)    All other components within normal limits  LACTIC ACID, PLASMA - Abnormal; Notable for the following:    Lactic Acid, Venous 3.0 (*)    All other components within normal limits  GLUCOSE, CAPILLARY - Abnormal; Notable for the following:    Glucose-Capillary 235 (*)    All other components within normal limits  GLUCOSE, CAPILLARY - Abnormal; Notable for the following:    Glucose-Capillary 214 (*)    All other components within normal limits  GASTROINTESTINAL PANEL BY PCR, STOOL (REPLACES STOOL CULTURE)  CLOSTRIDIUM DIFFICILE BY PCR  CULTURE, BLOOD (ROUTINE X 2)  CULTURE, BLOOD (ROUTINE X 2)  TSH  LACTIC ACID, PLASMA  COMPREHENSIVE METABOLIC PANEL  CBC  CBG MONITORING, ED  CBG MONITORING, ED  CBG MONITORING, ED  CBG MONITORING, ED  CBG MONITORING, ED  CBG MONITORING, ED  CBG MONITORING, ED  CBG MONITORING, ED  CBG MONITORING, ED  CBG MONITORING, ED  CBG MONITORING, ED  CBG MONITORING, ED  CBG MONITORING, ED  CBG MONITORING, ED  CBG MONITORING, ED  CBG  MONITORING, ED  CBG MONITORING, ED  CBG MONITORING, ED  CBG MONITORING, ED   ____________________________________________  EKG  Reviewed and interpreted me at 1233 Vent rate 64 PR 160 QRS 80 QTc 450 Reviewed and interpreted as normal sinus rhythm, evidence of old anteroseptal infarct noted, some slight artifact noted in lateral precordial leads but no evidence of acute ischemic normality as noted ____________________________________________  RADIOLOGY  DG Chest Port 1 View (Final result) Result time: 09/11/15 13:06:27   Final result by Rad Results In Interface (09/11/15 13:06:27)   Narrative:   CLINICAL DATA: Altered mental status/ hypoglycemia  EXAM: PORTABLE CHEST 1 VIEW  COMPARISON: Jan 12, 2014  FINDINGS: No edema or consolidation. Heart is slightly enlarged with pulmonary vascularity within normal limits. No adenopathy. No bone lesions. There is degenerative change in each shoulder.  IMPRESSION: No edema or consolidation.   Electronically Signed By: Bretta Bang III M.D. On: 09/11/2015 13:06    ____________________________________________   PROCEDURES  Procedure(s) performed: None  Critical Care performed: No  ____________________________________________   INITIAL IMPRESSION / ASSESSMENT AND PLAN / ED COURSE  Pertinent labs & imaging results that were available during my care of the patient were reviewed by me and considered in my medical decision making (see chart for details).  Patient is for evaluation of an episode of low blood sugar. She is also noted to be hypothermic and associated diarrhea for which she's recently been on antibiotics. Suspect infectious diarrhea, however also treating for hypoglycemia for which she took significant oral intake and home but does not improve her symptoms.  Patient improving with active warming, continues to have multiple loose stools, suspicious for possible C. difficile. We'll send for additional  testing percent hospitalist service will follow-up on.  Showing improvement after warm fluids, hydration, and warming. Admit to the hospital for ongoing management and evaluation. Stool cultures pending. ____________________________________________   FINAL CLINICAL IMPRESSION(S) / ED DIAGNOSES  Final diagnoses:  Hypothermia, initial encounter  Hypoglycemia      Sharyn Creamer, MD 09/11/15 1740

## 2015-09-12 LAB — CBC
HCT: 37.8 % (ref 35.0–47.0)
Hemoglobin: 12.3 g/dL (ref 12.0–16.0)
MCH: 30.4 pg (ref 26.0–34.0)
MCHC: 32.5 g/dL (ref 32.0–36.0)
MCV: 93.8 fL (ref 80.0–100.0)
PLATELETS: 114 10*3/uL — AB (ref 150–440)
RBC: 4.03 MIL/uL (ref 3.80–5.20)
RDW: 14.1 % (ref 11.5–14.5)
WBC: 8.2 10*3/uL (ref 3.6–11.0)

## 2015-09-12 LAB — GLUCOSE, CAPILLARY
GLUCOSE-CAPILLARY: 106 mg/dL — AB (ref 65–99)
GLUCOSE-CAPILLARY: 107 mg/dL — AB (ref 65–99)
GLUCOSE-CAPILLARY: 300 mg/dL — AB (ref 65–99)
GLUCOSE-CAPILLARY: 339 mg/dL — AB (ref 65–99)
GLUCOSE-CAPILLARY: 71 mg/dL (ref 65–99)
GLUCOSE-CAPILLARY: 91 mg/dL (ref 65–99)
Glucose-Capillary: 95 mg/dL (ref 65–99)
Glucose-Capillary: 102 mg/dL — ABNORMAL HIGH (ref 65–99)
Glucose-Capillary: 103 mg/dL — ABNORMAL HIGH (ref 65–99)
Glucose-Capillary: 91 mg/dL (ref 65–99)
Glucose-Capillary: 70 mg/dL (ref 65–99)
Glucose-Capillary: 74 mg/dL (ref 65–99)
Glucose-Capillary: 202 mg/dL — ABNORMAL HIGH (ref 65–99)
Glucose-Capillary: 221 mg/dL — ABNORMAL HIGH (ref 65–99)

## 2015-09-12 LAB — COMPREHENSIVE METABOLIC PANEL
ALBUMIN: 2.7 g/dL — AB (ref 3.5–5.0)
ALK PHOS: 66 U/L (ref 38–126)
ALT: 5 U/L — AB (ref 14–54)
AST: 14 U/L — ABNORMAL LOW (ref 15–41)
Anion gap: 4 — ABNORMAL LOW (ref 5–15)
BILIRUBIN TOTAL: 0.5 mg/dL (ref 0.3–1.2)
BUN: 12 mg/dL (ref 6–20)
CALCIUM: 8.2 mg/dL — AB (ref 8.9–10.3)
CO2: 31 mmol/L (ref 22–32)
CREATININE: 0.75 mg/dL (ref 0.44–1.00)
Chloride: 106 mmol/L (ref 101–111)
GFR calc Af Amer: >60 mL/min (ref >60)
GFR calc non Af Amer: >60 mL/min (ref >60)
GLUCOSE: 85 mg/dL (ref 65–99)
Potassium: 4.3 mmol/L (ref 3.5–5.1)
SODIUM: 141 mmol/L (ref 135–145)
TOTAL PROTEIN: 5.4 g/dL — AB (ref 6.5–8.1)

## 2015-09-12 MED ORDER — INSULIN ASPART 100 UNIT/ML ~~LOC~~ SOLN
0.0000 [IU] | Freq: Three times a day (TID) | SUBCUTANEOUS | Status: DC
  Administered 2015-09-12: 7 [IU] via SUBCUTANEOUS
  Administered 2015-09-13 (×2): 3 [IU] via SUBCUTANEOUS
  Administered 2015-09-13: 5 [IU] via SUBCUTANEOUS
  Administered 2015-09-13 – 2015-09-14 (×2): 7 [IU] via SUBCUTANEOUS
  Administered 2015-09-14: 5 [IU] via SUBCUTANEOUS
  Filled 2015-09-12: qty 5
  Filled 2015-09-12 (×3): qty 7
  Filled 2015-09-12: qty 3
  Filled 2015-09-12: qty 5
  Filled 2015-09-12: qty 3

## 2015-09-12 MED ORDER — INSULIN ASPART 100 UNIT/ML ~~LOC~~ SOLN: 0.0000 [IU] | Freq: Three times a day (TID) | SUBCUTANEOUS | Status: DC

## 2015-09-12 MED ORDER — METRONIDAZOLE 500 MG PO TABS
500.0000 mg | ORAL_TABLET | Freq: Three times a day (TID) | ORAL | Status: DC
  Administered 2015-09-12 – 2015-09-13 (×3): 500 mg via ORAL
  Filled 2015-09-12 (×3): qty 1

## 2015-09-12 NOTE — Progress Notes (Signed)
Dr Anne Hahn notified of pt's BSL has increased to 339. Pt has no insulin coverage for high BSL.  MD stated he will place orders for ACHS SS.

## 2015-09-12 NOTE — Progress Notes (Signed)
Dr. Winona Legato said to put the patient back on isolation since we are treating her for C Diff even though the toxin is negative and the antigen is positive.

## 2015-09-12 NOTE — Progress Notes (Signed)
[facility] - [place] at [facility]   PATIENT NAME: [name]    MR#:  [id number]  DATE OF BIRTH:  [birth year]  SUBJECTIVE:  CHIEF COMPLAINT:   Chief Complaint  Patient presents with  . Hypoglycemia   patient is a 82 year old Caucasian female who presents to the hospital with poor oral intake, diarrhea and hypoglycemia. Patient's C. difficile antigen was positive, however, C. difficile toxin is negative. Patient is receiving Flagyl intravenously. Patient complains of lower abdominal pains, tenesmus type. Continues to have diarrhea despite the Flagyl intravenously. Oral intake remains low, although 75% as documented per nursing staff  Review of Systems  Constitutional: Negative for fever, chills and weight loss.  HENT: Negative for congestion.   Eyes: Negative for blurred vision and double vision.  Respiratory: Negative for cough, sputum production, shortness of breath and wheezing.   Cardiovascular: Negative for chest pain, palpitations, orthopnea, leg swelling and PND.  Gastrointestinal: Positive for abdominal pain and diarrhea. Negative for nausea, vomiting, constipation and blood in stool.  Genitourinary: Negative for dysuria, urgency, frequency and hematuria.  Musculoskeletal: Negative for falls.  Neurological: Negative for dizziness, tremors, focal weakness and headaches.  Endo/Heme/Allergies: Does not bruise/bleed easily.  Psychiatric/Behavioral: Negative for depression. The patient does not have insomnia.     VITAL SIGNS: Blood pressure 119/49, pulse 72, temperature 98.6 F (37 C), temperature source Oral, resp. rate 14, height  (1.499 m), weight 87.862 kg (193 lb 11.2 oz), SpO2 98 %.  PHYSICAL EXAMINATION:   GENERAL:  82 y.o.-year-old patient lying in the bed with no acute distress.  EYES: Pupils equal, round, reactive to light and accommodation. No scleral icterus. Extraocular muscles intact.  HEENT: Head atraumatic, normocephalic.  Oropharynx and nasopharynx clear.  NECK:  Supple, no jugular venous distention. No thyroid enlargement, no tenderness.  LUNGS: Normal breath sounds bilaterally, no wheezing, rales,rhonchi or crepitation. No use of accessory muscles of respiration.  CARDIOVASCULAR: S1, S2 normal. No murmurs, rubs, or gallops.  ABDOMEN: Soft, nontender, nondistended. Bowel sounds present, active. No organomegaly or mass.  EXTREMITIES: No pedal edema, cyanosis, or clubbing.  NEUROLOGIC: Cranial nerves II through XII are intact. Muscle strength 5/5 in all extremities. Sensation intact. Gait not checked.  PSYCHIATRIC: The patient is alert and oriented x 3.  SKIN: No obvious rash, lesion, or ulcer.   ORDERS/RESULTS REVIEWED:   CBC  Recent Labs Lab 09/11/15 1237 09/12/15 0627  WBC 9.4 8.2  HGB 15.0 12.3  HCT 46.9 37.8  PLT 135* 114*  MCV 95.2 93.8  MCH 30.4 30.4  MCHC 31.9* 32.5  RDW 14.6* 14.1   ------------------------------------------------------------------------------------------------------------------  Chemistries   Recent Labs Lab 09/11/15 1237 09/12/15 0627  NA 146* 141  K 3.8 4.3  CL 104 106  CO2 36* 31  GLUCOSE 133* 85  BUN 19 12  CREATININE 0.91 0.75  CALCIUM 9.0 8.2*  AST  --  14*  ALT  --  5*  ALKPHOS  --  66  BILITOT  --  0.5   ------------------------------------------------------------------------------------------------------------------ estimated creatinine clearance is 50.5 mL/min (by C-G formula based on Cr of 0.75). ------------------------------------------------------------------------------------------------------------------  Recent Labs  09/11/15 1237  TSH 2.315    Cardiac Enzymes No results for input(s): CKMB, TROPONINI, MYOGLOBIN in the last 168 hours.  Invalid input(s): CK ------------------------------------------------------------------------------------------------------------------ Invalid input(s):  POCBNP ---------------------------------------------------------------------------------------------------------------  RADIOLOGY: Ct Abdomen Pelvis W Contrast  09/11/2015  CLINICAL DATA:  Abdominal pain and diarrhea. EXAM: CT ABDOMEN AND PELVIS WITH CONTRAST TECHNIQUE: Multidetector CT imaging of the  abdomen and pelvis was performed using the standard protocol following bolus administration of intravenous contrast. CONTRAST:  OMNIPAQUE IOHEXOL 300 MG/ML  SOLN COMPARISON:  03/13/2015 FINDINGS: Lower chest: Bibasilar hypoventilatory findings. There is a stable right subpleural thickening. Hepatobiliary: No masses.  There is persistent pneumobilia. 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 The residual pancreas is somewhat atrophic but otherwise unremarkable. Spleen: 5 mm subcapsular splenic lesion is stable. There is subtle hypoattenuation of the superior lateral portion of the spleen, suggestive of less prominent perfusion abnormality. Adrenals/Urinary Tract: There is bilateral renal cortical atrophy. Mostly exophytic right upper pole 3.3 cm renal cyst is noted. Stomach/Bowel: No evidence of obstruction, inflammatory process, or abnormal fluid collections. Vascular/Lymphatic: No pathologically enlarged lymph nodes. No evidence of abdominal aortic aneurysm. There is atherosclerotic disease of the abdominal aorta and its main branches. The splenic vein is not identified. Collateral venous supply to the spleen is again seen. Main portal vein is patent. Reproductive: No mass or other significant abnormality. Other: Diastases of the anterior abdominal wall and postsurgical changes are again noted. Musculoskeletal: No suspicious bone lesions identified. Vertebroplasty of T11 and T10 is again noted. Multilevel osteoarthritic changes of the lumbar spine are seen. IMPRESSION: No evidence of small-bowel obstruction or colitis. Stable postsurgical changes of distal  pancreatectomy. Stable pneumobilia. 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 Electronically Signed   By: Ted Mcalpine M.D.   On: 09/11/2015 22:41   Dg Chest Port 1 View  09/11/2015  CLINICAL DATA:  Altered mental status/ hypoglycemia EXAM: PORTABLE CHEST 1 VIEW COMPARISON:  Jan 12, 2014 FINDINGS: No edema or consolidation. Heart is slightly enlarged with pulmonary vascularity within normal limits. No adenopathy. No bone lesions. There is degenerative change in each shoulder. IMPRESSION: No edema or consolidation. Electronically Signed   By: Bretta Bang III M.D.   On: 09/11/2015 13:06    EKG:  Orders placed or performed during the hospital encounter of 09/11/15  . EKG 12-Lead  . EKG 12-Lead  . ED EKG  . ED EKG    ASSESSMENT AND PLAN:  Active Problems:   Hypoglycemia  #1. Hypoglycemia in diabetic, will need decrease insulin Lantus dose again upon discharge, observing patient's oral intake, most recent meal, 75% consumed #2 C. difficile enterocolitis, continue patient on Flagyl  change or to oral if no nausea.  #3. Thrombocytopenia, follow in the morning #4. Hyperthermia, resolved   Management plans discussed with the patient, family and they are in agreement.   DRUG ALLERGIES:  Allergies  Allergen Reactions  . Citalopram Hydrobromide Nausea And Vomiting and Other (See Comments)    Reaction:  Dizziness and dry mouth   . Diphenhydramine Other (See Comments)    Reaction:  Unknown   . Metoclopramide Other (See Comments)    Reaction:  Unknown   . Paroxetine Other (See Comments)    Reaction:  Hallucinations   . Tape Rash    CODE STATUS:     Code Status Orders        Start     Ordered   09/11/15 1428  Full code   Continuous     09/11/15 1428    Code Status History    Date Active Date Inactive Code Status Order ID Comments User Context    03/12/2015  2:30 PM 03/14/2015  2:54 PM DNR [id number]  Shaune Pollack, MD Inpatient  02/12/2015  4:25 AM 02/13/2015  6:20 PM DNR [id number]  Oralia Manis, MD Inpatient      TOTAL TIME TAKING CARE OF THIS PATIENT: 35 minutes.    Katharina Caper M.D on 09/12/2015 at 1:24 PM  Between 7am to 6pm - Pager - [phone]  After 6pm go to [email or web address] - password [email or web address] [facility]  [place] [facility]  Office  [phone]  CC: Primary care physician; Wynona Dove, MD

## 2015-09-13 LAB — BASIC METABOLIC PANEL
Anion gap: 8 (ref 5–15)
BUN: 13 mg/dL (ref 6–20)
CALCIUM: 9 mg/dL (ref 8.9–10.3)
CHLORIDE: 104 mmol/L (ref 101–111)
CO2: 28 mmol/L (ref 22–32)
CREATININE: 0.89 mg/dL (ref 0.44–1.00)
GFR calc Af Amer: >60 mL/min (ref >60)
GFR, EST NON AFRICAN AMERICAN: 58 mL/min — AB (ref >60)
Glucose, Bld: 269 mg/dL — ABNORMAL HIGH (ref 65–99)
Potassium: 5.1 mmol/L (ref 3.5–5.1)
Sodium: 140 mmol/L (ref 135–145)

## 2015-09-13 LAB — GLUCOSE, CAPILLARY
GLUCOSE-CAPILLARY: 221 mg/dL — AB (ref 65–99)
GLUCOSE-CAPILLARY: 222 mg/dL — AB (ref 65–99)
GLUCOSE-CAPILLARY: 290 mg/dL — AB (ref 65–99)
GLUCOSE-CAPILLARY: 336 mg/dL — AB (ref 65–99)
Glucose-Capillary: 232 mg/dL — ABNORMAL HIGH (ref 65–99)
Glucose-Capillary: 221 mg/dL — ABNORMAL HIGH (ref 65–99)
Glucose-Capillary: 254 mg/dL — ABNORMAL HIGH (ref 65–99)
Glucose-Capillary: 216 mg/dL — ABNORMAL HIGH (ref 65–99)
Glucose-Capillary: 288 mg/dL — ABNORMAL HIGH (ref 65–99)
Glucose-Capillary: 326 mg/dL — ABNORMAL HIGH (ref 65–99)
Glucose-Capillary: 301 mg/dL — ABNORMAL HIGH (ref 65–99)

## 2015-09-13 LAB — PLATELET COUNT: PLATELETS: 122 10*3/uL — AB (ref 150–440)

## 2015-09-13 MED ORDER — METHYLPREDNISOLONE SODIUM SUCC 125 MG IJ SOLR
60.0000 mg | INTRAMUSCULAR | Status: DC
  Administered 2015-09-13: 60 mg via INTRAVENOUS
  Filled 2015-09-13: qty 2

## 2015-09-13 MED ORDER — RISAQUAD PO CAPS
1.0000 | ORAL_CAPSULE | Freq: Every day | ORAL | Status: DC
  Administered 2015-09-13 – 2015-09-14 (×2): 1 via ORAL
  Filled 2015-09-13 (×2): qty 1

## 2015-09-13 MED ORDER — VANCOMYCIN 50 MG/ML ORAL SOLUTION
250.0000 mg | Freq: Four times a day (QID) | ORAL | Status: DC
  Administered 2015-09-13 – 2015-09-14 (×5): 250 mg via ORAL
  Filled 2015-09-13 (×8): qty 5

## 2015-09-13 NOTE — Evaluation (Signed)
Physical Therapy Evaluation Patient Details Name: [name] MRN: [id number] DOB: [birth year] Today's Date: 09/13/2015   History of Present Illness  presented to ER secondary to AMS, FSBS 33 (upon EMS arrival) requiring D50 administration; admitted for management of symptomatic hypoglycemia.  Clinical Impression  Upon evaluation, patient alert and oriented, follows commands and demonstrates fair/good safety awareness/insight.  Bilat UE/LEs grossly symmetrical and WFL for basic transfers and mobility, but globally deconditioned (strength at least 4-/5).  Currently requiring min assist for all functional activities with RW, but unable to tolerate gait beyond 3-4 steps at this time due to fatigue/SOB.  Question ability to tolerate household distance and activities given noted cardiopulmonary endurance deficits.  Will continue to monitor and progress activity as appropriate (. Would benefit from skilled PT to address above deficits and promote optimal return to PLOF; recommend transition to STR upon discharge from acute hospitalization.  May progress to HHPT pending additional mobility assessment (will need youth RW)     Follow Up Recommendations Home health PT (anticipate home with HHPT pending additional mobility assessment)    Equipment Recommendations       Recommendations for Other Services       Precautions / Restrictions Precautions Precautions: Fall Precaution Comments: Enteric isolation Restrictions Weight Bearing Restrictions: No      Mobility  Bed Mobility Overal bed mobility: Needs Assistance Bed Mobility: Supine to Sit     Supine to sit: Min assist     General bed mobility comments: assist for truncal elevation, heavy use of bedrails  Transfers Overall transfer level: Needs assistance Equipment used: Rolling walker (2 wheeled) Transfers: Sit to/from Stand Sit to Stand: Min assist;Min guard            Ambulation/Gait Ambulation/Gait assistance: Min  guard;Min assist Ambulation Distance (Feet): 4 Feet Assistive device: Rolling walker (2 wheeled)       General Gait Details: 3-4 steps forward/backward and laterally with RW, cga/min assist; broad BOS with limited balance reactions, requires bilat UE support. Notably fatigued/SOB with minimal exertion; unable to tolerate additional activity at this time.  Stairs            Wheelchair Mobility    Modified Rankin (Stroke Patients Only)       Balance Overall balance assessment: Needs assistance Sitting-balance support: No upper extremity supported;Feet supported Sitting balance-Leahy Scale: Good     Standing balance support: Bilateral upper extremity supported Standing balance-Leahy Scale: Fair                               Pertinent Vitals/Pain Pain Assessment: No/denies pain    Home Living Family/patient expects to be discharged to:: Private residence Living Arrangements: Alone Available Help at Discharge: Family;Friend(s) (has aide 7 hours/day, 7 days/week) Type of Home: House       Home Layout: Two level;Able to live on main level with bedroom/bathroom Home Equipment: Walker - 4 wheels;Shower seat;Bedside commode      Prior Function Level of Independence: Needs assistance         Comments: Mod indep with household mobility using RW; assist for ADLs and household activities as appropriate.  Aide 7 hours/day, 7 days/week for assist with ADLs and household chores.     Hand Dominance   Dominant Hand: Right    Extremity/Trunk Assessment   Upper Extremity Assessment: Overall WFL for tasks assessed           Lower Extremity Assessment: Generalized weakness (  grossly at least 4/5)         Communication   Communication: No difficulties  Cognition Arousal/Alertness: Awake/alert Behavior During Therapy: WFL for tasks assessed/performed Overall Cognitive Status: Within Functional Limits for tasks assessed                       General Comments      Exercises        Assessment/Plan    PT Assessment Patient needs continued PT services  PT Diagnosis Difficulty walking;Generalized weakness   PT Problem List Decreased strength;Decreased activity tolerance;Decreased range of motion;Decreased balance;Decreased mobility;Decreased knowledge of use of DME;Decreased safety awareness;Decreased knowledge of precautions;Cardiopulmonary status limiting activity;Obesity  PT Treatment Interventions DME instruction;Gait training;Stair training;Functional mobility training;Therapeutic activities;Therapeutic exercise;Balance training;Patient/family education   PT Goals (Current goals can be found in the Care Plan section) Acute Rehab PT Goals Patient Stated Goal: "to return home" PT Goal Formulation: With patient/family Time For Goal Achievement: 09/27/15 Potential to Achieve Goals: Good    Frequency Min 2X/week   Barriers to discharge        Co-evaluation               End of Session Equipment Utilized During Treatment: Gait belt Activity Tolerance: Patient limited by fatigue Patient left: in bed;with call bell/phone within reach;with bed alarm set;with family/visitor present Nurse Communication: Mobility status         Time: 1610-9604 PT Time Calculation (min) (ACUTE ONLY): 18 min   Charges:   PT Evaluation $PT Eval Low Complexity: 1 Procedure     PT G Codes:        Lonn Im H. Manson Passey, PT, DPT, NCS 09/13/2015, 3:10 PM [phone]

## 2015-09-13 NOTE — Plan of Care (Signed)
Problem: Physical Regulation: Goal: Will remain free from infection Outcome: Progressing Pt remains on Cdiff precautions  Problem: Skin Integrity: Goal: Risk for impaired skin integrity will decrease Outcome: Progressing Pink foam to mid abdominal wound  Problem: Activity: Goal: Risk for activity intolerance will decrease Outcome: Not Progressing Using bedpan

## 2015-09-13 NOTE — Progress Notes (Signed)
[facility] - [place] at [facility]   PATIENT NAME: [name]    MR#:  [id number]  DATE OF BIRTH:  18-Nov-1928  SUBJECTIVE:  CHIEF COMPLAINT:   Chief Complaint  Patient presents with  . Hypoglycemia   patient is a 82 year old Caucasian female who presents to the hospital with poor oral intake, diarrhea and hypoglycemia. Patient's C. difficile antigen was positive, however, C. difficile toxin is negative. Patient is receiving Flagyl orally, still continues to have intermittent diarrhea. Developed a rash on the chest as well as abdomen today. Suspected Flagyl is being discontinued and oral vancomycin is initiated. Benadryl was about to be ordered. However, it appears that patient is allergic to it, steroids are going to be initiated to control itching.    Review of Systems  Constitutional: Negative for fever, chills and weight loss.  HENT: Negative for congestion.   Eyes: Negative for blurred vision and double vision.  Respiratory: Negative for cough, sputum production, shortness of breath and wheezing.   Cardiovascular: Negative for chest pain, palpitations, orthopnea, leg swelling and PND.  Gastrointestinal: Positive for abdominal pain and diarrhea. Negative for nausea, vomiting, constipation and blood in stool.  Genitourinary: Negative for dysuria, urgency, frequency and hematuria.  Musculoskeletal: Negative for falls.  Neurological: Negative for dizziness, tremors, focal weakness and headaches.  Endo/Heme/Allergies: Does not bruise/bleed easily.  Psychiatric/Behavioral: Negative for depression. The patient does not have insomnia.     VITAL SIGNS: Blood pressure 150/66, pulse 87, temperature 97.9 F (36.6 C), temperature source Oral, resp. rate 17, height  (1.499 m), weight 87.816 kg (193 lb 9.6 oz), SpO2 95 %.  PHYSICAL EXAMINATION:   GENERAL:  82 y.o.-year-old patient lying in the bed with no acute distress.  EYES: Pupils equal, round, reactive  to light and accommodation. No scleral icterus. Extraocular muscles intact.  HEENT: Head atraumatic, normocephalic. Oropharynx and nasopharynx clear.  NECK:  Supple, no jugular venous distention. No thyroid enlargement, no tenderness.  LUNGS: Normal breath sounds bilaterally, no wheezing, rales,rhonchi or crepitation. No use of accessory muscles of respiration.  CARDIOVASCULAR: S1, S2 normal. No murmurs, rubs, or gallops.  ABDOMEN: Soft, nontender, nondistended. Bowel sounds present, active. No organomegaly or mass.  EXTREMITIES: No pedal edema, cyanosis, or clubbing.  NEUROLOGIC: Cranial nerves II through XII are intact. Muscle strength 5/5 in all extremities. Sensation intact. Gait not checked.  PSYCHIATRIC: The patient is alert and oriented x 3.  SKIN: Fine erythematous confluent rash was noted on the chest as well as abdominal area, itching, patient scratches vigorously  ORDERS/RESULTS REVIEWED:   CBC  Recent Labs Lab 09/11/15 1237 09/12/15 0627  WBC 9.4 8.2  HGB 15.0 12.3  HCT 46.9 37.8  PLT 135* 114*  MCV 95.2 93.8  MCH 30.4 30.4  MCHC 31.9* 32.5  RDW 14.6* 14.1   ------------------------------------------------------------------------------------------------------------------  Chemistries   Recent Labs Lab 09/11/15 1237 09/12/15 0627  NA 146* 141  K 3.8 4.3  CL 104 106  CO2 36* 31  GLUCOSE 133* 85  BUN 19 12  CREATININE 0.91 0.75  CALCIUM 9.0 8.2*  AST  --  14*  ALT  --  5*  ALKPHOS  --  66  BILITOT  --  0.5   ------------------------------------------------------------------------------------------------------------------ estimated creatinine clearance is 50.4 mL/min (by C-G formula based on Cr of 0.75). ------------------------------------------------------------------------------------------------------------------  Recent Labs  09/11/15 1237  TSH 2.315    Cardiac Enzymes No results for input(s): CKMB, TROPONINI, MYOGLOBIN in the last 168  hours.  Invalid  input(s): CK ------------------------------------------------------------------------------------------------------------------ Invalid input(s): POCBNP ---------------------------------------------------------------------------------------------------------------  RADIOLOGY: Ct Abdomen Pelvis W Contrast  09/11/2015  CLINICAL DATA:  Abdominal pain and diarrhea. EXAM: CT ABDOMEN AND PELVIS WITH CONTRAST TECHNIQUE: Multidetector CT imaging of the abdomen and pelvis was performed using the standard protocol following bolus administration of intravenous contrast. CONTRAST:  OMNIPAQUE IOHEXOL 300 MG/ML  SOLN COMPARISON:  03/13/2015 FINDINGS: Lower chest: Bibasilar hypoventilatory findings. There is a stable right subpleural thickening. Hepatobiliary: No masses.  There is persistent pneumobilia. 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 The residual pancreas is somewhat atrophic but otherwise unremarkable. Spleen: 5 mm subcapsular splenic lesion is stable. There is subtle hypoattenuation of the superior lateral portion of the spleen, suggestive of less prominent perfusion abnormality. Adrenals/Urinary Tract: There is bilateral renal cortical atrophy. Mostly exophytic right upper pole 3.3 cm renal cyst is noted. Stomach/Bowel: No evidence of obstruction, inflammatory process, or abnormal fluid collections. Vascular/Lymphatic: No pathologically enlarged lymph nodes. No evidence of abdominal aortic aneurysm. There is atherosclerotic disease of the abdominal aorta and its main branches. The splenic vein is not identified. Collateral venous supply to the spleen is again seen. Main portal vein is patent. Reproductive: No mass or other significant abnormality. Other: Diastases of the anterior abdominal wall and postsurgical changes are again noted. Musculoskeletal: No suspicious bone lesions identified. Vertebroplasty of T11 and T10 is again noted.  Multilevel osteoarthritic changes of the lumbar spine are seen. IMPRESSION: No evidence of small-bowel obstruction or colitis. Stable postsurgical changes of distal pancreatectomy. Stable pneumobilia. 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 Electronically Signed   By: Ted Mcalpine M.D.   On: 09/11/2015 22:41    EKG:  Orders placed or performed during the hospital encounter of 09/11/15  . EKG 12-Lead  . EKG 12-Lead  . ED EKG  . ED EKG    ASSESSMENT AND PLAN:  Active Problems:   Hypoglycemia  #1. Hypoglycemia in diabetic, will need decrease insulin Lantus dose again upon discharge, oral intake has improved to 100%, now patient is on steroids, discontinue D5 half-normal saline solution, following blood glucose levels closely, off insulin, just on Accu-Cheks #2 C. difficile enterocolitis, discontinue Flagyl  as patient may have allergy to it, initiate vancomycin orally, follow clinically.  #3. Thrombocytopenia, recheck platelet count #4. Hypothermia, resolved #5 rash, etiology is unclear, possibly related to Flagyl/drug eruption, discontinue Flagyl and initiate steroids intravenously. Follow clinically.    Management plans discussed with the patient, family and they are in agreement.   DRUG ALLERGIES:  Allergies  Allergen Reactions  . Citalopram Hydrobromide Nausea And Vomiting and Other (See Comments)    Reaction:  Dizziness and dry mouth   . Diphenhydramine Other (See Comments)    Reaction:  Unknown   . Metoclopramide Other (See Comments)    Reaction:  Unknown   . Paroxetine Other (See Comments)    Reaction:  Hallucinations   . Tape Rash    CODE STATUS:     Code Status Orders        Start     Ordered   09/11/15 1428  Full code   Continuous     09/11/15 1428    Code Status History    Date Active Date Inactive Code Status Order ID Comments User  Context   03/12/2015  2:30 PM 03/14/2015  2:54 PM DNR [id number]  Shaune Pollack, MD Inpatient  02/12/2015  4:25 AM 02/13/2015  6:20 PM DNR [id number]  Oralia Manis, MD Inpatient      TOTAL TIME TAKING CARE OF THIS PATIENT: 45 minutes.  Discussed with patient's daughter, Steward Drone for approximately 15 minutes  Bradey Luzier M.D on 09/13/2015 at 2:32 PM  Between 7am to 6pm - Pager - [phone]  After 6pm go to [email or web address] - password [email or web address] [facility]  [place] [facility]  Office  [phone]  CC: Primary care physician; Wynona Dove, MD

## 2015-09-13 NOTE — Progress Notes (Signed)
Dr. Winona Legato notified of generalized red, splotchy rash on patient's chest and abdomen. No new orders. MD requested to see and assess patient before treating.  Ron Parker, RN

## 2015-09-14 DIAGNOSIS — R627 Adult failure to thrive: Secondary | ICD-10-CM

## 2015-09-14 DIAGNOSIS — L270 Generalized skin eruption due to drugs and medicaments taken internally: Secondary | ICD-10-CM

## 2015-09-14 DIAGNOSIS — A0472 Enterocolitis due to Clostridium difficile, not specified as recurrent: Secondary | ICD-10-CM

## 2015-09-14 DIAGNOSIS — R531 Weakness: Secondary | ICD-10-CM

## 2015-09-14 DIAGNOSIS — T68XXXA Hypothermia, initial encounter: Secondary | ICD-10-CM

## 2015-09-14 DIAGNOSIS — D696 Thrombocytopenia, unspecified: Secondary | ICD-10-CM

## 2015-09-14 LAB — GLUCOSE, CAPILLARY
GLUCOSE-CAPILLARY: 264 mg/dL — AB (ref 65–99)
GLUCOSE-CAPILLARY: 340 mg/dL — AB (ref 65–99)
GLUCOSE-CAPILLARY: 346 mg/dL — AB (ref 65–99)
GLUCOSE-CAPILLARY: 353 mg/dL — AB (ref 65–99)
Glucose-Capillary: 378 mg/dL — ABNORMAL HIGH (ref 65–99)
Glucose-Capillary: 280 mg/dL — ABNORMAL HIGH (ref 65–99)
Glucose-Capillary: 311 mg/dL — ABNORMAL HIGH (ref 65–99)
Glucose-Capillary: 297 mg/dL — ABNORMAL HIGH (ref 65–99)

## 2015-09-14 LAB — URINE CULTURE: SPECIAL REQUESTS: NORMAL

## 2015-09-14 LAB — HEMOGLOBIN A1C: Hgb A1c MFr Bld: 6 % (ref 4.0–6.0)

## 2015-09-14 MED ORDER — MEGESTROL ACETATE 40 MG PO TABS
400.0000 mg | ORAL_TABLET | Freq: Two times a day (BID) | ORAL | Status: DC
  Filled 2015-09-14 (×2): qty 10

## 2015-09-14 MED ORDER — INSULIN GLARGINE 100 UNIT/ML ~~LOC~~ SOLN
22.0000 [IU] | Freq: Every day | SUBCUTANEOUS | Status: DC
Start: 2015-09-14 — End: 2015-09-17

## 2015-09-14 MED ORDER — ONDANSETRON HCL 4 MG PO TABS
4.0000 mg | ORAL_TABLET | Freq: Once | ORAL | Status: AC
  Administered 2015-09-14: 4 mg via ORAL
  Filled 2015-09-14: qty 1

## 2015-09-14 MED ORDER — ONDANSETRON HCL 4 MG PO TABS: 4.0000 mg | ORAL_TABLET | ORAL | Status: DC | PRN

## 2015-09-14 MED ORDER — CYANOCOBALAMIN 1000 MCG/ML IJ SOLN
1000.0000 ug | Freq: Once | INTRAMUSCULAR | Status: AC
  Administered 2015-09-14: 1000 ug via INTRAMUSCULAR
  Filled 2015-09-14: qty 1

## 2015-09-14 MED ORDER — VANCOMYCIN 50 MG/ML ORAL SOLUTION: 250.0000 mg | Freq: Four times a day (QID) | ORAL | Status: DC

## 2015-09-14 MED ORDER — MEGESTROL ACETATE 400 MG/10ML PO SUSP
400.0000 mg | Freq: Two times a day (BID) | ORAL | Status: DC
  Administered 2015-09-14: 400 mg via ORAL
  Filled 2015-09-14 (×2): qty 10

## 2015-09-14 MED ORDER — MEGESTROL ACETATE 400 MG/10ML PO SUSP: 400.0000 mg | Freq: Two times a day (BID) | ORAL | Status: DC

## 2015-09-14 NOTE — Progress Notes (Signed)
Dr. Winona Legato notified of patient's daughter, Steward Drone, requesting her vancomycin rx be changed to something different due to nausea. Dr. Winona Legato ordered one time dose zofran and home rx for zofran q4. Patient still beig discharged. Daughter, brenda, notified and in agreement.  Ron Parker, RN

## 2015-09-14 NOTE — Clinical Social Work Note (Signed)
CSW entering brief note to say that PT recommended STR. Dr. Winona Legato has spoken to patient and daughter and they are declining rehab at this time and wishing to return home with services. RN CM is arranging home health. York Spaniel MSW,LCSW [phone]

## 2015-09-14 NOTE — Progress Notes (Signed)
Physical Therapy Treatment Patient Details Name: ELNER SEIFERT MRN: [id number] DOB: 29-Oct-1928 Today's Date: 09/14/2015    History of Present Illness presented to ER secondary to AMS, FSBS 33 (upon EMS arrival) requiring D50 administration; admitted for management of symptomatic hypoglycemia.    PT Comments    Patient tolerating increased gait distance and overall activity level this date, but continues to demonstrate significant SOB with minimal activity (requiring intermittent seated rest periods throughout session).  Rates BORG 8/10, but sats remain >90% on RA.  Did reinforce need for activity pacing and energy conservation with household mobility/activities; patient/daughter voiced understanding and agreement.   Follow Up Recommendations  SNF (patient/family preferring home with HHPT and continued assist from private aides established PTA)     Equipment Recommendations       Recommendations for Other Services       Precautions / Restrictions Precautions Precautions: Fall Precaution Comments: Enteric isolation Restrictions Weight Bearing Restrictions: No    Mobility  Bed Mobility Overal bed mobility: Modified Independent Bed Mobility: Supine to Sit     Supine to sit: Modified independent (Device/Increase time)     General bed mobility comments: performed from flat surface with use of bedrails, increased time; transition towards L (to simulate home environment)  Transfers Overall transfer level: Needs assistance Equipment used: Rolling walker (2 wheeled) Transfers: Sit to/from Stand Sit to Stand: Min guard            Ambulation/Gait Ambulation/Gait assistance: Min guard;Min assist Ambulation Distance (Feet): 40 Feet Assistive device: Rolling walker (2 wheeled)       General Gait Details: broad BOS with slightly staggering steps,mildly antalgic with decreased stance time L LE.  No overt buckling or LOB, but notably SOB (BORG 8/10) after minimal distance.   Requiring frequent seated rest periods for recovery.   Stairs            Wheelchair Mobility    Modified Rankin (Stroke Patients Only)       Balance Overall balance assessment: Needs assistance Sitting-balance support: No upper extremity supported;Feet supported Sitting balance-Leahy Scale: Good     Standing balance support: Bilateral upper extremity supported Standing balance-Leahy Scale: Fair                      Cognition Arousal/Alertness: Awake/alert Behavior During Therapy: WFL for tasks assessed/performed Overall Cognitive Status: Within Functional Limits for tasks assessed                      Exercises Other Exercises Other Exercises: Additional gait trials: 38' x2, 33' x1 with RW, cga/min assist--continues with significant SOB during minimal activity, but sats remain >90% on RA at all times.  Managed with intermittent seated rest periods. Other Exercises: Educated on home safety modifications and need for activity pacing with household activities/mobility--patient/daughter voiced understanding and awareness.    General Comments        Pertinent Vitals/Pain Pain Assessment: No/denies pain    Home Living                      Prior Function            PT Goals (current goals can now be found in the care plan section) Acute Rehab PT Goals Patient Stated Goal: "to return home" PT Goal Formulation: With patient/family Time For Goal Achievement: 09/27/15 Potential to Achieve Goals: Good Progress towards PT goals: Progressing toward goals    Frequency  Min 2X/week  PT Plan Current plan remains appropriate    Co-evaluation             End of Session Equipment Utilized During Treatment: Gait belt Activity Tolerance: Patient limited by fatigue Patient left: in bed;with call bell/phone within reach;with bed alarm set;with family/visitor present     Time: 1610-9604 PT Time Calculation (min) (ACUTE ONLY): 26  min  Charges:  $Gait Training: 8-22 mins $Therapeutic Activity: 8-22 mins                    G Codes:      Lizzy Hamre H. Manson Passey, PT, DPT, NCS 09/14/2015, 1:56 PM [phone]

## 2015-09-14 NOTE — Progress Notes (Signed)
Inpatient Diabetes Program Recommendations  AACE/ADA: New Consensus Statement on Inpatient Glycemic Control (2015)  Target Ranges:  Prepandial:   less than 140 mg/dL      Peak postprandial:   less than 180 mg/dL (1-2 hours)      Critically ill patients:  140 - 180 mg/dL  Results for [name], [name] (MRN [id number]) as of 09/14/2015 08:55  Ref. Range 09/13/2015 05:50 09/13/2015 07:40 09/13/2015 10:11 09/13/2015 11:50 09/13/2015 16:14 09/13/2015 18:09 09/13/2015 19:47 09/13/2015 21:59 09/14/2015 00:07 09/14/2015 02:31 09/14/2015 04:16 09/14/2015 05:46 09/14/2015 08:16  Glucose-Capillary Latest Ref Range: 65-99 mg/dL 409 (H) 811 (H) 914 (H) 216 (H) 288 (H) 326 (H) 301 (H) 336 (H) 346 (H) 353 (H) 378 (H) 280 (H) 311 (H)  Review of Glycemic Control  Diabetes history: DM2 Outpatient Diabetes medications: Lantus 70 units QHS, Humalog 8-18 units TID with meals (based on correction scale) Current orders for Inpatient glycemic control: Novolog 0-9 units ACHS  Inpatient Diabetes Program Recommendations: Insulin - Basal: Patient was initially admitted with hypoglycemia which has resolved. Glucose ranged from 216-378 mg/dl over the past 24 hours and fasting glucose is 311 mg/dl this morning. Please consider ordering Lantus 22 units daily starting now (based on 87.9 kg x 0.25 units). Outpatient DM medications: Outpatient Lantus dose will need to be adjusted at time of discharge.  Thanks, Orlando Penner, RN, MSN, CDE Diabetes Coordinator Inpatient Diabetes Program [phone] (Team Pager from 8am to 5pm) [phone] ([facility] office) [phone] [facility] office) [phone] [facility] office)

## 2015-09-14 NOTE — Plan of Care (Signed)
Problem: Physical Regulation: Goal: Will remain free from infection Outcome: Not Progressing cdiff positive     Problem: Skin Integrity: Goal: Risk for impaired skin integrity will decrease Outcome: Not Progressing Abdominal wound     Problem: Activity: Goal: Risk for activity intolerance will decrease Outcome: Not Progressing Generalized weakness

## 2015-09-14 NOTE — Consult Note (Signed)
WOC wound consult note Reason for Consult:Chronic nonhealing surgical wound.  Wound type:surgical wound nonhealing Pressure Ulcer POA: N/A Measurement: scarring to mid abdomen 8 cm x 6.4 cm intact skin  RLQ has 1 cm x 1cm x 0.3 cm nonhealing opening.   Wound ZOX:WRUEAV to visualize to RLQ opening.  Drainage (amount, consistency, odor) Scant creamy drainage Periwound:Scarring Dressing procedure/placement/frequency:Cleanse wound to RLQ with NS.  Gently fill with 1/2" Iodoform packing strip.  Cover with dry dressing.  Dry dressing to mid abdomen.  Change daily.  Will not follow at this time.  Please re-consult if needed.  Maple Hudson RN BSN CWON Pager [phone]

## 2015-09-14 NOTE — Care Management Important Message (Signed)
Important Message  Patient Details  Name: [name] MRN: [id number] Date of Birth: [birth year]   Medicare Important Message Given:  Yes    Chapman Fitch, RN 09/14/2015, 2:10 PM

## 2015-09-14 NOTE — Progress Notes (Signed)
09/14/2015 4:17 PM  BP 159/77 mmHg  Pulse 69  Temp(Src) 98 F (36.7 C) (Oral)  Resp 18  Ht  (1.499 m)  Wt 87.998 kg (194 lb)  BMI 39.16 kg/m2  SpO2 92%  LMP  (LMP Unknown). A&O. VSS. Medicated for nausea x2 with relief noted. Resting comfortably. Patient discharged per MD orders. Discharge instructions reviewed with patient and children. Children and patient verbalized understanding. IV's removed per policy. Prescriptions discussed and given to patient's son. Discharged via wheelchair escorted by nursing staff. Patient assisted safely into car by nursing staff.  Ron Parker, RN

## 2015-09-14 NOTE — Care Management (Signed)
Patient presents with hypoglycemia.  Patient lives at home alone.  Has family local for support.  Patient has [facility] services that provides aids through [facility]. 7 days a week for 6-7 hours a day.  Patient was open to [facility] prior to admission.  Per daughter in law family does dressing changes for the patient 3 days a week. Resumption of care orders placed. Barbara Cower with [facility] notified of pending discharge.  Patient will discharge on Vancomycin. Contacted [facility] pharmacy to determine out of pocket expense. Notified patient and family of outcost of $130.  Daughter in [place] expresses concern that the medication does not make the patient feel well.  She is to let the bedside nurse know her concern prior to discharge.

## 2015-09-14 NOTE — Progress Notes (Signed)
Received phone call from tele clerk that pt's was having apnea per tele monitor.  Checked on pt and pt woke up stating, "I just don't feel right, I feel like I'm having a hard time breathing or wheezing or something." Lung sounds clear. Placed pt on O2  via [place].  Pt states, "I feel a lot better now." O2 sats in upper 90s. Will cont to monitor.

## 2015-09-15 ENCOUNTER — Telehealth: Payer: Self-pay

## 2015-09-15 NOTE — Telephone Encounter (Signed)
Transition Care Management Follow-up Telephone Call   Date discharged? 09/14/15   How have you been since you were released from the hospital? I am doing much better.  Home oxygen in use.  Urinating ok.  No pain, no slurred speech, no trouble breathing, no diarrhea, no cramping, no confusion.  Eating and drinking ok.   Do you understand why you were in the hospital? Yes, I was confused, dehydrated, low blood sugars and having trouble breathing.   Do you understand the discharge instructions? Yes, and I am following accordingly.  My daughter checks on me to make sure all is well.   Where were you discharged to? Home   Items Reviewed:  Medications reviewed: Yes, taking all scheduled medications as appropriate.  Allergies reviewed: Yes, no changes.  Dietary changes reviewed: Yes, no problems.  Referrals reviewed: Yes, appointment made to follow up with primary care provider.   Functional Questionnaire:   Activities of Daily Living (ADLs):   She states they are independent in the following: Toileting, self feeding, toileting. States they require assistance with the following: Ambulating (walker) bathing, dressing, meal prep (Daughter and home health to assist).   Any transportation issues/concerns?: No.   Any patient concerns? None at this time.   Confirmed importance and date/time of follow-up visits scheduled Yes, appointment scheduled 09/17/15.  Provider Appointment booked with Dr. [name] (PCP).  Confirmed with patient if condition begins to worsen call PCP or go to the ER.  Patient was given the office number and encouraged to call back with question or concerns.  : Yes, patient verbalized understanding.

## 2015-09-15 NOTE — Telephone Encounter (Signed)
TCM call placed.  Appointment made.

## 2015-09-16 ENCOUNTER — Telehealth: Payer: Self-pay

## 2015-09-16 ENCOUNTER — Ambulatory Visit: Payer: Self-pay

## 2015-09-16 ENCOUNTER — Encounter: Payer: Medicare Other | Attending: Surgery | Admitting: Surgery

## 2015-09-16 DIAGNOSIS — S31102S Unspecified open wound of abdominal wall, epigastric region without penetration into peritoneal cavity, sequela: Secondary | ICD-10-CM | POA: Insufficient documentation

## 2015-09-16 DIAGNOSIS — E11622 Type 2 diabetes mellitus with other skin ulcer: Secondary | ICD-10-CM | POA: Diagnosis present

## 2015-09-16 DIAGNOSIS — E6609 Other obesity due to excess calories: Secondary | ICD-10-CM | POA: Insufficient documentation

## 2015-09-16 LAB — CULTURE, BLOOD (ROUTINE X 2)
CULTURE: NO GROWTH
CULTURE: NO GROWTH

## 2015-09-16 NOTE — Telephone Encounter (Signed)
FYI :Lantus was changed to 22 units at bedtime. Sugar 169 this morning.

## 2015-09-17 ENCOUNTER — Encounter: Payer: Self-pay | Admitting: Internal Medicine

## 2015-09-17 ENCOUNTER — Ambulatory Visit (INDEPENDENT_AMBULATORY_CARE_PROVIDER_SITE_OTHER): Payer: Medicare Other | Admitting: Internal Medicine

## 2015-09-17 VITALS — BP 109/72 | HR 98 | Temp 98.2°F | Ht 59.0 in | Wt 194.2 lb

## 2015-09-17 DIAGNOSIS — E1122 Type 2 diabetes mellitus with diabetic chronic kidney disease: Secondary | ICD-10-CM | POA: Diagnosis not present

## 2015-09-17 DIAGNOSIS — Z794 Long term (current) use of insulin: Secondary | ICD-10-CM

## 2015-09-17 DIAGNOSIS — A047 Enterocolitis due to Clostridium difficile: Secondary | ICD-10-CM

## 2015-09-17 DIAGNOSIS — M1710 Unilateral primary osteoarthritis, unspecified knee: Secondary | ICD-10-CM

## 2015-09-17 DIAGNOSIS — E46 Unspecified protein-calorie malnutrition: Secondary | ICD-10-CM | POA: Diagnosis not present

## 2015-09-17 DIAGNOSIS — M179 Osteoarthritis of knee, unspecified: Secondary | ICD-10-CM | POA: Diagnosis not present

## 2015-09-17 DIAGNOSIS — N183 Chronic kidney disease, stage 3 (moderate): Secondary | ICD-10-CM

## 2015-09-17 DIAGNOSIS — IMO0002 Reserved for concepts with insufficient information to code with codable children: Secondary | ICD-10-CM

## 2015-09-17 DIAGNOSIS — A0472 Enterocolitis due to Clostridium difficile, not specified as recurrent: Secondary | ICD-10-CM

## 2015-09-17 MED ORDER — INSULIN GLARGINE 100 UNIT/ML ~~LOC~~ SOLN: 45.0000 [IU] | Freq: Every day | SUBCUTANEOUS | Status: DC

## 2015-09-17 MED ORDER — HYDROCODONE-ACETAMINOPHEN 5-325 MG PO TABS: 1.0000 | ORAL_TABLET | Freq: Three times a day (TID) | ORAL | Status: DC | PRN

## 2015-09-17 NOTE — Assessment & Plan Note (Signed)
BG continue to be low. Decrease Lantus to 45units daily. (Pt has been taking 60units). Family will call if any BG less than 70. Follow up recheck in 4 weeks.

## 2015-09-17 NOTE — Patient Instructions (Addendum)
Finish current supply of Megace and then stop medication.  Decrease Lantus to 45units daily.  Finish Vancomycin to treat CDiff infection.  Follow up in 4 weeks.

## 2015-09-17 NOTE — Progress Notes (Signed)
[name], [name] ([id number]) Visit Report for 09/16/2015 Arrival Information Details Patient Name: [name], [name]. Date of Service: 09/16/2015 2:00 PM Medical Record Number: [id number] Patient Account Number: [id number] Date of Birth/Sex: [birth year] (82 y.o. Female) Treating RN: Curtis Sites Primary Care Physician: Ronna Polio Other Clinician: Referring Physician: Ronna Polio Treating Physician/Extender: Rudene Re in Treatment: 34 Visit Information History Since Last Visit Added or deleted any medications: No Patient Arrived: Wheel Chair Any new allergies or adverse reactions: No Arrival Time: 14:32 Had a fall or experienced change in No Accompanied By: self activities of daily living that may affect Transfer Assistance: None risk of falls: Patient Identification Verified: Yes Signs or symptoms of abuse/neglect since last No Secondary Verification Process Yes visito Completed: Hospitalized since last visit: No Patient Requires Transmission- No Pain Present Now: No Based Precautions: Patient Has Alerts: Yes Patient Alerts: Patient on Blood Thinner Electronic Signature(s) Signed: 09/16/2015 5:54:58 PM By: Curtis Sites Entered By: Curtis Sites on 09/16/2015 14:32:16 Confer, [name] ([id number]) -------------------------------------------------------------------------------- Clinic Level of Care Assessment Details Patient Name: [name] Date of Service: 09/16/2015 2:00 PM Medical Record Number: [id number] Patient Account Number: [id number] Date of Birth/Sex: [birth year] (82 y.o. Female) Treating RN: Curtis Sites Primary Care Physician: Ronna Polio Other Clinician: Referring Physician: Ronna Polio Treating Physician/Extender: Rudene Re in Treatment: 19 Clinic Level of Care Assessment Items TOOL 4 Quantity Score  - Use when only an EandM is performed on FOLLOW-UP visit 0 ASSESSMENTS - Nursing Assessment / Reassessment X -  Reassessment of Co-morbidities (includes updates in patient status) 1 10 X - Reassessment of Adherence to Treatment Plan 1 5 ASSESSMENTS - Wound and Skin Assessment / Reassessment X - Simple Wound Assessment / Reassessment - one wound 1 5  - Complex Wound Assessment / Reassessment - multiple wounds 0  - Dermatologic / Skin Assessment (not related to wound area) 0 ASSESSMENTS - Focused Assessment  - Circumferential Edema Measurements - multi extremities 0  - Nutritional Assessment / Counseling / Intervention 0  - Lower Extremity Assessment (monofilament, tuning fork, pulses) 0  - Peripheral Arterial Disease Assessment (using hand held doppler) 0 ASSESSMENTS - Ostomy and/or Continence Assessment and Care  - Incontinence Assessment and Management 0  - Ostomy Care Assessment and Management (repouching, etc.) 0 PROCESS - Coordination of Care X - Simple Patient / Family Education for ongoing care 1 15  - Complex (extensive) Patient / Family Education for ongoing care 0  - Staff obtains Chiropractor, Records, Test Results / Process Orders 0  - Staff telephones HHA, Nursing Homes / Clarify orders / etc 0  - Routine Transfer to another Facility (non-emergent condition) 0 [name], [name] ([id number])  - Routine Hospital Admission (non-emergent condition) 0  - New Admissions / Manufacturing engineer / Ordering NPWT, Apligraf, etc. 0  - Emergency Hospital Admission (emergent condition) 0 X - Simple Discharge Coordination 1 10  - Complex (extensive) Discharge Coordination 0 PROCESS - Special Needs  - Pediatric / Minor Patient Management 0  - Isolation Patient Management 0  - Hearing / Language / Visual special needs 0  - Assessment of Community assistance (transportation, D/C planning, etc.) 0  - Additional assistance / Altered mentation 0  - Support Surface(s) Assessment (bed, cushion, seat, etc.) 0 INTERVENTIONS - Wound Cleansing / Measurement X -  Simple Wound Cleansing - one wound 1 5  - Complex Wound Cleansing - multiple wounds 0 X - Wound Imaging (photographs - any number of wounds) 1 5  -  Wound Tracing (instead of photographs) 0 X - Simple Wound Measurement - one wound 1 5 []  - Complex Wound Measurement - multiple wounds 0 INTERVENTIONS - Wound Dressings X - Small Wound Dressing one or multiple wounds 1 10 []  - Medium Wound Dressing one or multiple wounds 0 []  - Large Wound Dressing one or multiple wounds 0 []  - Application of Medications - topical 0 []  - Application of Medications - injection 0 INTERVENTIONS - Miscellaneous []  - External ear exam 0 [name], [name] ([id number]) []  - Specimen Collection (cultures, biopsies, blood, body fluids, etc.) 0 []  - Specimen(s) / Culture(s) sent or taken to Lab for analysis 0 []  - Patient Transfer (multiple staff / Michiel Sites Lift / Similar devices) 0 []  - Simple Staple / Suture removal (25 or less) 0 []  - Complex Staple / Suture removal (26 or more) 0 []  - Hypo / Hyperglycemic Management (close monitor of Blood Glucose) 0 []  - Ankle / Brachial Index (ABI) - do not check if billed separately 0 X - Vital Signs 1 5 Has the patient been seen at the hospital within the last three years: Yes Total Score: 75 Level Of Care: New/Established - Level 2 Electronic Signature(s) Signed: 09/16/2015 5:54:58 PM By: Curtis Sites Entered By: Curtis Sites on 09/16/2015 14:44:08 [name], [name] ([id number]) -------------------------------------------------------------------------------- Encounter Discharge Information Details Patient Name: [name] Date of Service: 09/16/2015 2:00 PM Medical Record Number: [id number] Patient Account Number: [id number] Date of Birth/Sex: 13-Jan-1929 (82 y.o. Female) Treating RN: Curtis Sites Primary Care Physician: Ronna Polio Other Clinician: Referring Physician: Ronna Polio Treating Physician/Extender: Rudene Re in Treatment:  59 Encounter Discharge Information Items Discharge Pain Level: 0 Discharge Condition: Stable Ambulatory Status: Wheelchair Discharge Destination: Home Transportation: Private Auto Accompanied By: self Schedule Follow-up Appointment: Yes Medication Reconciliation completed and provided to Patient/Care No Tawona Filsinger: Provided on Clinical Summary of Care: 09/16/2015 Form Type Recipient Paper Patient CL Electronic Signature(s) Signed: 09/16/2015 3:18:20 PM By: Curtis Sites Previous Signature: 09/16/2015 2:52:20 PM Version By: Gwenlyn Perking Entered By: Curtis Sites on 09/16/2015 15:18:20 [name], [name] ([id number]) -------------------------------------------------------------------------------- Multi Wound Chart Details Patient Name: [name] Date of Service: 09/16/2015 2:00 PM Medical Record Number: [id number] Patient Account Number: [id number] Date of Birth/Sex: Mar 09, 1929 (82 y.o. Female) Treating RN: Curtis Sites Primary Care Physician: Ronna Polio Other Clinician: Referring Physician: Ronna Polio Treating Physician/Extender: Rudene Re in Treatment: 19 Vital Signs Height(in): 59 Pulse(bpm): 67 Weight(lbs): 180 Blood Pressure 157/67 (mmHg): Body Mass Index(BMI): 36 Temperature(F): Respiratory Rate 18 (breaths/min): Photos: [8:No Photos] [N/A:N/A] Wound Location: [8:Abdomen - midline - Medial] [N/A:N/A] Wounding Event: [8:Not Known] [N/A:N/A] Primary Etiology: [8:Atypical] [N/A:N/A] Comorbid History: [8:Cataracts, Hypertension, Type II Diabetes] [N/A:N/A] Date Acquired: [8:04/23/2003] [N/A:N/A] Weeks of Treatment: [8:19] [N/A:N/A] Wound Status: [8:Open] [N/A:N/A] Clustered Wound: [8:Yes] [N/A:N/A] Measurements L x W x D 0.9x0.5x0.1 [N/A:N/A] (cm) Area (cm) : [8:0.353] [N/A:N/A] Volume (cm) : [8:0.035] [N/A:N/A] % Reduction in Area: [8:6.40%] [N/A:N/A] % Reduction in Volume: 53.30% [N/A:N/A] Classification: [8:Full Thickness Without  Exposed Support Structures] [N/A:N/A] Exudate Amount: [8:Small] [N/A:N/A] Exudate Type: [8:Serous] [N/A:N/A] Exudate Color: [8:amber] [N/A:N/A] Wound Margin: [8:Flat and Intact] [N/A:N/A] Granulation Amount: [8:Small (1-33%)] [N/A:N/A] Granulation Quality: [8:Pink] [N/A:N/A] Necrotic Amount: [8:Large (67-100%)] [N/A:N/A] Exposed Structures: [8:Fascia: No Fat: No Tendon: No] [N/A:N/A] Muscle: No Joint: No Bone: No Limited to Skin Breakdown Epithelialization: None N/A N/A Periwound Skin Texture: Scarring: Yes N/A N/A Edema: No Excoriation: No Induration: No Callus: No Crepitus: No Fluctuance: No Friable: No Rash: No Periwound Skin Dry/Scaly:  Yes N/A N/A Moisture: Maceration: No Moist: No Periwound Skin Color: Atrophie Blanche: No N/A N/A Cyanosis: No Ecchymosis: No Erythema: No Hemosiderin Staining: No Mottled: No Pallor: No Rubor: No Temperature: No Abnormality N/A N/A Tenderness on No N/A N/A Palpation: Wound Preparation: Ulcer Cleansing: N/A N/A Rinsed/Irrigated with Saline Topical Anesthetic Applied: Other: lidocaine 4% Treatment Notes Electronic Signature(s) Signed: 09/16/2015 2:39:54 PM By: Curtis Sites Entered By: Curtis Sites on 09/16/2015 14:39:54 [name], [name] ([id number]) -------------------------------------------------------------------------------- Multi-Disciplinary Care Plan Details Patient Name: [name] Date of Service: 09/16/2015 2:00 PM Medical Record Number: [id number] Patient Account Number: [id number] Date of Birth/Sex: Apr 22, 1929 (82 y.o. Female) Treating RN: Curtis Sites Primary Care Physician: Ronna Polio Other Clinician: Referring Physician: Ronna Polio Treating Physician/Extender: Rudene Re in Treatment: 75 Active Inactive Abuse / Safety / Falls / Self Care Management Nursing Diagnoses: Impaired physical mobility Potential for falls Goals: Patient will remain injury free Date Initiated:  05/05/2015 Goal Status: Active Interventions: Assess fall risk on admission and as needed Notes: Wound/Skin Impairment Nursing Diagnoses: Impaired tissue integrity Goals: Ulcer/skin breakdown will have a volume reduction of 30% by week 4 Date Initiated: 05/05/2015 Goal Status: Active Ulcer/skin breakdown will have a volume reduction of 50% by week 8 Date Initiated: 05/05/2015 Goal Status: Active Ulcer/skin breakdown will have a volume reduction of 80% by week 12 Date Initiated: 05/05/2015 Goal Status: Active Ulcer/skin breakdown will heal within 14 weeks Date Initiated: 05/05/2015 Goal Status: Active Interventions: Assess ulceration(s) every visit [name], [name] ([id number]) Provide education on ulcer and skin care Notes: Electronic Signature(s) Signed: 09/16/2015 2:39:46 PM By: Curtis Sites Entered By: Curtis Sites on 09/16/2015 14:39:45 [name], [name] ([id number]) -------------------------------------------------------------------------------- Patient/Caregiver Education Details Patient Name: [name] Date of Service: 09/16/2015 2:00 PM Medical Record Number: [id number] Patient Account Number: [id number] Date of Birth/Gender: 09/23/1928 (82 y.o. Female) Treating RN: Curtis Sites Primary Care Physician: Ronna Polio Other Clinician: Referring Physician: Ronna Polio Treating Physician/Extender: Rudene Re in Treatment: 31 Education Assessment Education Provided To: Patient Education Topics Provided Wound/Skin Impairment: Handouts: Other: wound care as ordered Methods: Demonstration, Explain/Verbal Responses: State content correctly Electronic Signature(s) Signed: 09/16/2015 3:18:36 PM By: Curtis Sites Entered By: Curtis Sites on 09/16/2015 15:18:36 [name], [name] ([id number]) -------------------------------------------------------------------------------- Wound Assessment Details Patient Name: [name] Date of Service:  09/16/2015 2:00 PM Medical Record Number: [id number] Patient Account Number: [id number] Date of Birth/Sex: June 11, 1929 (82 y.o. Female) Treating RN: Curtis Sites Primary Care Physician: Ronna Polio Other Clinician: Referring Physician: Ronna Polio Treating Physician/Extender: Rudene Re in Treatment: 19 Wound Status Wound Number: 8 Primary Atypical Etiology: Wound Location: Abdomen - midline - Medial Wound Status: Open Wounding Event: Not Known Comorbid Cataracts, Hypertension, Type II Date Acquired: 04/23/2003 History: Diabetes Weeks Of Treatment: 19 Clustered Wound: Yes Photos Photo Uploaded By: Curtis Sites on 09/16/2015 16:31:31 Wound Measurements Length: (cm) 0.9 Width: (cm) 0.5 Depth: (cm) 0.1 Area: (cm) 0.353 Volume: (cm) 0.035 % Reduction in Area: 6.4% % Reduction in Volume: 53.3% Epithelialization: None Tunneling: No Undermining: No Wound Description Full Thickness Without Exposed Classification: Support Structures Wound Margin: Flat and Intact Exudate Small Amount: Exudate Type: Serous Exudate Color: amber Foul Odor After Cleansing: No Wound Bed Granulation Amount: Small (1-33%) Exposed Structure Granulation Quality: Pink Fascia Exposed: No Necrotic Amount: Large (67-100%) Fat Layer Exposed: No Larkey, Toni O. ([id number]) Necrotic Quality: Adherent Slough Tendon Exposed: No Muscle Exposed: No Joint Exposed: No Bone Exposed: No Limited to Skin Breakdown Periwound Skin Texture Texture Color No Abnormalities  Noted: No No Abnormalities Noted: No Callus: No Atrophie Blanche: No Crepitus: No Cyanosis: No Excoriation: No Ecchymosis: No Fluctuance: No Erythema: No Friable: No Hemosiderin Staining: No Induration: No Mottled: No Localized Edema: No Pallor: No Rash: No Rubor: No Scarring: Yes Temperature / Pain Moisture Temperature: No Abnormality No Abnormalities Noted: No Dry / Scaly: Yes Maceration: No Moist:  No Wound Preparation Ulcer Cleansing: Rinsed/Irrigated with Saline Topical Anesthetic Applied: Other: lidocaine 4%, Treatment Notes Wound #8 (Medial Abdomen - midline) 1. Cleansed with: Clean wound with Normal Saline 2. Anesthetic Topical Lidocaine 4% cream to wound bed prior to debridement 4. Dressing Applied: Other dressing (specify in notes) 5. Secondary Dressing Applied Bordered Foam Dressing Notes sorbact with hydrogel Electronic Signature(s) Signed: 09/16/2015 2:39:38 PM By: Curtis Sites Entered By: Curtis Sites on 09/16/2015 14:39:38 [place], [name] ([id number]) -------------------------------------------------------------------------------- Vitals Details Patient Name: [name] Date of Service: 09/16/2015 2:00 PM Medical Record Number: [id number] Patient Account Number: [id number] Date of Birth/Sex: [birth year] (82 y.o. Female) Treating RN: Curtis Sites Primary Care Physician: Ronna Polio Other Clinician: Referring Physician: Ronna Polio Treating Physician/Extender: Rudene Re in Treatment: 48 Vital Signs Time Taken: 14:35 Pulse (bpm): 67 Height (in): 59 Respiratory Rate (breaths/min): 18 Weight (lbs): 180 Blood Pressure (mmHg): 157/67 Body Mass Index (BMI): 36.4 Reference Range: 80 - 120 mg / dl Electronic Signature(s) Signed: 09/16/2015 5:54:58 PM By: Curtis Sites Entered By: Curtis Sites on 09/16/2015 14:36:00

## 2015-09-17 NOTE — Progress Notes (Signed)
Subjective:    Patient ID: [name], female    DOB: 02/19/1929, 82 y.o.   MRN: [id number]  HPI  82YO female presents for hospital follow up.  ADMISSION: 09/11/2015 DISCHARGE: 09/14/2015  DIAGNOSIS:Hypoglycemia  Family called EMS when pt noted to have altered mental status. BG was 33.  Temp in ED was 92.7 rectal. Testing of stool for CDiff was positive. She was started on Flagyl and then changed to Vancomycin. After blood sugars improved, she was started back on Lantus 22units daily.  Pt has been taking Lantus 60units daily. Recent blood sugars have been in 200s mostly, but one low of 58.  Diarrhea has been improving. Taking liquid Vancomycin. No abdominal pain. No blood in stool  Also started on Megace to help improve appetite. She reports appetite has not changed much. She is eating higher protein foods including chicken, fish, and pork. Her children prepare meals for her.   Wt Readings from Last 3 Encounters:  09/17/15 194 lb 4 oz (88.111 kg)  09/14/15 194 lb (87.998 kg)  08/03/15 187 lb (84.823 kg)   BP Readings from Last 3 Encounters:  09/17/15 109/72  09/14/15 159/77  08/03/15 149/77    Past Medical History  Diagnosis Date  . Anxiety   . Chronic diastolic CHF (congestive heart failure) (HCC)   . Depression   . Diabetes mellitus     Follow[facility] by [facility]. Tedd Sias at [facility]  . GERD (gastroesophageal reflux disease)   . Hypertension   . Hypothyroidism   . Osteoporosis   . Allergy   . Hyperlipidemia   . RLS (restless legs syndrome)   . Pancreatic disease     pancreatic failure  . Urinary incontinence   . Pancreatitis     Pancreatic resection/ open wound- [facility] 11/04  . Pneumonia     Pneumonia/ Emphysema 05/05  . 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 TIA (transient ischemic attack)     recurrent 12/15  . History of pleural empyema   . Sepsis (HCC)   . Frequent falls   . Arthritis     left knee, [facility]  .  CAD (coronary artery disease), native coronary artery      1//15  T mid-LAD  stent OM2  . Parkinson's disease (HCC)    Family History  Problem Relation Age of Onset  . Heart disease Mother   . Heart attack Mother   . Hypertension Mother   . Heart attack Father    Past Surgical History  Procedure Laterality Date  . Breast lumpectomy      Lumpectomy right breast  . 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 Abdominal hysterectomy      Hysteroscopy/ D & C ([facility]) 12/05  . Removal of pancreas  2004  . Kyphoplasty  2005  . Broken shoulder and orbital bone  2005  . Cholecystectomy  1973  . Thoracentesis    . Vaginal delivery      9  . Cardiac catheterization  08/29/2013    x1 stent @ [facility]  . Cataract surgery     Social History   Social History  . Marital Status: Widowed    Spouse Name: N/A  . Number of Children: 7  . Years of Education: 6   Occupational History  . retired- Veterinary surgeon    Social History Main Topics  . Smoking status: Never Smoker   .  Smokeless tobacco: Never Used  . Alcohol Use: No  . Drug Use: No  . Sexual Activity: Not on file   Other Topics Concern  . Not on file   Social History Narrative   Lives in home alone, has nursing aid, and alert device in [place]. Has 7 children.            Has [name], daughter, as health care POA   Not sure about DNR   Permission to speak to daughter-in-law, Kinaya Hilliker, regarding health care matters.    Review of Systems  Constitutional: Negative for fever, chills, appetite change, fatigue and unexpected weight change.  Eyes: Negative for visual disturbance.  Respiratory: Negative for cough and shortness of breath.   Cardiovascular: Negative for chest pain and leg swelling.  Gastrointestinal: Positive for diarrhea. Negative for nausea, vomiting, abdominal pain and constipation.  Musculoskeletal: Positive for myalgias, back pain and arthralgias.  Skin: Negative for  color change and rash.  Neurological: Positive for weakness.  Hematological: Negative for adenopathy. Does not bruise/bleed easily.  Psychiatric/Behavioral: Negative for sleep disturbance and dysphoric mood. The patient is not nervous/anxious.        Objective:    BP 109/72 mmHg  Pulse 98  Temp(Src) 98.2 F (36.8 C) (Oral)  Ht  (1.499 m)  Wt 194 lb 4 oz (88.111 kg)  BMI 39.21 kg/m2  SpO2 96%  LMP  (LMP Unknown) Physical Exam  Constitutional: She is oriented to person, place, and time. She appears well-developed and well-nourished. No distress.  HENT:  Head: Normocephalic and atraumatic.  Right Ear: External ear normal.  Left Ear: External ear normal.  Nose: Nose normal.  Mouth/Throat: Oropharynx is clear and moist. No oropharyngeal exudate.  Eyes: Conjunctivae are normal. Pupils are equal, round, and reactive to light. Right eye exhibits no discharge. Left eye exhibits no discharge. No scleral icterus.  Neck: Normal range of motion. Neck supple. No tracheal deviation present. No thyromegaly present.  Cardiovascular: Normal rate, regular rhythm, normal heart sounds and intact distal pulses.  Exam reveals no gallop and no friction rub.   No murmur heard. Pulmonary/Chest: Effort normal and breath sounds normal. No accessory muscle usage. No tachypnea. No respiratory distress. She has no decreased breath sounds. She has no wheezes. She has no rhonchi. She has no rales. She exhibits no tenderness.  Abdominal: Soft. Normal appearance and bowel sounds are normal. There is no tenderness.    Musculoskeletal: Normal range of motion. She exhibits no edema or tenderness.  Lymphadenopathy:    She has no cervical adenopathy.  Neurological: She is alert and oriented to person, place, and time. No cranial nerve deficit. She exhibits normal muscle tone. Coordination normal.  Skin: Skin is warm and dry. No rash noted. She is not diaphoretic. No erythema. No pallor.  Psychiatric: She has a  normal mood and affect. Her behavior is normal. Judgment and thought content normal.          Assessment & Plan:   Problem List Items Addressed This Visit      High   Diabetes type 2, controlled (HCC) - Primary (Chronic)    BG continue to be low. Decrease Lantus to 45units daily. (Pt has been taking 60units). Family will call if any BG less than 70. Follow up recheck in 4 weeks.      Relevant Medications   insulin glargine (LANTUS) 100 UNIT/ML injection     Unprioritized   C. difficile colitis    Recent admission treated for  CDiff colitis with Flagyl, but then had rash, so changed to oral Vancomycin. Diarrhea is improving. Will continue Vancomycin. Follow up with GI as scheduled.        Osteoarthrosis, unspecified whether generalized or localized, involving lower leg    Chronic pain in knees secondary to OA. Will continue prn Hydrocodone.      Relevant Medications   HYDROcodone-acetaminophen (NORCO/VICODIN) 5-325 MG tablet   Protein-calorie malnutrition (HCC)    Lab Results  Component Value Date   LABPROT 14.5 03/12/2015   During admission, apparent concern about malnutrition and protein stores. Started on Megace. Reviewed her diet today. She is tolerating full diet, high in protein. Will plan to stop Megace given potential adverse side effects.          Return in about 4 weeks (around 10/15/2015) for Recheck.

## 2015-09-17 NOTE — Progress Notes (Signed)
ILYNN, STAUFFER ([id number]) Visit Report for 09/16/2015 Chief Complaint Document Details Patient Name: [name], [name]. Date of Service: 09/16/2015 2:00 PM Medical Record Number: [id number] Patient Account Number: [id number] Date of Birth/Sex: March 19, 1929 (82 y.o. Female) Treating RN: Curtis Sites Primary Care Physician: Ronna Polio Other Clinician: Referring Physician: Ronna Polio Treating Physician/Extender: Rudene Re in Treatment: 70 Information Obtained from: Patient Chief Complaint Abdominal wall ulceration. This has been a chronic wound from a previous surgery 12 years ago.she was discharged in July 2016 and now has a recurrent problem for the last month Electronic Signature(s) Signed: 09/16/2015 2:48:40 PM By: Evlyn Kanner MD, FACS Entered By: Evlyn Kanner on 09/16/2015 14:48:39 KAELEE, PFEFFER ([id number]) -------------------------------------------------------------------------------- HPI Details Patient Name: [name] Date of Service: 09/16/2015 2:00 PM Medical Record Number: [id number] Patient Account Number: [id number] Date of Birth/Sex: May 15, 1929 (82 y.o. Female) Treating RN: Curtis Sites Primary Care Physician: Ronna Polio Other Clinician: Referring Physician: Ronna Polio Treating Physician/Extender: Rudene Re in Treatment: 72 History of Present Illness Location: ulceration on the epigastric region Quality: Patient reports No Pain. Severity: Patient states wound (s) are getting better. Duration: Patient has had the wound for < 4 weeks prior to presenting for treatment Context: The wound occurred when the patient started having some profuse bleeding from the wound and was admitted to the hospital and they noticed that the bleeding had stopped. Associated Signs and Symptoms: Wound has minimal bloody drainage HPI Description: Pleasant 82 year old with h/o ex lap for pancreatitis, DM, CHF. returns to clinic for evaluation  of her open abdominal wound. She has been applying silver alginate. She is without complaints today. No significant pain. No drainage. No fever or chills. Tolerating a regular diet and having regular bowel movements. 06/02/2015 -- patient has had multiple areas which are now draining fluid and form an eschar. 09/16/2015 -- was admitted to specialty recently with a lower GI infection and has been treated appropriately. Still feels very weak. Electronic Signature(s) Signed: 09/16/2015 2:49:14 PM By: Evlyn Kanner MD, FACS Entered By: Evlyn Kanner on 09/16/2015 14:49:13 ESTHELA, BRANDNER ([id number]) -------------------------------------------------------------------------------- Physical Exam Details Patient Name: [name] Date of Service: 09/16/2015 2:00 PM Medical Record Number: [id number] Patient Account Number: [id number] Date of Birth/Sex: Apr 21, 1929 (82 y.o. Female) Treating RN: Curtis Sites Primary Care Physician: Ronna Polio Other Clinician: Referring Physician: Ronna Polio Treating Physician/Extender: Rudene Re in Treatment: 19 Constitutional . Pulse regular. Respirations normal and unlabored. Afebrile. . Eyes Nonicteric. Reactive to light. Ears, Nose, Mouth, and Throat Lips, teeth, and gums WNL.Marland Kitchen Moist mucosa without lesions. Neck supple and nontender. No palpable supraclavicular or cervical adenopathy. Normal sized without goiter. Respiratory WNL. No retractions.. Cardiovascular Pedal Pulses WNL. No clubbing, cyanosis or edema. Chest Breasts symmetical and no nipple discharge.. Breast tissue WNL, no masses, lumps, or tenderness.. Lymphatic No adneopathy. No adenopathy. No adenopathy. Musculoskeletal Adexa without tenderness or enlargement.. Digits and nails w/o clubbing, cyanosis, infection, petechiae, ischemia, or inflammatory conditions.. Integumentary (Hair, Skin) No suspicious lesions. No crepitus or fluctuance. No peri-wound warmth or  erythema. No masses.Marland Kitchen Psychiatric Judgement and insight Intact.. No evidence of depression, anxiety, or agitation.. Notes the area previously ulcerated when she was seen last a month ago is now looking excellent and has been well-healed Electronic Signature(s) Signed: 09/16/2015 2:49:46 PM By: Evlyn Kanner MD, FACS Entered By: Evlyn Kanner on 09/16/2015 14:49:45 Sangalang, [name] ([id number]) -------------------------------------------------------------------------------- Physician Orders Details Patient Name: [name] Date of Service: 09/16/2015 2:00 PM Medical Record Number: [id number]  Patient Account Number: [id number] Date of Birth/Sex: 09-03-1928 (82 y.o. Female) Treating RN: Curtis Sites Primary Care Physician: Ronna Polio Other Clinician: Referring Physician: Ronna Polio Treating Physician/Extender: Rudene Re in Treatment: 51 Verbal / Phone Orders: Yes Clinician: Curtis Sites Read Back and Verified: Yes Diagnosis Coding Wound Cleansing Wound #8 Medial Abdomen - midline o Clean wound with Normal Saline. Anesthetic Wound #8 Medial Abdomen - midline o Topical Lidocaine 4% cream applied to wound bed prior to debridement Skin Barriers/Peri-Wound Care Wound #8 Medial Abdomen - midline o Skin Prep Primary Wound Dressing Wound #8 Medial Abdomen - midline o Other: - Sorbact with Hydrogel Secondary Dressing Wound #8 Medial Abdomen - midline o Boardered Foam Dressing Dressing Change Frequency Wound #8 Medial Abdomen - midline o Change Dressing Monday, Wednesday, Friday Follow-up Appointments Wound #8 Medial Abdomen - midline o Return Appointment in 2 weeks. - or as she is able Home Health Wound #8 Medial Abdomen - midline o Continue Home Health Visits - [facility] o Home Health Nurse may visit PRN to address patientos wound care needs. o FACE TO FACE ENCOUNTER: MEDICARE and MEDICAID PATIENTS: I certify that this patient  is under my care and that I had a face-to-face encounter that meets the physician face-to-face encounter requirements with this patient on this date. The encounter with the patient was in [place], [place]. ([id number]) whole or in part for the following MEDICAL CONDITION: (primary reason for Home Healthcare) MEDICAL NECESSITY: I certify, that based on my findings, NURSING services are a medically necessary home health service. HOME BOUND STATUS: I certify that my clinical findings support that this patient is homebound (i.e., Due to illness or injury, pt requires aid of supportive devices such as crutches, cane, wheelchairs, walkers, the use of special transportation or the assistance of another person to leave their place of residence. There is a normal inability to leave the home and doing so requires considerable and taxing effort. Other absences are for medical reasons / religious services and are infrequent or of short duration when for other reasons). o If current dressing causes regression in wound condition, may D/C ordered dressing product/s and apply Normal Saline Moist Dressing daily until next Wound Healing Center / Other MD appointment. Notify Wound Healing Center of regression in wound condition at [phone]. o Please direct any NON-WOUND related issues/requests for orders to patient's Primary Care Physician Electronic Signature(s) Signed: 09/16/2015 4:12:51 PM By: Evlyn Kanner MD, FACS Signed: 09/16/2015 5:54:58 PM By: Curtis Sites Entered By: Curtis Sites on 09/16/2015 14:43:32 Bossard, [name] ([id number]) -------------------------------------------------------------------------------- Problem List Details Patient Name: MICHELE, JUDY. Date of Service: 09/16/2015 2:00 PM Medical Record Number: [id number] Patient Account Number: [id number] Date of Birth/Sex: 10/02/1928 (82 y.o. Female) Treating RN: Curtis Sites Primary Care Physician: Ronna Polio Other  Clinician: Referring Physician: Ronna Polio Treating Physician/Extender: Rudene Re in Treatment: 52 Active Problems ICD-10 Encounter Code Description Active Date Diagnosis E11.622 Type 2 diabetes mellitus with other skin ulcer 05/05/2015 Yes S31.102S Unspecified open wound of abdominal wall, epigastric 05/05/2015 Yes region without penetration into peritoneal cavity, sequela E66.09 Other obesity due to excess calories 05/05/2015 Yes Inactive Problems Resolved Problems Electronic Signature(s) Signed: 09/16/2015 2:48:32 PM By: Evlyn Kanner MD, FACS Entered By: Evlyn Kanner on 09/16/2015 14:48:32 Yuhasz, [name] ([id number]) -------------------------------------------------------------------------------- Progress Note Details Patient Name: [name] Date of Service: 09/16/2015 2:00 PM Medical Record Number: [id number] Patient Account Number: [id number] Date of Birth/Sex: June 24, 1929 (82 y.o. Female) Treating RN: Curtis Sites Primary Care Physician:  Ronna Polio Other Clinician: Referring Physician: Ronna Polio Treating Physician/Extender: Rudene Re in Treatment: 42 Subjective Chief Complaint Information obtained from Patient Abdominal wall ulceration. This has been a chronic wound from a previous surgery 12 years ago.she was discharged in July 2016 and now has a recurrent problem for the last month History of Present Illness (HPI) The following HPI elements were documented for the patient's wound: Location: ulceration on the epigastric region Quality: Patient reports No Pain. Severity: Patient states wound (s) are getting better. Duration: Patient has had the wound for < 4 weeks prior to presenting for treatment Context: The wound occurred when the patient started having some profuse bleeding from the wound and was admitted to the hospital and they noticed that the bleeding had stopped. Associated Signs and Symptoms: Wound has minimal bloody  drainage Pleasant 82 year old with h/o ex lap for pancreatitis, DM, CHF. returns to clinic for evaluation of her open abdominal wound. She has been applying silver alginate. She is without complaints today. No significant pain. No drainage. No fever or chills. Tolerating a regular diet and having regular bowel movements. 06/02/2015 -- patient has had multiple areas which are now draining fluid and form an eschar. 09/16/2015 -- was admitted to specialty recently with a lower GI infection and has been treated appropriately. Still feels very weak. Objective Constitutional Pulse regular. Respirations normal and unlabored. Afebrile. Vitals Time Taken: 2:35 PM, Height: 59 in, Weight: 180 lbs, BMI: 36.4, Pulse: 67 bpm, Respiratory Rate: 18 breaths/min, Blood Pressure: 157/67 mmHg. KIMANH, TEMPLEMAN ([id number]) Eyes Nonicteric. Reactive to light. Ears, Nose, Mouth, and Throat Lips, teeth, and gums WNL.Marland Kitchen Moist mucosa without lesions. Neck supple and nontender. No palpable supraclavicular or cervical adenopathy. Normal sized without goiter. Respiratory WNL. No retractions.. Cardiovascular Pedal Pulses WNL. No clubbing, cyanosis or edema. Chest Breasts symmetical and no nipple discharge.. Breast tissue WNL, no masses, lumps, or tenderness.. Lymphatic No adneopathy. No adenopathy. No adenopathy. Musculoskeletal Adexa without tenderness or enlargement.. Digits and nails w/o clubbing, cyanosis, infection, petechiae, ischemia, or inflammatory conditions.Marland Kitchen Psychiatric Judgement and insight Intact.. No evidence of depression, anxiety, or agitation.. General Notes: the area previously ulcerated when she was seen last a month ago is now looking excellent and has been well-healed Integumentary (Hair, Skin) No suspicious lesions. No crepitus or fluctuance. No peri-wound warmth or erythema. No masses.. Wound #8 status is Open. Original cause of wound was Not Known. The wound is located on the  Medial Abdomen - midline. The wound measures 0.9cm length x 0.5cm width x 0.1cm depth; 0.353cm^2 area and 0.035cm^3 volume. The wound is limited to skin breakdown. There is no tunneling or undermining noted. There is a small amount of serous drainage noted. The wound margin is flat and intact. There is small (1-33%) pink granulation within the wound bed. There is a large (67-100%) amount of necrotic tissue within the wound bed including Adherent Slough. The periwound skin appearance exhibited: Scarring, Dry/Scaly. The periwound skin appearance did not exhibit: Callus, Crepitus, Excoriation, Fluctuance, Friable, Induration, Localized Edema, Rash, Maceration, Moist, Atrophie Blanche, Cyanosis, Ecchymosis, Hemosiderin Staining, Mottled, Pallor, Rubor, Erythema. Periwound temperature was noted as No Abnormality. Assessment GIANNINA, BARTOLOME ([id number]) Active Problems ICD-10 E11.622 - Type 2 diabetes mellitus with other skin ulcer S31.102S - Unspecified open wound of abdominal wall, epigastric region without penetration into peritoneal cavity, sequela E66.09 - Other obesity due to excess calories Plan Wound Cleansing: Wound #8 Medial Abdomen - midline: Clean wound with Normal Saline. Anesthetic: Wound #8 Medial Abdomen -  midline: Topical Lidocaine 4% cream applied to wound bed prior to debridement Skin Barriers/Peri-Wound Care: Wound #8 Medial Abdomen - midline: Skin Prep Primary Wound Dressing: Wound #8 Medial Abdomen - midline: Other: - Sorbact with Hydrogel Secondary Dressing: Wound #8 Medial Abdomen - midline: Boardered Foam Dressing Dressing Change Frequency: Wound #8 Medial Abdomen - midline: Change Dressing Monday, Wednesday, Friday Follow-up Appointments: Wound #8 Medial Abdomen - midline: Return Appointment in 2 weeks. - or as she is able Home Health: Wound #8 Medial Abdomen - midline: Continue Home Health Visits - [facility] Nurse may visit PRN to address  patient s wound care needs. FACE TO FACE ENCOUNTER: MEDICARE and MEDICAID PATIENTS: I certify that this patient is under my care and that I had a face-to-face encounter that meets the physician face-to-face encounter requirements with this patient on this date. The encounter with the patient was in whole or in part for the following MEDICAL CONDITION: (primary reason for Home Healthcare) MEDICAL NECESSITY: I certify, that based on my findings, NURSING services are a medically necessary home health service. HOME BOUND STATUS: I certify that my clinical findings support that this patient is homebound (i.e., Due to illness or injury, pt requires aid of supportive devices such as crutches, cane, wheelchairs, walkers, the use of special transportation or the assistance of another person to leave their place of residence. There is a normal inability to leave the home and doing so requires considerable and taxing effort. Other absences are ANDREW, SORIA ([id number]) for medical reasons / religious services and are infrequent or of short duration when for other reasons). If current dressing causes regression in wound condition, may D/C ordered dressing product/s and apply Normal Saline Moist Dressing daily until next Wound Healing Center / Other MD appointment. Notify Wound Healing Center of regression in wound condition at [phone]. Please direct any NON-WOUND related issues/requests for orders to patient's Primary Care Physician I have recommended the change to Sorbact and hydrogel to be applied to the wound with a bordered foam dressing and this is to be changed every other day. I have asked her to come to see as more often as she comes about once a month. Electronic Signature(s) Signed: 09/16/2015 2:50:11 PM By: Evlyn Kanner MD, FACS Entered By: Evlyn Kanner on 09/16/2015 14:50:11 KRISHIKA, BUGGE  ([id number]) -------------------------------------------------------------------------------- SuperBill Details Patient Name: [name] Date of Service: 09/16/2015 Medical Record Number: [id number] Patient Account Number: [id number] Date of Birth/Sex: 05-Dec-1928 (82 y.o. Female) Treating RN: Curtis Sites Primary Care Physician: Ronna Polio Other Clinician: Referring Physician: Ronna Polio Treating Physician/Extender: Rudene Re in Treatment: 19 Diagnosis Coding ICD-10 Codes Code Description E11.622 Type 2 diabetes mellitus with other skin ulcer Unspecified open wound of abdominal wall, epigastric region without penetration into S31.102S peritoneal cavity, sequela E66.09 Other obesity due to excess calories Facility Procedures CPT4 Code: [id number] Description: [phone] - WOUND CARE VISIT-LEV 2 EST PT Modifier: Quantity: 1 Physician Procedures CPT4: Description Modifier Quantity Code [id number] - WC PHYS LEVEL 3 - EST PT 1 ICD-10 Description Diagnosis E11.622 Type 2 diabetes mellitus with other skin ulcer S31.102S Unspecified open wound of abdominal wall, epigastric region without  penetration into peritoneal cavity, sequela E66.09 Other obesity due to excess calories Electronic Signature(s) Signed: 09/16/2015 2:50:23 PM By: Evlyn Kanner MD, FACS Entered By: Evlyn Kanner on 09/16/2015 14:50:23

## 2015-09-17 NOTE — Assessment & Plan Note (Signed)
Recent admission treated for CDiff colitis with Flagyl, but then had rash, so changed to oral Vancomycin. Diarrhea is improving. Will continue Vancomycin. Follow up with GI as scheduled.

## 2015-09-17 NOTE — Assessment & Plan Note (Signed)
Lab Results  Component Value Date   LABPROT 14.5 03/12/2015   During admission, apparent concern about malnutrition and protein stores. Started on Megace. Reviewed her diet today. She is tolerating full diet, high in protein. Will plan to stop Megace given potential adverse side effects.

## 2015-09-17 NOTE — Assessment & Plan Note (Signed)
Chronic pain in knees secondary to OA. Will continue prn Hydrocodone.

## 2015-09-17 NOTE — Progress Notes (Signed)
Pre visit review using our clinic review tool, if applicable. No additional management support is needed unless otherwise documented below in the visit note. 

## 2015-09-20 NOTE — Discharge Summary (Signed)
[facility] - [place] at [facility]   PATIENT NAME: [name]    MR#:  [id number]  DATE OF BIRTH:  December 09, 1928  DATE OF ADMISSION:  09/11/2015 ADMITTING PHYSICIAN: Milagros Loll, MD  DATE OF DISCHARGE: 09/14/2015  3:40 PM  PRIMARY CARE PHYSICIAN: Wynona Dove, MD     ADMISSION DIAGNOSIS:  Hypoglycemia [E16.2] Abdominal pain [R10.9] Hypothermia, initial encounter [T68.XXXA]  DISCHARGE DIAGNOSIS:  Principal Problem:   Hypoglycemia Active Problems:   Failure to thrive in adult   C. difficile colitis   Thrombocytopenia (HCC)   Hypothermia   Drug eruption   Generalized weakness   SECONDARY DIAGNOSIS:   Past Medical History  Diagnosis Date  . Anxiety   . Chronic diastolic CHF (congestive heart failure) (HCC)   . Depression   . Diabetes mellitus     Followed by Dr. Tedd Sias at [facility]  . GERD (gastroesophageal reflux disease)   . Hypertension   . Hypothyroidism   . Osteoporosis   . Allergy   . Hyperlipidemia   . RLS (restless legs syndrome)   . Pancreatic disease     pancreatic failure  . Urinary incontinence   . Pancreatitis     Pancreatic resection/ open wound- [facility] 11/04  . Pneumonia     Pneumonia/ Emphysema 05/05  . 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 TIA (transient ischemic attack)     recurrent 12/15  . History of pleural empyema   . Sepsis (HCC)   . Frequent falls   . Arthritis     left knee, [facility]  . CAD (coronary artery disease), native coronary artery      1//15  T mid-LAD  stent OM2  . Parkinson's disease (HCC)     .pro HOSPITAL COURSE:  The patient is a 82 year old Caucasian female who presents to the hospital with poor oral intake, diarrhea and hypoglycemia. Patient's C. difficile antigen was positive, however, C. difficile toxin is negative. Patient was started on  Flagyl orally, but developed rash on the chest as well as abdomen . Suspected Flagyl was  discontinued and oral vancomycin was initiated.  Patient was initiated on vancomycin orally and improved. She was evaluated by physical therapist and recommended rehabilitation placement, but refused, home health services were arranged for her upon discharge home.  Discussion by problem: #1. Hypoglycemia in diabetic,  insulin Lantus was discontinued due to unpredictable oral intake ranging from 0 to 100%, continue  Accu-Cheks and sliding scale insulin only. #2 C. difficile enterocolitis, continue  vancomycin orally to complete 14 day course, continue florastor, follow clinically.  #3. Thrombocytopenia, platelet count has improved #4. Hypothermia, resolved #5 rash, etiology is unclear, possibly related to Flagyl/drug eruption, Flagyl was discontinued and patient was given steroids intravenously. Improved clinically  DISCHARGE CONDITIONS:   stable  CONSULTS OBTAINED:     DRUG ALLERGIES:   Allergies  Allergen Reactions  . Citalopram Hydrobromide Nausea And Vomiting and Other (See Comments)    Reaction:  Dizziness and dry mouth   . Diphenhydramine Other (See Comments)    Reaction:  Unknown   . Metoclopramide Other (See Comments)    Reaction:  Unknown   . Paroxetine Other (See Comments)    Reaction:  Hallucinations   . Tape Rash    DISCHARGE MEDICATIONS:   Discharge Medication List as of 09/14/2015  3:12 PM    START taking these medications   Details  vancomycin (VANCOCIN) 50 mg/mL  oral solution Take 5 mLs (250 mg total) by mouth every 6 (six) hours., Starting 09/14/2015, Until Discontinued, Normal    megestrol (MEGACE) 400 MG/10ML suspension Take 10 mLs (400 mg total) by mouth 2 (two) times daily., Starting 09/14/2015, Until Discontinued, Normal      CONTINUE these medications which have CHANGED   Details  ondansetron (ZOFRAN) 4 MG tablet Take 1 tablet (4 mg total) by mouth every 4 (four) hours as needed for nausea or vomiting., Starting 09/14/2015, Until Discontinued, Normal     insulin glargine (LANTUS) 100 UNIT/ML injection Inject 0.22 mLs (22 Units total) into the skin at bedtime., Starting 09/14/2015, Until Discontinued, Normal      CONTINUE these medications which have NOT CHANGED   Details  albuterol (PROVENTIL HFA;VENTOLIN HFA) 108 (90 BASE) MCG/ACT inhaler Inhale 2 puffs into the lungs every 6 (six) hours as needed for wheezing or shortness of breath., Starting 08/03/2015, Until Discontinued, Normal    ALPRAZolam (XANAX) 1 MG tablet Take 0.5-1 mg by mouth 3 (three) times daily as needed for anxiety. Pt take one-half tablet in the morning, one-half tablet at lunch, and one tablet at bedtime if needed for anxiety., Until Discontinued, Historical Med    amLODipine (NORVASC) 5 MG tablet Take 1 tablet (5 mg total) by mouth daily., Starting 07/07/2015, Until Discontinued, Normal    budesonide (ENTOCORT EC) 3 MG 24 hr capsule Take 9 mg by mouth daily. , Until Discontinued, Historical Med    busPIRone (BUSPAR) 10 MG tablet Take 10 mg by mouth 2 (two) times daily., Until Discontinued, Historical Med    carbidopa-levodopa (SINEMET IR) 25-100 MG tablet Take 2 tablets by mouth 2 (two) times daily., Until Discontinued, Historical Med    cetirizine (ZYRTEC) 10 MG tablet Take 10 mg by mouth daily as needed for allergies. , Until Discontinued, Historical Med    clopidogrel (PLAVIX) 75 MG tablet Take 1 tablet (75 mg total) by mouth daily., Starting 03/14/2015, Until Discontinued, Normal    cyanocobalamin (,VITAMIN B-12,) 1000 MCG/ML injection Inject 1 mL (1,000 mcg total) into the muscle every 30 (thirty) days., Starting 06/01/2015, Until Discontinued, Normal    fluticasone (FLONASE) 50 MCG/ACT nasal spray Place 2 sprays into both nostrils daily as needed for rhinitis. , Until Discontinued, Historical Med    furosemide (LASIX) 40 MG tablet Take 1 tablet (40 mg total) by mouth daily., Starting 12/02/2014, Until Discontinued, Normal    insulin lispro (HUMALOG) 100 UNIT/ML  injection Inject 8-18 Units into the skin 3 (three) times daily. Pt uses per sliding scale., Until Discontinued, Historical Med    levothyroxine (SYNTHROID, LEVOTHROID) 75 MCG tablet Take 75 mcg by mouth daily., Until Discontinued, Historical Med    metoprolol (TOPROL-XL) 200 MG 24 hr tablet Take 200 mg by mouth daily. , Until Discontinued, Historical Med    Multiple Vitamin (MULTIVITAMIN WITH MINERALS) TABS tablet Take 1 tablet by mouth daily., Until Discontinued, Historical Med    Pancrelipase, Lip-Prot-Amyl, (ZENPEP) 15000 units CPEP Take 15,000 Units by mouth 3 (three) times daily with meals., Until Discontinued, Historical Med    pravastatin (PRAVACHOL) 40 MG tablet Take 40 mg by mouth daily. , Until Discontinued, Historical Med    saccharomyces boulardii (FLORASTOR) 250 MG capsule Take 250 mg by mouth daily., Until Discontinued, Historical Med    HYDROcodone-acetaminophen (NORCO/VICODIN) 5-325 MG tablet Take 1-2 tablets by mouth 3 (three) times daily as needed for moderate pain., Starting 08/20/2015, Until Discontinued, Print    omeprazole (PRILOSEC) 20 MG capsule Take 1  capsule (20 mg total) by mouth daily., Starting 06/01/2015, Until Discontinued, Normal    tiZANidine (ZANAFLEX) 4 MG tablet Take 1 tablet (4 mg total) by mouth 3 (three) times daily., Starting 05/17/2015, Until Mon 05/16/16, Print      STOP taking these medications     cephALEXin (KEFLEX) 500 MG capsule          DISCHARGE INSTRUCTIONS:    The patient is to follow up with PCP  If you experience worsening of your admission symptoms, develop shortness of breath, life threatening emergency, suicidal or homicidal thoughts you must seek medical attention immediately by calling 911 or calling your MD immediately  if symptoms less severe.  You Must read complete instructions/literature along with all the possible adverse reactions/side effects for all the Medicines you take and that have been prescribed to you. Take  any new Medicines after you have completely understood and accept all the possible adverse reactions/side effects.   Please note  You were cared for by a hospitalist during your hospital stay. If you have any questions about your discharge medications or the care you received while you were in the hospital after you are discharged, you can call the unit and asked to speak with the hospitalist on call if the hospitalist that took care of you is not available. Once you are discharged, your primary care physician will handle any further medical issues. Please note that NO REFILLS for any discharge medications will be authorized once you are discharged, as it is imperative that you return to your primary care physician (or establish a relationship with a primary care physician if you do not have one) for your aftercare needs so that they can reassess your need for medications and monitor your lab values.    Today   CHIEF COMPLAINT:   Chief Complaint  Patient presents with  . Hypoglycemia    HISTORY OF PRESENT ILLNESS:  Saleen Peden  is a 82 y.o. female with a known history of DM, CHF, HTN, hyperlipidemia, who presents to the hospital with poor oral intake, diarrhea and hypoglycemia. Patient's C. difficile antigen was positive, however, C. difficile toxin is negative. Patient was started on  Flagyl orally, but developed rash on the chest as well as abdomen . Suspected Flagyl was discontinued and oral vancomycin was initiated.  Patient was initiated on vancomycin orally and improved. She was evaluated by physical therapist and recommended rehabilitation placement, but refused, home health services were arranged for her upon discharge home.  Discussion by problem: #1. Hypoglycemia in diabetic,  insulin Lantus was discontinued due to unpredictable oral intake ranging from 0 to 100%, continue  Accu-Cheks and sliding scale insulin only. #2 C. difficile enterocolitis, continue  vancomycin orally to complete  14 day course, continue florastor, follow clinically.  #3. Thrombocytopenia, platelet count has improved #4. Hypothermia, resolved #5 rash, etiology is unclear, possibly related to Flagyl/drug eruption, Flagyl was discontinued and patient was given steroids intravenously. Improved clinically    VITAL SIGNS:  Blood pressure 159/77, pulse 69, temperature 98 F (36.7 C), temperature source Oral, resp. rate 18, height  (1.499 m), weight 87.998 kg (194 lb), SpO2 92 %.  I/O:  No intake or output data in the 24 hours ending 09/20/15 0001  PHYSICAL EXAMINATION:  GENERAL:  82 y.o.-year-old patient lying in the bed with no acute distress.  EYES: Pupils equal, round, reactive to light and accommodation. No scleral icterus. Extraocular muscles intact.  HEENT: Head atraumatic, normocephalic. Oropharynx and nasopharynx clear.  NECK:  Supple, no jugular venous distention. No thyroid enlargement, no tenderness.  LUNGS: Normal breath sounds bilaterally, no wheezing, rales,rhonchi or crepitation. No use of accessory muscles of respiration.  CARDIOVASCULAR: S1, S2 normal. No murmurs, rubs, or gallops.  ABDOMEN: Soft, non-tender, non-distended. Bowel sounds present. No organomegaly or mass.  EXTREMITIES: No pedal edema, cyanosis, or clubbing.  NEUROLOGIC: Cranial nerves II through XII are intact. Muscle strength 5/5 in all extremities. Sensation intact. Gait not checked.  PSYCHIATRIC: The patient is alert and oriented x 3.  SKIN: faint confluent erythematous rash  on abdomen  DATA REVIEW:   CBC  Recent Labs Lab 09/13/15 1447  PLT 122*    Chemistries   Recent Labs Lab 09/13/15 1447  NA 140  K 5.1  CL 104  CO2 28  GLUCOSE 269*  BUN 13  CREATININE 0.89  CALCIUM 9.0    Cardiac Enzymes No results for input(s): TROPONINI in the last 168 hours.  Microbiology Results  Results for orders placed or performed during the hospital encounter of 09/11/15  Culture, blood (Routine X 2) w  Reflex to ID Panel     Status: None   Collection Time: 09/11/15 12:50 PM  Result Value Ref Range Status   Specimen Description BLOOD LEFT ANTECUBITAL  Final   Special Requests BOTTLES DRAWN AEROBIC AND ANAEROBIC  3CC  Final   Culture NO GROWTH 5 DAYS  Final   Report Status 09/16/2015 FINAL  Final  Culture, blood (Routine X 2) w Reflex to ID Panel     Status: None   Collection Time: 09/11/15  1:52 PM  Result Value Ref Range Status   Specimen Description BLOOD LEFT ANTECUBITAL  Final   Special Requests BOTTLES DRAWN AEROBIC AND ANAEROBIC  4CC  Final   Culture NO GROWTH 5 DAYS  Final   Report Status 09/16/2015 FINAL  Final  C difficile quick scan w PCR reflex     Status: Abnormal   Collection Time: 09/11/15  1:52 PM  Result Value Ref Range Status   C Diff antigen POSITIVE (A) NEGATIVE Final   C Diff toxin NEGATIVE NEGATIVE Final   C Diff interpretation   Final    Negative for toxigenic C. difficile. Toxin gene and active toxin production not detected. May be a nontoxigenic strain of C. difficile bacteria present, lacking the ability to produce toxin.  Gastrointestinal Panel by PCR , Stool     Status: None   Collection Time: 09/11/15  1:52 PM  Result Value Ref Range Status   Campylobacter species NOT DETECTED NOT DETECTED Final   Plesimonas shigelloides NOT DETECTED NOT DETECTED Final   Salmonella species NOT DETECTED NOT DETECTED Final   Yersinia enterocolitica NOT DETECTED NOT DETECTED Final   Vibrio species NOT DETECTED NOT DETECTED Final   Vibrio cholerae NOT DETECTED NOT DETECTED Final   Enteroaggregative E coli (EAEC) NOT DETECTED NOT DETECTED Final   Enteropathogenic E coli (EPEC) NOT DETECTED NOT DETECTED Final   Enterotoxigenic E coli (ETEC) NOT DETECTED NOT DETECTED Final   Shiga like toxin producing E coli (STEC) NOT DETECTED NOT DETECTED Final   E. coli O157 NOT DETECTED NOT DETECTED Final   Shigella/Enteroinvasive E coli (EIEC) NOT DETECTED NOT DETECTED Final    Cryptosporidium NOT DETECTED NOT DETECTED Final   Cyclospora cayetanensis NOT DETECTED NOT DETECTED Final   Entamoeba histolytica NOT DETECTED NOT DETECTED Final   Giardia lamblia NOT DETECTED NOT DETECTED Final   Adenovirus F40/41 NOT DETECTED NOT DETECTED Final  Astrovirus NOT DETECTED NOT DETECTED Final   Norovirus GI/GII NOT DETECTED NOT DETECTED Final   Rotavirus A NOT DETECTED NOT DETECTED Final   Sapovirus (I, II, IV, and V) NOT DETECTED NOT DETECTED Final  Clostridium Difficile by PCR     Status: None   Collection Time: 09/11/15  1:52 PM  Result Value Ref Range Status   Toxigenic C Difficile by pcr NEGATIVE NEGATIVE Final  Urine culture     Status: None   Collection Time: 09/13/15 12:17 PM  Result Value Ref Range Status   Specimen Description URINE, CATHETERIZED  Final   Special Requests Normal  Final   Culture   Final    MULTIPLE SPECIES PRESENT, SUGGEST RECOLLECTION Probably contamination with fecal flora.    Report Status 09/14/2015 FINAL  Final    RADIOLOGY:  No results found.  EKG:   Orders placed or performed during the hospital encounter of 09/11/15  . EKG 12-Lead  . EKG 12-Lead  . ED EKG  . ED EKG  . EKG      Management plans discussed with the patient, family and they are in agreement.  CODE STATUS:  Code Status History    Date Active Date Inactive Code Status Order ID Comments User Context   09/11/2015  2:29 PM 09/14/2015  6:52 PM Full Code [id number]  Milagros Loll, MD ED   03/12/2015  2:30 PM 03/14/2015  2:54 PM DNR [id number]  Shaune Pollack, MD Inpatient   02/12/2015  4:25 AM 02/13/2015  6:20 PM DNR [id number]  Oralia Manis, MD Inpatient      TOTAL TIME TAKING CARE OF THIS PATIENT: 40 minutes.    Katharina Caper M.D on 09/20/2015 at 12:01 AM  Between 7am to 6pm - Pager - [phone]  After 6pm go to [email or web address] - password [email or web address] [facility]  [place] [facility]  Office  [phone]  CC: Primary care physician; Wynona Dove,  MD

## 2015-09-26 ENCOUNTER — Other Ambulatory Visit: Payer: Self-pay | Admitting: Internal Medicine

## 2015-09-28 ENCOUNTER — Telehealth: Payer: Self-pay

## 2015-09-28 DIAGNOSIS — E11649 Type 2 diabetes mellitus with hypoglycemia without coma: Secondary | ICD-10-CM

## 2015-09-28 DIAGNOSIS — T8189XD Other complications of procedures, not elsewhere classified, subsequent encounter: Secondary | ICD-10-CM

## 2015-09-28 DIAGNOSIS — A047 Enterocolitis due to Clostridium difficile: Secondary | ICD-10-CM | POA: Diagnosis not present

## 2015-09-28 DIAGNOSIS — I2510 Atherosclerotic heart disease of native coronary artery without angina pectoris: Secondary | ICD-10-CM

## 2015-09-28 NOTE — Telephone Encounter (Signed)
The Vancomycin was used to treat diarrhea from [facility]. If she is still having diarrhea, she needs to follow up with GI asap.

## 2015-09-28 NOTE — Telephone Encounter (Signed)
Sherry Milds from [facility] called and stated that Ms Conkright is having diarrhea from the course of Vancomycin she started. Cordelia Pen wants to know if it is okay for Ms. Chismar to use an over the counter medication for the diarrhea. Please advise

## 2015-09-28 NOTE — Telephone Encounter (Signed)
Spoke with [name] at [facility], explained that the vancomycin was the treatment for her diarrhea and to have her follow up with GI, she will speak with the family to get an appt with GI asap.  Thanks

## 2015-10-07 ENCOUNTER — Other Ambulatory Visit: Payer: Self-pay | Admitting: Internal Medicine

## 2015-10-08 NOTE — Telephone Encounter (Signed)
Last fill by you on 06/04/15... Okay to refill?

## 2015-10-09 ENCOUNTER — Telehealth: Payer: Self-pay | Admitting: *Deleted

## 2015-10-09 NOTE — Telephone Encounter (Signed)
Patient had AF episode in 04/2015.  At the time, Dr. Graciela Husbands did not recommend anticoagulation due to a GI bleed and subsequent hospitalization in 02/2015.  Dr. Graciela Husbands would now like to see patient in office to discuss anticoagulation and AF management plan.  Tentatively scheduled patient for appointment with Dr. Graciela Husbands on 10/15/15, will try to reach patient/family to offer appointment and discuss results of loop recorder.  Able to reach patient, but she is unable to hear me well enough over the phone.  Offered to call her daughter and she is agreeable.  LMOVM for both ECs listed in chart [place] [facility] [place]) requesting call back.  Gave device clinic phone number.

## 2015-10-12 ENCOUNTER — Ambulatory Visit (INDEPENDENT_AMBULATORY_CARE_PROVIDER_SITE_OTHER): Payer: Medicare Other | Admitting: *Deleted

## 2015-10-12 DIAGNOSIS — I638 Other cerebral infarction: Secondary | ICD-10-CM

## 2015-10-12 DIAGNOSIS — I6389 Other cerebral infarction: Secondary | ICD-10-CM

## 2015-10-12 NOTE — Progress Notes (Signed)
[facility] Summary Report / Loop Recorder 

## 2015-10-13 NOTE — Telephone Encounter (Signed)
Ms. Earl Many made aware of findings and recent decision to discuss AF/anticoagulation. She verbalizes understanding. Appt confirmed for Thursday 10/15/15 at 3:15.

## 2015-10-14 ENCOUNTER — Ambulatory Visit: Payer: Self-pay | Admitting: Internal Medicine

## 2015-10-15 ENCOUNTER — Telehealth: Payer: Self-pay | Admitting: Internal Medicine

## 2015-10-15 ENCOUNTER — Encounter: Payer: Medicare Other | Admitting: Internal Medicine

## 2015-10-15 NOTE — Telephone Encounter (Signed)
Elmer Bales [phone] Pt from [facility] left a vm regarding pt fell she was trying to sit in chair and missed the chair on yesterday. Pt did not get hurt she called EMS to help her up. Pt also was not feeling well today and thinks that her sugar was low today it was 116 and when Irving Burton took it it was 118. Pt states she felt weak when she fell and was sweaty. Pt seems to be having more tremors over the past little bit per [place]. Thank you!

## 2015-10-15 NOTE — Telephone Encounter (Signed)
Please advise. Thanks.  

## 2015-10-16 ENCOUNTER — Telehealth: Payer: Self-pay | Admitting: *Deleted

## 2015-10-16 ENCOUNTER — Encounter: Payer: Self-pay | Admitting: Internal Medicine

## 2015-10-16 ENCOUNTER — Ambulatory Visit (INDEPENDENT_AMBULATORY_CARE_PROVIDER_SITE_OTHER): Payer: Medicare Other | Admitting: Internal Medicine

## 2015-10-16 ENCOUNTER — Other Ambulatory Visit: Payer: Self-pay | Admitting: Internal Medicine

## 2015-10-16 ENCOUNTER — Telehealth: Payer: Self-pay | Admitting: Internal Medicine

## 2015-10-16 VITALS — BP 126/84 | HR 74 | Temp 97.6°F | Ht 59.0 in | Wt 189.5 lb

## 2015-10-16 DIAGNOSIS — N183 Chronic kidney disease, stage 3 (moderate): Secondary | ICD-10-CM | POA: Diagnosis not present

## 2015-10-16 DIAGNOSIS — Z794 Long term (current) use of insulin: Secondary | ICD-10-CM

## 2015-10-16 DIAGNOSIS — E1122 Type 2 diabetes mellitus with diabetic chronic kidney disease: Secondary | ICD-10-CM

## 2015-10-16 DIAGNOSIS — E538 Deficiency of other specified B group vitamins: Secondary | ICD-10-CM | POA: Diagnosis not present

## 2015-10-16 DIAGNOSIS — I10 Essential (primary) hypertension: Secondary | ICD-10-CM | POA: Diagnosis not present

## 2015-10-16 DIAGNOSIS — M1710 Unilateral primary osteoarthritis, unspecified knee: Secondary | ICD-10-CM

## 2015-10-16 DIAGNOSIS — IMO0002 Reserved for concepts with insufficient information to code with codable children: Secondary | ICD-10-CM

## 2015-10-16 DIAGNOSIS — R531 Weakness: Secondary | ICD-10-CM | POA: Diagnosis not present

## 2015-10-16 LAB — COMPREHENSIVE METABOLIC PANEL
ALT: 11 U/L (ref 0–35)
AST: 16 U/L (ref 0–37)
Albumin: 3.7 g/dL (ref 3.5–5.2)
Alkaline Phosphatase: 59 U/L (ref 39–117)
BILIRUBIN TOTAL: 0.4 mg/dL (ref 0.2–1.2)
BUN: 21 mg/dL (ref 6–23)
CALCIUM: 9.1 mg/dL (ref 8.4–10.5)
CO2: 36 meq/L — AB (ref 19–32)
Chloride: 104 mEq/L (ref 96–112)
Creatinine, Ser: 1.09 mg/dL (ref 0.40–1.20)
GFR: 50.8 mL/min — AB (ref >60.00)
Glucose, Bld: 46 mg/dL — CL (ref 70–99)
POTASSIUM: 3.5 meq/L (ref 3.5–5.1)
Sodium: 146 mEq/L — ABNORMAL HIGH (ref 135–145)
Total Protein: 6.6 g/dL (ref 6.0–8.3)

## 2015-10-16 LAB — CBC WITH DIFFERENTIAL/PLATELET
BASOS ABS: 0 10*3/uL (ref 0.0–0.1)
BASOS PCT: 0.3 % (ref 0.0–3.0)
EOS PCT: 0.6 % (ref 0.0–5.0)
Eosinophils Absolute: 0.1 10*3/uL (ref 0.0–0.7)
HEMATOCRIT: 47.1 % — AB (ref 36.0–46.0)
Hemoglobin: 15.4 g/dL — ABNORMAL HIGH (ref 12.0–15.0)
LYMPHS PCT: 16.3 % (ref 12.0–46.0)
Lymphs Abs: 2 10*3/uL (ref 0.7–4.0)
MCHC: 32.7 g/dL (ref 30.0–36.0)
MCV: 93.6 fl (ref 78.0–100.0)
MONOS PCT: 5 % (ref 3.0–12.0)
Monocytes Absolute: 0.6 10*3/uL (ref 0.1–1.0)
NEUTROS ABS: 9.3 10*3/uL — AB (ref 1.4–7.7)
Neutrophils Relative %: 77.8 % — ABNORMAL HIGH (ref 43.0–77.0)
PLATELETS: 160 10*3/uL (ref 150.0–400.0)
RBC: 5.03 Mil/uL (ref 3.87–5.11)
RDW: 14.1 % (ref 11.5–15.5)
WBC: 12 10*3/uL — ABNORMAL HIGH (ref 4.0–10.5)

## 2015-10-16 MED ORDER — CYANOCOBALAMIN 1000 MCG/ML IJ SOLN
1000.0000 ug | Freq: Once | INTRAMUSCULAR | Status: AC
  Administered 2015-10-16: 1000 ug via INTRAMUSCULAR

## 2015-10-16 MED ORDER — HYDROCODONE-ACETAMINOPHEN 5-325 MG PO TABS: 1.0000 | ORAL_TABLET | Freq: Three times a day (TID) | ORAL | Status: DC | PRN

## 2015-10-16 NOTE — Telephone Encounter (Signed)
Patient was seen in the office today, please advise did she ask about this? thanks

## 2015-10-16 NOTE — Telephone Encounter (Signed)
Can you please call her and ask her to check BG at home?

## 2015-10-16 NOTE — Progress Notes (Signed)
Pre visit review using our clinic review tool, if applicable. No additional management support is needed unless otherwise documented below in the visit note. 

## 2015-10-16 NOTE — Telephone Encounter (Signed)
Spoke with Cordelia Pen gave verbal for bandaid or tegaderm.

## 2015-10-16 NOTE — Assessment & Plan Note (Signed)
BG mostly 150-200s at home. Will continue Lantus 45units daily.

## 2015-10-16 NOTE — Telephone Encounter (Signed)
Fine to apply tegaderm or bandaid

## 2015-10-16 NOTE — Patient Instructions (Signed)
Labs today     Follow up in 2 weeks

## 2015-10-16 NOTE — Telephone Encounter (Signed)
OK. She has an appointment today

## 2015-10-16 NOTE — Telephone Encounter (Signed)
Patient has appt today.  Please hold for her. thanks

## 2015-10-16 NOTE — Telephone Encounter (Signed)
Pt daughter called requesting a refill for her mother on HYDROcodone-acetaminophen (NORCO/VICODIN) 5-325 MG tablet please advise when ready @ [phone]

## 2015-10-16 NOTE — Telephone Encounter (Signed)
Renee Rival [facility] [phone] called and left a vm on triage phone stating that Pt had a doctors appt yesterday and hit her arm against the car and has a skin tear. Cordelia Pen wants to know if it can be treated with tegaderm or something. She does have a bandage on it. Thank you!

## 2015-10-16 NOTE — Assessment & Plan Note (Signed)
BP Readings from Last 3 Encounters:  10/16/15 126/84  09/17/15 109/72  09/14/15 159/77   BP well controlled. Renal function stable on labs.

## 2015-10-16 NOTE — Assessment & Plan Note (Signed)
Generalized weakness and malaise today. Exam normal. No focal symptoms or exam findings. Vitals normal.Pt was unable to give urine sample. Blood showed mild elevation of WBC and low glucose, however this was most likely related to time in tube, as pt BG prior to office visit over 200. Encouraged adequate fluids, rest. She will seek care this weekend if worsening symptoms, fevers. We discussed that this may be early viral infection.

## 2015-10-16 NOTE — Progress Notes (Signed)
Subjective:    Patient ID: [name], female    DOB: 1928/09/02, 82 y.o.   MRN: [id number]  HPI  82YO female presents for follow up.  DM - Recently decreased Lantus to 45units daily. BG mostly near 200.  Feeling weak generally today and yesterday. No urinary symptoms. No cough. Feels short of breath at times, but this is at baseline. Occasional chills. No NVD. No urinary symptoms. No falls.   Wt Readings from Last 3 Encounters:  10/16/15 189 lb 8 oz (85.957 kg)  09/17/15 194 lb 4 oz (88.111 kg)  09/14/15 194 lb (87.998 kg)   BP Readings from Last 3 Encounters:  10/16/15 126/84  09/17/15 109/72  09/14/15 159/77    Past Medical History  Diagnosis Date  . Anxiety   . Chronic diastolic CHF (congestive heart failure) (HCC)   . Depression   . Diabetes mellitus     Followed by Dr. Tedd Sias at [facility]  . GERD (gastroesophageal reflux disease)   . Hypertension   . Hypothyroidism   . Osteoporosis   . Allergy   . Hyperlipidemia   . RLS (restless legs syndrome)   . Pancreatic disease     pancreatic failure  . Urinary incontinence   . Pancreatitis     Pancreatic resection/ open wound- [facility] 11/04  . Pneumonia     Pneumonia/ Emphysema 05/05  . 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 TIA (transient ischemic attack)     recurrent 12/15  . History of pleural empyema   . Sepsis (HCC)   . Frequent falls   . Arthritis     left knee, [facility]  . CAD (coronary artery disease), native coronary artery      1//15  T mid-LAD  stent OM2  . Parkinson's disease (HCC)    Family History  Problem Relation Age of Onset  . Heart disease Mother   . Heart attack Mother   . Hypertension Mother   . Heart attack Father    Past Surgical History  Procedure Laterality Date  . Breast lumpectomy      Lumpectomy right breast  . 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 Abdominal hysterectomy     Hysteroscopy/ D & C ([facility]) 12/05  . Removal of pancreas  2004  . Kyphoplasty  2005  . Broken shoulder and orbital bone  2005  . Cholecystectomy  1973  . Thoracentesis    . Vaginal delivery      9  . Cardiac catheterization  08/29/2013    x1 stent @ [facility]  . Cataract surgery     Social History   Social History  . Marital Status: Widowed    Spouse Name: N/A  . Number of Children: 7  . Years of Education: 6   Occupational History  . retired- Veterinary surgeon    Social History Main Topics  . Smoking status: Never Smoker   . Smokeless tobacco: Never Used  . Alcohol Use: No  . Drug Use: No  . Sexual Activity: Not Asked   Other Topics Concern  . None   Social History Narrative   Lives in home alone, has nursing aid, and alert device in [place] [facility] [place]. Has 7 children.            Has [name], daughter, as health care POA   Not sure about DNR   Permission to speak to daughter-in-law,  Georgian Co, regarding health care matters.    Review of Systems  Constitutional: Positive for fatigue. Negative for fever, chills, appetite change and unexpected weight change.  Eyes: Negative for visual disturbance.  Respiratory: Positive for shortness of breath.   Cardiovascular: Negative for chest pain, palpitations and leg swelling.  Gastrointestinal: Negative for nausea, vomiting, abdominal pain, diarrhea and constipation.  Genitourinary: Negative for dysuria, urgency and frequency.  Musculoskeletal: Positive for arthralgias.  Skin: Negative for color change and rash.  Neurological: Positive for weakness.  Hematological: Negative for adenopathy. Does not bruise/bleed easily.  Psychiatric/Behavioral: Negative for dysphoric mood. The patient is not nervous/anxious.        Objective:    BP 126/84 mmHg  Pulse 74  Temp(Src) 97.6 F (36.4 C) (Oral)  Ht  (1.499 m)  Wt 189 lb 8 oz (85.957 kg)  BMI 38.25 kg/m2  SpO2 96%  LMP  (LMP Unknown) Physical Exam  Constitutional: She is  oriented to person, place, and time. She appears well-developed and well-nourished. She has a sickly appearance. No distress.  HENT:  Head: Normocephalic and atraumatic.  Right Ear: External ear normal.  Left Ear: External ear normal.  Nose: Nose normal.  Mouth/Throat: Oropharynx is clear and moist. No oropharyngeal exudate.  Eyes: Conjunctivae are normal. Pupils are equal, round, and reactive to light. Right eye exhibits no discharge. Left eye exhibits no discharge. No scleral icterus.  Neck: Normal range of motion. Neck supple. No tracheal deviation present. No thyromegaly present.  Cardiovascular: Normal rate, regular rhythm, normal heart sounds and intact distal pulses.  Exam reveals no gallop and no friction rub.   No murmur heard. Pulmonary/Chest: Effort normal and breath sounds normal. No respiratory distress. She has no wheezes. She has no rales. She exhibits no tenderness.  Musculoskeletal: Normal range of motion. She exhibits no edema or tenderness.  Lymphadenopathy:    She has no cervical adenopathy.  Neurological: She is alert and oriented to person, place, and time. No cranial nerve deficit. She exhibits normal muscle tone. Coordination normal.  Skin: Skin is warm and dry. No rash noted. She is not diaphoretic. No erythema. No pallor.  Psychiatric: She has a normal mood and affect. Her behavior is normal. Judgment and thought content normal.          Assessment & Plan:   Problem List Items Addressed This Visit      High   Diabetes type 2, controlled (HCC) - Primary (Chronic)    BG mostly 150-200s at home. Will continue Lantus 45units daily.        Unprioritized   Essential hypertension (Chronic)    BP Readings from Last 3 Encounters:  10/16/15 126/84  09/17/15 109/72  09/14/15 159/77   BP well controlled. Renal function stable on labs.      Generalized weakness    Generalized weakness and malaise today. Exam normal. No focal symptoms or exam findings. Vitals  normal.Pt was unable to give urine sample. Blood showed mild elevation of WBC and low glucose, however this was most likely related to time in tube, as pt BG prior to office visit over 200. Encouraged adequate fluids, rest. She will seek care this weekend if worsening symptoms, fevers. We discussed that this may be early viral infection.      Relevant Orders   CBC with Differential/Platelet (Completed)   Comprehensive metabolic panel (Completed)   POCT urinalysis dipstick    Other Visit Diagnoses    B12 deficiency  Relevant Medications    cyanocobalamin ((VITAMIN B-12)) injection 1,000 mcg (Completed)        Return in about 2 weeks (around 10/30/2015) for Recheck.  Ronna Polio, MD Internal Medicine [facility]

## 2015-10-16 NOTE — Telephone Encounter (Signed)
Received a Critical lab:  Extremely low glucose at 46. Per [facility] lab

## 2015-10-17 ENCOUNTER — Other Ambulatory Visit: Payer: Self-pay | Admitting: Internal Medicine

## 2015-10-17 NOTE — Telephone Encounter (Signed)
Okay to refill? Medication listed as "historical" on med list.

## 2015-10-19 ENCOUNTER — Telehealth: Payer: Self-pay | Admitting: Internal Medicine

## 2015-10-19 MED ORDER — INSULIN GLARGINE 100 UNIT/ML ~~LOC~~ SOLN: 45.0000 [IU] | Freq: Every day | SUBCUTANEOUS | Status: DC

## 2015-10-19 MED ORDER — INSULIN LISPRO 100 UNIT/ML ~~LOC~~ SOLN: 8.0000 [IU] | Freq: Three times a day (TID) | SUBCUTANEOUS | Status: DC

## 2015-10-19 NOTE — Telephone Encounter (Signed)
Pt's daughter said she can not grip it to turn the dial, her daughter states that she can draw up the the needles and leave them in the fridge for to use when needed.

## 2015-10-19 NOTE — Telephone Encounter (Signed)
OK. Fine to call in new Rx for vials to pharmacy

## 2015-10-19 NOTE — Telephone Encounter (Signed)
Daughter called and stated that Ms [name] BG readings were Sat at breakfast 197, lunch 181, dinner 181, Sunday at breakfast 237, lunch, 208. Pt is having problems dialing up her nighttime Lantus, pt would like to go back to vials. Please advise, thanks

## 2015-10-19 NOTE — Telephone Encounter (Signed)
Pt daughter called about pt needing a Rx for needles to go on the end of the insulin pen. Pt uses insulin glargine (LANTUS) 100 UNIT/ML injection and insulin lispro (HUMALOG) 100 UNIT/ML injection. Pharmacy is [facility] [phone] [place], [place] - [address]. Call pt daughter Steward Drone lassiter @ [phone]. Thank you!

## 2015-10-19 NOTE — Telephone Encounter (Signed)
Why is she having trouble dialing up her Lantus? This would be much easier than drawing from vials? Those blood sugars are fine for her. I prefer for her to run higher given multiple issues with low BG in past.

## 2015-10-20 ENCOUNTER — Other Ambulatory Visit: Payer: Self-pay | Admitting: *Deleted

## 2015-10-20 ENCOUNTER — Ambulatory Visit: Payer: Self-pay | Admitting: Internal Medicine

## 2015-10-20 ENCOUNTER — Telehealth: Payer: Self-pay | Admitting: *Deleted

## 2015-10-20 ENCOUNTER — Encounter: Payer: Self-pay | Admitting: Internal Medicine

## 2015-10-20 DIAGNOSIS — E1122 Type 2 diabetes mellitus with diabetic chronic kidney disease: Secondary | ICD-10-CM

## 2015-10-20 DIAGNOSIS — N181 Chronic kidney disease, stage 1: Principal | ICD-10-CM

## 2015-10-20 NOTE — Patient Outreach (Addendum)
[facility]) Care Management  10/20/2015  [name] February 26, 1929 [id number]  Subjective: Telephone call to patient's home number [phone]) per Epic call initiation tab, no answer, left HIPAA compliant voice mail message requesting call back.  Telephone to home number [phone]) per demographic in Epic, spoke with patient, and HIPAA verified.   Patient gave verbal authorization for RNCM to speak with her children Allen Derry, Josalin Carneiro, [name], and Elsworth Soho) regarding her healthcare needs as needed.   Discussed [facility] Care Management services and patient in agreement to receive services.   Patient states she is doing well and has  aide through [facility] for 4 hours a day.   Patient in agreement to receive [facility] Care Management services for diabetes education, community resources, disease monitoring, and medication review.  Patient states she also has Parkinson's Disease and is in the process of asking her MD to increase her medication.   Patient states she does not have any care coordination needs at this time.    Objective:  Per Epic case review: Patient has a history of  diabetes, chronic congestive heart failure, hypertension, and C. difficile enterocolitis.    Patient hospitalized 09/11/15 - 09/14/15 for hypoglycemia.  Patient has had home care services through [facility].    Assessment:  Received [employer] Tier 4 list referral on 10/07/15.  Patient sent information letter, magnet and brochure from [facility] Care Management.   2 Admissions, 3 Ambulance encounters, 2 ER visits, and  73 Home Health claims.    Diagnosis: Congestive Heart Failure.    Patient is in need of [facility] Care Management services.   Plan: RNCM will refer patient to [facility] Health Coach for diabetes education, community resources, and disease monitoring. RNCM will refer patient to [facility] for medication review.    [name] H. Gardiner Barefoot, BSN, CCM [facility] Care Management [facility] Telephonic  CM Phone: [phone] Fax: [phone]

## 2015-10-23 ENCOUNTER — Other Ambulatory Visit: Payer: Self-pay | Admitting: *Deleted

## 2015-10-23 NOTE — Patient Outreach (Signed)
[facility]) Care Management  10/23/2015  Cloria SpringClara O Bohnet 08/29/1928 [id number]   RN Health Coach attempted #1  introduction Telephone outreach call to patient.  Patient was unavailable. HIPPA compliance voicemail message was left with return callback number.    Gean MaidensFrances Devin Foskey, BSN, RN [facility] Care Management RN Health Coach Phone: [phone] [facility] complies with applicable Federal civil rights laws and does not discriminate on the basis of race, color, national origin, age, disability, or sex. Espaol (Spanish)  [facility] cumple con las leyes federales de derechos civiles aplicables y no discrimina por motivos de raza, color, nacionalidad, edad, discapacidad o sexo.    Ti[place] Vi?t ([place]Vietnamese[place]  [facility] tun th? lu?t dn quy?n hi?n hnh c?a Lin bang v khng phn bi?t ?i x? d?a trn ch?ng t?c, mu da, ngu?n g?c qu?c gia, ? tu?i, khuy?t t?t, ho?c gi?i tnh.    (Arabic)    [facility]                      .

## 2015-10-26 LAB — CUP PACEART REMOTE DEVICE CHECK: MDC IDC SESS DTM: 20170120173619

## 2015-10-26 NOTE — Progress Notes (Signed)
Carelink summary report received. Battery status OK. Normal device function. No new symptom episodes, tachy episodes, brady, or pause episodes. No new AF episodes. Monthly summary reports and ROV/PRN 

## 2015-10-27 ENCOUNTER — Other Ambulatory Visit: Payer: Self-pay | Admitting: *Deleted

## 2015-10-27 ENCOUNTER — Encounter: Payer: Medicare Other | Attending: Internal Medicine | Admitting: Internal Medicine

## 2015-10-27 DIAGNOSIS — E11622 Type 2 diabetes mellitus with other skin ulcer: Secondary | ICD-10-CM | POA: Insufficient documentation

## 2015-10-27 DIAGNOSIS — X58XXXS Exposure to other specified factors, sequela: Secondary | ICD-10-CM | POA: Insufficient documentation

## 2015-10-27 DIAGNOSIS — E6609 Other obesity due to excess calories: Secondary | ICD-10-CM | POA: Diagnosis not present

## 2015-10-27 DIAGNOSIS — I509 Heart failure, unspecified: Secondary | ICD-10-CM | POA: Diagnosis not present

## 2015-10-27 DIAGNOSIS — S31102S Unspecified open wound of abdominal wall, epigastric region without penetration into peritoneal cavity, sequela: Secondary | ICD-10-CM | POA: Insufficient documentation

## 2015-10-27 NOTE — Patient Outreach (Signed)
[facility]) Care Management  10/27/2015  [name] SpringClara O [name] 02/26/1929 [id number]   RN Health Coach attempted  2nd Follow up outreach call to patient.  Patient was unavailable. HIPPA compliance voicemail message was left with return callback number.    Gean MaidensFrances Lafe Clerk, BSN, RN [facility] Care Management RN Health Coach Phone: [phone] [facility] complies with applicable Federal civil rights laws and does not discriminate on the basis of race, color, national origin, age, disability, or sex. Espaol (Spanish)  [facility] cumple con las leyes federales de derechos civiles aplicables y no discrimina por motivos de raza, color, nacionalidad, edad, discapacidad o sexo.    [place] ([place]Vietnamese[place]  [facility] tun th? lu?t dn quy?n hi?n hnh c?a Lin bang v khng phn bi?t ?i x? d?a trn ch?ng t?c, mu da, ngu?n g?c qu?c gia, ? tu?i, khuy?t t?t, ho?c gi?i tnh.    (Arabic)    [facility]                      .

## 2015-10-29 ENCOUNTER — Telehealth: Payer: Self-pay

## 2015-10-29 NOTE — Telephone Encounter (Signed)
Yes, fine to check UA and culture

## 2015-10-29 NOTE — Progress Notes (Signed)
[name], Aayla O. ([id number]) Visit Report for 10/27/2015 Chief Complaint Document Details Patient Name: [name], Rigby O. Date of Service: 10/27/2015 2:00 PM Medical Record Patient Account Number: [id number] Number: Treating RN: Curtis SitesDorthy, Joanna 08/06/1929 (82 y.o. 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 ([id number]) -------------------------------------------------------------------------------- HPI Details Patient Name: [name], Isa O. Date of Service: 10/27/2015 2:00 PM Medical Record Patient Account Number: [id number] Number: Treating RN: Curtis SitesDorthy, Joanna 03/21/1929 (82 y.o. Other Clinician: Date of Birth/Sex: Female) Treating Iviana Blasingame Primary Care Physician/Extender: Louie CasaG Walker, [name] Physician: Referring Physician: Jessee AversWalker, [name] Weeks in Treatment: 25 History of Present Illness Location: ulceration on the epigastric region Quality: Patient reports No Pain. Severity: Patient states wound (s) are getting better. Duration: Patient has had the wound for < 4 weeks prior to presenting for treatment Context: The wound occurred when the patient started having some profuse bleeding from the wound and was admitted to the hospital and they noticed that the bleeding had stopped. Associated Signs and Symptoms: Wound has minimal bloody drainage HPI Description: Pleasant 82 year old with h/o ex lap for pancreatitis, DM, CHF. returns to clinic for  evaluation of her open abdominal wound. She has been applying silver alginate. She is without complaints today. No significant pain. No drainage. No fever or chills. Tolerating a regular diet and having regular bowel movements. 06/02/2015 -- patient has had multiple areas which are now draining fluid and form an eschar. 09/16/2015 -- was admitted to specialty recently with a lower GI infection and has been treated appropriately. Still feels very weak. 10/27/15; this is a patient I have not seen previously. She tells me that she had almost a complete pancreatectomy secondary to pancreatitis. She arrived here with multiple draining areas. She is using some sort of topical ointment, Sorbact, with a cover. All of these wounds have closed over. I suspect she has underlying mesh although I'm not completely certain about this. No debridement was required Electronic Signature(s) Signed: 10/28/2015 4:53:46 PM By: Baltazar Najjarobson, Anguel Delapena MD Entered By: Baltazar Najjarobson, Keevon Henney on 10/27/2015 14:45:47 Kemnitz, Lucile ShuttersLARA O. ([id number]) -------------------------------------------------------------------------------- Physical Exam Details Patient Name: [name], Taijah O. Date of Service: 10/27/2015 2:00 PM Medical Record Patient Account Number: [id number] Number: Treating RN: Curtis SitesDorthy, Joanna 10/30/1928 (82 y.o. Other Clinician: Date of Birth/Sex: Female) Treating Kristena Wilhelmi Primary Care Physician/Extender: Louie CasaG Walker, [name] Physician: Referring Physician: Jessee AversWalker, [name] Weeks in Treatment: 25 Constitutional Sitting or standing Blood Pressure is within target range for patient.. Pulse regular and within target range for patient.Marland Kitchen. Respirations regular, non-labored and within target range.. Temperature is normal and within the target range for the patient.. Patient is frail however cognitively alert and able to answer all my questions. She does not look to be in any distress.. Eyes Conjunctivae clear. No  discharge.Marland Kitchen. Respiratory Respiratory effort is easy and symmetric bilaterally. Rate is normal at rest and on room air.. Bilateral breath sounds are clear and equal in all lobes with no wheezes, rales or rhonchi.. Cardiovascular Heart rhythm and rate regular, without murmur or gallop.. Gastrointestinal (GI) Abdomen is soft and non-distended without masses or tenderness. Bowel  sounds active in all quadrants.. No liver or spleen enlargement or tenderness.. Patient has hernias around the surgical site. Notes Wound exam; the area is in her epigastric area. Surrounding hernias. There is no open area here. As long as she keeps the area moist and protected she has a chance of maintaining the closure of the area although I suspect there may be recurrent superficial ulcerations for which she will need our assistance. Electronic Signature(s) Signed: 10/28/2015 4:53:46 PM By: Baltazar Najjar MD Entered By: Baltazar Najjar on 10/27/2015 14:47:53 Cousar, [name] ([id number]) -------------------------------------------------------------------------------- Physician Orders Details Patient Name: [name] Spring Date of Service: 10/27/2015 2:00 PM Medical Record Patient Account Number: [id number] Number: Treating RN: Curtis Sites 08-12-1929 (82 y.o. Other Clinician: Date of Birth/Sex: Female) Treating Kebron Pulse Primary Care Physician/Extender: Louie Casa Physician: Referring Physician: Jessee Avers in Treatment: 25 Verbal / Phone Orders: Yes Clinician: Curtis Sites Read Back and Verified: Yes Diagnosis Coding Primary Wound Dressing o Hydrogel - to any dry areas within scarred area Secondary Dressing o Boardered Foam Dressing - covering scarred area Dressing Change Frequency o Change Dressing Monday, Wednesday, Friday Home Health o Continue Home Health Visits o Home Health Nurse may visit PRN to address patientos wound care needs. o FACE TO FACE  ENCOUNTER: MEDICARE and MEDICAID PATIENTS: I certify that this patient is under my care and that I had a face-to-face encounter that meets the physician face-to-face encounter requirements with this patient on this date. The encounter with the patient was in whole or in part for the following MEDICAL CONDITION: (primary reason for Home Healthcare) MEDICAL NECESSITY: I certify, that based on my findings, NURSING services are a medically necessary home health service. HOME BOUND STATUS: I certify that my clinical findings support that this patient is homebound (i.e., Due to illness or injury, pt requires aid of supportive devices such as crutches, cane, wheelchairs, walkers, the use of special transportation or the assistance of another person to leave their place of residence. There is a normal inability to leave the home and doing so requires considerable and taxing effort. Other absences are for medical reasons / religious services and are infrequent or of short duration when for other reasons). o If current dressing causes regression in wound condition, may D/C ordered dressing product/s and apply Normal Saline Moist Dressing daily until next Wound Healing Center / Other MD appointment. Notify Wound Healing Center of regression in wound condition at [phone]. o Please direct any NON-WOUND related issues/requests for orders to patient's Primary Care Physician Discharge From [facility] o Discharge from Wound Care Center Electronic Signature(s) Signed: 10/27/2015 2:19:11 PM By: Curtis Sites Signed: 10/28/2015 4:53:46 PM By: Baltazar Najjar MD ANGELIQUE, CHEVALIER ([id number]) Entered By: Curtis Sites on 10/27/2015 14:19:11 ANGELINE, TRICK ([id number]) -------------------------------------------------------------------------------- Problem List Details Patient Name: MAKAILEY, HODGKIN. Date of Service: 10/27/2015 2:00 PM Medical Record Patient Account Number:  [id number] Number: Treating RN: Curtis Sites [birth year] (82 y.o. Other Clinician: Date of Birth/Sex: Female) Treating Earnestine Tuohey Primary Care Physician/Extender: Louie Casa Physician: Referring Physician: Jessee Avers in Treatment: 25 Active Problems ICD-10 Encounter Code Description Active Date Diagnosis E11.622 Type 2 diabetes mellitus with other skin ulcer 05/05/2015 Yes S31.102S Unspecified open wound of abdominal wall, epigastric 05/05/2015 Yes region without penetration into peritoneal cavity, sequela E66.09 Other obesity due to excess calories 05/05/2015 Yes Inactive Problems Resolved Problems Electronic Signature(s) Signed: 10/28/2015 4:53:46 PM By: Baltazar Najjar MD Entered By: Baltazar Najjar on 10/27/2015 14:43:29  AYRIEL, TEXIDOR ([id number]) -------------------------------------------------------------------------------- Progress Note Details Patient Name: SHARLEY, KEELER. Date of Service: 10/27/2015 2:00 PM Medical Record Patient Account Number: [id number] Number: Treating RN: Curtis Sites 1929-05-14 (82 y.o. 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 Severity: Patient states wound (s) are getting better. Duration: Patient has had the wound for < 4 weeks prior to presenting for treatment Context: The wound occurred when the patient started having some  profuse bleeding from the wound and was admitted to the hospital and they noticed that the bleeding had stopped. Associated Signs and Symptoms: Wound has minimal bloody drainage Pleasant 82 year old with h/o ex lap for pancreatitis, DM, CHF. returns to clinic for evaluation of her open abdominal wound. She has been applying silver alginate. She is without complaints today. No significant pain. No drainage. No fever or chills. Tolerating a regular diet and having regular bowel movements. 06/02/2015 -- patient has had multiple areas which are now draining fluid and form an eschar. 09/16/2015 -- was admitted to specialty recently with a lower GI infection and has been treated appropriately. Still feels very weak. 10/27/15; this is a patient I have not seen previously. She tells me that she had almost a complete pancreatectomy secondary to pancreatitis. She arrived here with multiple draining areas. She is using some sort of topical ointment, Sorbact, with a cover. All of these wounds have closed over. I suspect she has underlying mesh although I'm not completely certain about this. No debridement was required LITZI, BINNING. ([id number]) Objective Constitutional Sitting or standing Blood Pressure is within target range for patient.. Pulse regular and within target range for patient.Marland Kitchen Respirations regular, non-labored and within target range.. Temperature is normal and within the target range for the patient.. Patient is frail however cognitively alert and able to answer all my questions. She does not look to be in any distress.. Vitals Time Taken: 1:57 PM, Height: 59 in, Weight: 180 lbs, BMI: 36.4, Pulse: 63 bpm, Respiratory Rate: 18 breaths/min, Blood Pressure: 119/62 mmHg. Eyes Conjunctivae clear. No discharge.Marland Kitchen Respiratory Respiratory effort is easy and symmetric bilaterally. Rate is normal at rest and on room air.. Bilateral breath sounds are clear and equal in all lobes with no wheezes,  rales or rhonchi.. Cardiovascular Heart rhythm and rate regular, without murmur or gallop.. Gastrointestinal (GI) Abdomen is soft and non-distended without masses or tenderness. Bowel sounds active in all quadrants.. No liver or spleen enlargement or tenderness.. Patient has hernias around the surgical site. General Notes: Wound exam; the area is in her epigastric area. Surrounding hernias. There is no open area here. As long as she keeps the area moist and protected she has a chance of maintaining the closure of the area although I suspect there may be recurrent superficial ulcerations for which she will need our assistance. Integumentary (Hair, Skin) Wound #8 status is Healed - Epithelialized. Original cause of wound was Not Known. The wound is located on the Medial Abdomen - midline. The wound measures 0cm length x 0cm width x 0cm depth; 0cm^2  area and 0cm^3 volume. Assessment Active Problems ICD-10 E11.622 - Type 2 diabetes mellitus with other skin ulcer S31.102S - Unspecified open wound of abdominal wall, epigastric region without penetration into peritoneal cavity, sequela E66.09 - Other obesity due to excess calories Dubberly, Nona O. ([id number]) Plan Primary Wound Dressing: Hydrogel - to any dry areas within scarred area Secondary Dressing: Boardered Foam Dressing - covering scarred area Dressing Change Frequency: Change Dressing Monday, Wednesday, Friday Home Health: Continue Home Health Visits Home Health Nurse may visit PRN to address patient s wound care needs. FACE TO FACE ENCOUNTER: MEDICARE and MEDICAID PATIENTS: I certify that this patient is under my care and that I had a face-to-face encounter that meets the physician face-to-face encounter requirements with this patient on this date. The encounter with the patient was in whole or in part for the following MEDICAL CONDITION: (primary reason for Home Healthcare) MEDICAL NECESSITY: I certify, that based on my  findings, NURSING services are a medically necessary home health service. HOME BOUND STATUS: I certify that my clinical findings support that this patient is homebound (i.e., Due to illness or injury, pt requires aid of supportive devices such as crutches, cane, wheelchairs, walkers, the use of special transportation or the assistance of another person to leave their place of residence. There is a normal inability to leave the home and doing so requires considerable and taxing effort. Other absences are for medical reasons / religious services and are infrequent or of short duration when for other reasons). If current dressing causes regression in wound condition, may D/C ordered dressing product/s and apply Normal Saline Moist Dressing daily until next Wound Healing Center / Other MD appointment. Notify Wound Healing Center of regression in wound condition at [phone]. Please direct any NON-WOUND related issues/requests for orders to patient's Primary Care Physician Discharge From [facility] Services: Discharge from Wound Care Center o #1 surgical wound in the epigastric area. This is totally closed over there are no open areas here. There is likely underlying mesh here. Periwound hernias reduce easily there is no tenderness. #2 I don't think this requires Sorbact at this point simply something to keep it moist and protected. TRINITA, DEVLIN ([id number]) #3 I think there is a likelihood of recurrent ulcerations here however for now I don't think she needs to be followed in the clinic and I have discharged her. Electronic Signature(s) Signed: 10/28/2015 4:53:46 PM By: Baltazar Najjar MD Entered By: Baltazar Najjar on 10/27/2015 14:51:50 Dorsch, [name] ([id number]) -------------------------------------------------------------------------------- SuperBill Details Patient Name: [name] Spring Date of Service: 10/27/2015 Medical Record Patient Account Number: [id number] Number: Treating  RN: Curtis Sites 09/11/1928 (82 y.o. Other Clinician: Date of Birth/Sex: Female) Treating Soniyah Mcglory Primary Care Physician/Extender: Louie Casa Physician: Tania Ade in Treatment: 25 Referring Physician: Ronna Polio Diagnosis Coding ICD-10 Codes Code Description [phone] Type 2 diabetes mellitus with other skin ulcer Unspecified open wound of abdominal wall, epigastric region without penetration into S31.102S peritoneal cavity, sequela E66.09 Other obesity due to excess calories Facility Procedures CPT4 Code: [id number] Description: [id number] - WOUND CARE VISIT-LEV 2 EST PT Modifier: Quantity: 1 Physician Procedures CPT4: Description Modifier Quantity Code [id number] - WC PHYS LEVEL 3 - EST PT 1 ICD-10 Description Diagnosis S31.102S Unspecified open wound of abdominal wall, epigastric region without penetration into peritoneal cavity, sequela Electronic Signature(s) Signed: 10/28/2015 4:53:46 PM By: Baltazar Najjar MD Entered By: Baltazar Najjar on 10/27/2015 14:52:49

## 2015-10-29 NOTE — Telephone Encounter (Signed)
Verbal Order given  

## 2015-10-29 NOTE — Progress Notes (Signed)
[name] ([id number]) Visit Report for 10/27/2015 Arrival Information Details Patient Name: [name], [name]. Date of Service: 10/27/2015 2:00 PM Medical Record Patient Account Number: [id number] Number: Treating RN: [name] 18-May-1929 (82 y.o. Other Clinician: Date of Birth/Sex: Female) Treating [name] Primary Care Physician/Extender: [name] Physician: Referring Physician: Jessee [name] in Treatment: 25 Visit Information History Since Last Visit Added or deleted any medications: No Patient Arrived: Wheel Chair Any new allergies or adverse reactions: No Arrival Time: 13:56 Had a fall or experienced change in No Accompanied By: dtr activities of daily living that may affect Transfer Assistance: None risk of falls: Patient Identification Verified: Yes Signs or symptoms of abuse/neglect since last No Secondary Verification Process Yes visito Completed: Hospitalized since last visit: No Patient Requires Transmission- No Pain Present Now: No Based Precautions: Patient Has Alerts: Yes Patient Alerts: Patient on Blood Thinner Electronic Signature(s) Signed: 10/27/2015 4:47:49 PM By: [name] Entered By: [name] on 10/27/2015 14:05:59 [name] ([id number]) -------------------------------------------------------------------------------- Clinic Level of Care Assessment Details Patient Name: [name] Date of Service: 10/27/2015 2:00 PM Medical Record Patient Account Number: [id number] Number: Treating RN: [name] Feb 28, 1929 (82 y.o. Other Clinician: Date of Birth/Sex: Female) Treating [name] Primary Care Physician/Extender: [name] Physician: Referring Physician: Jessee [name] in Treatment: 25 Clinic Level of Care Assessment Items TOOL 4 Quantity Score  - Use when only an EandM is performed on FOLLOW-UP visit 0 ASSESSMENTS - Nursing Assessment /  Reassessment X - Reassessment of Co-morbidities (includes updates in patient status) 1 10 X - Reassessment of Adherence to Treatment Plan 1 5 ASSESSMENTS - Wound and Skin Assessment / Reassessment X - Simple Wound Assessment / Reassessment - one wound 1 5  - Complex Wound Assessment / Reassessment - multiple wounds 0  - Dermatologic / Skin Assessment (not related to wound area) 0 ASSESSMENTS - Focused Assessment  - Circumferential Edema Measurements - multi extremities 0  - Nutritional Assessment / Counseling / Intervention 0  - Lower Extremity Assessment (monofilament, tuning fork, pulses) 0  - Peripheral Arterial Disease Assessment (using hand held doppler) 0 ASSESSMENTS - Ostomy and/or Continence Assessment and Care  - Incontinence Assessment and Management 0  - Ostomy Care Assessment and Management (repouching, etc.) 0 PROCESS - Coordination of Care X - Simple Patient / Family Education for ongoing care 1 15  - Complex (extensive) Patient / Family Education for ongoing care 0  - Staff obtains Consents, Records, Test Results / Process Orders 0 RUT, BETTERTON ([id number])  - Staff telephones HHA, Nursing Homes / Clarify orders / etc 0  - Routine Transfer to another Facility (non-emergent condition) 0  - Routine Hospital Admission (non-emergent condition) 0  - New Admissions / Manufacturing engineer / Ordering NPWT, Apligraf, etc. 0  - Emergency Hospital Admission (emergent condition) 0 X - Simple Discharge Coordination 1 10  - Complex (extensive) Discharge Coordination 0 PROCESS - Special Needs  - Pediatric / Minor Patient Management 0  - Isolation Patient Management 0  - Hearing / Language / Visual special needs 0  - Assessment of Community assistance (transportation, D/C planning, etc.) 0  - Additional assistance / Altered mentation 0  - Support Surface(s) Assessment (bed, cushion, seat, etc.) 0 INTERVENTIONS - Wound Cleansing /  Measurement X - Simple Wound Cleansing - one wound 1 5  - Complex Wound Cleansing - multiple wounds 0 X - Wound Imaging (photographs - any number of wounds) 1 5  -  Wound Tracing (instead of photographs) 0 X - Simple Wound Measurement - one wound 1 5 []  - Complex Wound Measurement - multiple wounds 0 INTERVENTIONS - Wound Dressings []  - Small Wound Dressing one or multiple wounds 0 []  - Medium Wound Dressing one or multiple wounds 0 []  - Large Wound Dressing one or multiple wounds 0 []  - Application of Medications - topical 0 []  - Application of Medications - injection 0 [name], [name] ([id number]) INTERVENTIONS - Miscellaneous []  - External ear exam 0 []  - Specimen Collection (cultures, biopsies, blood, body fluids, etc.) 0 []  - Specimen(s) / Culture(s) sent or taken to Lab for analysis 0 []  - Patient Transfer (multiple staff / Michiel SitesHoyer Lift / Similar devices) 0 []  - Simple Staple / Suture removal (25 or less) 0 []  - Complex Staple / Suture removal (26 or more) 0 []  - Hypo / Hyperglycemic Management (close monitor of Blood Glucose) 0 []  - Ankle / Brachial Index (ABI) - do not check if billed separately 0 X - Vital Signs 1 5 Has the patient been seen at the hospital within the last three years: Yes Total Score: 65 Level Of Care: New/Established - Level 2 Electronic Signature(s) Signed: 10/27/2015 2:19:45 PM By: [name] Entered By: [name] on 10/27/2015 14:19:45 [name], [name] ([id number]) -------------------------------------------------------------------------------- Encounter Discharge Information Details Patient Name: [name] SpringLLOYD, [name] Date of Service: 10/27/2015 2:00 PM Medical Record Patient Account Number: [id number] Number: Treating RN: [name] SitesDorthy, [name] 12/22/1928 (82 y.o. Other Clinician: Date of Birth/Sex: Female) Treating [name] Primary Care Physician/Extender: [name] CasaG Walker, [name] Physician: Referring Physician: Jessee AversWalker, [name] Weeks in  Treatment: 25 Encounter Discharge Information Items Discharge Pain Level: 0 Discharge Condition: Stable Ambulatory Status: Wheelchair Discharge Destination: Home Transportation: Private Auto Accompanied By: dtr Schedule Follow-up Appointment: No Medication Reconciliation completed and provided to Patient/Care No Mohmed Farver: Provided on Clinical Summary of Care: 10/27/2015 Form Type Recipient Paper Patient CL Electronic Signature(s) Signed: 10/27/2015 2:20:06 PM By: [name] Previous Signature: 10/27/2015 2:16:41 PM Version By: Gwenlyn PerkingMoore, Shelia Entered By: [name] on 10/27/2015 14:20:06 [name] SpringLLOYD, [name] ([id number]) -------------------------------------------------------------------------------- Multi-Disciplinary Care Plan Details Patient Name: [name] SpringLLOYD, [name] Date of Service: 10/27/2015 2:00 PM Medical Record Patient Account Number: [id number] Number: Treating RN: [name] SitesDorthy, [name] 05/03/1929 (82 y.o. Other Clinician: Date of Birth/Sex: Female) Treating [name] Primary Care Physician/Extender: [name] CasaG Walker, [name] Physician: Referring Physician: Jessee AversWalker, [name] Weeks in Treatment: 25 Active Inactive Electronic Signature(s) Signed: 10/27/2015 2:17:40 PM By: [name] Entered By: [name] on 10/27/2015 14:17:39 Desai, [name] ([id number]) -------------------------------------------------------------------------------- Patient/Caregiver Education Details Patient Name: [name] SpringLLOYD, [name] Date of Service: 10/27/2015 2:00 PM Medical Record Patient Account Number: [id number] Number: Treating RN: [name] SitesDorthy, [name] 05/07/1929 (82 y.o. Other Clinician: Date of Birth/Gender: Female) Treating [name] Primary Care Physician/Extender: [name] CasaG Walker, [name] Physician: Tania AdeWeeks in Treatment: 25 Referring Physician: Ronna PolioWalker, [name] Education Assessment Education Provided To: Patient and Caregiver Education Topics Provided Basic  Hygiene: Handouts: Other: care of scarred area Methods: Demonstration, Explain/Verbal Responses: State content correctly Electronic Signature(s) Signed: 10/27/2015 2:20:25 PM By: [name] Entered By: [name] on 10/27/2015 14:20:25 [name] SpringLLOYD, Rukaya O. ([id number]) -------------------------------------------------------------------------------- Wound Assessment Details Patient Name: [name] SpringLLOYD, [name] Date of Service: 10/27/2015 2:00 PM Medical Record Patient Account Number: [id number] Number: Treating RN: [name] SitesDorthy, [name] 11/14/1928 (82 y.o. Other Clinician: Date of Birth/Sex: Female) Treating [name] Primary Care Physician/Extender: [name] CasaG Walker, [name] Physician: Referring Physician: Jessee AversWalker, [name] Weeks in Treatment: 25 Wound Status Wound Number: 8 Primary Etiology: Atypical Wound Location:  Medial Abdomen - midline Wound Status: Healed - Epithelialized Wounding Event: Not Known Date Acquired: 04/23/2003 Weeks Of Treatment: 25 Clustered Wound: Yes Photos Photo Uploaded By: [name] on 10/27/2015 16:41:38 Wound Measurements Length: (cm) 0 % Reducti Width: (cm) 0 % Reducti Depth: (cm) 0 Area: (cm) 0 Volume: (cm) 0 on in Area: 100% on in Volume: 100% Wound Description Full Thickness Without Exposed Classification: Support Structures Periwound Skin Texture Texture Color No Abnormalities Noted: No No Abnormalities Noted: No Moisture No Abnormalities Noted: No SHARNIKA, BINNEY ([id number]) Electronic Signature(s) Signed: 10/27/2015 4:47:49 PM By: [name] Entered By: [name] on 10/27/2015 14:17:25 Gasper, [name] ([id number]) -------------------------------------------------------------------------------- Vitals Details Patient Name: [name] Date of Service: 10/27/2015 2:00 PM Medical Record Patient Account Number: [id number] Number: Treating RN: [name] 1929/03/12 (82 y.o. Other Clinician: Date of  Birth/Sex: Female) Treating [name] Primary Care Physician/Extender: [name] Physician: Referring Physician: Jessee [name] in Treatment: 25 Vital Signs Time Taken: 13:57 Pulse (bpm): 63 Height (in): 59 Respiratory Rate (breaths/min): 18 Weight (lbs): 180 Blood Pressure (mmHg): 119/62 Body Mass Index (BMI): 36.4 Reference Range: 80 - 120 mg / dl Electronic Signature(s) Signed: 10/27/2015 4:47:49 PM By: [name] Entered By: [name] on 10/27/2015 13:58:23

## 2015-10-29 NOTE — Telephone Encounter (Signed)
[facility] wants a verbal order for a UA and CNS for [name], pt has foul smell in urine. Please advise if okay to give verbal, thanks

## 2015-10-31 ENCOUNTER — Other Ambulatory Visit: Payer: Self-pay | Admitting: Internal Medicine

## 2015-11-02 ENCOUNTER — Encounter: Payer: Self-pay | Admitting: Internal Medicine

## 2015-11-02 ENCOUNTER — Ambulatory Visit (INDEPENDENT_AMBULATORY_CARE_PROVIDER_SITE_OTHER): Payer: Medicare Other | Admitting: Internal Medicine

## 2015-11-02 ENCOUNTER — Other Ambulatory Visit: Payer: Self-pay | Admitting: Pharmacist

## 2015-11-02 ENCOUNTER — Other Ambulatory Visit: Payer: Self-pay | Admitting: Internal Medicine

## 2015-11-02 VITALS — BP 132/86 | HR 110 | Temp 98.4°F | Resp 20 | Ht 59.0 in | Wt 193.6 lb

## 2015-11-02 DIAGNOSIS — N183 Chronic kidney disease, stage 3 unspecified: Secondary | ICD-10-CM

## 2015-11-02 DIAGNOSIS — R531 Weakness: Secondary | ICD-10-CM

## 2015-11-02 DIAGNOSIS — E1122 Type 2 diabetes mellitus with diabetic chronic kidney disease: Secondary | ICD-10-CM

## 2015-11-02 DIAGNOSIS — M179 Osteoarthritis of knee, unspecified: Secondary | ICD-10-CM | POA: Diagnosis not present

## 2015-11-02 DIAGNOSIS — Z794 Long term (current) use of insulin: Secondary | ICD-10-CM

## 2015-11-02 DIAGNOSIS — R197 Diarrhea, unspecified: Secondary | ICD-10-CM | POA: Diagnosis not present

## 2015-11-02 DIAGNOSIS — R58 Hemorrhage, not elsewhere classified: Secondary | ICD-10-CM | POA: Insufficient documentation

## 2015-11-02 DIAGNOSIS — IMO0002 Reserved for concepts with insufficient information to code with codable children: Secondary | ICD-10-CM

## 2015-11-02 DIAGNOSIS — M1710 Unilateral primary osteoarthritis, unspecified knee: Secondary | ICD-10-CM

## 2015-11-02 MED ORDER — CYANOCOBALAMIN 1000 MCG/ML IJ SOLN
1000.0000 ug | Freq: Once | INTRAMUSCULAR | Status: AC
  Administered 2015-11-02: 1000 ug via INTRAMUSCULAR

## 2015-11-02 MED ORDER — INSULIN PEN NEEDLE 32G X 6 MM MISC: Status: DC

## 2015-11-02 MED ORDER — HYDROCODONE-ACETAMINOPHEN 5-325 MG PO TABS: 1.0000 | ORAL_TABLET | Freq: Three times a day (TID) | ORAL | Status: AC | PRN

## 2015-11-02 NOTE — Assessment & Plan Note (Signed)
Symptoms of pain well controlled with prn Hydrocodone. Will continue.

## 2015-11-02 NOTE — Progress Notes (Signed)
Subjective:    Patient ID: [name], female    DOB: 06/20/1929, 82 y.o.   MRN: [id number]  HPI  82YO female presents for follow up.  Last seen in 09/2015. Not feeling well at that time, complained of generalized weakness.  Continues to have some generalized weakness. Her daughter reports that trips such as this one really tire her out. She is followed by home health.  Fell yesterday onto her right forearm and has area of bruising in this area. Home health nurse has bandaged.  Two episodes of diarrhea this morning. Watery, non-bloody. No abdominal pain. Takes Imodium with some improvement. This is a chronic issue for her.  DM - BG near 200s. Compliant with medication. No low BG. Lowest 131. Daughter reports that another family member may have been giving her too much insulin in the past.   Wt Readings from Last 3 Encounters:  11/02/15 193 lb 9.6 oz (87.816 kg)  10/16/15 189 lb 8 oz (85.957 kg)  09/17/15 194 lb 4 oz (88.111 kg)   BP Readings from Last 3 Encounters:  11/02/15 132/86  10/16/15 126/84  09/17/15 109/72    Past Medical History  Diagnosis Date  . Anxiety   . Chronic diastolic CHF (congestive heart failure) (HCC)   . Depression   . Diabetes mellitus     [name]  . GERD (gastroesophageal reflux disease)   . Hypertension   . Hypothyroidism   . Osteoporosis   . Allergy   . Hyperlipidemia   . RLS (restless legs syndrome)   . Pancreatic disease     pancreatic failure  . Urinary incontinence   . Pancreatitis     Pancreatic resection/ open wound- [facility] 11/04  . Pneumonia     Pneumonia/ Emphysema 05/05  . 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 TIA (transient ischemic attack)     recurrent 12/15  . History of pleural empyema   . Sepsis (HCC)   . Frequent falls   . Arth[facility]     [facility]  . CAD (coronary artery disease), native coronary artery      1//15  T mid-LAD   stent OM2  . Parkinson's disease (HCC)    Family History  Problem Relation Age of Onset  . Heart disease Mother   . Heart attack Mother   . Hypertension Mother   . Heart attack Father    Past Surgical History  Procedure Laterality Date  . Breast lumpectomy      Lumpectomy right breast  . 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 Abdominal hysterectomy      Hysteroscopy/ D & C ([facility]) 12/05  . Removal of pancreas  2004  . Kyphoplasty  2005  . Broken shoulder and orbital bone  2005  . Cholecystectomy  1973  . Thoracentesis    . Vaginal delivery      9  . Cardiac catheterization  08/29/2013    x1 stent @ [facility]  . Cataract surgery     Social History   Social History  . Marital Status: Widowed    Spouse Name: N/A  . Number of Children: 7  . Years of Education: 6   Occupational History  . 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 Smoking status: Never Smoker   . Smokeless tobacco: Never Used  .  Alcohol Use: No  . Drug Use: No  . Sexual Activity: Not Asked   Other Topics Concern  . None   Social History Narrative   Lives in home alone, has nursing aid, and alert device in [place]. Has 7 children.            Has [name], daughter, as health care POA   Not sure about DNR   Permission to speak to daughter-in-law, [name], regarding health care matters.    Review of Systems  Constitutional: Positive for fatigue. Negative for fever, chills, appetite change and unexpected weight change.  Eyes: Negative for visual disturbance.  Respiratory: Negative for shortness of breath.   Cardiovascular: Negative for chest pain and leg swelling.  Gastrointestinal: Positive for diarrhea. Negative for nausea, vomiting, abdominal pain, constipation and abdominal distention.  Musculoskeletal: Positive for myalgias, arthralgias and gait problem.  Skin: Negative for color change and rash.  Neurological: Positive for  tremors and weakness.  Hematological: Negative for adenopathy. Does not bruise/bleed easily.  Psychiatric/Behavioral: Negative for sleep disturbance and dysphoric mood. The patient is not nervous/anxious.        Objective:    BP 132/86 mmHg  Pulse 110  Temp(Src) 98.4 F (36.9 C) (Oral)  Resp 20  Ht  (1.499 m)  Wt 193 lb 9.6 oz (87.816 kg)  BMI 39.08 kg/m2  LMP  (LMP Unknown) Physical Exam  Constitutional: She is oriented to person, place, and time. She appears well-developed and well-nourished. No distress.  HENT:  Head: Normocephalic and atraumatic.  Right Ear: External ear normal.  Left Ear: External ear normal.  Nose: Nose normal.  Mouth/Throat: Oropharynx is clear and moist. No oropharyngeal exudate.  Eyes: Conjunctivae are normal. Pupils are equal, round, and reactive to light. Right eye exhibits no discharge. Left eye exhibits no discharge. No scleral icterus.  Neck: Normal range of motion. Neck supple. No tracheal deviation present. No thyromegaly present.  Cardiovascular: Normal rate, regular rhythm, normal heart sounds and intact distal pulses.  Exam reveals no gallop and no friction rub.   No murmur heard. Pulmonary/Chest: Effort normal and breath sounds normal. No respiratory distress. She has no wheezes. She has no rales. She exhibits no tenderness.  Musculoskeletal: Normal range of motion. She exhibits no edema or tenderness.  Lymphadenopathy:    She has no cervical adenopathy.  Neurological: She is alert and oriented to person, place, and time. She displays tremor (right>left hand). No cranial nerve deficit or sensory deficit. She exhibits normal muscle tone. Coordination normal. Abnormal gait: weak, requires [name] or wheelchair.  Skin: Skin is warm and dry. Ecchymosis (purple discoloration right forearm) noted. No rash noted. She is not diaphoretic. No erythema. No pallor.  Psychiatric: She has a normal mood and affect. Her behavior is normal. Judgment and  thought content normal.          Assessment & Plan:   Problem List Items Addressed This Visit      High   Diabetes type 2, controlled (HCC) - Primary (Chronic)    BG well controlled by report. Will plan to recheck A1c with labs at next visit. Continue Lantus and Novolog.        Unprioritized   Diarrhea    Chronic intermittent diarrhea. Well controlled generally with prn Imodium. Will continue.      Ecchymosis    Ecchymosis of right forearm at site of fall with bandage in place. Will continue to monitor through home health.      Generalized  weakness    Generalized weakness chronic for pt. Multifactorial with deconditioning. Recent labs including CBC and CMP were stable. Will continue to monitor.      Relevant Medications   cyanocobalamin ((VITAMIN B-12)) injection 1,000 mcg (Completed)   Osteoarthrosis, unspecified whether generalized or localized, involving lower leg    Symptoms of pain well controlled with prn Hydrocodone. Will continue.      Relevant Medications   HYDROcodone-acetaminophen (NORCO/VICODIN) 5-325 MG tablet       Return in about 6 weeks (around 12/14/2015) for Recheck.  Ronna Polio, MD Internal Medicine [facility]

## 2015-11-02 NOTE — Assessment & Plan Note (Signed)
Ecchymosis of right forearm at site of fall with bandage in place. Will continue to monitor through home health.

## 2015-11-02 NOTE — Assessment & Plan Note (Signed)
Generalized weakness chronic for pt. Multifactorial with deconditioning. Recent labs including CBC and CMP were stable. Will continue to monitor.

## 2015-11-02 NOTE — Assessment & Plan Note (Signed)
Chronic intermittent diarrhea. Well controlled generally with prn Imodium. Will continue.

## 2015-11-02 NOTE — Patient Outreach (Signed)
[name] is a 82 y.o. female referred to pharmacy for a medication review. Placed a call today to patient in order to confirm her current medication list prior to performing this review. Spoke with [name] who reports that she does not manage her own medications and asked/gave permission that I discuss this with her daughter, [name] [phone]).  Left a HIPAA compliant message on [name] voicemail. If have not heard from [name] by 11/04/15, will give her another call at that time.  Duanne MoronElisabeth Vida Nicol, PharmD Clinical Pharmacist [facility] [phone]

## 2015-11-02 NOTE — Patient Instructions (Addendum)
B12 shot today.  Continue wound care with home health.  Labs next visit.

## 2015-11-02 NOTE — Assessment & Plan Note (Signed)
BG well controlled by report. Will plan to recheck A1c with labs at next visit. Continue Lantus and Novolog.

## 2015-11-02 NOTE — Patient Outreach (Addendum)
[name] is a 82 y.o. female referred to pharmacy for a medication review. Place call to patient in order to confirm her current medication list prior to performing this review. Speak with [name] who reports that she does not manage her own medications and is not sure of which ones she takes. [name] reports that her medications are managed by her daughter, [name] ([phone]), who [name] reports fills a weekly pillbox for her each weekend. [name] gives me permission to contact her daughter in order to discuss the patient's medications with her.  [name] reports that she has no medication questions for me at this time. Offer to provide her with my phone number, but [name] asks that I give my number to [name] instead.  Will call patient's daughter, [name], now.  [name], PharmD Clinical Pharmacist [facility] [phone]

## 2015-11-03 ENCOUNTER — Encounter: Payer: Self-pay | Admitting: *Deleted

## 2015-11-03 ENCOUNTER — Telehealth: Payer: Self-pay | Admitting: Internal Medicine

## 2015-11-03 MED ORDER — CIPROFLOXACIN HCL 250 MG PO TABS: 250.0000 mg | ORAL_TABLET | Freq: Two times a day (BID) | ORAL | Status: DC

## 2015-11-03 NOTE — Telephone Encounter (Signed)
UA and culture performed by home health were abnormal, showing infection with mixed bacteria. Please have pt start Cipro 250mg  po bid. I have sent in Rx. Please have pt resubmit a urine culture.

## 2015-11-03 NOTE — Telephone Encounter (Signed)
Called pt several times, no answer.  Sent mychart message with information

## 2015-11-04 ENCOUNTER — Telehealth: Payer: Self-pay | Admitting: Internal Medicine

## 2015-11-04 ENCOUNTER — Other Ambulatory Visit: Payer: Self-pay | Admitting: Pharmacist

## 2015-11-04 NOTE — Patient Outreach (Signed)
Call patient's Cardiologist, Dr. Graciela HusbandsKlein, regarding need to reschedule appointment to discuss patient's atrial fibrillation and anticoagulation.   Spoke with [name] in the office who notes that appointment on 10/15/15 to discuss patient's atrial fibrillation and anticoagulation was missed and needs to be rescheduled. States that she will call the patient in the morning to follow up about rescheduling this now.  [name], PharmD Clinical Pharmacist [facility] [phone]

## 2015-11-04 NOTE — Patient Outreach (Addendum)
[name]) Care Management  [place]   11/04/2015  [name] [birth year] [id number]  Subjective: [name] is a 82 y.o. female referred to pharmacy for a medication review. Placed a call to patient on 11/02/15 in order to confirm her current medication list prior to performing this review. Spoke with [name] who reported that she does not manage her own medications and asked/gave permission that I discuss this with her daughter/caregiver, [name] [phone]).  Received a call back from patient's daughter/caregiver, [name], today. Review patient's medication list with [name] and update the EPIC medication list record accordingly. [name] reports that she received a call from the patient's pharmacy today that the patient had a new antibiotic prescription to pick up. Counsel [name] on this new prescription for ciprofloxacin for the patient, including letting her know to have the patient separate the doses from her multivitamin, complete the full course and let her provider know immediately if any tendon pain were to occur.   Discuss with [name] medications that may put the patient at increased risk of sedation, dizziness or falls, including hydrocodone and alprazolam. [name] reports that patient has had two falls in the past few weeks, one because she missed her chair as she was sitting down and the other when she was getting out of bed and reaching for her walker. Reports that she also discussed these falls and fall concerns with Dr. Gilford Rile at the patient's office visit on 11/02/15. Reports that Dr. Gilford Rile discussed with them the potential contribution of carbidopa/levodopa dose increases to this fall risk. [name] reports that patient takes Norco on a scheduled rather than as needed basis, taking 2 tablets each morning and 1 tablet every evening. [name] reports that she will discuss with the patient using the medication just when needed for pain to reduce her  risk of falling.  [name] reports that the patient has been having ongoing issues with diarrhea. Reports that she and the patient discussed this with Dr. Gilford Rile at their visit this week. Reports that the patient has also seen a gastroenterologist about this problem in the past, but believes that the patient missed her last follow up visit with this provider and that they did not reschedule. Advise [name] to consider calling to reschedule this now.  [name] reports that she has no further questions on behalf of the patient at this time. Confirm that patient has my phone number for future questions.  Objective:   Current Medications: Current Outpatient Prescriptions  Medication Sig Dispense Refill  . albuterol (PROVENTIL HFA;VENTOLIN HFA) 108 (90 BASE) MCG/ACT inhaler Inhale 2 puffs into the lungs every 6 (six) hours as needed for wheezing or shortness of breath. 18 g 3  . ALPRAZolam (XANAX) 1 MG tablet TAKE 1/2 TABLET BY MOUTH IN THE MORNING,1/2 TABLET AT LUNCH AND 1 TABLET AT BEDTIME IF NEEDED 60 tablet 1  . amLODipine (NORVASC) 5 MG tablet Take 1 tablet (5 mg total) by mouth daily. 30 tablet 5  . budesonide (ENTOCORT EC) 3 MG 24 hr capsule Take 9 mg by mouth daily.     . busPIRone (BUSPAR) 10 MG tablet Take 10 mg by mouth 2 (two) times daily.    . carbidopa-levodopa (SINEMET IR) 25-100 MG tablet Take 2 tablets by mouth 2 (two) times daily.    . cetirizine (ZYRTEC) 10 MG tablet Take 10 mg by mouth daily as needed for allergies.     . ciprofloxacin (CIPRO) 250 MG tablet Take 250 mg by  mouth 2 (two) times daily.    . clopidogrel (PLAVIX) 75 MG tablet TAKE ONE (1) TABLET EACH DAY WITH BREAKFAST 30 tablet 0  . cyanocobalamin (,VITAMIN B-12,) 1000 MCG/ML injection Inject 1 mL (1,000 mcg total) into the muscle every 30 (thirty) days. 30 mL 3  . fluticasone (FLONASE) 50 MCG/ACT nasal spray Place 2 sprays into both nostrils daily as needed for rhinitis.     . furosemide (LASIX) 40 MG tablet Take 1 tablet  (40 mg total) by mouth daily. 90 tablet 3  . HYDROcodone-acetaminophen (NORCO/VICODIN) 5-325 MG tablet Take 1-2 tablets by mouth 3 (three) times daily as needed for moderate pain. 120 tablet 0  . insulin glargine (LANTUS) 100 UNIT/ML injection Inject 0.45 mLs (45 Units total) into the skin at bedtime. 10 mL 11  . insulin lispro (HUMALOG) 100 UNIT/ML injection Inject 0.08-0.18 mLs (8-18 Units total) into the skin 3 (three) times daily. Pt uses per sliding scale. 10 mL 11  . Insulin Pen Needle 32G X 6 MM MISC Use as directed 100 each 11  . levothyroxine (SYNTHROID, LEVOTHROID) 75 MCG tablet Take 75 mcg by mouth daily.    . metoprolol (TOPROL-XL) 200 MG 24 hr tablet Take 200 mg by mouth daily.     . Multiple Vitamin (MULTIVITAMIN WITH MINERALS) TABS tablet Take 1 tablet by mouth daily.    Marland Kitchen omeprazole (PRILOSEC) 20 MG capsule Take 1 capsule (20 mg total) by mouth daily. 90 capsule 3  . pravastatin (PRAVACHOL) 40 MG tablet Take 40 mg by mouth daily.     Marland Kitchen ZENPEP 15000 units CPEP TAKE 1 CAPSULE BY MOUTH THREE TIMES A DAY 100 capsule 1  . ondansetron (ZOFRAN) 4 MG tablet Take 1 tablet (4 mg total) by mouth every 4 (four) hours as needed for nausea or vomiting. (Patient not taking: Reported on 11/04/2015) 40 tablet 3  . saccharomyces boulardii (FLORASTOR) 250 MG capsule Take 250 mg by mouth daily. Reported on 11/04/2015    . tiZANidine (ZANAFLEX) 4 MG tablet Take 1 tablet (4 mg total) by mouth 3 (three) times daily. (Patient not taking: Reported on 11/04/2015) 15 tablet 0   No current facility-administered medications for this visit.    Assessment:  Drugs sorted by system:  Neurologic/Psychologic: alprazolam, buspirone, carbidopa-levodopa  Cardiovascular: amlodipine, clopidogrel, furosemide, metoprolol, pravastatin  Pulmonary/Allergy: albuterol, cetirizine, fluticasone  Gastrointestinal: Entocort, omeprazole, Zenpep  Endocrine: Lantus, Humalog, levothyroxine  Pain: Norco  Vitamins/Minerals:  Vitamin B12, multivitamin  Infectious Diseases: ciprofloxacin   Duplications in therapy: none noted Gaps in therapy:  . Per EPIC Cardiology note on 10/09/15, patient with atrial fibrillation not on anticoagulation to be seen for follow up appointment with Cardiology on 10/15/15 to discuss anticoagulation. No office visit with Cardiology noted for this day. Will follow up with Cardiology. Medications to avoid in the elderly: alprazolam, hydrocodone (in patients with falls) Drug interactions:  . Ciprofloxacin + multivitamin: multivitamins may decrease the serum concentration of ciprofloxacin. Interactions can be minimized by administering ciprofloxacin at least 2 hours before, or 6 hours after, the dose of a multivitamin. Interaction minimized as daughter counseled to have patient separate these doses from her multivitamin. . Levothyroxine + multivitamin: multivitamins may decrease the serum concentration of levothyroxine. Interaction minimized as daughter reports patient separates these medications, taking levothyroxine first thing in morning before breakfast and multivitamin at noon. Lebron Quam + buspirone + alprazolam + Zyrtec:CNS Depressants may enhance the CNS depressant effect of other CNS depressants. . Levodopa + multivitamin: Multivitamins may decrease the  serum concentration of Levodopa. Interaction minimized as daughter reports patient separates these medications, taking carbidopa/levodopa in morning and evening, then takes multivitamin at noon.  Clopidogrel + omeprazole:Clopidogrel prescribing information recommends avoiding concurrent use with omeprazole and other proton pump inhibitors (PPIs), due to the possibility that combined use may result in decreased clopidogrel effectiveness. However, this interaction is debated and the 2013 Guidelines for the diagnosis and management of gastroesophageal reflux disease state there is not an increase of cardiovascular events in patients using clopidogrel  with a PPI.  Other issues noted:  . Note per EPIC, on 10/16/15, patient had a serum creatinine of 1.09 mg/dL, eGFR of 50.80 mL/min. Dosing reviewed.  Plan:  1) Patient to start and complete ciprofloxacin course as prescribed by Dr. Gilford Rile, separating administration from her multivitamin. 2) Will call patient's Cardiologist, Dr. Caryl Comes, regarding need to reschedule appointment to discuss atrial fibrillation and anticoagulation.  3) Will send a letter within this encounter to patient's PCP regarding patient's recent falls and use of medications such as alprazolam and hydrocodone that may put her at increased risk. 4) Will close pharmacy episode at this time.   Harlow Asa, PharmD Clinical Pharmacist [facility] [phone]

## 2015-11-04 NOTE — Telephone Encounter (Signed)
Error

## 2015-11-04 NOTE — Patient Outreach (Signed)
[name] is a 82 y.o. female referred to pharmacy for a medication review. Placed a call to patient on 11/02/15 in order to confirm her current medication list prior to performing this review. Spoke with [name] who reported that she does not manage her own medications and asked/gave permission that I discuss this with her daughter, [name] [phone]).  Call and speak with [name] who reports that she does not have her list of her mother's medications with her right now as she is at work. However, [name] asks if she can call me back later in the day.   If have not heard back from [place] by the end of the day, a pharmacist will follow up with [name] again within the next week.  Duanne MoronElisabeth Soriya Worster, PharmD Clinical Pharmacist [facility] Management [phone]

## 2015-11-06 ENCOUNTER — Telehealth: Payer: Self-pay

## 2015-11-06 NOTE — Telephone Encounter (Signed)
[name] wants to know if she repeat [name] repeat U/A and culture for her UTI. Please advise, thanks

## 2015-11-06 NOTE — Telephone Encounter (Signed)
Fine to repeat UA and culture

## 2015-11-09 NOTE — Telephone Encounter (Signed)
Verbal order given  

## 2015-11-10 ENCOUNTER — Encounter: Payer: Self-pay | Admitting: Internal Medicine

## 2015-11-10 ENCOUNTER — Ambulatory Visit (INDEPENDENT_AMBULATORY_CARE_PROVIDER_SITE_OTHER): Payer: Medicare Other | Admitting: *Deleted

## 2015-11-10 DIAGNOSIS — I6389 Other cerebral infarction: Secondary | ICD-10-CM

## 2015-11-10 DIAGNOSIS — I638 Other cerebral infarction: Secondary | ICD-10-CM

## 2015-11-11 ENCOUNTER — Other Ambulatory Visit: Payer: Self-pay | Admitting: *Deleted

## 2015-11-11 MED ORDER — AMOXICILLIN-POT CLAVULANATE 500-125 MG PO TABS: 1.0000 | ORAL_TABLET | Freq: Two times a day (BID) | ORAL | Status: DC

## 2015-11-11 NOTE — Telephone Encounter (Signed)
Fine to give verbal.  

## 2015-11-11 NOTE — Progress Notes (Signed)
[facility] Summary Report / Loop Recorder 

## 2015-11-11 NOTE — Telephone Encounter (Signed)
Per Urine culture results collected on 11/06/15, received results from [facility] on 11/11/15, medication sent as requested by Dr Dan HumphreysWalker.

## 2015-11-11 NOTE — Telephone Encounter (Signed)
[facility] is requesting a continuation of care, based on [name]'s recent fall, UTI, and other issues. Okay to give verbal orders? Please advise, thanks

## 2015-11-12 ENCOUNTER — Telehealth: Payer: Self-pay

## 2015-11-12 NOTE — Telephone Encounter (Signed)
[facility] called and stated that [name] UA culture showed Ecoli in her urine. [facility] is asking for orders for treatment. Please advise, thanks

## 2015-11-12 NOTE — Telephone Encounter (Signed)
See note from yesterday. We called in Augmentin for her to start.

## 2015-11-12 NOTE — Telephone Encounter (Signed)
Noted  

## 2015-11-13 ENCOUNTER — Telehealth: Payer: Self-pay

## 2015-11-13 NOTE — Telephone Encounter (Signed)
Spoke with Harriett SineNancy and they will fax over new orders to sign on Monday. thanks

## 2015-11-13 NOTE — Telephone Encounter (Signed)
Fine to continue home health 

## 2015-11-13 NOTE — Telephone Encounter (Signed)
Bennetta LaosNancy Wilson from [facility] called the triage line to get new orders for 60 day re certification for Occupational therapy for patient.  She will fax a paper next week, is requesting a verbal order today.  Please advise. Thanks

## 2015-11-16 ENCOUNTER — Telehealth: Payer: Self-pay

## 2015-11-16 NOTE — Telephone Encounter (Signed)
Can we add her at 12noon?

## 2015-11-16 NOTE — Telephone Encounter (Signed)
Pt has already cancelled appt for tomorrow with another doctor, pt does not have transportation to get here today, do you have another time we can see her? Please advise, thanks

## 2015-11-16 NOTE — Telephone Encounter (Signed)
Ms [name] coming Friday 4pm

## 2015-11-16 NOTE — Telephone Encounter (Signed)
There are many openings on Wed, Thurs, and Friday. Please work with her to figure out a good time

## 2015-11-16 NOTE — Telephone Encounter (Signed)
FYI: [name] states that [name] has not been feeling well, she was given and extra 40 mg of lasix for SOB over the weekend. Pt has crackles and wheezes through out the both lungs, pt does not have a productive cough, and feels like she may have caught a cold from her daughters.

## 2015-11-18 ENCOUNTER — Encounter: Payer: Self-pay | Admitting: Emergency Medicine

## 2015-11-18 ENCOUNTER — Inpatient Hospital Stay
Admission: EM | Admit: 2015-11-18 | Discharge: 2015-11-21 | DRG: 189 | Disposition: A | Discharge status: Home / Self Care | Payer: Medicare Other | Attending: Internal Medicine | Admitting: Internal Medicine

## 2015-11-18 ENCOUNTER — Emergency Department: Payer: Medicare Other

## 2015-11-18 ENCOUNTER — Telehealth: Payer: Self-pay | Admitting: Internal Medicine

## 2015-11-18 DIAGNOSIS — Z9049 Acquired absence of other specified parts of digestive tract: Secondary | ICD-10-CM | POA: Diagnosis not present

## 2015-11-18 DIAGNOSIS — E876 Hypokalemia: Secondary | ICD-10-CM

## 2015-11-18 DIAGNOSIS — Z90710 Acquired absence of both cervix and uterus: Secondary | ICD-10-CM

## 2015-11-18 DIAGNOSIS — Z888 Allergy status to other drugs, medicaments and biological substances status: Secondary | ICD-10-CM | POA: Diagnosis not present

## 2015-11-18 DIAGNOSIS — G2581 Restless legs syndrome: Secondary | ICD-10-CM | POA: Diagnosis present

## 2015-11-18 DIAGNOSIS — E119 Type 2 diabetes mellitus without complications: Secondary | ICD-10-CM | POA: Diagnosis present

## 2015-11-18 DIAGNOSIS — I5032 Chronic diastolic (congestive) heart failure: Secondary | ICD-10-CM | POA: Diagnosis present

## 2015-11-18 DIAGNOSIS — G20 Parkinson's disease: Secondary | ICD-10-CM | POA: Diagnosis present

## 2015-11-18 DIAGNOSIS — Z8249 Family history of ischemic heart disease and other diseases of the circulatory system: Secondary | ICD-10-CM | POA: Diagnosis not present

## 2015-11-18 DIAGNOSIS — I2510 Atherosclerotic heart disease of native coronary artery without angina pectoris: Secondary | ICD-10-CM | POA: Diagnosis present

## 2015-11-18 DIAGNOSIS — J449 Chronic obstructive pulmonary disease, unspecified: Secondary | ICD-10-CM

## 2015-11-18 DIAGNOSIS — E039 Hypothyroidism, unspecified: Secondary | ICD-10-CM | POA: Diagnosis present

## 2015-11-18 DIAGNOSIS — Z794 Long term (current) use of insulin: Secondary | ICD-10-CM | POA: Diagnosis not present

## 2015-11-18 DIAGNOSIS — Z66 Do not resuscitate: Secondary | ICD-10-CM | POA: Diagnosis present

## 2015-11-18 DIAGNOSIS — E785 Hyperlipidemia, unspecified: Secondary | ICD-10-CM | POA: Diagnosis present

## 2015-11-18 DIAGNOSIS — K219 Gastro-esophageal reflux disease without esophagitis: Secondary | ICD-10-CM | POA: Diagnosis present

## 2015-11-18 DIAGNOSIS — Z79899 Other long term (current) drug therapy: Secondary | ICD-10-CM | POA: Diagnosis not present

## 2015-11-18 DIAGNOSIS — Z8673 Personal history of transient ischemic attack (TIA), and cerebral infarction without residual deficits: Secondary | ICD-10-CM

## 2015-11-18 DIAGNOSIS — M810 Age-related osteoporosis without current pathological fracture: Secondary | ICD-10-CM | POA: Diagnosis present

## 2015-11-18 DIAGNOSIS — N183 Chronic kidney disease, stage 3 (moderate): Secondary | ICD-10-CM

## 2015-11-18 DIAGNOSIS — J9620 Acute and chronic respiratory failure, unspecified whether with hypoxia or hypercapnia: Secondary | ICD-10-CM | POA: Diagnosis not present

## 2015-11-18 DIAGNOSIS — E1122 Type 2 diabetes mellitus with diabetic chronic kidney disease: Secondary | ICD-10-CM

## 2015-11-18 DIAGNOSIS — I110 Hypertensive heart disease with heart failure: Secondary | ICD-10-CM | POA: Diagnosis present

## 2015-11-18 DIAGNOSIS — E669 Obesity, unspecified: Secondary | ICD-10-CM | POA: Diagnosis present

## 2015-11-18 DIAGNOSIS — K921 Melena: Secondary | ICD-10-CM | POA: Diagnosis present

## 2015-11-18 DIAGNOSIS — I509 Heart failure, unspecified: Secondary | ICD-10-CM

## 2015-11-18 DIAGNOSIS — J441 Chronic obstructive pulmonary disease with (acute) exacerbation: Secondary | ICD-10-CM | POA: Diagnosis present

## 2015-11-18 DIAGNOSIS — K922 Gastrointestinal hemorrhage, unspecified: Secondary | ICD-10-CM

## 2015-11-18 DIAGNOSIS — Z6838 Body mass index (BMI) 38.0-38.9, adult: Secondary | ICD-10-CM

## 2015-11-18 DIAGNOSIS — T380X5A Adverse effect of glucocorticoids and synthetic analogues, initial encounter: Secondary | ICD-10-CM | POA: Diagnosis present

## 2015-11-18 LAB — COMPREHENSIVE METABOLIC PANEL
ALK PHOS: 53 U/L (ref 38–126)
ALT: 13 U/L — AB (ref 14–54)
AST: 31 U/L (ref 15–41)
Albumin: 2.7 g/dL — ABNORMAL LOW (ref 3.5–5.0)
Anion gap: 9 (ref 5–15)
BILIRUBIN TOTAL: 0.6 mg/dL (ref 0.3–1.2)
BUN: 19 mg/dL (ref 6–20)
CALCIUM: 8.6 mg/dL — AB (ref 8.9–10.3)
CHLORIDE: 100 mmol/L — AB (ref 101–111)
CO2: 33 mmol/L — ABNORMAL HIGH (ref 22–32)
CREATININE: 0.88 mg/dL (ref 0.44–1.00)
GFR, EST NON AFRICAN AMERICAN: 59 mL/min — AB (ref >60)
Glucose, Bld: 187 mg/dL — ABNORMAL HIGH (ref 65–99)
Potassium: 3.2 mmol/L — ABNORMAL LOW (ref 3.5–5.1)
Sodium: 142 mmol/L (ref 135–145)
TOTAL PROTEIN: 6.2 g/dL — AB (ref 6.5–8.1)

## 2015-11-18 LAB — GLUCOSE, CAPILLARY
GLUCOSE-CAPILLARY: 196 mg/dL — AB (ref 65–99)
GLUCOSE-CAPILLARY: 262 mg/dL — AB (ref 65–99)

## 2015-11-18 LAB — TROPONIN I: TROPONIN I: 0.03 ng/mL (ref <0.031)

## 2015-11-18 LAB — BRAIN NATRIURETIC PEPTIDE: B NATRIURETIC PEPTIDE 5: 148 pg/mL — AB (ref 0.0–100.0)

## 2015-11-18 LAB — CBC WITH DIFFERENTIAL/PLATELET
BASOS ABS: 0 10*3/uL (ref 0–0.1)
Basophils Relative: 0 %
EOS PCT: 1 %
Eosinophils Absolute: 0.1 10*3/uL (ref 0–0.7)
HEMATOCRIT: 41.7 % (ref 35.0–47.0)
HEMOGLOBIN: 14.1 g/dL (ref 12.0–16.0)
LYMPHS ABS: 1.3 10*3/uL (ref 1.0–3.6)
LYMPHS PCT: 19 %
MCH: 31.1 pg (ref 26.0–34.0)
MCHC: 33.7 g/dL (ref 32.0–36.0)
MCV: 92.2 fL (ref 80.0–100.0)
Monocytes Absolute: 0.5 10*3/uL (ref 0.2–0.9)
Monocytes Relative: 7 %
Neutro Abs: 5.2 10*3/uL (ref 1.4–6.5)
Neutrophils Relative %: 73 %
PLATELETS: 139 10*3/uL — AB (ref 150–440)
RBC: 4.53 MIL/uL (ref 3.80–5.20)
RDW: 14.3 % (ref 11.5–14.5)
WBC: 7.1 10*3/uL (ref 3.6–11.0)

## 2015-11-18 LAB — PROTIME-INR
INR: 1.03
Prothrombin Time: 13.7 seconds (ref 11.4–15.0)

## 2015-11-18 LAB — BLOOD GAS, ARTERIAL
ACID-BASE EXCESS: 10.6 mmol/L — AB (ref 0.0–3.0)
BICARBONATE: 36.3 meq/L — AB (ref 21.0–28.0)
FIO2: 0.28
O2 Saturation: 97.5 %
PCO2 ART: 51 mmHg — AB (ref 32.0–48.0)
PH ART: 7.46 — AB (ref 7.350–7.450)
Patient temperature: 37
pO2, Arterial: 91 mmHg (ref 83.0–108.0)

## 2015-11-18 MED ORDER — INSULIN ASPART 100 UNIT/ML ~~LOC~~ SOLN
0.0000 [IU] | Freq: Every day | SUBCUTANEOUS | Status: DC
  Administered 2015-11-18 – 2015-11-20 (×3): 3 [IU] via SUBCUTANEOUS
  Filled 2015-11-18 (×2): qty 3
  Filled 2015-11-18: qty 5
  Filled 2015-11-18: qty 3

## 2015-11-18 MED ORDER — LEVOTHYROXINE SODIUM 75 MCG PO TABS
75.0000 ug | ORAL_TABLET | Freq: Every day | ORAL | Status: DC
  Administered 2015-11-19 – 2015-11-21 (×3): 75 ug via ORAL
  Filled 2015-11-18 (×3): qty 1

## 2015-11-18 MED ORDER — AMLODIPINE BESYLATE 5 MG PO TABS
5.0000 mg | ORAL_TABLET | Freq: Every day | ORAL | Status: DC
  Administered 2015-11-19 – 2015-11-21 (×3): 5 mg via ORAL
  Filled 2015-11-18 (×3): qty 1

## 2015-11-18 MED ORDER — AZITHROMYCIN 250 MG PO TABS
500.0000 mg | ORAL_TABLET | Freq: Every day | ORAL | Status: AC
  Administered 2015-11-18: 18:00:00 500 mg via ORAL
  Filled 2015-11-18: qty 2

## 2015-11-18 MED ORDER — ADULT MULTIVITAMIN W/MINERALS CH
1.0000 | ORAL_TABLET | Freq: Every day | ORAL | Status: DC
  Administered 2015-11-19 – 2015-11-21 (×3): 1 via ORAL
  Filled 2015-11-18 (×3): qty 1

## 2015-11-18 MED ORDER — CYANOCOBALAMIN 1000 MCG/ML IJ SOLN
1000.0000 ug | INTRAMUSCULAR | Status: DC
  Administered 2015-11-18: 1000 ug via INTRAMUSCULAR
  Filled 2015-11-18: qty 1

## 2015-11-18 MED ORDER — BUSPIRONE HCL 10 MG PO TABS
10.0000 mg | ORAL_TABLET | Freq: Two times a day (BID) | ORAL | Status: DC
  Administered 2015-11-18 – 2015-11-21 (×6): 10 mg via ORAL
  Filled 2015-11-18 (×6): qty 1

## 2015-11-18 MED ORDER — ACETAMINOPHEN 650 MG RE SUPP: 650.0000 mg | Freq: Four times a day (QID) | RECTAL | Status: DC | PRN

## 2015-11-18 MED ORDER — SACCHAROMYCES BOULARDII 250 MG PO CAPS
250.0000 mg | ORAL_CAPSULE | Freq: Every day | ORAL | Status: DC
  Administered 2015-11-19 – 2015-11-21 (×3): 250 mg via ORAL
  Filled 2015-11-18 (×4): qty 1

## 2015-11-18 MED ORDER — PANTOPRAZOLE SODIUM 40 MG PO TBEC
40.0000 mg | DELAYED_RELEASE_TABLET | Freq: Every day | ORAL | Status: DC
  Administered 2015-11-19 – 2015-11-21 (×3): 40 mg via ORAL
  Filled 2015-11-18 (×3): qty 1

## 2015-11-18 MED ORDER — PANCRELIPASE (LIP-PROT-AMYL) 12000-38000 UNITS PO CPEP
12000.0000 [IU] | ORAL_CAPSULE | Freq: Three times a day (TID) | ORAL | Status: DC
  Administered 2015-11-18 – 2015-11-21 (×9): 12000 [IU] via ORAL
  Filled 2015-11-18 (×9): qty 1

## 2015-11-18 MED ORDER — AZITHROMYCIN 250 MG PO TABS
250.0000 mg | ORAL_TABLET | Freq: Every day | ORAL | Status: DC
  Administered 2015-11-19 – 2015-11-21 (×3): 250 mg via ORAL
  Filled 2015-11-18 (×3): qty 1

## 2015-11-18 MED ORDER — CARBIDOPA-LEVODOPA 25-100 MG PO TABS
1.0000 | ORAL_TABLET | Freq: Three times a day (TID) | ORAL | Status: DC
  Administered 2015-11-18 – 2015-11-21 (×9): 1 via ORAL
  Filled 2015-11-18 (×9): qty 1

## 2015-11-18 MED ORDER — LORATADINE 10 MG PO TABS
10.0000 mg | ORAL_TABLET | Freq: Every day | ORAL | Status: DC
  Administered 2015-11-19 – 2015-11-21 (×3): 10 mg via ORAL
  Filled 2015-11-18 (×3): qty 1

## 2015-11-18 MED ORDER — ENOXAPARIN SODIUM 40 MG/0.4ML ~~LOC~~ SOLN
40.0000 mg | SUBCUTANEOUS | Status: DC
  Administered 2015-11-18: 21:00:00 40 mg via SUBCUTANEOUS
  Filled 2015-11-18: qty 0.4

## 2015-11-18 MED ORDER — ONDANSETRON HCL 4 MG/2ML IJ SOLN: 4.0000 mg | Freq: Four times a day (QID) | INTRAMUSCULAR | Status: DC | PRN

## 2015-11-18 MED ORDER — INSULIN GLARGINE 100 UNIT/ML ~~LOC~~ SOLN
70.0000 [IU] | Freq: Every day | SUBCUTANEOUS | Status: DC
  Administered 2015-11-18 – 2015-11-20 (×3): 70 [IU] via SUBCUTANEOUS
  Filled 2015-11-18 (×4): qty 0.7

## 2015-11-18 MED ORDER — METHYLPREDNISOLONE SODIUM SUCC 40 MG IJ SOLR
40.0000 mg | Freq: Two times a day (BID) | INTRAMUSCULAR | Status: DC
Start: 2015-11-18 — End: 2015-11-20
  Administered 2015-11-18 – 2015-11-20 (×4): 40 mg via INTRAVENOUS
  Filled 2015-11-18 (×4): qty 1

## 2015-11-18 MED ORDER — IPRATROPIUM-ALBUTEROL 0.5-2.5 (3) MG/3ML IN SOLN
3.0000 mL | Freq: Four times a day (QID) | RESPIRATORY_TRACT | Status: DC
  Administered 2015-11-18 – 2015-11-21 (×11): 3 mL via RESPIRATORY_TRACT
  Filled 2015-11-18 (×11): qty 3

## 2015-11-18 MED ORDER — ONDANSETRON HCL 4 MG PO TABS: 4.0000 mg | ORAL_TABLET | Freq: Four times a day (QID) | ORAL | Status: DC | PRN

## 2015-11-18 MED ORDER — POTASSIUM CHLORIDE CRYS ER 20 MEQ PO TBCR
40.0000 meq | EXTENDED_RELEASE_TABLET | Freq: Once | ORAL | Status: AC
  Administered 2015-11-18: 40 meq via ORAL
  Filled 2015-11-18: qty 2

## 2015-11-18 MED ORDER — INSULIN GLARGINE 100 UNITS/ML SOLOSTAR PEN
70.0000 [IU] | PEN_INJECTOR | Freq: Every day | SUBCUTANEOUS | Status: DC
  Filled 2015-11-18: qty 3

## 2015-11-18 MED ORDER — INSULIN ASPART 100 UNIT/ML ~~LOC~~ SOLN
0.0000 [IU] | Freq: Three times a day (TID) | SUBCUTANEOUS | Status: DC
  Administered 2015-11-18: 3 [IU] via SUBCUTANEOUS
  Administered 2015-11-19: 17:00:00 8 [IU] via SUBCUTANEOUS
  Administered 2015-11-19: 12:00:00 11 [IU] via SUBCUTANEOUS
  Administered 2015-11-19: 8 [IU] via SUBCUTANEOUS
  Administered 2015-11-20 (×2): 5 [IU] via SUBCUTANEOUS
  Administered 2015-11-20: 8 [IU] via SUBCUTANEOUS
  Administered 2015-11-21: 08:00:00 5 [IU] via SUBCUTANEOUS
  Filled 2015-11-18: qty 11
  Filled 2015-11-18: qty 5
  Filled 2015-11-18: qty 3
  Filled 2015-11-18: qty 8
  Filled 2015-11-18: qty 5
  Filled 2015-11-18 (×2): qty 8

## 2015-11-18 MED ORDER — ACETAMINOPHEN 325 MG PO TABS: 650.0000 mg | ORAL_TABLET | Freq: Four times a day (QID) | ORAL | Status: DC | PRN

## 2015-11-18 MED ORDER — FUROSEMIDE 40 MG PO TABS
40.0000 mg | ORAL_TABLET | Freq: Every day | ORAL | Status: DC
  Administered 2015-11-19 – 2015-11-21 (×3): 40 mg via ORAL
  Filled 2015-11-18 (×4): qty 1

## 2015-11-18 MED ORDER — PRAVASTATIN SODIUM 40 MG PO TABS
40.0000 mg | ORAL_TABLET | Freq: Every day | ORAL | Status: DC
  Administered 2015-11-19 – 2015-11-21 (×3): 40 mg via ORAL
  Filled 2015-11-18 (×3): qty 1

## 2015-11-18 MED ORDER — BUDESONIDE 0.25 MG/2ML IN SUSP
0.2500 mg | Freq: Two times a day (BID) | RESPIRATORY_TRACT | Status: DC
  Administered 2015-11-19 – 2015-11-21 (×5): 0.25 mg via RESPIRATORY_TRACT
  Filled 2015-11-18 (×6): qty 2

## 2015-11-18 MED ORDER — HYDROCODONE-ACETAMINOPHEN 5-325 MG PO TABS
1.0000 | ORAL_TABLET | Freq: Three times a day (TID) | ORAL | Status: DC | PRN
  Administered 2015-11-19 – 2015-11-20 (×3): 1 via ORAL
  Filled 2015-11-18 (×3): qty 1

## 2015-11-18 MED ORDER — IPRATROPIUM-ALBUTEROL 0.5-2.5 (3) MG/3ML IN SOLN
3.0000 mL | Freq: Once | RESPIRATORY_TRACT | Status: AC
  Administered 2015-11-18: 3 mL via RESPIRATORY_TRACT
  Filled 2015-11-18: qty 3

## 2015-11-18 MED ORDER — ALPRAZOLAM 0.5 MG PO TABS
0.5000 mg | ORAL_TABLET | Freq: Three times a day (TID) | ORAL | Status: DC | PRN
  Administered 2015-11-20: 22:00:00 0.5 mg via ORAL
  Filled 2015-11-18: qty 1

## 2015-11-18 MED ORDER — DIPHENHYDRAMINE HCL 50 MG/ML IJ SOLN
12.5000 mg | Freq: Once | INTRAMUSCULAR | Status: AC
  Administered 2015-11-18: 12.5 mg via INTRAVENOUS
  Filled 2015-11-18: qty 1

## 2015-11-18 NOTE — Telephone Encounter (Signed)
[facility] [place] Day - [place] TELEPHONE ADVICE RECORD [facility] Patient Name: Theola SequinCLARA Sorbo DOB: 01/25/1929 Initial Comment caller states her mother had a bowel movement with alot of blood in it this am Nurse Assessment Nurse: Lane HackerHarley, RN, Elvin SoWindy Date/Time (Eastern Time): 11/18/2015 9:42:05 AM Confirm and document reason for call. If symptomatic, describe symptoms. You must click the next button to save text entered. ---Caller states her mother had a bowel movement with a lot of blood in it this am. -- She is not with her currently, but had gotten a text from another family member who was notified by [facility] of this situation. -- Warm conf. pt into the call. HH aide, Nicholos JohnsKathleen. When wiped it seemed to be more vaginal bleeding. She c/o feeling cold. Has the patient traveled out of the country within the last 30 days? ---Not Applicable Does the patient have any new or worsening symptoms? ---Yes Will a triage be completed? ---Yes Related visit to physician within the last 2 weeks? ---No Does the PT have any chronic conditions? (i.e. diabetes, asthma, etc.) ---Yes List chronic conditions. ---COPD, IDDM; Heart problems; HTN; Anxiety; Vaginal bleeding a few yrs ago, but nothing found wrong; Blood thinner - unknown which one, but does not think that it is Coumadin. Is this a behavioral health or substance abuse call? ---No Guidelines Guideline Title Affirmed Question Affirmed Notes Vaginal Bleeding - Postmenopausal Taking Coumadin (warfarin) or other strong blood thinner, or known bleeding disorder (e.g., thrombocytopenia) Cough - Acute Productive Difficulty breathing Final Disposition User See Physician within [address] [place], [place], [place] Comments On antibiotics for UTI recently, finished last night. No pain with urination. She has not had her sugar tested this AM. Feels like a low sugar spell. Congested coughing and nurse yesterday said 99% on  Oxygen 2 L/ [place] and that Lungs were noisy. Usually oxygen just at night. Appt made for Friday already. - Some shortness of breath, and wheezing. She uses Albuterol inhaler - last used 8 am.  New cough in last week. [facility] Disagree/Comply: Comply

## 2015-11-18 NOTE — ED Notes (Signed)
Pt with allergy to benadryl; pt reports reaction is anxiety like symptoms.  MD aware of reaction.  Pt advised of possible reaction, pt expresses that she wishes to take the benadryl at this time.

## 2015-11-18 NOTE — ED Provider Notes (Signed)
[facility] Emergency Department Provider Note  ____________________________________________  Time seen: Approximately 12:09 PM  I have reviewed the triage vital signs and the nursing notes.   HISTORY  Chief Complaint Rectal Bleeding    HPI [name] is a 82 y.o. female who was sent over for bright red rectal bleeding. She also has been having progressive worsening shortness of breath and wheezing over the past few days as well. There was a question 1. whether the blood was coming from her rectum or her vagina. He is patient denies any significant abdominal pain but states she has had diarrhea off and on for the past month. Patient also states that she's been on antibiotics for 4 weeks for urinary tract infection. Patient states that she's run a low-grade fever but is not had a fever or chills today. She denies any vomiting or chest pain. She does not feel that she has had some mild worsening shortness of breath and cough but is nonproductive at this time.   Past Medical History  Diagnosis Date  . Anxiety   . Chronic diastolic CHF (congestive heart failure) (HCC)   . Depression   . Diabetes mellitus     Followed by Dr. Tedd Sias at [facility]  . GERD (gastroesophageal reflux disease)   . Hypertension   . Hypothyroidism   . Osteoporosis   . Allergy   . Hyperlipidemia   . RLS (restless legs syndrome)   . Pancreatic disease     pancreatic failure  . Urinary incontinence   . Pancreatitis     Pancreatic resection/ open wound- [facility] 11/04  . Pneumonia     Pneumonia/ Emphysema 05/05  . 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 TIA (transient ischemic attack)     recurrent 12/15  . History of pleural empyema   . Sepsis (HCC)   . Frequent falls   . Arthritis     left knee, [facility]  . CAD (coronary artery disease), native coronary artery      1//15  T mid-LAD  stent OM2  . Parkinson's disease [facility])      Patient Active Problem List   Diagnosis Date Noted  . Diarrhea 11/02/2015  . Ecchymosis 11/02/2015  . Protein-calorie malnutrition (HCC) 09/17/2015  . Failure to thrive in adult 09/14/2015  . C. difficile colitis 09/14/2015  . Drug eruption 09/14/2015  . Thrombocytopenia (HCC) 09/14/2015  . Hypothermia 09/14/2015  . Generalized weakness 09/14/2015  . Hypoglycemia 09/11/2015  . Right shoulder pain 06/01/2015  . Right arm pain 05/11/2015  . Insomnia 03/17/2015  . GIB (gastrointestinal bleeding) 03/12/2015  . Chronic fatigue 03/10/2015  . Paroxysmal atrial fibrillation (HCC) 03/02/2015  . CKD (chronic kidney disease), stage III 02/12/2015  . Frequent falls 02/12/2015  . Anxiety 02/12/2015  . H/O resection of pancreas 02/12/2015  . Dysphagia, pharyngoesophageal phase 01/23/2015  . Memory loss 10/23/2014  . CVA (cerebral infarction) 08/07/2014  . Left rotator cuff tear 08/07/2014  . Coronary artery disease 09/12/2013  . Dyspnea 06/20/2013  . Tremor 06/07/2012  . Osteoarthrosis, unspecified whether generalized or localized, involving lower leg 02/27/2012  . Open abdominal wall wound 04/12/2010  . Hyperlipidemia 10/27/2008  . Hypothyroidism 03/07/2007  . Diabetes type 2, controlled (HCC) 03/07/2007  . DEPRESSION 03/07/2007  . Essential hypertension 03/07/2007  . FAILURE, DIASTOLIC HEART, CHRONIC 03/07/2007  . GERD 03/07/2007    Past Surgical History  Procedure Laterality Date  . Breast  lumpectomy      Lumpectomy right breast  . 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 Abdominal hysterectomy      Hysteroscopy/ D & C ([place]) 12/05  . Removal of pancreas  2004  . Kyphoplasty  2005  . Broken shoulder and orbital bone  2005  . Cholecystectomy  1973  . Thoracentesis    . Vaginal delivery      7  . Cardiac catheterization  08/29/2013    x1 stent @ [facility]  . Cataract surgery      Current Outpatient Rx  Name  Route  Sig   Dispense  Refill  . albuterol (PROVENTIL HFA;VENTOLIN HFA) 108 (90 BASE) MCG/ACT inhaler   Inhalation   Inhale 2 puffs into the lungs every 6 (six) hours as needed for wheezing or shortness of breath.   18 g   3   . ALPRAZolam (XANAX) 1 MG tablet      TAKE 1/2 TABLET BY MOUTH IN THE MORNING,1/2 TABLET AT LUNCH AND 1 TABLET AT BEDTIME IF NEEDED   60 tablet   1   . amLODipine (NORVASC) 5 MG tablet   Oral   Take 1 tablet (5 mg total) by mouth daily.   30 tablet   5   . amoxicillin-clavulanate (AUGMENTIN) 500-125 MG tablet   Oral   Take 1 tablet (500 mg total) by mouth 2 (two) times daily.   14 tablet   0   . budesonide (ENTOCORT EC) 3 MG 24 hr capsule   Oral   Take 9 mg by mouth daily.          . busPIRone (BUSPAR) 10 MG tablet   Oral   Take 10 mg by mouth 2 (two) times daily.         . carbidopa-levodopa (SINEMET IR) 25-100 MG tablet   Oral   Take 2 tablets by mouth 2 (two) times daily.         . cetirizine (ZYRTEC) 10 MG tablet   Oral   Take 10 mg by mouth daily as needed for allergies.          . ciprofloxacin (CIPRO) 250 MG tablet   Oral   Take 250 mg by mouth 2 (two) times daily.         . clopidogrel (PLAVIX) 75 MG tablet      TAKE ONE (1) TABLET EACH DAY WITH BREAKFAST   30 tablet   0   . cyanocobalamin (,VITAMIN B-12,) 1000 MCG/ML injection   Intramuscular   Inject 1 mL (1,000 mcg total) into the muscle every 30 (thirty) days.   30 mL   3   . fluticasone (FLONASE) 50 MCG/ACT nasal spray   Each Nare   Place 2 sprays into both nostrils daily as needed for rhinitis.          . furosemide (LASIX) 40 MG tablet   Oral   Take 1 tablet (40 mg total) by mouth daily.   90 tablet   3   . HYDROcodone-acetaminophen (NORCO/VICODIN) 5-325 MG tablet   Oral   Take 1-2 tablets by mouth 3 (three) times daily as needed for moderate pain.   120 tablet   0   . insulin glargine (LANTUS) 100 UNIT/ML injection   Subcutaneous   Inject 0.45 mLs (45  Units total) into the skin at bedtime.   10 mL   11   . insulin lispro (HUMALOG) 100 UNIT/ML injection  Subcutaneous   Inject 0.08-0.18 mLs (8-18 Units total) into the skin 3 (three) times daily. Pt uses per sliding scale.   10 mL   11   . Insulin Pen Needle 32G X 6 MM MISC      Use as directed   100 each   11   . levothyroxine (SYNTHROID, LEVOTHROID) 75 MCG tablet   Oral   Take 75 mcg by mouth daily.         . metoprolol (TOPROL-XL) 200 MG 24 hr tablet   Oral   Take 200 mg by mouth daily.          . Multiple Vitamin (MULTIVITAMIN WITH MINERALS) TABS tablet   Oral   Take 1 tablet by mouth daily.         Marland Kitchen. omeprazole (PRILOSEC) 20 MG capsule   Oral   Take 1 capsule (20 mg total) by mouth daily.   90 capsule   3   . ondansetron (ZOFRAN) 4 MG tablet   Oral   Take 1 tablet (4 mg total) by mouth every 4 (four) hours as needed for nausea or vomiting. Patient not taking: Reported on 11/04/2015   40 tablet   3   . pravastatin (PRAVACHOL) 40 MG tablet   Oral   Take 40 mg by mouth daily.          Marland Kitchen. saccharomyces boulardii (FLORASTOR) 250 MG capsule   Oral   Take 250 mg by mouth daily. Reported on 11/04/2015         . tiZANidine (ZANAFLEX) 4 MG tablet   Oral   Take 1 tablet (4 mg total) by mouth 3 (three) times daily. Patient not taking: Reported on 11/04/2015   15 tablet   0   . ZENPEP 15000 units CPEP      TAKE 1 CAPSULE BY MOUTH THREE TIMES A DAY   100 capsule   1     Allergies Citalopram hydrobromide; Diphenhydramine; Metoclopramide; Paroxetine; and Tape  Family History  Problem Relation Age of Onset  . Heart disease Mother   . Heart attack Mother   . Hypertension Mother   . Heart attack Father     Social History Social History  Substance Use Topics  . Smoking status: Never Smoker   . Smokeless tobacco: Never Used  . Alcohol Use: No    Review of Systems Constitutional: Patient has run a low-grade fever off and on for the past couple  weeks. Eyes: No visual changes. ENT: No sore throat. Cardiovascular: Denies chest pain. Respiratory: Increasing shortness of breath and cough over the past several days. Gastrointestinal: No abdominal pain but had some bright red rectal bleeding this morning.  No nausea, no vomiting.  No diarrhea.  No constipation. Genitourinary: Negative for dysuria. But has been on antibiotics for 4 weeks for urinary tract infection. Musculoskeletal: Negative for back pain. Skin: Negative for rash. Neurological: Negative for headaches, focal weakness or numbness.  10-point ROS otherwise negative.  ____________________________________________   PHYSICAL EXAM:  VITAL SIGNS: ED Triage Vitals  Enc Vitals Group     BP 11/18/15 1136 101/61 mmHg     Pulse Rate 11/18/15 1136 68     Resp 11/18/15 1136 23     Temp 11/18/15 1136 98.4 F (36.9 C)     Temp Source 11/18/15 1136 Oral     SpO2 11/18/15 1136 96 %     Weight 11/18/15 1136 189 lb (85.73 kg)     Height 11/18/15 1136 4'  11" (1.499 m)     Head Cir --      Peak Flow --      Pain Score --      Pain Loc --      Pain Edu? --      Excl. in GC? --     Constitutional: Alert and oriented. Well appearing and in mild acute distress from her SOB. Eyes: Conjunctivae are normal. PERRL. EOMI. Head: Atraumatic. Nose: No congestion/rhinnorhea. Mouth/Throat: Mucous membranes are moist.  Oropharynx non-erythematous. Neck: No stridor.   Cardiovascular: Normal rate, regular rhythm. Grossly normal heart sounds.  Good peripheral circulation. Respiratory: Pt with mild tachypneat.  No retractions. Lungs with mild diffuse wheezing. Gastrointestinal: Soft and nontender. No distention. No abdominal bruits. No CVA tenderness.Pt with hemoccult positive, but stools are brown.   Genitourinary:  No vaginal bleeding noted per bimanual exam. Musculoskeletal: No lower extremity tenderness nor edema.  No joint effusions. Neurologic:  Normal speech and language. No gross  focal neurologic deficits are appreciated. No gait instability. Skin:  Skin is warm, dry and intact. No rash noted. Psychiatric: Mood and affect are normal. Speech and behavior are normal.  ____________________________________________   LABS (all labs ordered are listed, but only abnormal results are displayed)  Labs Reviewed  CBC WITH DIFFERENTIAL/PLATELET - Abnormal; Notable for the following:    Platelets 139 (*)    All other components within normal limits  COMPREHENSIVE METABOLIC PANEL - Abnormal; Notable for the following:    Potassium 3.2 (*)    Chloride 100 (*)    CO2 33 (*)    Glucose, Bld 187 (*)    Calcium 8.6 (*)    Total Protein 6.2 (*)    Albumin 2.7 (*)    ALT 13 (*)    GFR calc non Af Amer 59 (*)    All other components within normal limits  BRAIN NATRIURETIC PEPTIDE - Abnormal; Notable for the following:    B Natriuretic Peptide 148.0 (*)    All other components within normal limits  BLOOD GAS, ARTERIAL - Abnormal; Notable for the following:    pH, Arterial 7.46 (*)    pCO2 arterial 51 (*)    Bicarbonate 36.3 (*)    Acid-Base Excess 10.6 (*)    All other components within normal limits  TROPONIN I  PROTIME-INR   ____________________________________________  EKG  ED ECG REPORT I, Leona Carry, the attending physician, personally viewed and interpreted this ECG.   Date: 11/18/2015  EKG Time: 1236  Rate: 63  Rhythm: Normal sinus rhythm with prolonged PR interval  Axis: Normal  Intervals:Poor RW progression  ST&T Change: History of depression and flipped T waves inferiorly and laterally  ____________________________________________  RADIOLOGY Dg Chest 2 View  11/18/2015  CLINICAL DATA:  82 year old female with 1 week of shortness of breath. Initial encounter. EXAM: CHEST  2 VIEW COMPARISON:  09/11/2015 and earlier. FINDINGS: Seated AP and lateral views of the chest. Chronic cardiac event recorder re- demonstrated. Lower lung volumes. Stable  cardiomegaly and mediastinal contours. Stable pulmonary vascular congestion without overt edema. No pleural effusion, pneumothorax or consolidation identified. Osteopenia. Widespread spinal compression fractures, several of which are chronically augmented. Stable visualized surgical clips in the upper abdomen. Calcified aortic atherosclerosis. IMPRESSION: Low lung volumes. Stable cardiomegaly and vascular congestion without overt edema. No new cardiopulmonary abnormality identified. Electronically Signed   By: Odessa Fleming M.D.   On: 11/18/2015 13:28    ____________________________________________   PROCEDURES  Procedure(s) performed: None  Critical Care performed: No  ____________________________________________   INITIAL IMPRESSION / ASSESSMENT AND PLAN / ED COURSE  Pertinent labs & imaging results that were available during my care of the patient were reviewed by me and considered in my medical decision making (see chart for details). 1:56 PM Pt to get routine labs, xrays, and ABG.  Pt started on IV fluids.  Pt requested small dose of benadryl for reaction to the bed sheets.  1:56 PM Pt given po potassium for her hypokalemia.  1:56 PM Patient will be admitted to the hospitalist for further treatment and evaluation. ____________________________________________   FINAL CLINICAL IMPRESSION(S) / ED DIAGNOSES  Final diagnoses:  Acute GI bleeding  Chronic obstructive pulmonary disease, unspecified COPD type (HCC)  Congestive heart failure, unspecified congestive heart failure chronicity, unspecified congestive heart failure type (HCC)  Hypokalemia      Leona Carry, MD 11/18/15 1356

## 2015-11-18 NOTE — Evaluation (Signed)
Physical Therapy Evaluation Patient Details Name: [name] SpringClara O [name] MRN: [id number] DOB: 07/18/1929 Today's Date: 11/18/2015   History of Present Illness  [name] is a [age] y.o. female with a known history of Chronic diastolic CHF, diabetes, GERD, hypertension, hypothyroidism, osteoporosis, restless leg syndrome, urinary incontinence, anxiety, history of osteoarthritis who presents to the hospital due to one episode of rectal bleeding and also having some cough and shortness of breath. Patient said she had one episode of rectal bleeding which she quantifies to maybe half a Cup. She denies any abdominal pain, fever, chills, nausea, vomiting associated with this. She also says that she's had a cough, wheezing and shortness of breath ongoing for the past few days. She also admits to a low-grade fever but she cannot remember ever checking it. Patient denies any sick contacts or any other associated symptoms. Patient presented to the emergency room and was noted to be in COPD exacerbation and hospitalist services were contacted further treatment and evaluation. Pt reports 5-6 falls over the last 12 months. Most recent fall was "3-4 weeks ago." Pt reports chornic L shoulder pain for which she has been receiving HH PT. Pt reports last therapy session was approximately 2 weeks ago.   Clinical Impression  Pt demonstrates baseline function with bed mobility, transfers, and ambulation with the exception of increased DOE during ambulation. Generalized weakness and baseline balance deficits resulting in frequent falls at baseline. Pt provided multiple standing rest breaks during ambulation  due to fatigue and SOB. She requires supplemental O2 for ambulation at this time however able to maintain SaO2>90% on 2L/min. Pt will benefit from skilled PT services to address deficits in strength, balance, and mobility in order to return to full function at home. Pt may not need HH PT depending on how close she is able to return to  her baseline cardiopulmonary status prior to discharge.     Follow Up Recommendations Home health PT    Equipment Recommendations  None recommended by PT    Recommendations for Other Services       Precautions / Restrictions Precautions Precautions: Fall Restrictions Weight Bearing Restrictions: No      Mobility  Bed Mobility Overal bed mobility: Needs Assistance Bed Mobility: Supine to Sit     Supine to sit: Min assist     General bed mobility comments: Pt requires assist to go from R sidelying to sitting. Pt has bed rail at home and struggles somewhat with bed mobility at baseline. Pt reports that hospital bed is harder to exit than her bed at home  Transfers Overall transfer level: Needs assistance Equipment used: Rolling walker (2 wheeled) Transfers: Sit to/from Stand Sit to Stand: Min guard         General transfer comment: Pt with good stability and safety during transfer. Safe hand placement and good stability once upright in standing  Ambulation/Gait Ambulation/Gait assistance: Min guard Ambulation Distance (Feet): 150 Feet Assistive device: Rolling walker (2 wheeled) Gait Pattern/deviations: Decreased step length - right;Decreased step length - left Gait velocity: Decreased but functional for full household mobility Gait velocity interpretation: <1.8 ft/sec, indicative of risk for recurrent falls General Gait Details: Pt ambulates with rolling walker requiring cues to keep center of mass within borders of walker. 2 L/min supplemental O2 donned throughout ambulation. Pt reports DOE but is unable to rate on BORG. Fatigue and vitals monitored and pt provided multiple standing rest breaks with cues for pursed lip breathing. SaO2 remains >90% on 2 L/min O2 throughout ambulation.  HR remains <100 bpm  Stairs            Wheelchair Mobility    Modified Rankin (Stroke Patients Only)       Balance Overall balance assessment: Needs  assistance Sitting-balance support: No upper extremity supported Sitting balance-Leahy Scale: Good     Standing balance support: No upper extremity supported Standing balance-Leahy Scale: Fair Standing balance comment: Pt able to maintain wide stance without UE support. Loses balance within 5 seconds when eyes are closed. Unable to obtain narrow stance without UE support. Once assisted into narrow stance pt unable to maintain                             Pertinent Vitals/Pain Pain Assessment: No/denies pain    Home Living Family/patient expects to be discharged to:: Private residence Living Arrangements: Alone Available Help at Discharge: Family Type of Home: House Home Access: Ramped entrance     Home Layout: Two level;Able to live on main level with bedroom/bathroom Home Equipment: Walker - 4 wheels;Shower seat;Bedside commode;Grab bars - tub/shower;Grab bars - toilet;Wheelchair - manual (Lift chair, no hsoptial bed)      Prior Function Level of Independence: Needs assistance      ADL's / Homemaking Assistance Needed: Patient has an aide who comes for approximatley 7 hours a day, 7 days a week to assist with cleaning and self care tasks. Patient requires assist for bathing and dressing as well as homecare and meal preparation  Comments: Mod indep with household mobility using RW;     Hand Dominance   Dominant Hand: Right    Extremity/Trunk Assessment   Upper Extremity Assessment: Generalized weakness           Lower Extremity Assessment: Generalized weakness;RLE deficits/detail RLE Deficits / Details: Bilateral shoulder flexion deficits which are chronic prior to admission. Generalized deconditioning and weakness throughout UE/LE       Communication   Communication: No difficulties  Cognition Arousal/Alertness: Awake/alert Behavior During Therapy: WFL for tasks assessed/performed Overall Cognitive Status: Within Functional Limits for tasks  assessed                      General Comments      Exercises        Assessment/Plan    PT Assessment Patient needs continued PT services  PT Diagnosis Difficulty walking;Abnormality of gait;Generalized weakness   PT Problem List Decreased strength;Decreased activity tolerance;Decreased balance;Decreased safety awareness;Cardiopulmonary status limiting activity  PT Treatment Interventions DME instruction;Gait training;Therapeutic activities;Therapeutic exercise;Balance training;Neuromuscular re-education;Patient/family education   PT Goals (Current goals can be found in the Care Plan section) Acute Rehab PT Goals Patient Stated Goal: To be able to ambulate without shortness of breath PT Goal Formulation: With patient/family Time For Goal Achievement: 12/02/15 Potential to Achieve Goals: Good    Frequency Min 2X/week   Barriers to discharge Decreased caregiver support Lives alone but family close by to assist as needed. Pt has HH aid 7 hr/day, 7 days/week    Co-evaluation               End of Session Equipment Utilized During Treatment: Gait belt;Oxygen Activity Tolerance: Treatment limited secondary to medical complications (Comment);Other (comment) (Limited by DOE) Patient left: in bed;with call bell/phone within reach;with bed alarm set;with family/visitor present;Other (comment) (CNA brought water for patient)           Time: 1610-9604 PT Time Calculation (min) (ACUTE ONLY): 33  min   Charges:   PT Evaluation $PT Eval Moderate Complexity: 1 Procedure PT Treatments $Gait Training: 8-22 mins   PT G Codes:       [name] Kimi Bordeau PT, DPT   Carma Dwiggins 11/18/2015, 5:10 PM

## 2015-11-18 NOTE — Telephone Encounter (Signed)
Can you confirm with her that [facility] is [facility]?

## 2015-11-18 NOTE — ED Notes (Signed)
Patient transported to X-ray 

## 2015-11-18 NOTE — ED Notes (Addendum)
Pt in via EMS from home.  Pt with home health RN who called out due to seeing bloody stool in pt depends this am.  No report as to amount of blood noted.  Pt does report taking plavix.  Pt only complaint is a cough and wheezing x "about 2 weeks".  Pt on 2L O2 at night, but has been having to wear oxygen during the day since developing the cough and wheezing.  Pt reports using inhaler for wheezing at home.  Pt A/Ox4, vitals WDL, no immediate distress at this time.

## 2015-11-18 NOTE — H&P (Signed)
[facility] [place] at [facility]   PATIENT NAME: [name]    MR#:  [id number]  DATE OF BIRTH:  09-07-1928  DATE OF ADMISSION:  11/18/2015  PRIMARY CARE PHYSICIAN: Wynona Dove, MD   REQUESTING/REFERRING PHYSICIAN: Dr. Ladona Ridgel  CHIEF COMPLAINT:   Chief Complaint  Patient presents with  . Rectal Bleeding  Cough, shortness of breath.  HISTORY OF PRESENT ILLNESS:  [name]  is a 82 y.o. female with a known history of Chronic diastolic CHF, diabetes, GERD, hypertension, hypothyroidism, osteoporosis, restless leg syndrome, urinary incontinence, anxiety, history of osteoarthritis who presents to the hospital due to one episode of rectal bleeding and also having some cough and shortness of breath. Patient said she had one episode of rectal bleeding which she quantifies to maybe half a Cup. She denies any abdominal pain, fever, chills, nausea, vomiting associated with this. She also says that she's had a cough, wheezing and shortness of breath ongoing for the past few days. She also admits to a low-grade fever but she cannot remember ever checking it. Patient denies any sick contacts or any other associated symptoms. Patient presented to the emergency room and was noted to be in COPD exacerbation and hospitalist services were contacted further treatment and evaluation.  PAST MEDICAL HISTORY:   Past Medical History  Diagnosis Date  . Anxiety   . Chronic diastolic CHF (congestive heart failure) (HCC)   . Depression   . Diabetes mellitus     Followed by Dr. Tedd Sias at [facility]  . GERD (gastroesophageal reflux disease)   . Hypertension   . Hypothyroidism   . Osteoporosis   . Allergy   . Hyperlipidemia   . RLS (restless legs syndrome)   . Pancreatic disease     pancreatic failure  . Urinary incontinence   . Pancreatitis     Pancreatic resection/ open wound- [facility] 11/04  . Pneumonia     Pneumonia/ Emphysema 05/05  . 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 TIA (transient ischemic attack)     recurrent 12/15  . History of pleural empyema   . Sepsis (HCC)   . Frequent falls   . Arthritis     left knee, [facility]  . CAD (coronary artery disease), native coronary artery      1//15  T mid-LAD  stent OM2  . Parkinson's disease (HCC)     PAST SURGICAL HISTORY:   Past Surgical History  Procedure Laterality Date  . Breast lumpectomy      Lumpectomy right breast  . 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 Abdominal hysterectomy      Hysteroscopy/ D & C ([place]) 12/05  . Removal of pancreas  2004  . Kyphoplasty  2005  . Broken shoulder and orbital bone  2005  . Cholecystectomy  1973  . Thoracentesis    . Vaginal delivery      7  . Cardiac catheterization  08/29/2013    x1 stent @ [facility]  . Cataract surgery      SOCIAL HISTORY:   Social History  Substance Use Topics  . Smoking status: Never Smoker   . Smokeless tobacco: Never Used  . Alcohol Use: No    FAMILY HISTORY:   Family History  Problem Relation Age of Onset  . Heart disease Mother   . Heart attack Mother   . Hypertension Mother   .  Heart attack Father     DRUG ALLERGIES:   Allergies  Allergen Reactions  . Citalopram Hydrobromide Nausea And Vomiting and Other (See Comments)    Reaction:  Dizziness and dry mouth   . Diphenhydramine Other (See Comments)    Reaction:  Unknown   . Metoclopramide Other (See Comments)    Reaction:  Unknown   . Paroxetine Other (See Comments)    Reaction:  Hallucinations   . Tape Rash    REVIEW OF SYSTEMS:   Review of Systems  Constitutional: Negative for fever and chills.  HENT: Negative for congestion and tinnitus.   Eyes: Negative for blurred vision and double vision.  Respiratory: Positive for cough, shortness of breath and wheezing.   Cardiovascular: Negative for chest pain, orthopnea and PND.  Gastrointestinal: Positive for  blood in stool. Negative for nausea, vomiting, abdominal pain and diarrhea.  Genitourinary: Negative for dysuria and hematuria.  Neurological: Negative for dizziness, sensory change and focal weakness.  All other systems reviewed and are negative.   MEDICATIONS AT HOME:   Prior to Admission medications   Medication Sig Start Date End Date Taking? Authorizing Provider  albuterol (PROVENTIL HFA;VENTOLIN HFA) 108 (90 BASE) MCG/ACT inhaler Inhale 2 puffs into the lungs every 6 (six) hours as needed for wheezing or shortness of breath. 08/03/15  Yes Shelia MediaJennifer A Walker, MD  ALPRAZolam Prudy Feeler(XANAX) 1 MG tablet Take 0.5-1 mg by mouth 3 (three) times daily as needed for anxiety. Take 0.5mg  in morning, take 0.5mg  at lunch, and take 1mg  at bedtime if needed   Yes Historical Provider, MD  amLODipine (NORVASC) 5 MG tablet Take 1 tablet (5 mg total) by mouth daily. 07/07/15  Yes [place] OuchMuhammad A Arida, MD  budesonide (ENTOCORT EC) 3 MG 24 hr capsule Take 9 mg by mouth daily.    Yes Historical Provider, MD  busPIRone (BUSPAR) 10 MG tablet Take 10 mg by mouth 2 (two) times daily.   Yes Historical Provider, MD  carbidopa-levodopa (SINEMET IR) 25-100 MG tablet Take 1 tablet by mouth 3 (three) times daily.   Yes Historical Provider, MD  cetirizine (ZYRTEC) 10 MG tablet Take 10 mg by mouth daily as needed for allergies.    Yes Historical Provider, MD  clopidogrel (PLAVIX) 75 MG tablet Take 75 mg by mouth daily.   Yes Historical Provider, MD  cyanocobalamin (,VITAMIN B-12,) 1000 MCG/ML injection Inject 1 mL (1,000 mcg total) into the muscle every 30 (thirty) days. 06/01/15  Yes Shelia MediaJennifer A Walker, MD  furosemide (LASIX) 40 MG tablet Take 1 tablet (40 mg total) by mouth daily. 12/02/14  Yes [place] OuchMuhammad A Arida, MD  HYDROcodone-acetaminophen (NORCO/VICODIN) 5-325 MG tablet Take 1-2 tablets by mouth 3 (three) times daily as needed for moderate pain. 11/02/15  Yes Shelia MediaJennifer A Walker, MD  insulin glargine (LANTUS) 100 unit/mL SOPN Inject  70 Units into the skin at bedtime.   Yes Historical Provider, MD  insulin lispro (HUMALOG) 100 UNIT/ML injection Inject 0.08-0.18 mLs (8-18 Units total) into the skin 3 (three) times daily. Pt uses per sliding scale. 10/19/15  Yes Shelia MediaJennifer A Walker, MD  levothyroxine (SYNTHROID, LEVOTHROID) 75 MCG tablet Take 75 mcg by mouth daily.   Yes Historical Provider, MD  Multiple Vitamin (MULTIVITAMIN WITH MINERALS) TABS tablet Take 1 tablet by mouth daily.   Yes Historical Provider, MD  omeprazole (PRILOSEC) 20 MG capsule Take 1 capsule (20 mg total) by mouth daily. 06/01/15  Yes Shelia MediaJennifer A Walker, MD  Pancrelipase, Lip-Prot-Amyl, (ZENPEP) 15000 units CPEP Take 1  capsule by mouth 3 (three) times daily.   Yes Historical Provider, MD  pravastatin (PRAVACHOL) 40 MG tablet Take 40 mg by mouth daily.    Yes Historical Provider, MD  saccharomyces boulardii (FLORASTOR) 250 MG capsule Take 250 mg by mouth daily. Reported on 11/04/2015   Yes Historical Provider, MD      VITAL SIGNS:  Blood pressure 126/77, pulse 66, temperature 98.4 F (36.9 C), temperature source Oral, resp. rate 24, height  (1.499 m), weight 85.73 kg (189 lb), SpO2 98 %.  PHYSICAL EXAMINATION:  Physical Exam  GENERAL:  82 y.o.-year-old patient lying in the bed In mild respiratory distress  EYES: Pupils equal, round, reactive to light and accommodation. No scleral icterus. Extraocular muscles intact.  HEENT: Head atraumatic, normocephalic. Oropharynx and nasopharynx clear. No oropharyngeal erythema, moist oral mucosa  NECK:  Supple, no jugular venous distention. No thyroid enlargement, no tenderness.  LUNGS: Diffuse wheezing, rhonchi bilaterally. Good air entry bilaterally. No evidence of accessory muscle use. CARDIOVASCULAR: S1, S2 RRR. No murmurs, rubs, gallops, clicks.  ABDOMEN: Soft, nontender, nondistended. Bowel sounds present. No organomegaly or mass.  EXTREMITIES: +1-2 pitting edema bilaterally, no cyanosis, or clubbing. + 2  pedal & radial pulses b/l.   NEUROLOGIC: Cranial nerves II through XII are intact. No focal Motor or sensory deficits appreciated b/l.  Globally weak PSYCHIATRIC: The patient is alert and oriented x 3. Good affect.  SKIN: No obvious rash, lesion, or ulcer.   LABORATORY PANEL:   CBC  Recent Labs Lab 11/18/15 1138  WBC 7.1  HGB 14.1  HCT 41.7  PLT 139*   ------------------------------------------------------------------------------------------------------------------  Chemistries   Recent Labs Lab 11/18/15 1138  NA 142  K 3.2*  CL 100*  CO2 33*  GLUCOSE 187*  BUN 19  CREATININE 0.88  CALCIUM 8.6*  AST 31  ALT 13*  ALKPHOS 53  BILITOT 0.6   ------------------------------------------------------------------------------------------------------------------  Cardiac Enzymes  Recent Labs Lab 11/18/15 1138  TROPONINI 0.03   ------------------------------------------------------------------------------------------------------------------  RADIOLOGY:  Dg Chest 2 View  11/18/2015  CLINICAL DATA:  82 year old female with 1 week of shortness of breath. Initial encounter. EXAM: CHEST  2 VIEW COMPARISON:  09/11/2015 and earlier. FINDINGS: Seated AP and lateral views of the chest. Chronic cardiac event recorder re- demonstrated. Lower lung volumes. Stable cardiomegaly and mediastinal contours. Stable pulmonary vascular congestion without overt edema. No pleural effusion, pneumothorax or consolidation identified. Osteopenia. Widespread spinal compression fractures, several of which are chronically augmented. Stable visualized surgical clips in the upper abdomen. Calcified aortic atherosclerosis. IMPRESSION: Low lung volumes. Stable cardiomegaly and vascular congestion without overt edema. No new cardiopulmonary abnormality identified. Electronically Signed   By: Odessa Fleming M.D.   On: 11/18/2015 13:28     IMPRESSION AND PLAN:   82 year old female with past medical history of  diabetes, hypertension, hyperlipidemia, anxiety, history of pancreatitis with pancreatectomy, COPD, depression, restless leg syndrome, Parkinson's disease, coronary artery disease, who presents to the hospital with an episode of rectal bleeding also noted to have cough and wheezing.  #1 COPD exacerbation-I suspect this is probably secondary to acute bronchitis. -Start patient on IV steroids, DuoNeb nebs around-the-clock, Pulmicort nebs. Empirically start the patient on Z-Pak. Assessment home oxygen prior to discharge.  #2 rectal bleeding-patient only had one episode of this. Her hemoglobin is currently stable. -I will hold her Plavix. Follow hemoglobin for now. If she shows no evidence of acute bleeding I would resume the Plavix and x-ray 4 hours.  #3 diabetes type 2 without  complication-continue sliding scale insulin. Carb-controlled diet. -Follow blood sugars.  #4 history of Parkinson's disease-continue Sinemet.  #5 history of diastolic CHF-patient clinically is not in congestive heart failure. Continue home dose Lasix.  #6 hyperlipidemia-continue Pravachol.  #7 GERD-continue Protonix.  #8 history of pancreatitis with partial pancreatectomy-continue pancreatic lipase supplements.  #9 hypothyroidism-continue Synthroid.    All the records are reviewed and case discussed with ED provider. Management plans discussed with the patient, family and they are in agreement.  CODE STATUS: Full code  TOTAL TIME TAKING CARE OF THIS PATIENT: 45 minutes.    Houston Siren M.D on 11/18/2015 at 3:04 PM  Between 7am to 6pm - Pager - [phone]  After 6pm go to [email or web address] - password [email or web address] [facility]  [facility]  Office  [phone]  CC: Primary care physician; Wynona Dove, MD

## 2015-11-18 NOTE — Telephone Encounter (Signed)
Pt being seen in the ED now, just spoke with daughter

## 2015-11-18 NOTE — Telephone Encounter (Signed)
FYI

## 2015-11-19 LAB — CBC
HCT: 40.6 % (ref 35.0–47.0)
Hemoglobin: 13.7 g/dL (ref 12.0–16.0)
MCH: 31.6 pg (ref 26.0–34.0)
MCHC: 33.8 g/dL (ref 32.0–36.0)
MCV: 93.5 fL (ref 80.0–100.0)
PLATELETS: 105 10*3/uL — AB (ref 150–440)
RBC: 4.34 MIL/uL (ref 3.80–5.20)
RDW: 14.2 % (ref 11.5–14.5)
WBC: 5.9 10*3/uL (ref 3.6–11.0)

## 2015-11-19 LAB — BASIC METABOLIC PANEL
ANION GAP: 9 (ref 5–15)
BUN: 18 mg/dL (ref 6–20)
CALCIUM: 8.9 mg/dL (ref 8.9–10.3)
CO2: 30 mmol/L (ref 22–32)
CREATININE: 0.85 mg/dL (ref 0.44–1.00)
Chloride: 102 mmol/L (ref 101–111)
GLUCOSE: 258 mg/dL — AB (ref 65–99)
Potassium: 3.9 mmol/L (ref 3.5–5.1)
Sodium: 141 mmol/L (ref 135–145)

## 2015-11-19 LAB — GLUCOSE, CAPILLARY
GLUCOSE-CAPILLARY: 230 mg/dL — AB (ref 65–99)
GLUCOSE-CAPILLARY: 252 mg/dL — AB (ref 65–99)
GLUCOSE-CAPILLARY: 289 mg/dL — AB (ref 65–99)
Glucose-Capillary: 350 mg/dL — ABNORMAL HIGH (ref 65–99)
Glucose-Capillary: 292 mg/dL — ABNORMAL HIGH (ref 65–99)

## 2015-11-19 LAB — HEMOGLOBIN: HEMOGLOBIN: 13.3 g/dL (ref 12.0–16.0)

## 2015-11-19 NOTE — Therapy (Signed)
Physical Therapy Treatment Patient Details Name: [name] MRN: [id number] DOB: 08/13/1929 Today's Date: 11/19/2015    History of Present Illness [name] is a 82 y.o. female with a known history of Chronic diastolic CHF, diabetes, GERD, hypertension, hypothyroidism, osteoporosis, restless leg syndrome, urinary incontinence, anxiety, history of osteoarthritis who presents to the hospital due to one episode of rectal bleeding and also having some cough and shortness of breath. Patient said she had one episode of rectal bleeding which she quantifies to maybe half a Cup. She denies any abdominal pain, fever, chills, nausea, vomiting associated with this. She also says that she's had a cough, wheezing and shortness of breath ongoing for the past few days. She also admits to a low-grade fever but she cannot remember ever checking it. Patient denies any sick contacts or any other associated symptoms. Patient presented to the emergency room and was noted to be in COPD exacerbation and hospitalist services were contacted further treatment and evaluation. Pt reports 5-6 falls over the last 12 months. Most recent fall was "3-4 weeks ago." Pt reports chornic L shoulder pain for which she has been receiving HH PT. Pt reports last therapy session was approximately 2 weeks ago.     PT Comments    Pt to bathroom with bright bloody BM. Ambulate to/from toilet with rolling walker and cga x 1.  Assist needed for personal care.  Ambulated 50' x 1 limited by fatigue today.  Required cga x 1 for safety.  Pt unable to ambulate as far as prior treatment "I am tired today".  Assisted back to bed per her request.  Home Health Aide in with patient.  Primary nurse Thayer Ohmhris notified or blood in toilet and additional blood in depends after ambulation.    Follow Up Recommendations        Equipment Recommendations       Recommendations for Other Services       Precautions / Restrictions Precautions Precautions:  Fall Restrictions Weight Bearing Restrictions: No    Mobility  Bed Mobility Overal bed mobility: Modified Independent (needs rails and encouragment) Bed Mobility: Supine to Sit;Sit to Supine     Supine to sit: Modified independent (Device/Increase time) Sit to supine: Min assist      Transfers Overall transfer level: Modified independent Equipment used: Rolling walker (2 wheeled)                Ambulation/Gait Ambulation/Gait assistance: Min guard Ambulation Distance (Feet): 50 Feet Assistive device: Rolling walker (2 wheeled) Gait Pattern/deviations: Step-through pattern Gait velocity: Decreased but functional for full household mobility Gait velocity interpretation: Below normal speed for age/gender     Stairs            Wheelchair Mobility    Modified Rankin (Stroke Patients Only)       Balance                                    Cognition Arousal/Alertness: Awake/alert Behavior During Therapy: WFL for tasks assessed/performed Overall Cognitive Status: Within Functional Limits for tasks assessed                      Exercises      General Comments        Pertinent Vitals/Pain Pain Assessment: No/denies pain    Home Living  Prior Function            PT Goals (current goals can now be found in the care plan section) Acute Rehab PT Goals Patient Stated Goal: To be able to ambulate without shortness of breath    Frequency       PT Plan      Co-evaluation             End of Session           Time: 4098-1191 PT Time Calculation (min) (ACUTE ONLY): 19 min  Charges:  $Gait Training: 8-22 mins                    G Codes:      Danielle Dess, PTA 11/19/2015, 10:54 AM

## 2015-11-19 NOTE — Progress Notes (Signed)
Inpatient Diabetes Program Recommendations  AACE/ADA: New Consensus Statement on Inpatient Glycemic Control (2015)  Target Ranges:  Prepandial:   less than 140 mg/dL      Peak postprandial:   less than 180 mg/dL (1-2 hours)      Critically ill patients:  140 - 180 mg/dL   Review of Glycemic Control  Results for [name], Ebonye O (MRN [id number]) as of 11/19/2015 08:59  Ref. Range 11/18/2015 17:45 11/18/2015 21:41 11/19/2015 07:39 11/19/2015 08:42  Glucose-Capillary Latest Ref Range: 65-99 mg/dL 213196 (H) 086262 (H) 578230 (H) 252 (H)    Diabetes history: Type 2 Outpatient Diabetes medications: Lantus 70 units qhs, Humalog 8-18 units tid Current orders for Inpatient glycemic control: Lantus 70 units qhs, Novolog correction 0-15 units tid, Novolog 0-5 units qhs  Inpatient Diabetes Program Recommendations:  Please consider starting Novolog mealtime insulin 8 units tid with meals and continue Novolog correction as ordered.    Susette RacerJulie Yamaira Spinner, RN, BA, MHA, CDE Diabetes Coordinator Inpatient Diabetes Program  [phone] (Team Pager) [phone] [facility] Office) 11/19/2015 9:01 AM

## 2015-11-19 NOTE — Consult Note (Signed)
GI Inpatient Consult Note  Reason for Consult: Rectal bleeding   Attending Requesting Consult: Dr. Elpidio Anis  History of Present Illness: [name] is a 82 y.o. female with multiple chronic comorbidities including CHF, h/o TIA on Plavix, CAD, Parkinson disease, DM II, and s/p pancreatectomy admitted with a COPD exacerbation.  Patient states she began feeling progressively SOB with exertion over the last several weeks.  This gradually worsened, becoming severe with wheezing over the last few days.  She also noted a subjective low-grade fever and chills.  Yesterday morning she felt abdominal cramping and had a BM, passing a moderate volume of BRB into the commode water.  The stool was also loose, which is usual per patient.  Given these symptoms, she presented for ED evaluation.  VSS. Afebrile on arrival. Brown, heme + stool on exam. Labs: Hgb stable. Plts 139. K 3.2. Review of imaging in Care Everywhere shows patient underwent a CT a/p in 1/17.  This did not demonstrate any GI abnormalities.  Patient was admitted for further management of COPD exacerbation.  She had another episode of BRB with a loose BM this morning.  Hgb remains stable.    Plavix was d/c given bleeding.  She reports mild cramping prior to the BM, otherwise no abdominal pain.  No unexplained weight loss, appetite changes or nausea/vomiting.  She also denies dizziness, lightheadedness, CP, or fatigue.  SOB and wheezing is improved since receiving nebs and IV steroids.  Patient recalls having a colonoscopy about 20 years ago and believes it was normal.  No FHx of CCA.      Past Medical History:  Past Medical History  Diagnosis Date  . Anxiety   . Chronic diastolic CHF (congestive heart failure) (HCC)   . Depression   . Diabetes mellitus     Follow[facility]. Tedd Sias at [facility]  . GERD (gastroesophageal reflux disease)   . Hypertension   . Hypothyroidism   . Osteoporosis   . Allergy   . Hyperlipidemia   . RLS (restless legs  syndrome)   . Pancreatic disease     pancreatic failure  . Urinary incontinence   . Pancreatitis     Pancreatic resection/ open wound- [facility] 11/04  . Pneumonia     Pneumonia/ Emphysema 05/05  . 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 TIA (transient ischemic attack)     recurrent 12/15  . History of pleural empyema   . Sepsis (HCC)   . Frequent falls   . Arthritis     left knee, [facility]  . CAD (coronary artery disease), native coronary artery      1//15  T mid-LAD  stent OM2  . Parkinson's disease [facility])     Problem List: Patient Active Problem List   Diagnosis Date Noted  . COPD exacerbation (HCC) 11/18/2015  . Diarrhea 11/02/2015  . Ecchymosis 11/02/2015  . Protein-calorie malnutrition (HCC) 09/17/2015  . Failure to thrive in adult 09/14/2015  . C. difficile colitis 09/14/2015  . Drug eruption 09/14/2015  . Thrombocytopenia (HCC) 09/14/2015  . Hypothermia 09/14/2015  . Generalized weakness 09/14/2015  . Hypoglycemia 09/11/2015  . Right shoulder pain 06/01/2015  . Right arm pain 05/11/2015  . Insomnia 03/17/2015  . GIB (gastrointestinal bleeding) 03/12/2015  . Chronic fatigue 03/10/2015  . Paroxysmal atrial fibrillation (HCC) 03/02/2015  . CKD (chronic kidney disease), stage III 02/12/2015  . Frequent falls 02/12/2015  . Anxiety 02/12/2015  .  H/O resection of pancreas 02/12/2015  . Dysphagia, pharyngoesophageal phase 01/23/2015  . Memory loss 10/23/2014  . CVA (cerebral infarction) 08/07/2014  . Left rotator cuff tear 08/07/2014  . Coronary artery disease 09/12/2013  . Dyspnea 06/20/2013  . Tremor 06/07/2012  . Osteoarthrosis, unspecified whether generalized or localized, involving lower leg 02/27/2012  . Open abdominal wall wound 04/12/2010  . Hyperlipidemia 10/27/2008  . Hypothyroidism 03/07/2007  . Diabetes type 2, controlled (HCC) 03/07/2007  . DEPRESSION 03/07/2007  . Essential hypertension 03/07/2007  .  FAILURE, DIASTOLIC HEART, CHRONIC 03/07/2007  . GERD 03/07/2007    Past Surgical History: Past Surgical History  Procedure Laterality Date  . Breast lumpectomy      Lumpectomy right breast  . 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 Abdominal hysterectomy      Hysteroscopy/ D & C ([place]) 12/05  . Removal of pancreas  2004  . Kyphoplasty  2005  . Broken shoulder and orbital bone  2005  . Cholecystectomy  1973  . Thoracentesis    . Vaginal delivery      7  . Cardiac catheterization  08/29/2013    x1 stent @ [facility]  . Cataract surgery      Allergies: Allergies  Allergen Reactions  . Citalopram Hydrobromide Nausea And Vomiting and Other (See Comments)    Reaction:  Dizziness and dry mouth   . Diphenhydramine Other (See Comments)    Reaction:  Unknown   . Metoclopramide Other (See Comments)    Reaction:  Unknown   . Paroxetine Other (See Comments)    Reaction:  Hallucinations   . Tape Rash    Home Medications: Prescriptions prior to admission  Medication Sig Dispense Refill Last Dose  . albuterol (PROVENTIL HFA;VENTOLIN HFA) 108 (90 BASE) MCG/ACT inhaler Inhale 2 puffs into the lungs every 6 (six) hours as needed for wheezing or shortness of breath. 18 g 3 prn at prn  . ALPRAZolam (XANAX) 1 MG tablet Take 0.5-1 mg by mouth 3 (three) times daily as needed for anxiety. Take 0.5mg  in morning, take 0.5mg  at lunch, and take  at bedtime if needed   prn at prn  . amLODipine (NORVASC) 5 MG tablet Take 1 tablet (5 mg total) by mouth daily. 30 tablet 5 11/18/2015 at 0800  . [EXPIRED] amoxicillin-clavulanate (AUGMENTIN) 500-125 MG tablet Take 1 tablet by mouth 2 (two) times daily.   unknown at unknown  . budesonide (ENTOCORT EC) 3 MG 24 hr capsule Take 9 mg by mouth daily.    11/18/2015 at 0800  . busPIRone (BUSPAR) 10 MG tablet Take 10 mg by mouth 2 (two) times daily.   11/18/2015 at 0800  . carbidopa-levodopa (SINEMET IR) 25-100 MG tablet Take 1  tablet by mouth 3 (three) times daily.   11/18/2015 at 0800  . cetirizine (ZYRTEC) 10 MG tablet Take 10 mg by mouth daily as needed for allergies.    prn at prn  . clopidogrel (PLAVIX) 75 MG tablet Take 75 mg by mouth daily.   11/18/2015 at 0800  . cyanocobalamin (,VITAMIN B-12,) 1000 MCG/ML injection Inject 1 mL (1,000 mcg total) into the muscle every 30 (thirty) days. 30 mL 3 Past Month at Unknown time  . furosemide (LASIX) 40 MG tablet Take 1 tablet (40 mg total) by mouth daily. 90 tablet 3 11/18/2015 at 0800  . HYDROcodone-acetaminophen (NORCO/VICODIN) 5-325 MG tablet Take 1-2 tablets by mouth 3 (three) times daily as needed for moderate pain. 120  tablet 0 prn at prn  . insulin glargine (LANTUS) 100 unit/mL SOPN Inject 70 Units into the skin at bedtime.   11/17/2015 at Unknown time  . insulin lispro (HUMALOG) 100 UNIT/ML injection Inject 0.08-0.18 mLs (8-18 Units total) into the skin 3 (three) times daily. Pt uses per sliding scale. 10 mL 11 11/18/2015 at Unknown time  . levothyroxine (SYNTHROID, LEVOTHROID) 75 MCG tablet Take 75 mcg by mouth daily.   11/18/2015 at Unknown time  . Multiple Vitamin (MULTIVITAMIN WITH MINERALS) TABS tablet Take 1 tablet by mouth daily.   11/18/2015 at 0800  . omeprazole (PRILOSEC) 20 MG capsule Take 1 capsule (20 mg total) by mouth daily. 90 capsule 3 11/18/2015 at 0800  . Pancrelipase, Lip-Prot-Amyl, (ZENPEP) 15000 units CPEP Take 1 capsule by mouth 3 (three) times daily.   11/18/2015 at 0800  . pravastatin (PRAVACHOL) 40 MG tablet Take 40 mg by mouth daily.    11/18/2015 at 0800  . saccharomyces boulardii (FLORASTOR) 250 MG capsule Take 250 mg by mouth daily. Reported on 11/04/2015   11/18/2015 at 0800   Home medication reconciliation was completed with the patient.   Scheduled Inpatient Medications:   . amLODipine  5 mg Oral Daily  . azithromycin  250 mg Oral Daily  . budesonide (PULMICORT) nebulizer solution  0.25 mg Nebulization BID  . busPIRone  10 mg Oral BID  .  carbidopa-levodopa  1 tablet Oral TID  . cyanocobalamin  1,000 mcg Intramuscular Q30 days  . furosemide  40 mg Oral Daily  . insulin aspart  0-15 Units Subcutaneous TID WC  . insulin aspart  0-5 Units Subcutaneous QHS  . insulin glargine  70 Units Subcutaneous QHS  . ipratropium-albuterol  3 mL Nebulization Q6H  . levothyroxine  75 mcg Oral QAC breakfast  . lipase/protease/amylase  12,000 Units Oral TID  . loratadine  10 mg Oral Daily  . methylPREDNISolone (SOLU-MEDROL) injection  40 mg Intravenous Q12H  . multivitamin with minerals  1 tablet Oral Daily  . pantoprazole  40 mg Oral Daily  . pravastatin  40 mg Oral Daily  . saccharomyces boulardii  250 mg Oral Daily    Continuous Inpatient Infusions:     PRN Inpatient Medications:  acetaminophen **OR** acetaminophen, ALPRAZolam, HYDROcodone-acetaminophen, ondansetron **OR** ondansetron (ZOFRAN) IV  Family History: family history includes Heart attack in her father and mother; Heart disease in her mother; Hypertension in her mother.    Social History:   reports that she has never smoked. She has never used smokeless tobacco. She reports that she does not drink alcohol or use illicit drugs.   Review of Systems: Constitutional: Weight is stable.  Eyes: No changes in vision. ENT: No oral lesions, sore throat.  GI: see HPI.  Heme/Lymph: No easy bruising.  CV: No chest pain.  GU: No hematuria.  Integumentary: No rashes.  Neuro: No headaches.  Psych: No depression/anxiety.  Endocrine: No heat/cold intolerance.  Allergic/Immunologic: No urticaria.  Resp: No cough, SOB.  Musculoskeletal: No joint swelling.    Physical Examination: BP 154/89 mmHg  Pulse 92  Temp(Src) 98.5 F (36.9 C) (Oral)  Resp 18  Ht 4\' 11"  (1.499 m)  Wt 85.73 kg (189 lb)  BMI 38.15 kg/m2  SpO2 98%  LMP  (LMP Unknown) Gen: NAD, alert and oriented x 4, son present at bedside HEENT: PEERLA, EOMI, Neck: supple, no JVD or thyromegaly Chest: CTA  bilaterally, wheezes bilaterally, crackles, or other adventitious sounds CV: RRR, no m/g/c/r Abd: soft, NT, ND, +BS  in all four quadrants; no HSM, guarding, ridigity, or rebound tenderness Ext: no edema, well perfused with 2+ pulses, Skin: chronic ulcer from prior pancreatic surgery Lymph: no LAD  Data: Lab Results  Component Value Date   WBC 5.9 11/19/2015   HGB 13.7 11/19/2015   HCT 40.6 11/19/2015   MCV 93.5 11/19/2015   PLT 105* 11/19/2015    Recent Labs Lab 11/18/15 1138 11/19/15 0532  HGB 14.1 13.7   Lab Results  Component Value Date   NA 141 11/19/2015   K 3.9 11/19/2015   CL 102 11/19/2015   CO2 30 11/19/2015   BUN 18 11/19/2015   CREATININE 0.85 11/19/2015   Lab Results  Component Value Date   ALT 13* 11/18/2015   AST 31 11/18/2015   ALKPHOS 53 11/18/2015   BILITOT 0.6 11/18/2015    Recent Labs Lab 11/18/15 1138  INR 1.03   Assessment/Plan: [name] is a 82 y.o. female with multiple chronic comorbidities including CHF, h/o TIA on Plavix, CAD, Parkinson disease, DM II, and s/p pancreatectomy admitted with a COPD exacerbation.  She reports one episode of BRB with loose stool in the commode yesterday morning and this morning.  Vitals and Hgb remain stable.  At this time, symptoms are suggestive of a diverticular bleed.  Continue monitoring BMs/bleeding and Hgb.  Once patient stable for discharge, will plan to follow-up with as outpatient to discuss colonoscopy.  Further recs per Dr. Shelle Ironein.  Recommendations: - Monitor Hgb, transfuse if <7 - Unless bleeding worsens and/or Hgb declines, plan [facility] to discuss colon[facility]  Thank you for the consult. We will follow along with you. Please call with questions or concerns.  Burman FreestoneMichelle C Adanna Zuckerman, PA-C [facility] Phone: [phone] Pager: [phone]

## 2015-11-19 NOTE — Care Management (Addendum)
Admitted to [facility] with the diagnosis of COPD. Lives alone. Daughters are Kendal HymenBonnie [phone]) and Steward DroneBrenda [phone]) Last seen Dr. Ronna PolioJennifer Walker about a month ago. Home Health through [facility]. Home oxygen at 2 liter nasal cannula at night per [facility] x 2 - 3 years. 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 Abdominal wound covered with pink dressing. Nursing assistants during the day and prior to going to bed at night. Nursing assistants through [facility] per Ms. Estelle. Nursing assistants help with bath and dressing. Self feed. Fell yesterday. Good appetite. Life Alert in the home. Son Mellody DanceKeith,  will transport.  Gwenette GreetBrenda S Kamiah Fite RN MSN CCM Care Management [phone]

## 2015-11-19 NOTE — Care Management Important Message (Signed)
Important Message  Patient Details  Name: [name] SpringClara O Baird MRN: [id number] Date of Birth: 05/01/1929   Medicare Important Message Given:  Yes    Gwenette GreetBrenda S Jhania Etherington, RN 11/19/2015, 8:35 AM

## 2015-11-19 NOTE — Progress Notes (Signed)
[facility] - [place] at [facility]   PATIENT NAME: [name]    MR#:  [id number]  DATE OF BIRTH:  [birth year]  SUBJECTIVE:  CHIEF COMPLAINT:   Chief Complaint  Patient presents with  . Rectal Bleeding   Continues to have shortness of breath. Wheezing. Wears oxygen only at night. Here patient is needing continuous oxygen at 2 L. Bloody stool REVIEW OF SYSTEMS:    Review of Systems  Constitutional: Positive for malaise/fatigue. Negative for fever and chills.  HENT: Negative for sore throat.   Eyes: Negative for blurred vision, double vision and pain.  Respiratory: Positive for cough, sputum production, shortness of breath and wheezing. Negative for hemoptysis.   Cardiovascular: Negative for chest pain, palpitations, orthopnea and leg swelling.  Gastrointestinal: Positive for blood in stool. Negative for heartburn, nausea, vomiting, abdominal pain, diarrhea and constipation.  Genitourinary: Negative for dysuria and hematuria.  Musculoskeletal: Negative for back pain and joint pain.  Skin: Negative for rash.  Neurological: Positive for weakness. Negative for sensory change, speech change, focal weakness and headaches.  Endo/Heme/Allergies: Does not bruise/bleed easily.  Psychiatric/Behavioral: Negative for depression. The patient is not nervous/anxious.     DRUG ALLERGIES:   Allergies  Allergen Reactions  . Citalopram Hydrobromide Nausea And Vomiting and Other (See Comments)    Reaction:  Dizziness and dry mouth   . Diphenhydramine Other (See Comments)    Reaction:  Unknown   . Metoclopramide Other (See Comments)    Reaction:  Unknown   . Paroxetine Other (See Comments)    Reaction:  Hallucinations   . Tape Rash    VITALS:  Blood pressure 154/89, pulse 92, temperature 98.5 F (36.9 C), temperature source Oral, resp. rate 18, height  (1.499 m), weight 85.73 kg (189 lb), SpO2 98 %.  PHYSICAL EXAMINATION:   Physical Exam  GENERAL:  82  y.o.-year-old patient lying in the bed with Conversational dyspnea. Morbidly obese EYES: Pupils equal, round, reactive to light and accommodation. No scleral icterus. Extraocular muscles intact.  HEENT: Head atraumatic, normocephalic. Oropharynx and nasopharynx clear.  NECK:  Supple, no jugular venous distention. No thyroid enlargement, no tenderness.  LUNGS: Increased work of breathing with bilateral wheezing CARDIOVASCULAR: S1, S2 normal. No murmurs, rubs, or gallops.  ABDOMEN: Soft, nontender, nondistended. Bowel sounds present. No organomegaly or mass.  EXTREMITIES: No cyanosis, clubbing or edema b/l.    NEUROLOGIC: Cranial nerves II through XII are intact. No focal Motor or sensory deficits b/l.   PSYCHIATRIC: The patient is alert and oriented x 3.  SKIN: Chronic ulcer from pancreatic surgery.  LABORATORY PANEL:   CBC  Recent Labs Lab 11/19/15 0532  WBC 5.9  HGB 13.7  HCT 40.6  PLT 105*   ------------------------------------------------------------------------------------------------------------------ Chemistries   Recent Labs Lab 11/18/15 1138 11/19/15 0532  NA 142 141  K 3.2* 3.9  CL 100* 102  CO2 33* 30  GLUCOSE 187* 258*  BUN 19 18  CREATININE 0.88 0.85  CALCIUM 8.6* 8.9  AST 31  --   ALT 13*  --   ALKPHOS 53  --   BILITOT 0.6  --    ------------------------------------------------------------------------------------------------------------------  Cardiac Enzymes  Recent Labs Lab 11/18/15 1138  TROPONINI 0.03   ------------------------------------------------------------------------------------------------------------------  RADIOLOGY:  Dg Chest 2 View  11/18/2015  CLINICAL DATA:  82 year old female with 1 week of shortness of breath. Initial encounter. EXAM: CHEST  2 VIEW COMPARISON:  09/11/2015 and earlier. FINDINGS: Seated AP and lateral views of the chest. Chronic  cardiac event recorder re- demonstrated. Lower lung volumes. Stable cardiomegaly and  mediastinal contours. Stable pulmonary vascular congestion without overt edema. No pleural effusion, pneumothorax or consolidation identified. Osteopenia. Widespread spinal compression fractures, several of which are chronically augmented. Stable visualized surgical clips in the upper abdomen. Calcified aortic atherosclerosis. IMPRESSION: Low lung volumes. Stable cardiomegaly and vascular congestion without overt edema. No new cardiopulmonary abnormality identified. Electronically Signed   By: Odessa FlemingH  Hall M.D.   On: 11/18/2015 13:28     ASSESSMENT AND PLAN:   82 year old female with past medical history of diabetes, hypertension, hyperlipidemia, anxiety, history of pancreatitis with pancreatectomy, COPD, depression, restless leg syndrome, Parkinson's disease, coronary artery disease, who presents to the hospital with an episode of rectal bleeding also noted to have cough and wheezing.  # Acute on chronic respiratory failure due to COPD exacerbation -IV steroids, Antibiotics - Scheduled Nebulizers - Inhalers -Wean O2 as tolerated - Consult pulmonary if no improvement  # Rectal bleeding 1 episode at home and 1 today. Bright Blood in stool. Plavix held. Stop Lovenox Request GI to see the patient. Possible diverticular bleed. Hemoglobin stable. Transfuse as needed.  # Diabetes mellitus type 2 Sliding scale insulin. Home medications. Blood sugars are elevated due to steroids.  # history of Parkinson's disease-continue Sinemet.  # Chronic diastolic CHF-patient clinically is not in congestive heart failure Continue home dose Lasix. No orthopnea  # hyperlipidemia Pravachol.  # GERD Protonix.  # history of pancreatitis with partial pancreatectomy continue pancreatic lipase supplements.  # hypothyroidism-continue Synthroid.  # DVT prophylaxis with SCDs. No Lovenox or heparin due to GI bleed.   All the records are reviewed and case discussed with Care Management/Social  Workerr. Management plans discussed with the patient, family and they are in agreement.  CODE STATUS: FULL CODE  DVT Prophylaxis: SCDs  TOTAL TIME TAKING CARE OF THIS PATIENT: 35 minutes.   POSSIBLE D/C IN 2-3 DAYS, DEPENDING ON CLINICAL CONDITION.  Milagros LollSudini, Cobi Delph R M.D on 11/19/2015 at 11:24 AM  Between 7am to 6pm - Pager - [phone]  After 6pm go to [email or web address] - password [email or web address]  Fabio Neighborsagle [place] [facility]  Office  [phone]  CC: Primary care physician; Wynona DoveWALKER,[name] AZBELL, MD  Note: This dictation was prepared with Dragon dictation along with smaller phrase technology. Any transcriptional errors that result from this process are unintentional.

## 2015-11-19 NOTE — Progress Notes (Signed)
[facility]  Patient Status: active   [facility] is providing the following services: SN/OT  If patient discharges after hours, please call [phone].   [name] GerlachJason E [name] 11/19/2015, 11:19 AM

## 2015-11-20 ENCOUNTER — Other Ambulatory Visit: Payer: Self-pay | Admitting: *Deleted

## 2015-11-20 ENCOUNTER — Ambulatory Visit: Payer: Self-pay | Admitting: Internal Medicine

## 2015-11-20 LAB — GLUCOSE, CAPILLARY
GLUCOSE-CAPILLARY: 216 mg/dL — AB (ref 65–99)
GLUCOSE-CAPILLARY: 241 mg/dL — AB (ref 65–99)
Glucose-Capillary: 260 mg/dL — ABNORMAL HIGH (ref 65–99)
Glucose-Capillary: 254 mg/dL — ABNORMAL HIGH (ref 65–99)

## 2015-11-20 LAB — OSMOLALITY: OSMOLALITY: 313 mosm/kg — AB (ref 275–295)

## 2015-11-20 LAB — HEMOGLOBIN: HEMOGLOBIN: 13.5 g/dL (ref 12.0–16.0)

## 2015-11-20 MED ORDER — METHYLPREDNISOLONE SODIUM SUCC 40 MG IJ SOLR
40.0000 mg | Freq: Every day | INTRAMUSCULAR | Status: DC
Start: 2015-11-21 — End: 2015-11-21
  Administered 2015-11-21: 40 mg via INTRAVENOUS
  Filled 2015-11-20: qty 1

## 2015-11-20 MED ORDER — FUROSEMIDE 10 MG/ML IJ SOLN
40.0000 mg | Freq: Once | INTRAMUSCULAR | Status: AC
  Administered 2015-11-20: 40 mg via INTRAVENOUS
  Filled 2015-11-20: qty 4

## 2015-11-20 MED ORDER — INSULIN ASPART 100 UNIT/ML ~~LOC~~ SOLN
6.0000 [IU] | Freq: Three times a day (TID) | SUBCUTANEOUS | Status: DC
  Administered 2015-11-20 – 2015-11-21 (×2): 6 [IU] via SUBCUTANEOUS
  Filled 2015-11-20 (×2): qty 6

## 2015-11-20 NOTE — Consult Note (Addendum)
   [facility] CM [facility] Consult   11/20/2015  Cloria SpringClara O Winer 09/06/1928 [id number]   Patient is [facility] with [facility] as a benefit of her traditional Medicare Plan for telephonic chronic disease management services.  Patient has been engaged by a [facility] health coach and [facility] pharmacist. Consent form signed. Patient gave consent to speak to her son Elsworth SohoKenneth Grondin [phone]. Our community based plan of care has focused on disease management and community resource support.  Patient will receive a post discharge transition of care call and will be evaluated for monthly home visits for assessments and disease process education. Of note, [facility] Care Management services does not replace or interfere with any services that are needed or arranged by inpatient case management or social work.  For additional questions or referrals please contact:  Myron Stankovich RN, BSN [facility]  Hospital Liaison  [phone]) Business Mobile [phone] Toll free office

## 2015-11-20 NOTE — Progress Notes (Signed)
Inpatient Diabetes Program Recommendations  AACE/ADA: New Consensus Statement on Inpatient Glycemic Control (2015)  Target Ranges:  Prepandial:   less than 140 mg/dL      Peak postprandial:   less than 180 mg/dL (1-2 hours)      Critically ill patients:  140 - 180 mg/dL   Review of Glycemic Control  Results for [name], Rolla O (MRN [id number]) as of 11/20/2015 10:35  Ref. Range 11/19/2015 08:42 11/19/2015 11:43 11/19/2015 16:34 11/19/2015 21:54 11/20/2015 07:33  Glucose-Capillary Latest Ref Range: 65-99 mg/dL 914252 (H) 782350 (H) 956292 (H) 289 (H) 216 (H)    Diabetes history: Type 2 Outpatient Diabetes medications: Lantus 70 units qhs, Humalog 8-18 units tid Current orders for Inpatient glycemic control: Lantus 70 units qhs, Novolog correction 0-15 units tid, Novolog 0-5 units qhs  *steroids qday  Inpatient Diabetes Program Recommendations:  Please consider starting Novolog mealtime insulin 8 units tid with meals - patient takes a set amount of mealtime insulin at home.   Continue Novolog correction as ordered to correct elevations.  Decrease as the steroids are tapered.   Susette [name], RN, BA, MHA, CDE Diabetes Coordinator Inpatient Diabetes Program  [phone] (Team Pager) [phone] [facility] Office) 11/20/2015 10:39 AM

## 2015-11-20 NOTE — Progress Notes (Signed)
[facility] [place] at [facility]   PATIENT NAME: Faduma Cho    MR#:  [id number]  DATE OF BIRTH:  1928-09-20  SUBJECTIVE:  CHIEF COMPLAINT:   Chief Complaint  Patient presents with  . Rectal Bleeding   Continues to have shortness of breath. Wheezing. Wears oxygen only at night. Here patient is needing continuous oxygen at 2 L. No further blood in stool. Home health aid at bedside REVIEW OF SYSTEMS:    Review of Systems  Constitutional: Positive for malaise/fatigue. Negative for fever and chills.  HENT: Negative for sore throat.   Eyes: Negative for blurred vision, double vision and pain.  Respiratory: Positive for cough, sputum production, shortness of breath and wheezing. Negative for hemoptysis.   Cardiovascular: Negative for chest pain, palpitations, orthopnea and leg swelling.  Gastrointestinal: Positive for blood in stool. Negative for heartburn, nausea, vomiting, abdominal pain, diarrhea and constipation.  Genitourinary: Negative for dysuria and hematuria.  Musculoskeletal: Negative for back pain and joint pain.  Skin: Negative for rash.  Neurological: Positive for weakness. Negative for sensory change, speech change, focal weakness and headaches.  Endo/Heme/Allergies: Does not bruise/bleed easily.  Psychiatric/Behavioral: Negative for depression. The patient is not nervous/anxious.     DRUG ALLERGIES:   Allergies  Allergen Reactions  . Citalopram Hydrobromide Nausea And Vomiting and Other (See Comments)    Reaction:  Dizziness and dry mouth   . Diphenhydramine Other (See Comments)    Reaction:  Unknown   . Metoclopramide Other (See Comments)    Reaction:  Unknown   . Paroxetine Other (See Comments)    Reaction:  Hallucinations   . Tape Rash    VITALS:  Blood pressure 146/64, pulse 99, temperature 98.2 F (36.8 C), temperature source Oral, resp. rate 20, height  (1.499 m), weight 85.73 kg (189 lb), SpO2 95 %.  PHYSICAL  EXAMINATION:   Physical Exam  GENERAL:  82 y.o.-year-old patient lying in the bed with Conversational dyspnea. Morbidly obese EYES: Pupils equal, round, reactive to light and accommodation. No scleral icterus. Extraocular muscles intact.  HEENT: Head atraumatic, normocephalic. Oropharynx and nasopharynx clear.  NECK:  Supple, no jugular venous distention. No thyroid enlargement, no tenderness.  LUNGS: Increased work of breathing with bilateral wheezing CARDIOVASCULAR: S1, S2 normal. No murmurs, rubs, or gallops.  ABDOMEN: Soft, nontender, nondistended. Bowel sounds present. No organomegaly or mass.  EXTREMITIES: No cyanosis, clubbing or edema b/l.    NEUROLOGIC: Cranial nerves II through XII are intact. No focal Motor or sensory deficits b/l.   PSYCHIATRIC: The patient is alert and oriented x 3.  SKIN: Chronic ulcer from pancreatic surgery.  LABORATORY PANEL:   CBC  Recent Labs Lab 11/19/15 0532  11/20/15 0442  WBC 5.9  --   --   HGB 13.7  < > 13.5  HCT 40.6  --   --   PLT 105*  --   --   < > = values in this interval not displayed. ------------------------------------------------------------------------------------------------------------------ Chemistries   Recent Labs Lab 11/18/15 1138 11/19/15 0532  NA 142 141  K 3.2* 3.9  CL 100* 102  CO2 33* 30  GLUCOSE 187* 258*  BUN 19 18  CREATININE 0.88 0.85  CALCIUM 8.6* 8.9  AST 31  --   ALT 13*  --   ALKPHOS 53  --   BILITOT 0.6  --    ------------------------------------------------------------------------------------------------------------------  Cardiac Enzymes  Recent Labs Lab 11/18/15 1138  TROPONINI 0.03   ------------------------------------------------------------------------------------------------------------------  RADIOLOGY:  No results found.   ASSESSMENT AND PLAN:   82 year old female with past medical history of diabetes, hypertension, hyperlipidemia, anxiety, history of pancreatitis with  pancreatectomy, COPD, depression, restless leg syndrome, Parkinson's disease, coronary artery disease, who presents to the hospital with an episode of rectal bleeding also noted to have cough and wheezing.  # Acute on chronic respiratory failure due to COPD exacerbation -IV steroids, Antibiotics - Scheduled Nebulizers - Inhalers -Wean O2 as tolerated Continues to be short of breath with significant wheezing. We will need inpatient treatment.   # Rectal bleeding 1 episode at home and 1 in hospital. Bright Blood in stool. Plavix held. Stopped LovenHemoglobin stable. Plan is to have patient follow-up with GI as outpatient. Seen by Dr. Shelle Ironein in hospital.  # Diabetes mellitus type 2 Sliding scale insulin. Home medications. long-acting Lantus  Blood sugars are elevated due to steroids. Added pre-meal NovoLog  # history of Parkinson's disease-continue Sinemet.  # Chronic diastolic CHF-patient clinically is not in congestive heart failure Continue home dose Lasix.  # hyperlipidemia Pravachol.  # GERD Protonix.  # history of pancreatitis with partial pancreatectomy continue pancreatic lipase supplements.  # hypothyroidism-continue Synthroid.  # DVT prophylaxis with SCDs. No Lovenox or heparin due to GI bleed.   All the records are reviewed and case discussed with Care Management/Social Workerr. Management plans discussed with the patient, family and they are in agreement.  CODE STATUS: FULL CODE  DVT Prophylaxis: SCDs  TOTAL TIME TAKING CARE OF THIS PATIENT: 35 minutes.  Possible discharge in 1-2 days.   Milagros LollSudini, Avin Gibbons R M.D on 11/20/2015 at 2:44 PM  Between 7am to 6pm - Pager - [phone]  After 6pm go to [email or web address] - password [email or web address]  [facility]  Office  [phone]  CC: Primary care physician; Wynona DoveWALKER,[name] AZBELL, MD  Note: This dictation was prepared with Dragon dictation along with smaller phrase technology. Any transcriptional  errors that result from this process are unintentional.

## 2015-11-21 LAB — GLUCOSE, CAPILLARY: Glucose-Capillary: 206 mg/dL — ABNORMAL HIGH (ref 65–99)

## 2015-11-21 MED ORDER — AZITHROMYCIN 250 MG PO TABS: 250.0000 mg | ORAL_TABLET | Freq: Every day | ORAL | Status: DC

## 2015-11-21 MED ORDER — PREDNISONE 50 MG PO TABS: 50.0000 mg | ORAL_TABLET | Freq: Every day | ORAL | Status: DC

## 2015-11-21 NOTE — Progress Notes (Signed)
Pt discharge home via daughter-instructions given to patient-no concerns verbalized.

## 2015-11-21 NOTE — Discharge Instructions (Signed)
DIET:  Cardiac diet and Diabetic diet  DISCHARGE CONDITION:  Stable  ACTIVITY:  Activity as tolerated  OXYGEN:  Home Oxygen: Yes.     Oxygen Delivery: 2 liters/min via Patient connected to nasal cannula oxygen  DISCHARGE LOCATION:  home   If you experience worsening of your admission symptoms, develop shortness of breath, life threatening emergency, suicidal or homicidal thoughts you must seek medical attention immediately by calling 911 or calling your MD immediately  if symptoms less severe.  You Must read complete instructions/literature along with all the possible adverse reactions/side effects for all the Medicines you take and that have been prescribed to you. Take any new Medicines after you have completely understood and accpet all the possible adverse reactions/side effects.   Please note  You were cared for by a hospitalist during your hospital stay. If you have any questions about your discharge medications or the care you received while you were in the hospital after you are discharged, you can call the unit and asked to speak with the hospitalist on call if the hospitalist that took care of you is not available. Once you are discharged, your primary care physician will handle any further medical issues. Please note that NO REFILLS for any discharge medications will be authorized once you are discharged, as it is imperative that you return to your primary care physician (or establish a relationship with a primary care physician if you do not have one) for your aftercare needs so that they can reassess your need for medications and monitor your lab values.  Hold Plavix till you see your doctor. Can restart in 1 week if no further blood in stool.  Chronic Obstructive Pulmonary Disease Exacerbation Chronic obstructive pulmonary disease (COPD) is a common lung problem. In COPD, the flow of air from the lungs is limited. COPD exacerbations are times that breathing gets worse and  you need extra treatment. Without treatment they can be life threatening. If they happen often, your lungs can become more damaged. If your COPD gets worse, your doctor may treat you with:  Medicines.  Oxygen.  Different ways to clear your airway, such as using a mask. HOME CARE  Do not smoke.  Avoid tobacco smoke and other things that bother your lungs.  If given, take your antibiotic medicine as told. Finish the medicine even if you start to feel better.  Only take medicines as told by your doctor.  Drink enough fluids to keep your pee (urine) clear or pale yellow (unless your doctor has told you not to).  Use a cool mist machine (vaporizer).  If you use oxygen or a machine that turns liquid medicine into a mist (nebulizer), continue to use them as told.  Keep up with shots (vaccinations) as told by your doctor.  Exercise regularly.  Eat healthy foods.  Keep all doctor visits as told. GET HELP RIGHT AWAY IF:  You are very short of breath and it gets worse.  You have trouble talking.  You have bad chest pain.  You have blood in your spit (sputum).  You have a fever.  You keep throwing up (vomiting).  You feel weak, or you pass out (faint).  You feel confused.  You keep getting worse. MAKE SURE YOU:  Understand these instructions.  Will watch your condition.  Will get help right away if you are not doing well or get worse.   This information is not intended to replace advice given to you by your health care  provider. Make sure you discuss any questions you have with your health care provider.   Document Released: 07/28/2011 Document Revised: 08/29/2014 Document Reviewed: 04/12/2013 Elsevier Interactive Patient Education [employer]

## 2015-11-21 NOTE — Care Management Note (Signed)
Case Management Note  Patient Details  Name: Cloria SpringClara O Evola MRN: [id number] Date of Birth: 08/22/1928  Subjective/Objective:       Mrs [name] is currently an active client of [facility]. Order to resume home health OT and RN  services and to add PT was called and faxed to [facility]. [facility] already supplies Mrs Lloyds home oxygen.              Action/Plan:   Expected Discharge Date:                  Expected Discharge Plan:     In-House Referral:     Discharge planning Services     Post Acute Care Choice:    Choice offered to:     DME Arranged:    DME Agency:     HH Arranged:    HH Agency:     Status of Service:     Medicare Important Message Given:  Yes Date Medicare IM Given:    Medicare IM give by:    Date Additional Medicare IM Given:    Additional Medicare Important Message give by:     If discussed at Long Length of Stay Meetings, dates discussed:    Additional Comments:  Carl Bleecker A, RN 11/21/2015, 11:41 AM

## 2015-11-23 ENCOUNTER — Other Ambulatory Visit: Payer: Self-pay | Admitting: *Deleted

## 2015-11-23 ENCOUNTER — Telehealth: Payer: Self-pay

## 2015-11-23 NOTE — Telephone Encounter (Signed)
Called patient to follow up with transitional care management.  Patient stated she was tired and requested I call back tomorrow to schedule an appointment with PCP and appointment.  Will follow as appropriate.

## 2015-11-23 NOTE — Patient Outreach (Signed)
[facility] Care Management  11/23/2015  Cloria SpringClara O Costabile 06/06/1929 [id number]  Transition of care call  RNCM placed telephone call to Elsworth SohoKenneth Kuck, son of Mrs.Glassburn per her request to discuss her discharge instructions and follow up. Iantha FallenKenneth requested that I call back on tomorrow.  Plan I will return call on 4/4 to complete transition of care call.  Egbert GaribaldiKimberly Ayonna Speranza, RN, [facility] Care Management [phone]- Mobile [phone]- Toll Free Main Office

## 2015-11-23 NOTE — Patient Outreach (Signed)
[facility] Care Management  11/23/2015  Cloria SpringClara O [name] 05/18/1929 [id number]   Transition of care RNCM placed initial transition of care call to Mrs. [name], HIPAA verified, patient states she could not talk right now requested that I call back, a little later her son [name] that she has given verbal consent for use to speak  should be there soon at her home, and she was waiting for the home health aide to arrive about 5 pm..   Plan Will attempt to contact patient later today or make contact with her son.   [name] GaribaldiKimberly Deakon Frix, RN, [facility] Care Management [phone]- Mobile [phone]- Toll Free Main Office

## 2015-11-23 NOTE — Discharge Summary (Signed)
[facility] [place] at [facility]   PATIENT NAME: [name] SequinClara [name]    MR#:  [id number]  DATE OF BIRTH:  03/20/1929  DATE OF ADMISSION:  11/18/2015 ADMITTING PHYSICIAN: [name] SirenVivek J Sainani, MD  DATE OF DISCHARGE: 11/21/2015 11:56 AM  PRIMARY CARE PHYSICIAN: [name] DoveWALKER,[name] AZBELL, MD   ADMISSION DIAGNOSIS:  Hypokalemia [E87.6] Acute GI bleeding [K92.2] Congestive heart failure, unspecified congestive heart failure chronicity, unspecified congestive heart failure type (HCC) [I50.9] Chronic obstructive pulmonary disease, unspecified COPD type (HCC) [J44.9]  DISCHARGE DIAGNOSIS:  Active Problems:   COPD exacerbation (HCC)   SECONDARY DIAGNOSIS:   Past Medical History  Diagnosis Date  . Anxiety   . Chronic diastolic CHF (congestive heart failure) (HCC)   . Depression   . Diabetes mellitus     Followed by Dr. Tedd SiasSolum at [facility]  . GERD (gastroesophageal reflux disease)   . Hypertension   . Hypothyroidism   . Osteoporosis   . Allergy   . Hyperlipidemia   . RLS (restless legs syndrome)   . Pancreatic disease     pancreatic failure  . Urinary incontinence   . Pancreatitis     Pancreatic resection/ open wound- [facility] 11/04  . Pneumonia     Pneumonia/ Emphysema 05/05  . 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 TIA (transient ischemic attack)     recurrent 12/15  . History of pleural empyema   . Sepsis (HCC)   . Frequent falls   . Arthritis     [facility]  . CAD (coronary artery disease), native coronary artery      1//15  T mid-LAD  stent OM2  . Parkinson's disease (HCC)      ADMITTING HISTORY  [name] is a 82 y.o. female with a known history of Chronic diastolic CHF, diabetes, GERD, hypertension, hypothyroidism, osteoporosis, restless leg syndrome, urinary incontinence, anxiety, history of osteoarthritis who presents to the hospital due to one episode of rectal bleeding and also having some  cough and shortness of breath. Patient said she had one episode of rectal bleeding which she quantifies to maybe half a Cup. She denies any abdominal pain, fever, chills, nausea, vomiting associated with this. She also says that she's had a cough, wheezing and shortness of breath ongoing for the past few days. She also admits to a low-grade fever but she cannot remember ever checking it. Patient denies any sick contacts or any other associated symptoms. Patient presented to the emergency room and was noted to be in COPD exacerbation and hospitalist services were contacted further treatment and evaluation.  HOSPITAL COURSE:   82 year old female with past medical history of diabetes, hypertension, hyperlipidemia, anxiety, history of pancreatitis with pancreatectomy, COPD, depression, restless leg syndrome, Parkinson's disease, coronary artery disease, who presents to the hospital with an episode of rectal bleeding also noted to have cough and wheezing.  # Acute on chronic respiratory failure due to COPD exacerbation - steroids, Antibiotics - Scheduled Nebulizers - Inhalers Changed to oral prednisone and azithromycin at discharge. Patient normally uses oxygen only at night and presently is needing 24 hours. She does have baseline wheezing and shortness of breath and patient feels back to normal.  # Rectal bleeding 1 episode at home and 1 in hospital. Bright Blood in stool. Plavix held.  Hemoglobin stable. Plan is to have patient follow-up with GI as outpatient. Seen by Dr. Shelle Ironein in hospital. No further bleeding. Patient can restart  Plavix when she follows up with GI or her physician if there is no further blood in stool.  # Diabetes mellitus type 2 Sliding scale insulin. Home medications. long-acting Lantus  Uncontrolled in hospital due to steroids.  # history of Parkinson's disease-continue Sinemet.  # Chronic diastolic CHF-patient clinically is not in congestive heart failure Continued  home dose Lasix.  # hyperlipidemia Pravachol.  # GERD Protonix.  # History of pancreatitis with partial pancreatectomy continue pancreatic lipase supplements.  # hypothyroidism-continue Synthroid.  # DVT prophylaxis with SCDs. No Lovenox or heparin due to GI bleed in hospital  Stable for discharge home to follow-up with her primary care physician and GI.   CONSULTS OBTAINED:  Treatment Team:  Elnita Maxwell, MD  DRUG ALLERGIES:   Allergies  Allergen Reactions  . Citalopram Hydrobromide Nausea And Vomiting and Other (See Comments)    Reaction:  Dizziness and dry mouth   . Diphenhydramine Other (See Comments)    Reaction:  Unknown   . Metoclopramide Other (See Comments)    Reaction:  Unknown   . Paroxetine Other (See Comments)    Reaction:  Hallucinations   . Tape Rash    DISCHARGE MEDICATIONS:   Discharge Medication List as of 11/21/2015 11:04 AM    START taking these medications   Details  azithromycin (ZITHROMAX) 250 MG tablet Take 1 tablet (250 mg total) by mouth daily., Starting 11/21/2015, Until Discontinued, Normal    predniSONE (DELTASONE) 50 MG tablet Take 1 tablet (50 mg total) by mouth daily with breakfast., Starting 11/21/2015, Until Discontinued, Normal      CONTINUE these medications which have NOT CHANGED   Details  albuterol (PROVENTIL HFA;VENTOLIN HFA) 108 (90 BASE) MCG/ACT inhaler Inhale 2 puffs into the lungs every 6 (six) hours as needed for wheezing or shortness of breath., Starting 08/03/2015, Until Discontinued, Normal    ALPRAZolam (XANAX) 1 MG tablet Take 0.5-1 mg by mouth 3 (three) times daily as needed for anxiety. Take 0.5mg  in morning, take 0.5mg  at lunch, and take 1mg  at bedtime if needed, Until Discontinued, Historical Med    amLODipine (NORVASC) 5 MG tablet Take 1 tablet (5 mg total) by mouth daily., Starting 07/07/2015, Until Discontinued, Normal    budesonide (ENTOCORT EC) 3 MG 24 hr capsule Take 9 mg by mouth daily. , Until  Discontinued, Historical Med    busPIRone (BUSPAR) 10 MG tablet Take 10 mg by mouth 2 (two) times daily., Until Discontinued, Historical Med    carbidopa-levodopa (SINEMET IR) 25-100 MG tablet Take 1 tablet by mouth 3 (three) times daily., Until Discontinued, Historical Med    cetirizine (ZYRTEC) 10 MG tablet Take 10 mg by mouth daily as needed for allergies. , Until Discontinued, Historical Med    clopidogrel (PLAVIX) 75 MG tablet Take 75 mg by mouth daily., Until Discontinued, Historical Med    cyanocobalamin (,VITAMIN B-12,) 1000 MCG/ML injection Inject 1 mL (1,000 mcg total) into the muscle every 30 (thirty) days., Starting 06/01/2015, Until Discontinued, Normal    furosemide (LASIX) 40 MG tablet Take 1 tablet (40 mg total) by mouth daily., Starting 12/02/2014, Until Discontinued, Normal    HYDROcodone-acetaminophen (NORCO/VICODIN) 5-325 MG tablet Take 1-2 tablets by mouth 3 (three) times daily as needed for moderate pain., Starting 11/02/2015, Until Discontinued, Print    insulin glargine (LANTUS) 100 unit/mL SOPN Inject 70 Units into the skin at bedtime., Until Discontinued, Historical Med    insulin lispro (HUMALOG) 100 UNIT/ML injection Inject 0.08-0.18 mLs (8-18 Units total) into  the skin 3 (three) times daily. Pt uses per sliding scale., Starting 10/19/2015, Until Discontinued, Normal    levothyroxine (SYNTHROID, LEVOTHROID) 75 MCG tablet Take 75 mcg by mouth daily., Until Discontinued, Historical Med    Multiple Vitamin (MULTIVITAMIN WITH MINERALS) TABS tablet Take 1 tablet by mouth daily., Until Discontinued, Historical Med    omeprazole (PRILOSEC) 20 MG capsule Take 1 capsule (20 mg total) by mouth daily., Starting 06/01/2015, Until Discontinued, Normal    Pancrelipase, Lip-Prot-Amyl, (ZENPEP) 15000 units CPEP Take 1 capsule by mouth 3 (three) times daily., Until Discontinued, Historical Med    pravastatin (PRAVACHOL) 40 MG tablet Take 40 mg by mouth daily. , Until Discontinued,  Historical Med    saccharomyces boulardii (FLORASTOR) 250 MG capsule Take 250 mg by mouth daily. Reported on 11/04/2015, Until Discontinued, Historical Med      STOP taking these medications     amoxicillin-clavulanate (AUGMENTIN) 500-125 MG tablet         Today   VITAL SIGNS:  Blood pressure 132/69, pulse 88, temperature 98.7 F (37.1 C), temperature source Oral, resp. rate 18, height 4\' 11"  (1.499 m), weight 85.73 kg (189 lb), SpO2 96 %.  I/O:  No intake or output data in the 24 hours ending 11/23/15 1519  PHYSICAL EXAMINATION:  Physical Exam  GENERAL:  82 y.o.-year-old patient lying in the bed with no acute distress. Obese LUNGS: Mild expiratory wheezing. CARDIOVASCULAR: S1, S2 normal. No murmurs, rubs, or gallops.  ABDOMEN: Soft, non-tender, non-distended. Bowel sounds present. No organomegaly or mass.  NEUROLOGIC: Moves all 4 extremities. PSYCHIATRIC: The patient is alert and awake. SKIN: No obvious rash, lesion, or ulcer.   DATA REVIEW:   CBC  Recent Labs Lab 11/19/15 0532  11/20/15 0442  WBC 5.9  --   --   HGB 13.7  < > 13.5  HCT 40.6  --   --   PLT 105*  --   --   < > = values in this interval not displayed.  Chemistries   Recent Labs Lab 11/18/15 1138 11/19/15 0532  NA 142 141  K 3.2* 3.9  CL 100* 102  CO2 33* 30  GLUCOSE 187* 258*  BUN 19 18  CREATININE 0.88 0.85  CALCIUM 8.6* 8.9  AST 31  --   ALT 13*  --   ALKPHOS 53  --   BILITOT 0.6  --     Cardiac Enzymes  Recent Labs Lab 11/18/15 1138  TROPONINI 0.03    Microbiology Results  Results for orders placed or performed during the hospital encounter of 09/11/15  Culture, blood (Routine X 2) w Reflex to ID Panel     Status: None   Collection Time: 09/11/15 12:50 PM  Result Value Ref Range Status   Specimen Description BLOOD LEFT ANTECUBITAL  Final   Special Requests BOTTLES DRAWN AEROBIC AND ANAEROBIC  3CC  Final   Culture NO GROWTH 5 DAYS  Final   Report Status 09/16/2015 FINAL   Final  Culture, blood (Routine X 2) w Reflex to ID Panel     Status: None   Collection Time: 09/11/15  1:52 PM  Result Value Ref Range Status   Specimen Description BLOOD LEFT ANTECUBITAL  Final   Special Requests BOTTLES DRAWN AEROBIC AND ANAEROBIC  4CC  Final   Culture NO GROWTH 5 DAYS  Final   Report Status 09/16/2015 FINAL  Final  C difficile quick scan w PCR reflex     Status: Abnormal   Collection Time:  09/11/15  1:52 PM  Result Value Ref Range Status   C Diff antigen POSITIVE (A) NEGATIVE Final   C Diff toxin NEGATIVE NEGATIVE Final   C Diff interpretation   Final    Negative for toxigenic C. difficile. Toxin gene and active toxin production not detected. May be a nontoxigenic strain of C. difficile bacteria present, lacking the ability to produce toxin.  Gastrointestinal Panel by PCR , Stool     Status: None   Collection Time: 09/11/15  1:52 PM  Result Value Ref Range Status   Campylobacter species NOT DETECTED NOT DETECTED Final   Plesimonas shigelloides NOT DETECTED NOT DETECTED Final   Salmonella species NOT DETECTED NOT DETECTED Final   Yersinia enterocolitica NOT DETECTED NOT DETECTED Final   Vibrio species NOT DETECTED NOT DETECTED Final   Vibrio cholerae NOT DETECTED NOT DETECTED Final   Enteroaggregative E coli (EAEC) NOT DETECTED NOT DETECTED Final   Enteropathogenic E coli (EPEC) NOT DETECTED NOT DETECTED Final   Enterotoxigenic E coli (ETEC) NOT DETECTED NOT DETECTED Final   Shiga like toxin producing E coli (STEC) NOT DETECTED NOT DETECTED Final   E. coli O157 NOT DETECTED NOT DETECTED Final   Shigella/Enteroinvasive E coli (EIEC) NOT DETECTED NOT DETECTED Final   Cryptosporidium NOT DETECTED NOT DETECTED Final   Cyclospora cayetanensis NOT DETECTED NOT DETECTED Final   Entamoeba histolytica NOT DETECTED NOT DETECTED Final   Giardia lamblia NOT DETECTED NOT DETECTED Final   Adenovirus F40/41 NOT DETECTED NOT DETECTED Final   Astrovirus NOT DETECTED NOT DETECTED  Final   Norovirus GI/GII NOT DETECTED NOT DETECTED Final   Rotavirus A NOT DETECTED NOT DETECTED Final   Sapovirus (I, II, IV, and V) NOT DETECTED NOT DETECTED Final  Clostridium Difficile by PCR     Status: None   Collection Time: 09/11/15  1:52 PM  Result Value Ref Range Status   Toxigenic C Difficile by pcr NEGATIVE NEGATIVE Final  Urine culture     Status: None   Collection Time: 09/13/15 12:17 PM  Result Value Ref Range Status   Specimen Description URINE, CATHETERIZED  Final   Special Requests Normal  Final   Culture   Final    MULTIPLE SPECIES PRESENT, SUGGEST RECOLLECTION Probably contamination with fecal flora.    Report Status 09/14/2015 FINAL  Final    RADIOLOGY:  No results found.  Follow up with PCP in 1 week.  Management plans discussed with the patient, family and they are in agreement.  CODE STATUS:  Code Status History    Date Active Date Inactive Code Status Order ID Comments User Context   11/18/2015  3:40 PM 11/21/2015  3:00 PM Full Code [id number]  [name] Siren, MD Inpatient   09/11/2015  2:29 PM 09/14/2015  6:52 PM Full Code [id number]  Milagros Loll, MD ED   03/12/2015  2:30 PM 03/14/2015  2:54 PM DNR [id number]  Shaune Pollack, MD Inpatient   02/12/2015  4:25 AM 02/13/2015  6:20 PM DNR [id number]  Oralia Manis, MD Inpatient    Advance Directive Documentation        Most Recent Value   Type of Advance Directive  Healthcare Power of Attorney   Pre-existing out of facility DNR order (yellow form or pink MOST form)     "MOST" Form in Place?        TOTAL TIME TAKING CARE OF THIS PATIENT ON DAY OF DISCHARGE: more than 30 minutes.   Milagros Loll R M.D on 11/23/2015  at 3:19 PM  Between 7am to 6pm - Pager - [phone]  After 6pm go to [email or web address] - password [email or web address]  [facility]  Office  [phone]  CC: Primary care physician; [name] Dove, MD  Note: This dictation was prepared with Dragon dictation along with smaller phrase  technology. Any transcriptional errors that result from this process are unintentional.

## 2015-11-24 ENCOUNTER — Encounter: Payer: Self-pay | Admitting: *Deleted

## 2015-11-24 ENCOUNTER — Other Ambulatory Visit: Payer: Self-pay | Admitting: *Deleted

## 2015-11-24 ENCOUNTER — Telehealth: Payer: Self-pay

## 2015-11-24 NOTE — Telephone Encounter (Signed)
Transition Care Management Follow-up Telephone Call   Date discharged? 11/21/15   How have you been since you were released from the hospital? I feel very weak.  I am still coughing and wheezing with some shortness of breath.  Inhaler in use when needed.  No signs of bleeding.  No ABD pain.  Decrease in appetite.  No N/V.   Do you understand why you were in the hospital? YES, I had blood in my stool and my COPD flared up.   Do you understand the discharge instructions? YES, home oxygen in use, still taking the two new medications and I am moving slowly between activities.  Home health aides are helping me too.   Where were you discharged to?  Home.   Items Reviewed:  Medications reviewed: YES, started taking ZITHROMAX and PREDNISONE without issues.  PLAVIX held until follow up with PCP or GI.  Taking all other scheduled medications.  Allergies reviewed: YES, no changes.  Dietary changes reviewed: YES, regular diet, no problems.  Referrals reviewed: YES, PCP appointment scheduled.  Son is to follow up with GI later today, per her.  Encouraged to call our office if any assistance is needed.   Functional Questionnaire:   Activities of Daily Living (ADLs):   She states they are independent in the following: Toileting, self feeding, grooming. States they require assistance with the following: Ambulating (uses walker), bathing, dressing, meal prep. HH assists.   Any transportation issues/concerns?: NO, son will bring her.   Any patient concerns? None at this time.     Confirmed importance and date/time of follow-up visits scheduled YES, appointment scheduled 11/25/15 at 11:00.  Provider Appointment booked with Dr. Dan HumphreysWalker (PCP).  Confirmed with patient if condition begins to worsen call PCP or go to the ER.  Patient was given the office number and encouraged to call back with question or concerns.  : YES, patient verbalized understanding.

## 2015-11-24 NOTE — Patient Outreach (Signed)
[facility] [name]) Care Management  11/24/2015  [name] Mar 02, 1929 [id number]  Transition of care call  RNCM placed call to [name] as part of the transition of care program, HIPAA verified,  Patient reports that she is tired on today and not feeling well.  [name] reports that her blood sugar is 184 on today. Patient states that she wears her oxygen at 2 liters at all times. Patient denies increase in shortness of breath, cough or sputum production. Reinforced importance of PCP follow up, and notifying MD of sooner of unrelieved symptoms, new concerns. Patient reports taking her medications, insulin daily as her pill organizer filled per her daughter.   Patient reports that she had diarrhea on yesterday and her son gave her diarrhea pills, she states no blood seen in stools and no diarrhea on today.  [name] has PCP appointment scheduled for tomorrow at 11:00, patient states she is weak and concerned about how she is going to make it to appointment and how she will get there, she states  that I could call her son [name]. Patient states she has a wheelchair she can use if needed for tomorrow.  In trying to contact patient son [name], I inadvertently called [name] daughter [name], HIPAA verified, explained [facility] transition of care and difference between [name] and that our services do not interfere with any home health services she has in place.  Placed phone call to patient's son [name] to discuss patient concern regarding her transportation to appointment, he states that he would talk with his other sister and decide who would assist her to appointment in am. I also reminded [name] regarding patient need for follow up appointment with Dr.Rein, GI  For 1 week, he states that he would make sure that appointment gets made.  [name] currently has her home health aide with her now, she has aide for 4 hours in the am and 3 hours in the evening. [name] uses her walker  to get around her home and denies having a recent fall.  Patient has given verbal [name] that we are able to speak with her 4 children  that are also involved in her care, [name] fills her pill box weekly, and [name] is contact person  listed on [name], [name] reports that his sister [name] has filled her pill box according to her discharge instructions. [name] unable to review medications at this time but states she takes medication daily as in her box.  Patient was active with [facility] prior to admission ,placed call to [facility] to verify home health services to be resumed at discharge, per representative visits to begin today.  Plan: Patient will remain active in Transition of care program Patient will attend PCP appointment for 4/5 Home health services will remain active with patient RNCM will make sooner home visit home visit for 4/11.   [facility] [name] CM Care Plan Problem One        Most Recent Value   Care Plan Problem One  Recent [name] admission for GI bleed   Role Documenting the Problem One  Care Management Coordinator   Care Plan for Problem One  Active   [facility] Long Term Goal (31-90 days)  Patient will not experience a [name] admission in the next 31 days   [facility] Long Term Goal Start Date  11/24/15   Interventions for Problem One Long Term Goal  Introduced to transition of care program, provided my contact number as well as 24 nurse  line and main number, educated on importance of notiifying PCP of symptoms sooner , taking medications as prescribed   [facility] CM Short Term Goal #1 (0-30 days)  Patient will make and attend PCP appointment and GI follow in the next 14 days   [facility] CM Short Term Goal #1 Start Date  11/24/15   Interventions for Short Term Goal #1  Verified patient has PCP appointment scheduled, Discuss with family transportation to appointment, Discussed with patiient/family need for appointment with  GI MD, verified they will make follow up  appoitntment    [facility] CM Short Term Goal #2 (0-30 days)  Patient will resume home health services within the next 7 days   [facility] CM Short Term Goal #2 Start Date  11/24/15   Interventions for Short Term Goal #2  RNCM contacted [facility] to verify that patient is eligible to resume home services and verifiy  start date     Egbert GaribaldiKimberly Aspin Pal[place]arez, [place], [name] Care [facility] [phone]- Mobile [phone]- Toll Free Main Office

## 2015-11-24 NOTE — Progress Notes (Signed)
This encounter was created in error - please disregard.

## 2015-11-25 ENCOUNTER — Emergency Department: Payer: Medicare Other

## 2015-11-25 ENCOUNTER — Encounter: Payer: Self-pay | Admitting: Internal Medicine

## 2015-11-25 ENCOUNTER — Observation Stay
Admission: EM | Admit: 2015-11-25 | Discharge: 2015-11-27 | Disposition: A | Discharge status: Home / Self Care | Payer: Medicare Other | Attending: Internal Medicine | Admitting: Internal Medicine

## 2015-11-25 ENCOUNTER — Ambulatory Visit (INDEPENDENT_AMBULATORY_CARE_PROVIDER_SITE_OTHER): Payer: Medicare Other | Admitting: Internal Medicine

## 2015-11-25 VITALS — BP 123/72 | HR 68 | Temp 98.2°F

## 2015-11-25 DIAGNOSIS — M1990 Unspecified osteoarthritis, unspecified site: Secondary | ICD-10-CM | POA: Diagnosis not present

## 2015-11-25 DIAGNOSIS — M1712 Unilateral primary osteoarthritis, left knee: Secondary | ICD-10-CM | POA: Insufficient documentation

## 2015-11-25 DIAGNOSIS — Z9049 Acquired absence of other specified parts of digestive tract: Secondary | ICD-10-CM | POA: Diagnosis not present

## 2015-11-25 DIAGNOSIS — K922 Gastrointestinal hemorrhage, unspecified: Secondary | ICD-10-CM | POA: Insufficient documentation

## 2015-11-25 DIAGNOSIS — R748 Abnormal levels of other serum enzymes: Secondary | ICD-10-CM | POA: Diagnosis not present

## 2015-11-25 DIAGNOSIS — Z888 Allergy status to other drugs, medicaments and biological substances status: Secondary | ICD-10-CM | POA: Insufficient documentation

## 2015-11-25 DIAGNOSIS — Z91048 Other nonmedicinal substance allergy status: Secondary | ICD-10-CM | POA: Insufficient documentation

## 2015-11-25 DIAGNOSIS — G2581 Restless legs syndrome: Secondary | ICD-10-CM | POA: Diagnosis not present

## 2015-11-25 DIAGNOSIS — Z79891 Long term (current) use of opiate analgesic: Secondary | ICD-10-CM | POA: Diagnosis not present

## 2015-11-25 DIAGNOSIS — K219 Gastro-esophageal reflux disease without esophagitis: Secondary | ICD-10-CM | POA: Diagnosis not present

## 2015-11-25 DIAGNOSIS — I2510 Atherosclerotic heart disease of native coronary artery without angina pectoris: Secondary | ICD-10-CM | POA: Insufficient documentation

## 2015-11-25 DIAGNOSIS — D72829 Elevated white blood cell count, unspecified: Secondary | ICD-10-CM | POA: Diagnosis not present

## 2015-11-25 DIAGNOSIS — Z9889 Other specified postprocedural states: Secondary | ICD-10-CM | POA: Diagnosis not present

## 2015-11-25 DIAGNOSIS — R05 Cough: Secondary | ICD-10-CM | POA: Diagnosis not present

## 2015-11-25 DIAGNOSIS — Z7902 Long term (current) use of antithrombotics/antiplatelets: Secondary | ICD-10-CM | POA: Diagnosis not present

## 2015-11-25 DIAGNOSIS — G20 Parkinson's disease: Secondary | ICD-10-CM | POA: Diagnosis not present

## 2015-11-25 DIAGNOSIS — J9601 Acute respiratory failure with hypoxia: Secondary | ICD-10-CM | POA: Diagnosis not present

## 2015-11-25 DIAGNOSIS — F329 Major depressive disorder, single episode, unspecified: Secondary | ICD-10-CM | POA: Diagnosis not present

## 2015-11-25 DIAGNOSIS — R0602 Shortness of breath: Secondary | ICD-10-CM | POA: Insufficient documentation

## 2015-11-25 DIAGNOSIS — E039 Hypothyroidism, unspecified: Secondary | ICD-10-CM | POA: Insufficient documentation

## 2015-11-25 DIAGNOSIS — I700 Atherosclerosis of aorta: Secondary | ICD-10-CM | POA: Insufficient documentation

## 2015-11-25 DIAGNOSIS — Z794 Long term (current) use of insulin: Secondary | ICD-10-CM | POA: Diagnosis not present

## 2015-11-25 DIAGNOSIS — R7989 Other specified abnormal findings of blood chemistry: Secondary | ICD-10-CM | POA: Diagnosis present

## 2015-11-25 DIAGNOSIS — Z8249 Family history of ischemic heart disease and other diseases of the circulatory system: Secondary | ICD-10-CM | POA: Insufficient documentation

## 2015-11-25 DIAGNOSIS — R778 Other specified abnormalities of plasma proteins: Secondary | ICD-10-CM

## 2015-11-25 DIAGNOSIS — I5032 Chronic diastolic (congestive) heart failure: Secondary | ICD-10-CM | POA: Diagnosis not present

## 2015-11-25 DIAGNOSIS — E119 Type 2 diabetes mellitus without complications: Secondary | ICD-10-CM | POA: Diagnosis not present

## 2015-11-25 DIAGNOSIS — J449 Chronic obstructive pulmonary disease, unspecified: Secondary | ICD-10-CM | POA: Diagnosis present

## 2015-11-25 DIAGNOSIS — F419 Anxiety disorder, unspecified: Secondary | ICD-10-CM | POA: Diagnosis not present

## 2015-11-25 DIAGNOSIS — Z90710 Acquired absence of both cervix and uterus: Secondary | ICD-10-CM | POA: Diagnosis not present

## 2015-11-25 DIAGNOSIS — M810 Age-related osteoporosis without current pathological fracture: Secondary | ICD-10-CM | POA: Diagnosis not present

## 2015-11-25 DIAGNOSIS — J441 Chronic obstructive pulmonary disease with (acute) exacerbation: Secondary | ICD-10-CM | POA: Diagnosis not present

## 2015-11-25 DIAGNOSIS — R531 Weakness: Secondary | ICD-10-CM | POA: Insufficient documentation

## 2015-11-25 DIAGNOSIS — Z8673 Personal history of transient ischemic attack (TIA), and cerebral infarction without residual deficits: Secondary | ICD-10-CM | POA: Diagnosis not present

## 2015-11-25 DIAGNOSIS — I10 Essential (primary) hypertension: Secondary | ICD-10-CM | POA: Insufficient documentation

## 2015-11-25 DIAGNOSIS — E785 Hyperlipidemia, unspecified: Secondary | ICD-10-CM | POA: Diagnosis not present

## 2015-11-25 DIAGNOSIS — Z79899 Other long term (current) drug therapy: Secondary | ICD-10-CM | POA: Insufficient documentation

## 2015-11-25 DIAGNOSIS — E876 Hypokalemia: Secondary | ICD-10-CM | POA: Insufficient documentation

## 2015-11-25 LAB — BASIC METABOLIC PANEL
ANION GAP: 13 (ref 5–15)
BUN: 29 mg/dL — AB (ref 6–20)
CALCIUM: 8.8 mg/dL — AB (ref 8.9–10.3)
CO2: 34 mmol/L — ABNORMAL HIGH (ref 22–32)
Chloride: 94 mmol/L — ABNORMAL LOW (ref 101–111)
Creatinine, Ser: 1.21 mg/dL — ABNORMAL HIGH (ref 0.44–1.00)
GFR calc Af Amer: 46 mL/min — ABNORMAL LOW (ref >60)
GFR calc non Af Amer: 40 mL/min — ABNORMAL LOW (ref >60)
GLUCOSE: 242 mg/dL — AB (ref 65–99)
Potassium: 3.1 mmol/L — ABNORMAL LOW (ref 3.5–5.1)
SODIUM: 141 mmol/L (ref 135–145)

## 2015-11-25 LAB — CBC WITH DIFFERENTIAL/PLATELET
BASOS ABS: 0 10*3/uL (ref 0–0.1)
Basophils Relative: 0 %
EOS ABS: 0 10*3/uL (ref 0–0.7)
EOS PCT: 0 %
HCT: 45.1 % (ref 35.0–47.0)
Hemoglobin: 14.9 g/dL (ref 12.0–16.0)
LYMPHS PCT: 6 %
Lymphs Abs: 1 10*3/uL (ref 1.0–3.6)
MCH: 30.3 pg (ref 26.0–34.0)
MCHC: 33.1 g/dL (ref 32.0–36.0)
MCV: 91.4 fL (ref 80.0–100.0)
MONO ABS: 0.4 10*3/uL (ref 0.2–0.9)
Monocytes Relative: 2 %
Neutro Abs: 15.6 10*3/uL — ABNORMAL HIGH (ref 1.4–6.5)
Neutrophils Relative %: 92 %
PLATELETS: 173 10*3/uL (ref 150–440)
RBC: 4.94 MIL/uL (ref 3.80–5.20)
RDW: 14.3 % (ref 11.5–14.5)
WBC: 17 10*3/uL — AB (ref 3.6–11.0)

## 2015-11-25 LAB — GLUCOSE, CAPILLARY
GLUCOSE-CAPILLARY: 282 mg/dL — AB (ref 65–99)
Glucose-Capillary: 402 mg/dL — ABNORMAL HIGH (ref 65–99)

## 2015-11-25 LAB — BRAIN NATRIURETIC PEPTIDE: B Natriuretic Peptide: 203 pg/mL — ABNORMAL HIGH (ref 0.0–100.0)

## 2015-11-25 LAB — TROPONIN I
TROPONIN I: 0.04 ng/mL — AB (ref <0.031)
TROPONIN I: 0.18 ng/mL — AB (ref <0.031)
Troponin I: 0.04 ng/mL — ABNORMAL HIGH (ref <0.031)

## 2015-11-25 MED ORDER — INSULIN ASPART 100 UNIT/ML ~~LOC~~ SOLN
0.0000 [IU] | Freq: Every day | SUBCUTANEOUS | Status: DC
  Administered 2015-11-25: 5 [IU] via SUBCUTANEOUS
  Administered 2015-11-26: 2 [IU] via SUBCUTANEOUS
  Filled 2015-11-25: qty 5

## 2015-11-25 MED ORDER — PRAVASTATIN SODIUM 20 MG PO TABS
40.0000 mg | ORAL_TABLET | Freq: Every day | ORAL | Status: DC
  Administered 2015-11-25: 40 mg via ORAL
  Filled 2015-11-25: qty 2

## 2015-11-25 MED ORDER — CLOPIDOGREL BISULFATE 75 MG PO TABS: 75.0000 mg | ORAL_TABLET | Freq: Every day | ORAL | Status: DC

## 2015-11-25 MED ORDER — HYDROCODONE-ACETAMINOPHEN 5-325 MG PO TABS
1.0000 | ORAL_TABLET | Freq: Three times a day (TID) | ORAL | Status: DC | PRN
  Administered 2015-11-25: 1 via ORAL
  Filled 2015-11-25: qty 1

## 2015-11-25 MED ORDER — INSULIN ASPART 100 UNIT/ML ~~LOC~~ SOLN
0.0000 [IU] | Freq: Three times a day (TID) | SUBCUTANEOUS | Status: DC
Start: 2015-11-25 — End: 2015-11-27
  Administered 2015-11-25 – 2015-11-26 (×4): 5 [IU] via SUBCUTANEOUS
  Administered 2015-11-27: 2 [IU] via SUBCUTANEOUS
  Administered 2015-11-27: 3 [IU] via SUBCUTANEOUS
  Filled 2015-11-25 (×2): qty 5
  Filled 2015-11-25: qty 2
  Filled 2015-11-25: qty 3
  Filled 2015-11-25: qty 5
  Filled 2015-11-25: qty 2
  Filled 2015-11-25: qty 5

## 2015-11-25 MED ORDER — PANTOPRAZOLE SODIUM 40 MG PO TBEC
40.0000 mg | DELAYED_RELEASE_TABLET | Freq: Every day | ORAL | Status: DC
  Administered 2015-11-25 – 2015-11-27 (×3): 40 mg via ORAL
  Filled 2015-11-25 (×3): qty 1

## 2015-11-25 MED ORDER — BUDESONIDE 0.25 MG/2ML IN SUSP
0.2500 mg | Freq: Two times a day (BID) | RESPIRATORY_TRACT | Status: DC
  Administered 2015-11-25 – 2015-11-27 (×4): 0.25 mg via RESPIRATORY_TRACT
  Filled 2015-11-25 (×4): qty 2

## 2015-11-25 MED ORDER — IPRATROPIUM-ALBUTEROL 0.5-2.5 (3) MG/3ML IN SOLN
3.0000 mL | Freq: Once | RESPIRATORY_TRACT | Status: AC
  Administered 2015-11-25: 3 mL via RESPIRATORY_TRACT
  Filled 2015-11-25: qty 3

## 2015-11-25 MED ORDER — LEVOTHYROXINE SODIUM 75 MCG PO TABS
75.0000 ug | ORAL_TABLET | Freq: Every day | ORAL | Status: DC
  Administered 2015-11-26 – 2015-11-27 (×2): 75 ug via ORAL
  Filled 2015-11-25 (×2): qty 1

## 2015-11-25 MED ORDER — METHYLPREDNISOLONE SODIUM SUCC 125 MG IJ SOLR
60.0000 mg | Freq: Three times a day (TID) | INTRAMUSCULAR | Status: DC
  Administered 2015-11-25 – 2015-11-26 (×3): 60 mg via INTRAVENOUS
  Filled 2015-11-25 (×3): qty 2

## 2015-11-25 MED ORDER — CARBIDOPA-LEVODOPA 25-100 MG PO TABS
1.0000 | ORAL_TABLET | Freq: Three times a day (TID) | ORAL | Status: DC
  Administered 2015-11-25 – 2015-11-27 (×6): 1 via ORAL
  Filled 2015-11-25 (×6): qty 1

## 2015-11-25 MED ORDER — INSULIN GLARGINE 100 UNIT/ML ~~LOC~~ SOLN
70.0000 [IU] | Freq: Every day | SUBCUTANEOUS | Status: DC
Start: 2015-11-25 — End: 2015-11-27
  Administered 2015-11-25 – 2015-11-26 (×2): 70 [IU] via SUBCUTANEOUS
  Filled 2015-11-25 (×3): qty 0.7

## 2015-11-25 MED ORDER — AMLODIPINE BESYLATE 5 MG PO TABS
5.0000 mg | ORAL_TABLET | Freq: Every day | ORAL | Status: DC
  Administered 2015-11-26 – 2015-11-27 (×2): 5 mg via ORAL
  Filled 2015-11-25 (×3): qty 1

## 2015-11-25 MED ORDER — PANCRELIPASE (LIP-PROT-AMYL) 12000-38000 UNITS PO CPEP
12000.0000 [IU] | ORAL_CAPSULE | Freq: Three times a day (TID) | ORAL | Status: DC
  Administered 2015-11-25 – 2015-11-27 (×6): 12000 [IU] via ORAL
  Filled 2015-11-25 (×8): qty 1

## 2015-11-25 MED ORDER — BUDESONIDE 3 MG PO CPEP
9.0000 mg | ORAL_CAPSULE | Freq: Every day | ORAL | Status: DC
  Administered 2015-11-26 – 2015-11-27 (×2): 9 mg via ORAL
  Filled 2015-11-25 (×3): qty 3

## 2015-11-25 MED ORDER — ACETAMINOPHEN 650 MG RE SUPP: 650.0000 mg | Freq: Four times a day (QID) | RECTAL | Status: DC | PRN

## 2015-11-25 MED ORDER — ALPRAZOLAM 0.5 MG PO TABS
0.5000 mg | ORAL_TABLET | Freq: Three times a day (TID) | ORAL | Status: DC
  Administered 2015-11-25 – 2015-11-27 (×6): 0.5 mg via ORAL
  Filled 2015-11-25 (×6): qty 1

## 2015-11-25 MED ORDER — ACETAMINOPHEN 325 MG PO TABS: 650.0000 mg | ORAL_TABLET | Freq: Four times a day (QID) | ORAL | Status: DC | PRN

## 2015-11-25 MED ORDER — BUSPIRONE HCL 10 MG PO TABS
10.0000 mg | ORAL_TABLET | Freq: Two times a day (BID) | ORAL | Status: DC
  Administered 2015-11-25 – 2015-11-27 (×5): 10 mg via ORAL
  Filled 2015-11-25 (×5): qty 1

## 2015-11-25 MED ORDER — IPRATROPIUM-ALBUTEROL 0.5-2.5 (3) MG/3ML IN SOLN
3.0000 mL | Freq: Four times a day (QID) | RESPIRATORY_TRACT | Status: DC
  Administered 2015-11-25 – 2015-11-27 (×8): 3 mL via RESPIRATORY_TRACT
  Filled 2015-11-25 (×8): qty 3

## 2015-11-25 MED ORDER — ADULT MULTIVITAMIN W/MINERALS CH
1.0000 | ORAL_TABLET | Freq: Every day | ORAL | Status: DC
  Administered 2015-11-25 – 2015-11-27 (×3): 1 via ORAL
  Filled 2015-11-25 (×3): qty 1

## 2015-11-25 MED ORDER — POTASSIUM CHLORIDE CRYS ER 20 MEQ PO TBCR
40.0000 meq | EXTENDED_RELEASE_TABLET | Freq: Once | ORAL | Status: AC
  Administered 2015-11-25: 40 meq via ORAL
  Filled 2015-11-25: qty 2

## 2015-11-25 MED ORDER — LORATADINE 10 MG PO TABS
10.0000 mg | ORAL_TABLET | Freq: Every day | ORAL | Status: DC
  Administered 2015-11-25 – 2015-11-27 (×3): 10 mg via ORAL
  Filled 2015-11-25 (×3): qty 1

## 2015-11-25 MED ORDER — FUROSEMIDE 40 MG PO TABS
40.0000 mg | ORAL_TABLET | Freq: Every day | ORAL | Status: DC
  Administered 2015-11-25 – 2015-11-27 (×3): 40 mg via ORAL
  Filled 2015-11-25 (×3): qty 1

## 2015-11-25 MED ORDER — DIPHENHYDRAMINE HCL 25 MG PO CAPS
25.0000 mg | ORAL_CAPSULE | Freq: Four times a day (QID) | ORAL | Status: DC | PRN
  Administered 2015-11-25: 25 mg via ORAL
  Filled 2015-11-25: qty 1

## 2015-11-25 MED ORDER — RISAQUAD PO CAPS
1.0000 | ORAL_CAPSULE | Freq: Every day | ORAL | Status: DC
  Administered 2015-11-26 – 2015-11-27 (×2): 1 via ORAL
  Filled 2015-11-25 (×2): qty 1

## 2015-11-25 NOTE — ED Notes (Signed)
Per EMS patient comes from [place] [facility]. She was being seen for her follow up visit after [facility] for [facility]d exacerbation and bronchitis. EMS states patient was stating 93% on RA. Patient has to use her personal inhaler x2 today and EMS gave an albuterol tx. Patient is A&O x4.

## 2015-11-25 NOTE — Patient Instructions (Signed)
To ER via EMS 

## 2015-11-25 NOTE — ED Notes (Signed)
Admitting MD at bedside.

## 2015-11-25 NOTE — Progress Notes (Signed)
Subjective:    Patient ID: [name], female    DOB: 17-Nov-1928, 82 y.o.   MRN: [id number]  HPI  82YO female presents for hospital follow up.  ADMITTED: 11/18/2015 DISCHARGED: 11/21/2015  DIAGNOSIS: Acute GI bleeding, CHF, COPD with acute on chronic respiratory failure, hypokalemia  Admitted from ER after presenting with rectal bleeding and wheezing. Started on steroids, nebulizer, antibiotics. Discharged with Azithromycin and Prednisone. Seen by Dr. Shelle [name] from GI inpatient. No further bleeding noted. Plavix was held.  Continues to feel weak. Feeling more short of breath. Wearing oxygen at home. No fever at home. Coughing up some mucous. Daughters are with her today and are concerned. Difficulty getting her from car to wheelchair because of dyspnea. No chest pain. No further rectal bleeding.   Wt Readings from Last 3 Encounters:  11/18/15 189 lb (85.73 kg)  11/02/15 193 lb 9.6 oz (87.816 kg)  10/16/15 189 lb 8 oz (85.957 kg)   BP Readings from Last 3 Encounters:  11/25/15 123/72  11/21/15 132/69  11/02/15 132/86    Past Medical History  Diagnosis Date  . Anxiety   . Chronic diastolic CHF (congestive heart failure) (HCC)   . Depression   . Diabetes mellitus     Follow[facility] by Dr. Tedd [name] at [facility]  . GERD (gastroesophageal reflux disease)   . Hypertension   . Hypothyroidism   . Osteoporosis   . Allergy   . Hyperlipidemia   . RLS (restless legs syndrome)   . Pancreatic disease     pancreatic failure  . Urinary incontinence   . Pancreatitis     Pancreatic resection/ open wound- [facility] 11/04  . Pneumonia     Pneumonia/ Emphysema 05/05  . 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 TIA (transient ischemic attack)     recurrent 12/15  . History of pleural empyema   . Sepsis (HCC)   . Frequent falls   . Arthritis     left knee, [facility]  . CAD (coronary artery disease), native coronary artery      1//15  T mid-LAD  stent  OM2  . Parkinson's disease (HCC)    Family History  Problem Relation Age of Onset  . Heart disease Mother   . Heart attack Mother   . Hypertension Mother   . Heart attack Father    Past Surgical History  Procedure Laterality Date  . Breast lumpectomy      Lumpectomy right breast  . 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 Abdominal hysterectomy      Hysteroscopy/ D & C ([facility]) 12/05  . Removal of pancreas  2004  . Kyphoplasty  2005  . Broken shoulder and orbital bone  2005  . Cholecystectomy  1973  . Thoracentesis    . Vaginal delivery      9  . Cardiac catheterization  08/29/2013    x1 stent @ [facility]  . Cataract surgery     Social History   Social History  . Marital Status: Widowed    Spouse Name: N/A  . Number of Children: 7  . Years of Education: 6   Occupational History  . retired- Veterinary surgeon    Social History Main Topics  . Smoking status: Never Smoker   . Smokeless tobacco: Never Used  . Alcohol Use: No  . Drug Use: No  . Sexual Activity: Not Currently  Birth Control/ Protection: None   Other Topics Concern  . None   Social History Narrative   Lives in home alone, has nursing aid, and alert device in [place]. Has 7 children.            Has [name], daughter, as health care POA   Not sure about DNR   Permission to speak to daughter-in-law, [name], regarding health care matters.    Review of Systems  Constitutional: Positive for fatigue. Negative for fever, chills and unexpected weight change.  HENT: Negative for congestion, ear discharge, ear pain, facial swelling, hearing loss, mouth sores, nosebleeds, postnasal drip, rhinorrhea, sinus pressure, sneezing, sore throat, tinnitus, trouble swallowing and voice change.   Eyes: Negative for pain, discharge, redness and visual disturbance.  Respiratory: Positive for cough, chest tightness, shortness of breath and wheezing. Negative for stridor.     Cardiovascular: Negative for chest pain, palpitations and leg swelling.  Musculoskeletal: Negative for myalgias, arthralgias, neck pain and neck stiffness.  Skin: Negative for color change and rash.  Neurological: Positive for weakness. Negative for dizziness, light-headedness and headaches.  Hematological: Negative for adenopathy.  Psychiatric/Behavioral: Positive for sleep disturbance.       Objective:    BP 123/72 mmHg  Pulse 68  Temp(Src) 98.2 F (36.8 C) (Oral)  Wt   SpO2 91%  LMP  (LMP Unknown) Physical Exam  Constitutional: She is oriented to person, place, and time. She appears well-developed and well-nourished. She appears distressed.  HENT:  Head: Normocephalic and atraumatic.  Right Ear: External ear normal.  Left Ear: External ear normal.  Nose: Nose normal.  Mouth/Throat: Oropharynx is clear and moist. No oropharyngeal exudate.  Eyes: Conjunctivae are normal. Pupils are equal, round, and reactive to light. Right eye exhibits no discharge. Left eye exhibits no discharge. No scleral icterus.  Neck: Normal range of motion. Neck supple. No tracheal deviation present. No thyromegaly present.  Cardiovascular: Normal rate, regular rhythm, normal heart sounds and intact distal pulses.  Exam reveals no gallop and no friction rub.   No murmur heard. Pulmonary/Chest: Accessory muscle usage present. Tachypnea noted. She is in respiratory distress. She has decreased breath sounds. She has wheezes. She has rhonchi. She has no rales. She exhibits no tenderness.  Musculoskeletal: Normal range of motion. She exhibits no edema or tenderness.  Lymphadenopathy:    She has no cervical adenopathy.  Neurological: She is alert and oriented to person, place, and time. No cranial nerve deficit. She exhibits normal muscle tone. Coordination normal.  Skin: Skin is warm and dry. No rash noted. She is not diaphoretic. No erythema. No pallor.  Psychiatric: She has a normal mood and affect. Her  behavior is normal. Judgment and thought content normal.          Assessment & Plan:   Problem List Items Addressed This Visit      Unprioritized   Acute respiratory failure with hypoxia (HCC) - Primary    Acute respiratory failure with tachypnea and hypoxia. Sat improved to 92-94% on 3-4 L oxygen by [place]. EMS called for transport to ER for further evaluation. Failed outpatient treatment with prednisone 50mg  daily and Azithromycin.      COPD exacerbation (HCC)   GIB (gastrointestinal bleeding)    No further GI bleeding since discharge. Will continue to hold Plavix and aspirin until acute issues with respiratory distress resolved.          No Follow-up on file.  Ronna PolioJennifer Walker, MD Internal Medicine [facility]

## 2015-11-25 NOTE — Assessment & Plan Note (Signed)
Acute respiratory failure with tachypnea and hypoxia. Sat improved to 92-94% on 3-4 L oxygen by [place]. EMS called for transport to ER for further evaluation. Failed outpatient treatment with prednisone 50mg  daily and Azithromycin.

## 2015-11-25 NOTE — ED Notes (Signed)
SpO2 93%. Patient placed on 2L O2 [place] for comfort. SpO2 96%.

## 2015-11-25 NOTE — ED Provider Notes (Signed)
[facility] Emergency Department Provider Note     Time seen: ----------------------------------------- 12:10 PM on 11/25/2015 -----------------------------------------    I have reviewed the triage vital signs and the nursing notes.   HISTORY  Chief Complaint Shortness of Breath    HPI [name] is a 82 y.o. female who presents to ER for shortness of breath. According to EMS she comes from with our primary care. She was being seen following up after being discharged from [facility] on the first of this month for COPD exacerbation and bronchitis. EMS states oxygen saturations were 93% on room air. She used her inhaler twice today. She currently has not felt any better since being discharged from the hospital.   Past Medical History  Diagnosis Date  . Anxiety   . Chronic diastolic CHF (congestive heart failure) (HCC)   . Depression   . Diabetes mellitus     Followed by Dr. Tedd Sias at [facility]  . GERD (gastroesophageal reflux disease)   . Hypertension   . Hypothyroidism   . Osteoporosis   . Allergy   . Hyperlipidemia   . RLS (restless legs syndrome)   . Pancreatic disease     pancreatic failure  . Urinary incontinence   . Pancreatitis     Pancreatic resection/ open wound- [facility] 11/04  . Pneumonia     Pneumonia/ Emphysema 05/05  . 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 TIA (transient ischemic attack)     recurrent 12/15  . History of pleural empyema   . Sepsis (HCC)   . Frequent falls   . Arthritis     left knee, [facility]  . CAD (coronary artery disease), native coronary artery      1//15  T mid-LAD  stent OM2  . Parkinson's disease [facility])     Patient Active Problem List   Diagnosis Date Noted  . Acute respiratory failure with hypoxia (HCC) 11/25/2015  . COPD exacerbation (HCC) 11/18/2015  . Diarrhea 11/02/2015  . Ecchymosis 11/02/2015  . Protein-calorie malnutrition (HCC)  09/17/2015  . Failure to thrive in adult 09/14/2015  . C. difficile colitis 09/14/2015  . Drug eruption 09/14/2015  . Thrombocytopenia (HCC) 09/14/2015  . Hypothermia 09/14/2015  . Generalized weakness 09/14/2015  . Hypoglycemia 09/11/2015  . Right shoulder pain 06/01/2015  . Right arm pain 05/11/2015  . Insomnia 03/17/2015  . GIB (gastrointestinal bleeding) 03/12/2015  . Chronic fatigue 03/10/2015  . Paroxysmal atrial fibrillation (HCC) 03/02/2015  . CKD (chronic kidney disease), stage III 02/12/2015  . Frequent falls 02/12/2015  . Anxiety 02/12/2015  . H/O resection of pancreas 02/12/2015  . Dysphagia, pharyngoesophageal phase 01/23/2015  . Memory loss 10/23/2014  . CVA (cerebral infarction) 08/07/2014  . Left rotator cuff tear 08/07/2014  . Coronary artery disease 09/12/2013  . Dyspnea 06/20/2013  . Tremor 06/07/2012  . Osteoarthrosis, unspecified whether generalized or localized, involving lower leg 02/27/2012  . Open abdominal wall wound 04/12/2010  . Hyperlipidemia 10/27/2008  . Hypothyroidism 03/07/2007  . Diabetes type 2, controlled (HCC) 03/07/2007  . DEPRESSION 03/07/2007  . Essential hypertension 03/07/2007  . FAILURE, DIASTOLIC HEART, CHRONIC 03/07/2007  . GERD 03/07/2007    Past Surgical History  Procedure Laterality Date  . Breast lumpectomy      Lumpectomy right breast  . 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 Abdominal hysterectomy      Hysteroscopy/ D &  C ([facility]) 12/05  . Removal of pancreas  2004  . Kyphoplasty  2005  . Broken shoulder and orbital bone  2005  . Cholecystectomy  1973  . Thoracentesis    . Vaginal delivery      7  . Cardiac catheterization  08/29/2013    x1 stent @ [facility]  . Cataract surgery      Allergies Citalopram hydrobromide; Diphenhydramine; Metoclopramide; Paroxetine; and Tape  Social History Social History  Substance Use Topics  . Smoking status: Never Smoker   . Smokeless  tobacco: Never Used  . Alcohol Use: No    Review of Systems Constitutional: Negative for fever. Eyes: Negative for visual changes. ENT: Negative for sore throat. Cardiovascular: Negative for chest pain. Respiratory: Positive for shortness of breath and cough Gastrointestinal: Negative for abdominal pain, vomiting and diarrhea. Genitourinary: Negative for dysuria. Musculoskeletal: Negative for back pain. Skin: Negative for rash. Neurological: Negative for headaches, positive for weakness  10-point ROS otherwise negative.  ____________________________________________   PHYSICAL EXAM:  VITAL SIGNS: ED Triage Vitals  Enc Vitals Group     BP 11/25/15 1204 147/79 mmHg     Pulse Rate 11/25/15 1153 68     Resp 11/25/15 1153 20     Temp 11/25/15 1153 97.6 F (36.4 C)     Temp Source 11/25/15 1153 Axillary     SpO2 11/25/15 1149 93 %     Weight 11/25/15 1153 198 lb (89.812 kg)     Height 11/25/15 1153 4\' 11"  (1.499 m)     Head Cir --      Peak Flow --      Pain Score 11/25/15 1154 0     Pain Loc --      Pain Edu? --      Excl. in GC? --     Constitutional: Alert and oriented. No acute distress Eyes: Conjunctivae are normal. PERRL. Normal extraocular movements. ENT   Head: Normocephalic and atraumatic.   Nose: No congestion/rhinnorhea.   Mouth/Throat: Mucous membranes are moist.   Neck: No stridor. Cardiovascular: Normal rate, regular rhythm. Normal and symmetric distal pulses are present in all extremities. No murmurs, rubs, or gallops. Respiratory: Normal respiratory effort with rhonchi on the right greater than left Gastrointestinal: Soft and nontender. No distention. No abdominal bruits.  Musculoskeletal: Nontender with normal range of motion in all extremities. No joint effusions.  No lower extremity tenderness  Neurologic:  Normal speech and language. No gross focal neurologic deficits are appreciated.  Skin:  Skin is warm, dry and intact. No rash  noted. Psychiatric: Mood and affect are normal. Speech and behavior are normal. Patient exhibits appropriate insight and judgment. ____________________________________________  EKG: Interpreted by me. Sinus rhythm with rate of 69 bpm, normal PR interval, normal QRS, normal QT interval. Normal axis. T-wave inversions laterally  ____________________________________________  ED COURSE:  Pertinent labs & imaging results that were available during my care of the patient were reviewed by me and considered in my medical decision making (see chart for details). Patient with persistent respiratory symptoms, I will recheck her labs and x-rays. ____________________________________________    LABS (pertinent positives/negatives)  Labs Reviewed  CBC WITH DIFFERENTIAL/PLATELET - Abnormal; Notable for the following:    WBC 17.0 (*)    Neutro Abs 15.6 (*)    All other components within normal limits  BASIC METABOLIC PANEL - Abnormal; Notable for the following:    Potassium 3.1 (*)    Chloride 94 (*)    CO2 34 (*)  Glucose, Bld 242 (*)    BUN 29 (*)    Creatinine, Ser 1.21 (*)    Calcium 8.8 (*)    GFR calc non Af Amer 40 (*)    GFR calc Af Amer 46 (*)    All other components within normal limits  BRAIN NATRIURETIC PEPTIDE - Abnormal; Notable for the following:    B Natriuretic Peptide 203.0 (*)    All other components within normal limits  TROPONIN I - Abnormal; Notable for the following:    Troponin I 0.04 (*)    All other components within normal limits    RADIOLOGY Images were viewed by me  Chest x-ray IMPRESSION: No acute abnormality. Aortic atherosclerosis. ____________________________________________  FINAL ASSESSMENT AND PLAN  Dyspnea, COPD, elevated troponin  Plan: Patient with labs and imaging as dictated above. Patient presents the ER having failed outpatient management for COPD with steroids, breathing treatments and Zithromax. Patient does not feel well enough to  go home, we'll recommend observation. She was given DuoNeb while in the ER. Chest x-ray is reassuring.   Emily Filbert, MD   Emily Filbert, MD 11/25/15 [phone]

## 2015-11-25 NOTE — Care Management (Addendum)
Patient just discharged from [facility] followed by [facility] RN, PT OT. She is on Chronic O2 at home also through [facility]. Medicare OBS will not cover SNF- patient is in OBServation. Patient is appropriate for COPD Gold and HRI. Barbara CowerJason with [facility] updated. List of private duty sitters left with patient. Daughter in law Riccardo Dubinngela Loyld [phone]. CAP services in place- aide daily 3 hr in AM 3 hr PM.

## 2015-11-25 NOTE — H&P (Signed)
[facility] [place] at [facility], [place]   PATIENT NAME: [name]    MR#:  [id number]  DATE OF BIRTH:  01-Apr-1929  DATE OF ADMISSION:  11/25/2015  PRIMARY CARE PHYSICIAN: Wynona Dove, MD   REQUESTING/REFERRING PHYSICIAN: Daryel November  CHIEF COMPLAINT:   Chief Complaint  Patient presents with  . Shortness of Breath    HISTORY OF PRESENT ILLNESS:  [name]  is a 82 y.o. female patient recently sent out of the hospital with COPD exacerbation coming back with similar symptoms and worsening over the last few days. Patient was sent in by her PMD today to the ER for further evaluation. The patient states that she's been having shortness of breath coughing and wheezing going on for the past week. She's also had a very poor appetite and feeling very weak and hardly able to walk.  PAST MEDICAL HISTORY:   Past Medical History  Diagnosis Date  . Anxiety   . Chronic diastolic CHF (congestive heart failure) (HCC)   . Depression   . Diabetes mellitus     Followed by Dr. Tedd Sias at [facility]  . GERD (gastroesophageal reflux disease)   . Hypertension   . Hypothyroidism   . Osteoporosis   . Allergy   . Hyperlipidemia   . RLS (restless legs syndrome)   . Pancreatic disease     pancreatic failure  . Urinary incontinence   . Pancreatitis     Pancreatic resection/ open wound- [facility] 11/04  . Pneumonia     Pneumonia/ Emphysema 05/05  . 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 TIA (transient ischemic attack)     recurrent 12/15  . History of pleural empyema   . Sepsis (HCC)   . Frequent falls   . Arthritis     left knee, [facility]  . CAD (coronary artery disease), native coronary artery      1//15  T mid-LAD  stent OM2  . Parkinson's disease (HCC)     PAST SURGICAL HISTORY:   Past Surgical History  Procedure Laterality Date  . Breast lumpectomy      Lumpectomy right breast  .  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 Abdominal hysterectomy      Hysteroscopy/ D & C ([place]) 12/05  . Removal of pancreas  2004  . Kyphoplasty  2005  . Broken shoulder and orbital bone  2005  . Cholecystectomy  1973  . Thoracentesis    . Vaginal delivery      7  . Cardiac catheterization  08/29/2013    x1 stent @ [facility]  . Cataract surgery      SOCIAL HISTORY:   Social History  Substance Use Topics  . Smoking status: Never Smoker   . Smokeless tobacco: Never Used  . Alcohol Use: No    FAMILY HISTORY:   Family History  Problem Relation Age of Onset  . Heart disease Mother   . Heart attack Mother   . Hypertension Mother   . Heart attack Father     DRUG ALLERGIES:   Allergies  Allergen Reactions  . Citalopram Nausea And Vomiting and Other (See Comments)    Reaction:  Dizziness and dry mouth   . Diphenhydramine Other (See Comments)    Reaction:  Unknown   . Metoclopramide Other (See Comments)    Reaction:  Unknown   . Paroxetine Other (See Comments)  Reaction:  Hallucinations   . Tape Rash    REVIEW OF SYSTEMS:  CONSTITUTIONAL: No fever, positive for fatigue and weakness. Positive for chills. Positive for weight gain. EYES: No blurred or double vision. Wears glasses EARS, NOSE, AND THROAT: No tinnitus or ear pain. No sore throat. Positive for runny nose and postnasal drip. Positive for decreased hearing RESPIRATORY: Positive for cough, shortness of breath, and wheezing. No hemoptysis.  CARDIOVASCULAR: No chest pain, orthopnea, edema.  GASTROINTESTINAL: No nausea, vomiting,  or abdominal pain. Last admission was for blood in the bowel movement. Diarrhea a lot. GENITOURINARY: No dysuria, hematuria.  ENDOCRINE: No polyuria, nocturia, positive for hypothyroidism HEMATOLOGY: No anemia, easy bruising or bleeding SKIN: No rash or lesion. MUSCULOSKELETAL: No joint pain or arthritis.   NEUROLOGIC: No tingling, numbness,  weakness.  PSYCHIATRY: No anxiety or depression.   MEDICATIONS AT HOME:   Prior to Admission medications   Medication Sig Start Date End Date Taking? Authorizing Provider  albuterol (PROVENTIL HFA;VENTOLIN HFA) 108 (90 BASE) MCG/ACT inhaler Inhale 2 puffs into the lungs every 6 (six) hours as needed for wheezing or shortness of breath. 08/03/15  Yes Shelia Media, MD  ALPRAZolam Prudy Feeler) 1 MG tablet Take 0.5-1 mg by mouth 3 (three) times daily. Pt takes one-half tablet in the morning, one-half tablet at lunch, and one tablet at bedtime.   Yes Historical Provider, MD  amLODipine (NORVASC) 5 MG tablet Take 1 tablet (5 mg total) by mouth daily. 07/07/15  Yes Iran Ouch, MD  azithromycin (ZITHROMAX) 250 MG tablet Take 1 tablet (250 mg total) by mouth daily. 11/21/15  Yes Srikar Sudini, MD  budesonide (ENTOCORT EC) 3 MG 24 hr capsule Take 9 mg by mouth daily.    Yes Historical Provider, MD  busPIRone (BUSPAR) 10 MG tablet Take 10 mg by mouth 2 (two) times daily.   Yes Historical Provider, MD  carbidopa-levodopa (SINEMET IR) 25-100 MG tablet Take 1 tablet by mouth 3 (three) times daily.   Yes Historical Provider, MD  cetirizine (ZYRTEC) 10 MG tablet Take 10 mg by mouth at bedtime as needed for allergies.    Yes Historical Provider, MD  clopidogrel (PLAVIX) 75 MG tablet Take 75 mg by mouth daily.   Yes Historical Provider, MD  cyanocobalamin (,VITAMIN B-12,) 1000 MCG/ML injection Inject 1 mL (1,000 mcg total) into the muscle every 30 (thirty) days. 06/01/15  Yes Shelia Media, MD  furosemide (LASIX) 40 MG tablet Take 1 tablet (40 mg total) by mouth daily. 12/02/14  Yes Iran Ouch, MD  HYDROcodone-acetaminophen (NORCO/VICODIN) 5-325 MG tablet Take 1-2 tablets by mouth 3 (three) times daily as needed for moderate pain. 11/02/15  Yes Shelia Media, MD  insulin glargine (LANTUS) 100 unit/mL SOPN Inject 70 Units into the skin at bedtime.   Yes Historical Provider, MD  insulin lispro  (HUMALOG) 100 UNIT/ML injection Inject 8-18 Units into the skin 3 (three) times daily with meals as needed for high blood sugar. Pt uses as needed per sliding scale.   Yes Historical Provider, MD  levothyroxine (SYNTHROID, LEVOTHROID) 75 MCG tablet Take 75 mcg by mouth daily before breakfast.    Yes Historical Provider, MD  Multiple Vitamin (MULTIVITAMIN WITH MINERALS) TABS tablet Take 1 tablet by mouth daily.   Yes Historical Provider, MD  omeprazole (PRILOSEC) 20 MG capsule Take 1 capsule (20 mg total) by mouth daily. 06/01/15  Yes Shelia Media, MD  Pancrelipase, Lip-Prot-Amyl, (ZENPEP) 15000 units CPEP Take 15,000 Units by mouth  3 (three) times daily with meals.    Yes Historical Provider, MD  pravastatin (PRAVACHOL) 40 MG tablet Take 40 mg by mouth at bedtime.    Yes Historical Provider, MD  predniSONE (DELTASONE) 50 MG tablet Take 1 tablet (50 mg total) by mouth daily with breakfast. 11/21/15  Yes Srikar Sudini, MD  saccharomyces boulardii (FLORASTOR) 250 MG capsule Take 250 mg by mouth daily.    Yes Historical Provider, MD      VITAL SIGNS:  Blood pressure 121/91, pulse 65, temperature 97.6 F (36.4 C), temperature source Axillary, resp. rate 20, height 4\' 11"  (1.499 m), weight 89.812 kg (198 lb), SpO2 95 %.  PHYSICAL EXAMINATION:  GENERAL:  82 y.o.-year-old patient lying in the bed with no acute distress.  EYES: Pupils equal, round, reactive to light and accommodation. No scleral icterus. Extraocular muscles intact.  HEENT: Head atraumatic, normocephalic. Oropharynx and nasopharynx clear.  NECK:  Supple, no jugular venous distention. No thyroid enlargement, no tenderness.  LUNGS: Decreased breath sounds bilaterally, positive transmitted upper airway wheezing, no rales,rhonchi or crepitation. No use of accessory muscles of respiration.  CARDIOVASCULAR: S1, S2 normal. No murmurs, rubs, or gallops.  ABDOMEN: Soft, nontender, nondistended. Bowel sounds present. No organomegaly or mass.   EXTREMITIES: Trace edema, no cyanosis, or clubbing.  NEUROLOGIC: Cranial nerves II through XII are intact. Sensation intact. Gait not checked.  PSYCHIATRIC: The patient is alert and oriented x 3.  SKIN: No rash, lesion, or ulcer.   LABORATORY PANEL:   CBC  Recent Labs Lab 11/25/15 1159  WBC 17.0*  HGB 14.9  HCT 45.1  PLT 173   ------------------------------------------------------------------------------------------------------------------  Chemistries   Recent Labs Lab 11/25/15 1159  NA 141  K 3.1*  CL 94*  CO2 34*  GLUCOSE 242*  BUN 29*  CREATININE 1.21*  CALCIUM 8.8*   ------------------------------------------------------------------------------------------------------------------  Cardiac Enzymes  Recent Labs Lab 11/25/15 1159  TROPONINI 0.04*   ------------------------------------------------------------------------------------------------------------------  RADIOLOGY:  Dg Chest Port 1 View  11/25/2015  CLINICAL DATA:  Increasing shortness of breath and cough. EXAM: PORTABLE CHEST 1 VIEW COMPARISON:  11/18/2015 and 09/11/2015 FINDINGS: Heart size and pulmonary vascularity are normal and the lungs are clear. No acute bone abnormality. Chronic rotator cuff disease at the left shoulder. Old compression fractures at the thoracolumbar junction treated with vertebroplasty. Calcification in the thoracic aorta. IMPRESSION: No acute abnormality.  Aortic atherosclerosis. Electronically Signed   By: Francene Boyers M.D.   On: 11/25/2015 12:42    EKG:   Sinus rhythm 69 bpm  IMPRESSION AND PLAN:   1. COPD exacerbation. Give Solu-Medrol IV and budesonide nebulizers along with albuterol nebulizers. Patient was recently on antibiotics will stop that at this point. Chest x-ray negative. Initially patient be admitted as an observation. 2. weakness will get physical therapy evaluation 3. Hypothyroidism unspecified continue levothyroxine 4. Diabetes type 2 uncomplicated.  Sugars will likely be high on steroids 5. Hypertension essential. Continue usual medications 6. Arthritis 7. Elevated troponin only borderline. Likely demand ischemia from COPD exacerbation 8. Recent GI bleed but hemoglobin was stable hold off on Lovenox at this point. Hold Plavix at this point. 9. Hypokalemia replace potassium 10. Leukocytosis likely secondary to steroids   All the records are reviewed and case discussed with ED provider. Management plans discussed with the patient, family and they are in agreement.  CODE STATUS: Full code  TOTAL TIME TAKING CARE OF THIS PATIENT: 50 minutes.    Alford Highland M.D on 11/25/2015 at 1:57 PM  Between 7am  to 6pm - Pager - [phone]  After 6pm call admission pager [phone]  [facility] Office  [phone]  CC: Primary care physician; Wynona DoveWALKER,[name] AZBELL, MD

## 2015-11-25 NOTE — Progress Notes (Signed)
Pre visit review using our clinic review tool, if applicable. No additional management support is needed unless otherwise documented below in the visit note. 

## 2015-11-25 NOTE — Assessment & Plan Note (Signed)
No further GI bleeding since discharge. Will continue to hold Plavix and aspirin until acute issues with respiratory distress resolved.

## 2015-11-26 DIAGNOSIS — J441 Chronic obstructive pulmonary disease with (acute) exacerbation: Secondary | ICD-10-CM | POA: Diagnosis not present

## 2015-11-26 LAB — GLUCOSE, CAPILLARY
GLUCOSE-CAPILLARY: 260 mg/dL — AB (ref 65–99)
GLUCOSE-CAPILLARY: 268 mg/dL — AB (ref 65–99)
GLUCOSE-CAPILLARY: 298 mg/dL — AB (ref 65–99)
Glucose-Capillary: 228 mg/dL — ABNORMAL HIGH (ref 65–99)

## 2015-11-26 LAB — BASIC METABOLIC PANEL
Anion gap: 9 (ref 5–15)
BUN: 29 mg/dL — AB (ref 6–20)
CHLORIDE: 97 mmol/L — AB (ref 101–111)
CO2: 34 mmol/L — ABNORMAL HIGH (ref 22–32)
CREATININE: 1.19 mg/dL — AB (ref 0.44–1.00)
Calcium: 8.7 mg/dL — ABNORMAL LOW (ref 8.9–10.3)
GFR, EST AFRICAN AMERICAN: 47 mL/min — AB (ref >60)
GFR, EST NON AFRICAN AMERICAN: 41 mL/min — AB (ref >60)
Glucose, Bld: 301 mg/dL — ABNORMAL HIGH (ref 65–99)
POTASSIUM: 3.8 mmol/L (ref 3.5–5.1)
SODIUM: 140 mmol/L (ref 135–145)

## 2015-11-26 LAB — CBC
HCT: 40.8 % (ref 35.0–47.0)
Hemoglobin: 13.8 g/dL (ref 12.0–16.0)
MCH: 31.8 pg (ref 26.0–34.0)
MCHC: 33.8 g/dL (ref 32.0–36.0)
MCV: 93.9 fL (ref 80.0–100.0)
PLATELETS: 127 10*3/uL — AB (ref 150–440)
RBC: 4.35 MIL/uL (ref 3.80–5.20)
RDW: 14.5 % (ref 11.5–14.5)
WBC: 12.9 10*3/uL — AB (ref 3.6–11.0)

## 2015-11-26 MED ORDER — METHYLPREDNISOLONE SODIUM SUCC 125 MG IJ SOLR
60.0000 mg | Freq: Two times a day (BID) | INTRAMUSCULAR | Status: DC
  Administered 2015-11-26 – 2015-11-27 (×2): 60 mg via INTRAVENOUS
  Filled 2015-11-26 (×2): qty 2

## 2015-11-26 MED ORDER — INSULIN ASPART 100 UNIT/ML ~~LOC~~ SOLN
8.0000 [IU] | Freq: Three times a day (TID) | SUBCUTANEOUS | Status: DC
  Administered 2015-11-26 – 2015-11-27 (×3): 8 [IU] via SUBCUTANEOUS
  Filled 2015-11-26 (×3): qty 8

## 2015-11-26 NOTE — Progress Notes (Signed)
I and O cath patient tolerated well specimen sent to lab.

## 2015-11-26 NOTE — Progress Notes (Signed)
Inpatient Diabetes Program Recommendations  AACE/ADA: New Consensus Statement on Inpatient Glycemic Control (2015)  Target Ranges:  Prepandial:   less than 140 mg/dL      Peak postprandial:   less than 180 mg/dL (1-2 hours)      Critically ill patients:  140 - 180 mg/dL   Review of Glycemic Control  Results for [name], [name] (MRN [id number]) as of 11/26/2015 10:03  Ref. Range 11/25/2015 16:47 11/25/2015 21:00 11/26/2015 07:43  Glucose-Capillary Latest Ref Range: 65-99 mg/dL 409282 (H) 811402 (H) 914260 (H)    Diabetes history: Type 2 Outpatient Diabetes medications: Lantus 70 units qhs, Novolog 8-18 units tid with meals Current orders for Inpatient glycemic control: Lantus 70 units qhs, Novolog 0-9 units tid, Novolog 0-5 units qhs  ** steroids q8h  Inpatient Diabetes Program Recommendations: At last admission, patient was taking Novolog correction 0-15 units tid and I had recommended 8 units tid with meals.  Consider starting Novolog 8 units tid with meals (hold if she eats less than 50%)- continue Novolog correction as ordered.   Susette RacerJulie Gael Delude, RN, BA, MHA, CDE Diabetes Coordinator Inpatient Diabetes Program  [phone] (Team Pager) [phone] [facility] Office) 11/26/2015 10:10 AM

## 2015-11-26 NOTE — Progress Notes (Addendum)
Patient ID: [name], female   DOB: [birth year], 82 y.o.   MRN: [id number] [facility] - [place] at [facility]   PATIENT NAME: [name]    MR#:  [id number]  DATE OF BIRTH:  [birth year]  SUBJECTIVE:   C/o weakness. Breathing appears ok REVIEW OF SYSTEMS:   Review of Systems  Constitutional: Negative for fever, chills and weight loss.  HENT: Negative for ear discharge, ear pain and nosebleeds.   Eyes: Negative for blurred vision, pain and discharge.  Respiratory: Positive for shortness of breath. Negative for sputum production, wheezing and stridor.   Cardiovascular: Negative for chest pain, palpitations, orthopnea and PND.  Gastrointestinal: Negative for nausea, vomiting, abdominal pain and diarrhea.  Genitourinary: Negative for urgency and frequency.  Musculoskeletal: Negative for back pain and joint pain.  Neurological: Positive for weakness. Negative for sensory change, speech change and focal weakness.  Psychiatric/Behavioral: Negative for depression and hallucinations. The patient is not nervous/anxious.   All other systems reviewed and are negative.  Tolerating Diet:yes Tolerating PT: HHPT  DRUG ALLERGIES:   Allergies  Allergen Reactions  . Citalopram Nausea And Vomiting and Other (See Comments)    Reaction:  Dizziness and dry mouth   . Diphenhydramine Other (See Comments)    Reaction:  Unknown   . Metoclopramide Other (See Comments)    Reaction:  Unknown   . Paroxetine Other (See Comments)    Reaction:  Hallucinations   . Tape Rash    VITALS:  Blood pressure 128/74, pulse 82, temperature 97.8 F (36.6 C), temperature source Oral, resp. rate 16, height  (1.499 m), weight 89.812 kg (198 lb), SpO2 92 %.  PHYSICAL EXAMINATION:   Physical Exam  GENERAL:  82 y.o.-year-old patient lying in the bed with no acute distress.  EYES: Pupils equal, round, reactive to light and accommodation. No scleral icterus. Extraocular muscles intact.   HEENT: Head atraumatic, normocephalic. Oropharynx and nasopharynx clear.  NECK:  Supple, no jugular venous distention. No thyroid enlargement, no tenderness.  LUNGS: Normal breath sounds bilaterally, no wheezing, rales, rhonchi. No use of accessory muscles of respiration.  CARDIOVASCULAR: S1, S2 normal. No murmurs, rubs, or gallops.  ABDOMEN: Soft, nontender, nondistended. Bowel sounds present. No organomegaly or mass.  EXTREMITIES: No cyanosis, clubbing or edema b/l.    NEUROLOGIC: Cranial nerves II through XII are intact. No focal Motor or sensory deficits b/l.   PSYCHIATRIC:  patient is alert and oriented x 3.  SKIN: No obvious rash, lesion, or ulcer.   LABORATORY PANEL:  CBC  Recent Labs Lab 11/26/15 0547  WBC 12.9*  HGB 13.8  HCT 40.8  PLT 127*    Chemistries   Recent Labs Lab 11/26/15 0547  NA 140  K 3.8  CL 97*  CO2 34*  GLUCOSE 301*  BUN 29*  CREATININE 1.19*  CALCIUM 8.7*   Cardiac Enzymes  Recent Labs Lab 11/25/15 2119  TROPONINI 0.04*   RADIOLOGY:  Dg Chest Port 1 View  11/25/2015  CLINICAL DATA:  Increasing shortness of breath and cough. EXAM: PORTABLE CHEST 1 VIEW COMPARISON:  11/18/2015 and 09/11/2015 FINDINGS: Heart size and pulmonary vascularity are normal and the lungs are clear. No acute bone abnormality. Chronic rotator cuff disease at the left shoulder. Old compression fractures at the thoracolumbar junction treated with vertebroplasty. Calcification in the thoracic aorta. IMPRESSION: No acute abnormality.  Aortic atherosclerosis. Electronically Signed   By: Francene Boyers M.D.   On: 11/25/2015 12:42   ASSESSMENT AND PLAN:  [name] is a 82 y.o. female patient recently sent out of the hospital with COPD exacerbation coming back with similar symptoms and worsening over the last few days. Patient was sent in by her PMD today to the ER for further evaluation. The patient states that she's been having shortness of breath coughing and wheezing going  on for the past week  1. COPD exacerbation. Give Solu-Medrol IV and budesonide nebulizers along with albuterol nebulizers. Patient was recently on antibiotics will stop that at this point. Chest x-ray negative. 2. weakness will get physical therapy evaluation -recommends HHPT 3. Hypothyroidism unspecified continue levothyroxine 4. Diabetes type 2 uncomplicated. Sugars will likely be high on steroids 5. Hypertension essential. Continue usual medications 6. Arthritis 7. Elevated troponin only borderline. Likely demand ischemia from COPD exacerbation 8. Recent GI bleed but hemoglobin was stable hold off on Lovenox at this point. Hold Plavix at this point. 9. Hypokalemia replace potassium 10. Leukocytosis likely secondary to steroids  Case discussed with Care Management/Social Worker. Management plans discussed with the patient, family and they are in agreement.  CODE STATUS: full  DVT Prophylaxis:scd/teds  TOTAL TIME TAKING CARE OF THIS PATIENT: 25 minutes.  >50% time spent on counselling and coordination of care  POSSIBLE D/C IN 1-2 DAYS, DEPENDING ON CLINICAL CONDITION.  Note: This dictation was prepared with Dragon dictation along with smaller phrase technology. Any transcriptional errors that result from this process are unintentional.  Taveon Enyeart M.D on 11/26/2015 at 4:15 PM  Between 7am to 6pm - Pager - [phone]  After 6pm go to [email or web address] - password [email or web address] [facility]  [facility]  Office  [phone]  CC: Primary care physician; Wynona DoveWALKER,[name] AZBELL, MD

## 2015-11-26 NOTE — Evaluation (Signed)
Physical Therapy Evaluation Patient Details Name: [name] MRN: [id number] DOB: May 05, 1929 Today's Date: 11/26/2015   History of Present Illness  [name] is a 82 y.o. female with a known history of Chronic diastolic CHF, diabetes, GERD, hypertension, hypothyroidism, osteoporosis, restless leg syndrome, urinary incontinence, anxiety, history of osteoarthritis who presents with cough, SOB and weakness. Patient presented to the emergency room and was noted to be in COPD exacerbation and hospitalist services were contacted further treatment and evaluation. Pt reports 5-6 falls over the last 12 months. Most recent fall was "4-5 weeks ago."  Pt reported that she had received home health PT a few weeks ago.       Clinical Impression  Prior to admission pt was independent with rollator.  Pt lives alone; however, per pt and pt's daughter, pt has two home health aids that assist 7 days per week from 03-1229, and 4-9.  Pt's son lives in the backyard in a trailer and pt's daughter lives 5 minutes away.  Pt's daughter reported that pt would have sufficient care once back home.  Pt was CGA for bed mobility, CGA for sit to stand with standard walker and min assist for ambulation of 3 feet with standard walker.  Due to aforementioned function and strength deficits, pt is in need of skilled physical therapy.  It is recommended that pt is discharged to home with familial support, 24 hour assistance and home health PT when medically appropriate.      Follow Up Recommendations Home health PT;Supervision/Assistance - 24 hour    Equipment Recommendations  None recommended by PT    Recommendations for Other Services       Precautions / Restrictions Precautions Precautions: Fall Restrictions Weight Bearing Restrictions: No      Mobility  Bed Mobility Overal bed mobility: Needs Assistance Bed Mobility: Supine to Sit;Sit to Supine     Supine to sit: Min guard Sit to supine: Min guard       Transfers Overall transfer level: Needs assistance Equipment used: Standard walker Transfers: Sit to/from Stand Sit to Stand: Min guard         General transfer comment: increased time   Ambulation/Gait Ambulation/Gait assistance: Min guard Ambulation Distance (Feet): 3 Feet Assistive device: Standard walker Gait Pattern/deviations: Step-to pattern Gait velocity: decreased       Stairs            Wheelchair Mobility    Modified Rankin (Stroke Patients Only)       Balance Overall balance assessment: Needs assistance Sitting-balance support: Feet supported Sitting balance-Leahy Scale: Good     Standing balance support: Bilateral upper extremity supported (standard walker ) Standing balance-Leahy Scale: Fair                               Pertinent Vitals/Pain Pain Assessment: No/denies pain  See flow sheet for vitals.     Home Living Family/patient expects to be discharged to:: Private residence Living Arrangements: Alone Available Help at Discharge: Family Type of Home: House Home Access: Ramped entrance     Home Layout: Two level;Able to live on main level with bedroom/bathroom Home Equipment: Walker - 4 wheels;Shower seat;Bedside commode;Grab bars - tub/shower;Grab bars - toilet;Wheelchair - manual      Prior Function Level of Independence: Needs assistance      ADL's / Homemaking Assistance Needed: Patient has an aide who comes for approximatley 7 hours a day, 7 days a  week to assist with cleaning and self care tasks. Patient requires assist for bathing and dressing as well as homecare and meal preparation.   Comments: Mod indep with household mobility using rollator;     Hand Dominance   Dominant Hand: Right    Extremity/Trunk Assessment   Upper Extremity Assessment: Generalized weakness           Lower Extremity Assessment: Generalized weakness      Cervical / Trunk Assessment: Normal  Communication    Communication: No difficulties  Cognition Arousal/Alertness: Awake/alert Behavior During Therapy: WFL for tasks assessed/performed Overall Cognitive Status: Within Functional Limits for tasks assessed                      General Comments   Nursing was contacted and cleared pt for physical therapy.  Pt was agreeable and session was modified due to fatigue.  Pt pushed standard walker like a rolling walker during session.  Pt's daughter was present after session had ended.      Exercises        Assessment/Plan    PT Assessment Patient needs continued PT services  PT Diagnosis Generalized weakness   PT Problem List Decreased strength;Decreased activity tolerance;Decreased balance;Decreased safety awareness;Cardiopulmonary status limiting activity;Decreased mobility  PT Treatment Interventions DME instruction;Gait training;Therapeutic activities;Therapeutic exercise;Balance training;Patient/family education   PT Goals (Current goals can be found in the Care Plan section) Acute Rehab PT Goals Patient Stated Goal: to go home  PT Goal Formulation: With patient Time For Goal Achievement: 12/10/15 Potential to Achieve Goals: Good    Frequency Min 2X/week   Barriers to discharge        Co-evaluation               End of Session Equipment Utilized During Treatment: Gait belt;Oxygen (2 L/min O2 via nasal cannula ) Activity Tolerance: Patient limited by fatigue Patient left: in chair;with chair alarm set;with call bell/phone within reach Nurse Communication: Mobility status;Precautions         Time: [id number] PT Time Calculation (min) (ACUTE ONLY): 32 min   Charges:         PT G Codes:       Lyndel SafeGarrett Raya Mckinstry, SPT Lyndel SafeGarrett Apollonia Amini 11/26/2015, 1:33 PM

## 2015-11-26 NOTE — Care Management Obs Status (Signed)
MEDICARE OBSERVATION STATUS NOTIFICATION   Patient Details  Name: [name] MRN: [id number] Date of Birth: 07/21/1929   Medicare Observation Status Notification Given:  Yes    Collie Siadngela Mikiah Demond, RN 11/26/2015, 8:43 AM

## 2015-11-27 ENCOUNTER — Encounter: Payer: Self-pay | Admitting: Internal Medicine

## 2015-11-27 ENCOUNTER — Telehealth: Payer: Self-pay | Admitting: Internal Medicine

## 2015-11-27 ENCOUNTER — Telehealth: Payer: Self-pay | Admitting: *Deleted

## 2015-11-27 DIAGNOSIS — J441 Chronic obstructive pulmonary disease with (acute) exacerbation: Secondary | ICD-10-CM | POA: Diagnosis not present

## 2015-11-27 LAB — GLUCOSE, CAPILLARY
Glucose-Capillary: 204 mg/dL — ABNORMAL HIGH (ref 65–99)
Glucose-Capillary: 178 mg/dL — ABNORMAL HIGH (ref 65–99)

## 2015-11-27 MED ORDER — PREDNISONE 50 MG PO TABS
50.0000 mg | ORAL_TABLET | Freq: Every day | ORAL | Status: DC
  Administered 2015-11-27: 50 mg via ORAL
  Filled 2015-11-27: qty 1

## 2015-11-27 MED ORDER — PREDNISONE 50 MG PO TABS: ORAL_TABLET | ORAL | Status: DC

## 2015-11-27 MED ORDER — PREDNISONE 10 MG PO TABS: ORAL_TABLET | ORAL | Status: AC

## 2015-11-27 NOTE — Progress Notes (Addendum)
Discharge and Rx instructions given to patient. She states that she has no questions for nurse. O2 sats done and patient will go home with oxygen. Patient's son will come to pick her up later in afternoon. Nurse will have son sign d/c papers since her tremors make is difficult for her to sign.  [facility] has [facility], physical therapy and o/t  in place.

## 2015-11-27 NOTE — Telephone Encounter (Signed)
Thank you.  Will follow as appropriate. 

## 2015-11-27 NOTE — Progress Notes (Signed)
Inpatient Diabetes Program Recommendations  AACE/ADA: New Consensus Statement on Inpatient Glycemic Control (2015)  Target Ranges:  Prepandial:   less than 140 mg/dL      Peak postprandial:   less than 180 mg/dL (1-2 hours)      Critically ill patients:  140 - 180 mg/dL   Review of Glycemic Control  Results for [name], [name] (MRN [id number]) as of 11/27/2015 11:55  Ref. Range 11/26/2015 10:58 11/26/2015 16:27 11/26/2015 21:19 11/27/2015 08:19 11/27/2015 11:09  Glucose-Capillary Latest Ref Range: 65-99 mg/dL 409268 (H) 811298 (H) 914228 (H) 204 (H) 178 (H)   Diabetes history: Type 2 Outpatient Diabetes medications: Lantus 70 units qhs, Novolog 8-18 units tid with meals Current orders for Inpatient glycemic control: Lantus 70 units qhs, Novolog 0-9 units tid, Novolog 0-5 units qhs ** steroids q8h  Inpatient Diabetes Program Recommendations: Post prandial blood sugars remain elevated- consider increasing Novolog mealtime correction to 0-15 units - moderate scale  Susette RacerJulie Zakyia Gagan, RN, [place], [place]HA, CDE Diabetes Coordinator Inpatient Diabetes Program  [phone] (Team Pager) [phone] [facility] Office) 11/27/2015 11:56 AM

## 2015-11-27 NOTE — Progress Notes (Signed)
SATURATION QUALIFICATIONS: (This note is used to comply with regulatory documentation for home oxygen)  Patient Saturations on Room Air at Rest = 91%  Patient Saturations on Room Air while Ambulating = 77%  Patient Saturations on 2 Liters of oxygen while Ambulating = 93%  Please briefly explain why patient needs home oxygen: Patient quickly de-sats while ambulating without oxygen.

## 2015-11-27 NOTE — Telephone Encounter (Signed)
Bennetta Laosancy Wilson [phone] called from [facility] regarding pt being in the hospital she was unable to make her OT visit for this week. Harriett Sineancy said the visit she missed she will move it to the end of her frequency. Thank you!

## 2015-11-27 NOTE — Care Management (Signed)
Patient meets criteria for home O2. Order received from MD. Family had to leave due to peritoneal dialysis and will send another family member to pick her up by noon. I have requested home O2/tank delivery to this room before that time as family is in a hurry to take patient home. When O2 tank is delivered there will be no other RNCM needs.

## 2015-11-27 NOTE — Discharge Instructions (Signed)
Rehab at home

## 2015-11-27 NOTE — Telephone Encounter (Signed)
[facility], she was admitted for COPD, and has been scheduled on Dr. Sonny DandyWalkers schedule for 12/02/15 @11 :30.-Thanks

## 2015-11-27 NOTE — Care Management (Addendum)
I have notified Barbara CowerJason with [facility] that patient will be discharging home today. Received call from Georgian CoAngela Decoteau DIL stating that Ms. Petronio's son will arrange transportation. Advised that O2 tank would need to be brought to the hospital by son. DIL Marylene Landngela stated that patient is only on O2 at home during the night. Notified staff- O2 assessment needed. No further RNCM needs. Case closed.

## 2015-11-27 NOTE — Discharge Summary (Addendum)
[facility] - [place] at [facility]   PATIENT NAME: [name]    MR#:  [id number]  DATE OF BIRTH:  09/29/1928  DATE OF ADMISSION:  11/25/2015 ADMITTING PHYSICIAN: Alford Highland, MD  DATE OF DISCHARGE:11/27/15  PRIMARY CARE PHYSICIAN: Wynona Dove, MD    ADMISSION DIAGNOSIS:  Elevated troponin I level [R79.89] Chronic obstructive pulmonary disease, unspecified COPD type (HCC) [J44.9]  DISCHARGE DIAGNOSIS:  COPD flare gen weakenss  SECONDARY DIAGNOSIS:   Past Medical History  Diagnosis Date  . Anxiety   . Chronic diastolic CHF (congestive heart failure) (HCC)   . Depression   . Diabetes mellitus     Follow[facility] by [facility] Tedd Sias [facility]  . GERD (gastroesophageal reflux disease)   . Hypertension   . Hypothyroidism   . Osteoporosis   . Allergy   . Hyperlipidemia   . RLS (restless legs syndrome)   . Pancreatic disease     pancreatic failure  . Urinary incontinence   . Pancreatitis     Pancreatic resection/ open wound- [facility] 11/04  . Pneumonia     Pneumonia/ Emphysema 05/05  . 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 TIA (transient ischemic attack)     recurrent 12/15  . History of pleural empyema   . Sepsis (HCC)   . Frequent falls   . Arthritis     left knee, [facility]  . CAD (coronary artery disease), native coronary artery      1//15  T mid-LAD  stent OM2  . Parkinson's disease [facility])     HOSPITAL COURSE:   [name] is a 82 y.o. female patient recently sent out of the hospital with COPD exacerbation coming back with similar symptoms and worsening over the last few days. Patient was sent in by her PMD today to the ER for further evaluation. The patient states that she's been having shortness of breath coughing and wheezing going on for the past week  1. COPD exacerbation. Give Solu-Medrol IV and budesonide nebulizers along with albuterol nebulizers. Patient was recently on  antibiotics will stop that at this point. Chest x-ray negative. -change to po steroid taper 2. weakness will get physical therapy evaluation -recommends HHPT 3. Hypothyroidism unspecified continue levothyroxine 4. Diabetes type 2 uncomplicated. Sugars will likely be high on steroids 5. Hypertension essential. Continue usual medications 6. Arthritis 7. Elevated troponin only borderline. Likely demand ischemia from COPD exacerbation 8. Recent GI bleed but hemoglobin was stable hold off on Lovenox at this point. 9. Hypokalemia replace potassium 10. Leukocytosis likely secondary to steroids  Overall stable for d/c to home. Spoke with [name] llyod and updated Pt agreaable CONSULTS OBTAINED:     DRUG ALLERGIES:   Allergies  Allergen Reactions  . Citalopram Nausea And Vomiting and Other (See Comments)    Reaction:  Dizziness and dry mouth   . Diphenhydramine Other (See Comments)    Reaction:  Unknown   . Metoclopramide Other (See Comments)    Reaction:  Unknown   . Paroxetine Other (See Comments)    Reaction:  Hallucinations   . Tape Rash    DISCHARGE MEDICATIONS:   Current Discharge Medication List    CONTINUE these medications which have CHANGED   Details  predniSONE (DELTASONE) 10 MG tablet Start 50 mg daily taper by 10 mg daily then stop Qty: 15 tablet, Refills: 0      CONTINUE these medications which have NOT CHANGED  Details  albuterol (PROVENTIL HFA;VENTOLIN HFA) 108 (90 BASE) MCG/ACT inhaler Inhale 2 puffs into the lungs every 6 (six) hours as needed for wheezing or shortness of breath. Qty: 18 g, Refills: 3    ALPRAZolam (XANAX) 1 MG tablet Take 0.5-1 mg by mouth 3 (three) times daily. Pt takes one-half tablet in the morning, one-half tablet at lunch, and one tablet at bedtime.    amLODipine (NORVASC) 5 MG tablet Take 1 tablet (5 mg total) by mouth daily. Qty: 30 tablet, Refills: 5    budesonide (ENTOCORT EC) 3 MG 24 hr capsule Take 9 mg by mouth daily.      busPIRone (BUSPAR) 10 MG tablet Take 10 mg by mouth 2 (two) times daily.    carbidopa-levodopa (SINEMET IR) 25-100 MG tablet Take 1 tablet by mouth 3 (three) times daily.    cetirizine (ZYRTEC) 10 MG tablet Take 10 mg by mouth at bedtime as needed for allergies.     clopidogrel (PLAVIX) 75 MG tablet Take 75 mg by mouth daily.    cyanocobalamin (,VITAMIN B-12,) 1000 MCG/ML injection Inject 1 mL (1,000 mcg total) into the muscle every 30 (thirty) days. Qty: 30 mL, Refills: 3    furosemide (LASIX) 40 MG tablet Take 1 tablet (40 mg total) by mouth daily. Qty: 90 tablet, Refills: 3    HYDROcodone-acetaminophen (NORCO/VICODIN) 5-325 MG tablet Take 1-2 tablets by mouth 3 (three) times daily as needed for moderate pain. Qty: 120 tablet, Refills: 0   Associated Diagnoses: Osteoarthrosis, unspecified whether generalized or localized, involving lower leg    insulin glargine (LANTUS) 100 unit/mL SOPN Inject 70 Units into the skin at bedtime.    insulin lispro (HUMALOG) 100 UNIT/ML injection Inject 8-18 Units into the skin 3 (three) times daily with meals as needed for high blood sugar. Pt uses as needed per sliding scale.    levothyroxine (SYNTHROID, LEVOTHROID) 75 MCG tablet Take 75 mcg by mouth daily before breakfast.     Multiple Vitamin (MULTIVITAMIN WITH MINERALS) TABS tablet Take 1 tablet by mouth daily.    omeprazole (PRILOSEC) 20 MG capsule Take 1 capsule (20 mg total) by mouth daily. Qty: 90 capsule, Refills: 3    Pancrelipase, Lip-Prot-Amyl, (ZENPEP) 15000 units CPEP Take 15,000 Units by mouth 3 (three) times daily with meals.     pravastatin (PRAVACHOL) 40 MG tablet Take 40 mg by mouth at bedtime.     saccharomyces boulardii (FLORASTOR) 250 MG capsule Take 250 mg by mouth daily.       STOP taking these medications     azithromycin (ZITHROMAX) 250 MG tablet         If you experience worsening of your admission symptoms, develop shortness of breath, life threatening  emergency, suicidal or homicidal thoughts you must seek medical attention immediately by calling 911 or calling your MD immediately  if symptoms less severe.  You Must read complete instructions/literature along with all the possible adverse reactions/side effects for all the Medicines you take and that have been prescribed to you. Take any new Medicines after you have completely understood and accept all the possible adverse reactions/side effects.   Please note  You were cared for by a hospitalist during your hospital stay. If you have any questions about your discharge medications or the care you received while you were in the hospital after you are discharged, you can call the unit and asked to speak with the hospitalist on call if the hospitalist that took care of you is not available.  Once you are discharged, your primary care physician will handle any further medical issues. Please note that NO REFILLS for any discharge medications will be authorized once you are discharged, as it is imperative that you return to your primary care physician (or establish a relationship with a primary care physician if you do not have one) for your aftercare needs so that they can reassess your need for medications and monitor your lab values. Today   SUBJECTIVE   No complaints  VITAL SIGNS:  Blood pressure 169/86, pulse 96, temperature 98.1 F (36.7 C), temperature source Oral, resp. rate 18, height 4\' 11"  (1.499 m), weight 88.089 kg (194 lb 3.2 oz), SpO2 100 %.  I/O:    Intake/Output Summary (Last 24 hours) at 11/27/15 0852 Last data filed at 11/27/15 0831  Gross per 24 hour  Intake    240 ml  Output     27 ml  Net    213 ml    PHYSICAL EXAMINATION:  GENERAL:  82 y.o.-year-old patient lying in the bed with no acute distress.obese  EYES: Pupils equal, round, reactive to light and accommodation. No scleral icterus. Extraocular muscles intact.  HEENT: Head atraumatic, normocephalic. Oropharynx and  nasopharynx clear.  NECK:  Supple, no jugular venous distention. No thyroid enlargement, no tenderness.  LUNGS: Normal breath sounds bilaterally, no wheezing, rales,rhonchi or crepitation. No use of accessory muscles of respiration.  CARDIOVASCULAR: S1, S2 normal. No murmurs, rubs, or gallops.  ABDOMEN: Soft, non-tender, non-distended. Bowel sounds present. No organomegaly or mass.  EXTREMITIES: No pedal edema, cyanosis, or clubbing.  NEUROLOGIC: Cranial nerves II through XII are intact. Muscle strength 5/5 in all extremities. Sensation intact. Gait not checked.  PSYCHIATRIC: The patient is alert and oriented x 3.  SKIN: No obvious rash, lesion, or ulcer.   DATA REVIEW:   CBC   Recent Labs Lab 11/26/15 0547  WBC 12.9*  HGB 13.8  HCT 40.8  PLT 127*    Chemistries   Recent Labs Lab 11/26/15 0547  NA 140  K 3.8  CL 97*  CO2 34*  GLUCOSE 301*  BUN 29*  CREATININE 1.19*  CALCIUM 8.7*    Microbiology Results   No results found for this or any previous visit (from the past 240 hour(s)).  RADIOLOGY:  Dg Chest Port 1 View  11/25/2015  CLINICAL DATA:  Increasing shortness of breath and cough. EXAM: PORTABLE CHEST 1 VIEW COMPARISON:  11/18/2015 and 09/11/2015 FINDINGS: Heart size and pulmonary vascularity are normal and the lungs are clear. No acute bone abnormality. Chronic rotator cuff disease at the left shoulder. Old compression fractures at the thoracolumbar junction treated with vertebroplasty. Calcification in the thoracic aorta. IMPRESSION: No acute abnormality.  Aortic atherosclerosis. Electronically Signed   By: Francene Boyers M.D.   On: 11/25/2015 12:42     Management plans discussed with the patient, family and they are in agreement.  CODE STATUS:     Code Status Orders        Start     Ordered   11/25/15 1354  Full code   Continuous     11/25/15 1353    Code Status History    Date Active Date Inactive Code Status Order ID Comments User Context    11/18/2015  3:40 PM 11/21/2015  3:00 PM Full Code [id number]  Houston Siren, MD Inpatient   09/11/2015  2:29 PM 09/14/2015  6:52 PM Full Code [id number]  Milagros Loll, MD ED   03/12/2015  2:30 PM  03/14/2015  2:54 PM DNR [id number]  Shaune Pollack, MD Inpatient   02/12/2015  4:25 AM 02/13/2015  6:20 PM DNR [id number]  Oralia Manis, MD Inpatient      TOTAL TIME TAKING CARE OF THIS PATIENT: 40 minutes.    Margrete Delude M.D on 11/27/2015 at 8:52 AM  Between 7am to 6pm - Pager - [phone] After 6pm go to [email or web address] - password [email or web address] [facility]  [place] [facility]  Office  [phone]  CC: Primary care physician; Wynona Dove, MD

## 2015-11-27 NOTE — Progress Notes (Signed)
Telemetry discontinue

## 2015-11-27 NOTE — Telephone Encounter (Signed)
FYI

## 2015-11-30 ENCOUNTER — Other Ambulatory Visit: Payer: Self-pay | Admitting: *Deleted

## 2015-11-30 ENCOUNTER — Telehealth: Payer: Self-pay

## 2015-11-30 ENCOUNTER — Other Ambulatory Visit: Payer: Self-pay | Admitting: Internal Medicine

## 2015-11-30 MED ORDER — ALBUTEROL SULFATE (2.5 MG/3ML) 0.083% IN NEBU
2.5000 mg | INHALATION_SOLUTION | Freq: Four times a day (QID) | RESPIRATORY_TRACT | Status: AC | PRN
Start: 2015-11-30 — End: ?

## 2015-11-30 MED ORDER — GUAIFENESIN ER 600 MG PO TB12: 600.0000 mg | ORAL_TABLET | Freq: Two times a day (BID) | ORAL | Status: AC | PRN

## 2015-11-30 NOTE — Telephone Encounter (Signed)
Transition Care Management Follow-up Telephone Call  How have you been since you were released from the hospital? Cough is better. Which is great.    Do you understand why you were in the hospital? Bronchitis   Do you understand the discharge instrcutions? no  Items Reviewed:  Medications reviewed: No, called in some medications, she isn't sure of the names of them.   Allergies reviewed: No   Dietary changes reviewed: no changes  Referrals reviewed: None   Functional Questionnaire:   Activities of Daily Living (ADLs):   She states they are independent in the following: [facility] and her family are helping her with most of her ADL's States they require assistance with the following: Walker to get around the house   Any transportation issues/concerns?: no issues with upcoming appointment, she believes that her family will be bringing her.    Any patient concerns? Mucous, thick, colored clear like it is better though   Confirmed importance and date/time of follow-up visits scheduled: 12/02/2015 @ 1130   Confirmed with patient if condition begins to worsen call PCP or go to the ER.  Patient was given the Call-a-Nurse line [phone]:  She verbalized understanding.

## 2015-11-30 NOTE — Patient Outreach (Signed)
[facility] Care [name]  11/30/2015  [name] SpringClara O [name] 05/23/1929 [id number]  Transition of care  1443 RNCM placed call to patient as part of the transition of care program, no answer at patient home or mobile number unable to leave a message, I also placed a call to [name] SohoKenneth [name] listed on [facility] consent, no answer able to leave HIPPA compliant message with my return call number.  Plan  Await return call, if no response will contact other family contact, previously scheduled home visit for 4/11   [name] GaribaldiKimberly Chayanne Speir, RN, [name] [phone]- Mobile [phone]- Toll Free Main Office

## 2015-12-01 ENCOUNTER — Encounter: Payer: Self-pay | Admitting: *Deleted

## 2015-12-01 ENCOUNTER — Other Ambulatory Visit: Payer: Self-pay | Admitting: *Deleted

## 2015-12-01 NOTE — Patient Outreach (Signed)
[facility] Care Management   12/01/2015  Niana Magda KielO Ogando 05/24/1929 [id number]  Shawana Magda KielO Runco is an 82 y.o. female  Subjective: Patient report she continues to have a cough, denies increase sputum production, reports that she is not getting much sputum out.   Objective:   ROS  Physical Exam  Encounter Medications:   Outpatient Encounter Prescriptions as of 12/01/2015  Medication Sig  . albuterol (PROVENTIL HFA;VENTOLIN HFA) 108 (90 BASE) MCG/ACT inhaler Inhale 2 puffs into the lungs every 6 (six) hours as needed for wheezing or shortness of breath.  Marland Kitchen. albuterol (PROVENTIL) (2.5 MG/3ML) 0.083% nebulizer solution Take 3 mLs (2.5 mg total) by nebulization every 6 (six) hours as needed for wheezing or shortness of breath.  . ALPRAZolam (XANAX) 1 MG tablet Take 0.5-1 mg by mouth 3 (three) times daily. Pt takes one-half tablet in the morning, one-half tablet at lunch, and one tablet at bedtime.  Marland Kitchen. amLODipine (NORVASC) 5 MG tablet Take 1 tablet (5 mg total) by mouth daily.  . budesonide (ENTOCORT EC) 3 MG 24 hr capsule Take 9 mg by mouth daily.   . busPIRone (BUSPAR) 10 MG tablet Take 10 mg by mouth 2 (two) times daily.  . carbidopa-levodopa (SINEMET IR) 25-100 MG tablet Take 1 tablet by mouth 3 (three) times daily.  . cetirizine (ZYRTEC) 10 MG tablet Take 10 mg by mouth at bedtime as needed for allergies.   Marland Kitchen. clopidogrel (PLAVIX) 75 MG tablet Take 75 mg by mouth daily.  . cyanocobalamin (,VITAMIN B-12,) 1000 MCG/ML injection Inject 1 mL (1,000 mcg total) into the muscle every 30 (thirty) days.  . furosemide (LASIX) 40 MG tablet Take 1 tablet (40 mg total) by mouth daily.  Marland Kitchen. guaiFENesin (MUCINEX) 600 MG 12 hr tablet Take 1 tablet (600 mg total) by mouth 2 (two) times daily as needed.  Marland Kitchen. HYDROcodone-acetaminophen (NORCO/VICODIN) 5-325 MG tablet Take 1-2 tablets by mouth 3 (three) times daily as needed for moderate pain.  Marland Kitchen. insulin glargine (LANTUS) 100 unit/mL SOPN Inject 70  Units into the skin at bedtime.  . insulin lispro (HUMALOG) 100 UNIT/ML injection Inject 8-18 Units into the skin 3 (three) times daily with meals as needed for high blood sugar. Pt uses as needed per sliding scale.  . levothyroxine (SYNTHROID, LEVOTHROID) 75 MCG tablet Take 75 mcg by mouth daily before breakfast.   . Multiple Vitamin (MULTIVITAMIN WITH MINERALS) TABS tablet Take 1 tablet by mouth daily.  Marland Kitchen. omeprazole (PRILOSEC) 20 MG capsule Take 1 capsule (20 mg total) by mouth daily.  . Pancrelipase, Lip-Prot-Amyl, (ZENPEP) 15000 units CPEP Take 15,000 Units by mouth 3 (three) times daily with meals.   . pravastatin (PRAVACHOL) 40 MG tablet Take 40 mg by mouth at bedtime.   . predniSONE (DELTASONE) 10 MG tablet Start 50 mg daily taper by 10 mg daily then stop  . saccharomyces boulardii (FLORASTOR) 250 MG capsule Take 250 mg by mouth daily.    No facility-administered encounter medications on file as of 12/01/2015.    Functional Status:   In your present state of health, do you have any difficulty performing the following activities: 12/01/2015 11/25/2015  Hearing? - N  Vision? - N  Difficulty concentrating or making decisions? - N  Walking or climbing stairs? - Y  Dressing or bathing? - Y  Doing errands, shopping? - Y  Preparing Food and eating ? Y -  Using the Toilet? Y -  In the past six months, have you accidently leaked urine?  Y -  Do you have problems with loss of bowel control? Y -  Managing your Medications? Y -  Managing your Finances? Y -  Housekeeping or managing your Housekeeping? Y -    Fall/Depression Screening:    PHQ 2/9 Scores 12/01/2015 10/20/2015 10/23/2014 09/04/2012  PHQ - 2 Score 3 0 0 0  PHQ- 9 Score 8 - - -    Assessment:   Arrived to initial home visit, patient lying back in recliner chair, oxygen on 3 liters per nasal cannula. Home Health aide arriving at the same time .  Home Health Aide Nicholos Johns placed call to Mr.Dutt's daughter, I was able to speak with  Steward Drone to explain my role and [facility] community care coordinator and how it differs from home health.  [facility] RN,PT,OT and has a home health aide  in the am and pm. In the midst of completing assessment I received another phone call from Georgian Co, she stated that she was familiar with [facility] community care management services and appreciate all that we do , Mrs.Lloyds daughters, Mal Misty Care POA) and Mellody Dance son all agree that Mrs.Justo already have many people coming to her home and feel that she will become more confused with adding another service and requested that we not follow patient at present.  [name] has requested [facility] care management contact information to contact us for future needs, number provided also discussed with her she could also contact PCP when they are ready to have [facility] services again . Unable to complete entire  assessment at this visit.   Plan:  Will close case per family request, notify PCP and [facility] care management secretary.  Egbert Garibaldi, RN, [facility] Care Management [phone]- Mobile [phone]- Toll Free Main Office

## 2015-12-02 ENCOUNTER — Ambulatory Visit: Payer: Medicare Other | Admitting: Internal Medicine

## 2015-12-07 ENCOUNTER — Telehealth: Payer: Self-pay | Admitting: Internal Medicine

## 2015-12-07 NOTE — Telephone Encounter (Signed)
Mindy Gant [phone] called from [facility] pt has passed on 12/09/2015 Time of Death was 1:05pm.

## 2015-12-11 NOTE — Progress Notes (Signed)
This encounter was created in error - please disregard.

## 2015-12-14 ENCOUNTER — Ambulatory Visit: Payer: Self-pay | Admitting: Internal Medicine

## 2015-12-21 DEATH — deceased

## 2015-12-25 LAB — CUP PACEART REMOTE DEVICE CHECK: Date Time Interrogation Session: 20170219180542

## 2016-01-16 LAB — CUP PACEART REMOTE DEVICE CHECK: Date Time Interrogation Session: 20170321180806

## 2016-01-16 NOTE — Progress Notes (Signed)
Carelink summary report received. Battery status OK. Normal device function. No new symptom episodes, tachy episodes, brady, or pause episodes. 0.4% AF, no OAC 2/2 GI bleed.

## 2016-04-24 IMAGING — MR MR HEAD W/O CM
9 of 10 series · 40 of 48 positions shown · non-contrast
Comparison: 02/12/2015 head CT.  07/30/2014 brain MR.

CLINICAL DATA: 85-year-old diabetic hypertensive female post fall
last night hitting head on sofa. Slurred speech yesterday. No
reported loss of consciousness. Weakness left arm and leg.
Subsequent encounter.

EXAM:
MRI HEAD WITHOUT CONTRAST
TECHNIQUE: Multiplanar, multiecho pulse sequences of the brain and surrounding
structures were obtained without intravenous contrast.

[Series 2: T1 · sagittal · 5.0mm · 0.45mm/px · 3 of 28 slices shown]
[im 1/28]
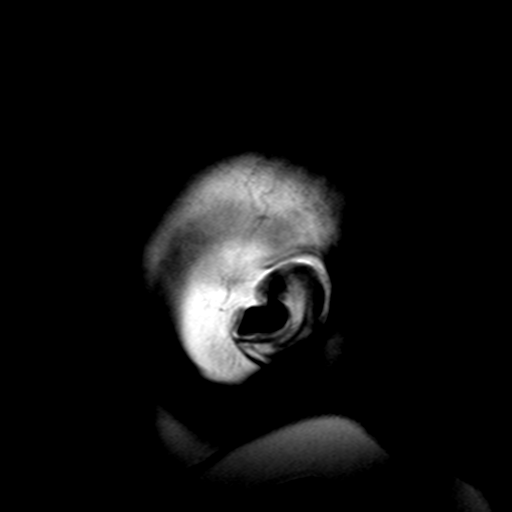
[im 14/28]
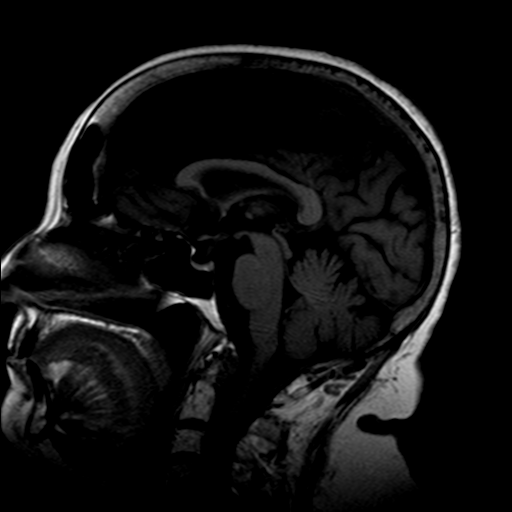
[im 28/28]
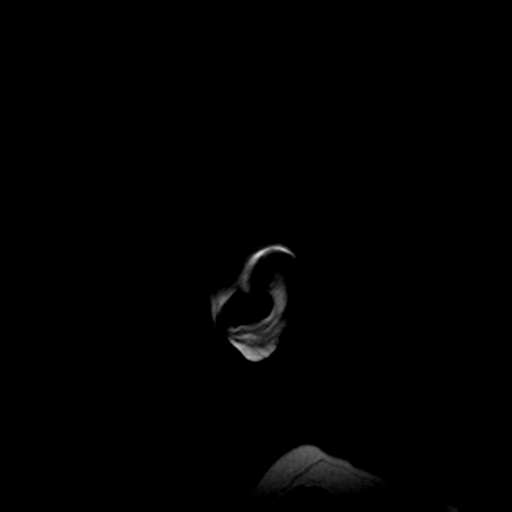

[Series 4: DWI · axial · 4.0mm · 0.94mm/px · z∈[-46,+118]mm · 5 of 43 slices shown (1 of 4)]
[im 1/43]
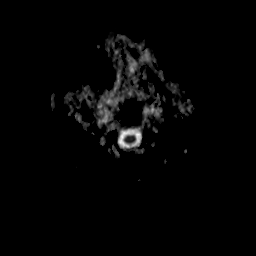
[im 11/43]
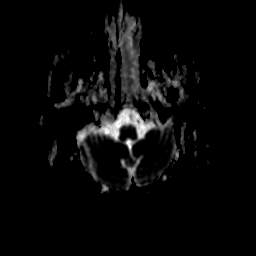
[im 22/43]
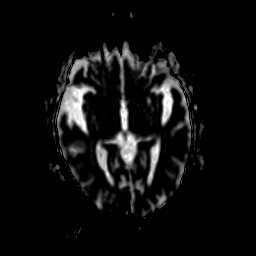
[im 32/43]
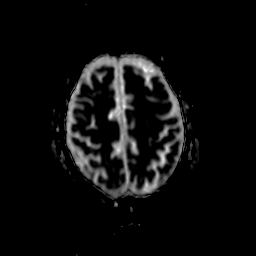
[im 43/43]
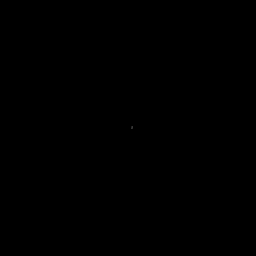

[Series 5: DWI · axial · 4.0mm · 0.94mm/px · z∈[-46,+114]mm · 6 of 42 slices shown (2 of 4)]
[im 1/42]
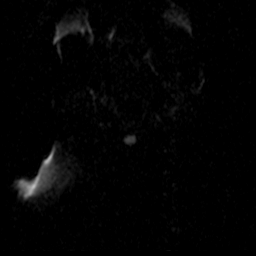
[im 9/42]
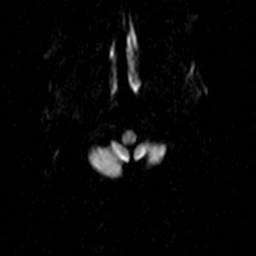
[im 17/42]
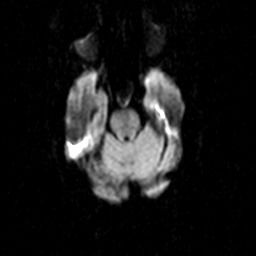
[im 25/42]
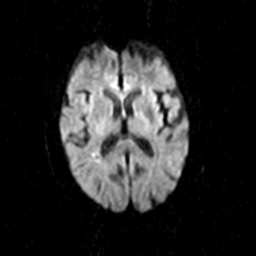
[im 33/42]
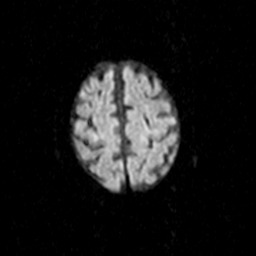
[im 42/42]
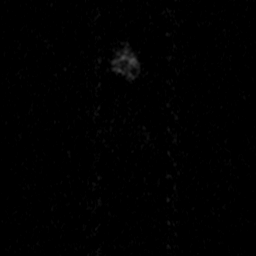

[Series 7: DWI · coronal · 5.0mm · 1.80mm/px · 5 of 37 slices shown (3 of 4)]
[im 1/37]
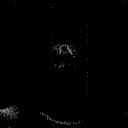
[im 10/37]
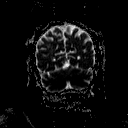
[im 19/37]
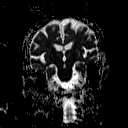
[im 28/37]
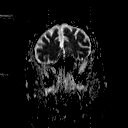
[im 37/37]
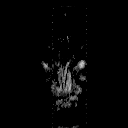

[Series 8: DWI · coronal · 5.0mm · 1.80mm/px · 5 of 37 slices shown (4 of 4)]
[im 1/37]
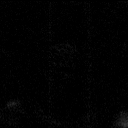
[im 10/37]
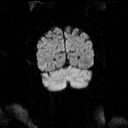
[im 19/37]
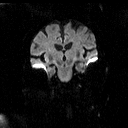
[im 28/37]
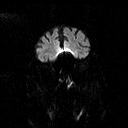
[im 37/37]
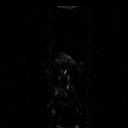

[Series 9: T2 · axial · 5.0mm · 0.45mm/px · z∈[-44,+115]mm · 4 of 26 slices shown (1 of 3)]
[im 1/26]
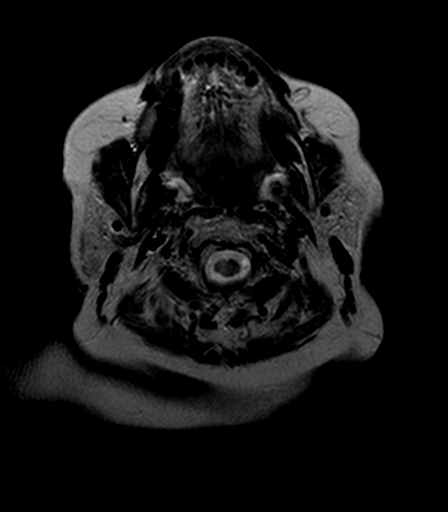
[im 9/26]
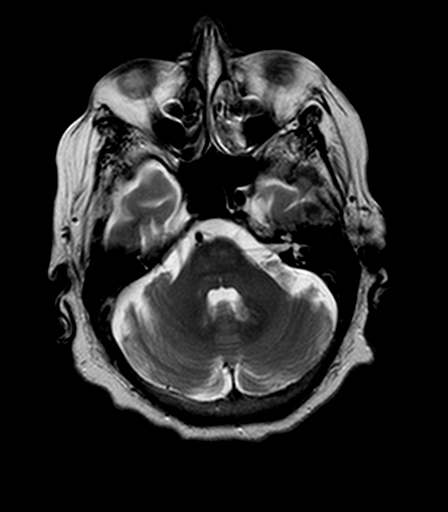
[im 17/26]
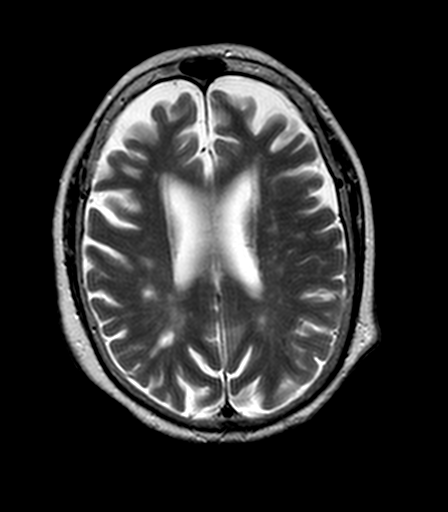
[im 26/26]
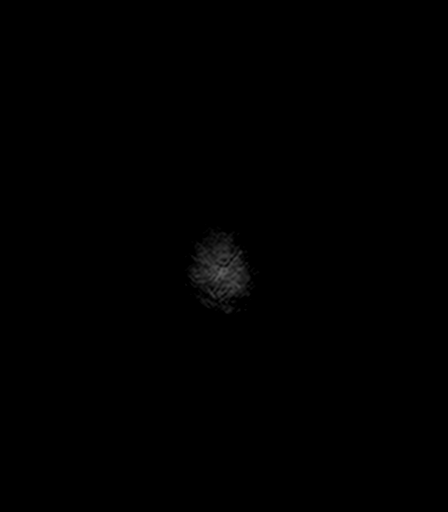

[Series 10: FLAIR · axial · 5.0mm · 0.90mm/px · z∈[-44,+115]mm · 4 of 26 slices shown]
[im 1/26]
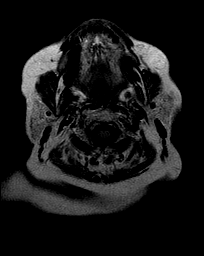
[im 9/26]
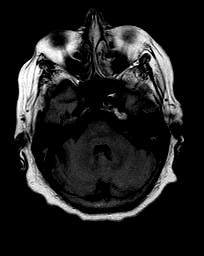
[im 17/26]
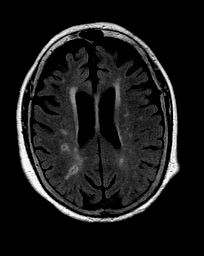
[im 26/26]
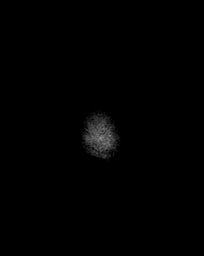

[Series 11: T2 · axial · 5.0mm · 0.45mm/px · z∈[-44,+115]mm · 4 of 26 slices shown (2 of 3)]
[im 1/26]
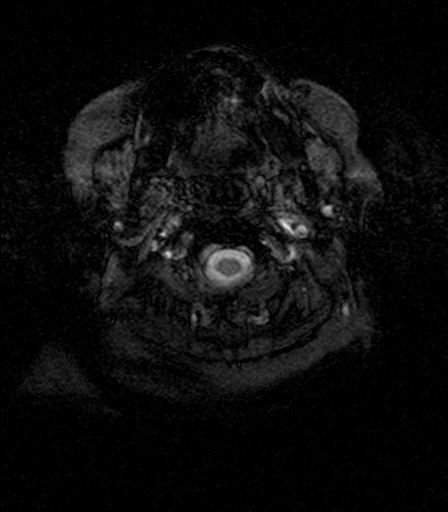
[im 9/26]
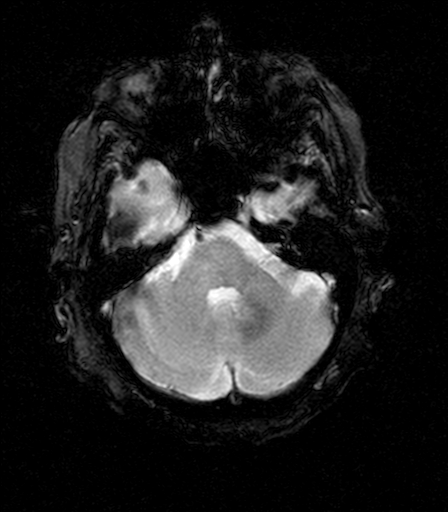
[im 17/26]
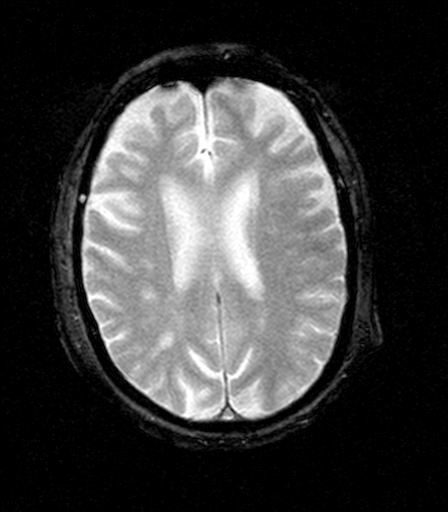
[im 26/26]
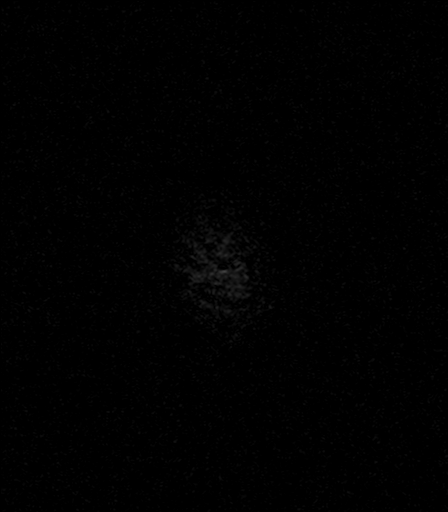

[Series 13: T2 · coronal · 5.0mm · 0.45mm/px · 4 of 30 slices shown (3 of 3)]
[im 1/30]
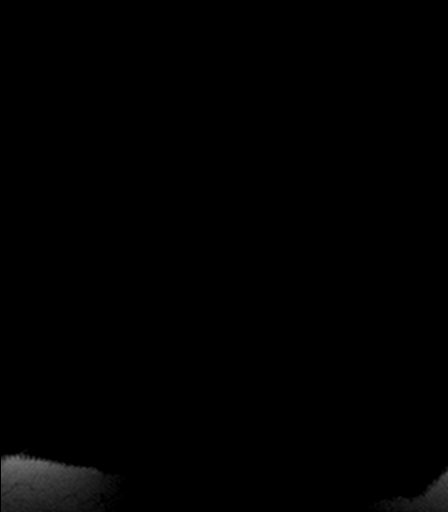
[im 10/30]
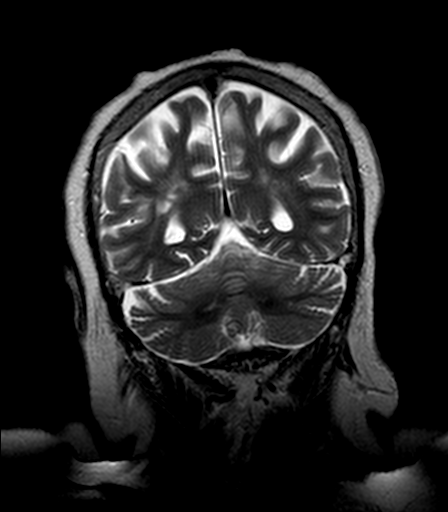
[im 20/30]
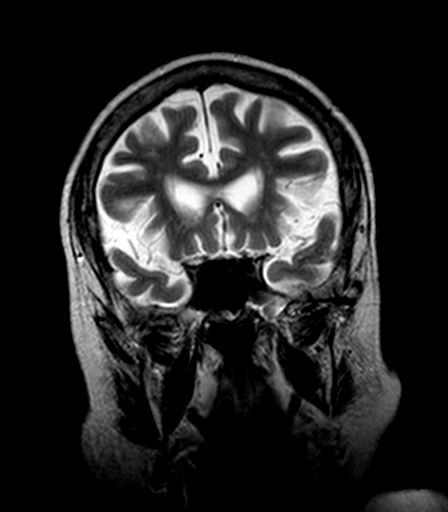
[im 30/30]
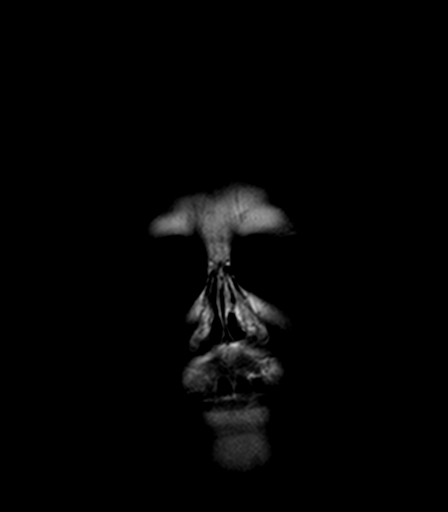

[40 of 48 positions shown; findings below may reference images not displayed]

FINDINGS: No acute infarct.

Remote infarcts right periatrial/ right parietal region. Remote tiny
cerebellar infarcts. Remote tiny right thalamic and basal ganglia
infarcts.

Mild small vessel disease type changes.

No intracranial hemorrhage.

Global atrophy without hydrocephalus.

No intracranial mass lesion noted on this unenhanced exam.

Major intracranial vascular structures are patent. Ectatic vertebral
arteries and basilar artery with atherosclerotic type changes of the
vertebral arteries suspected.

Transverse ligament hypertrophy. Cervical medullary junction,
pituitary region and pineal region unremarkable. Post lens
replacement otherwise orbital structures unremarkable.
IMPRESSION: No acute infarct.

Remote infarcts right periatrial/ right parietal region. Remote tiny
cerebellar infarcts. Remote tiny right thalamic and basal ganglia
infarcts.

Mild small vessel disease type changes.

No intracranial hemorrhage.

Global atrophy.

Ectatic vertebral arteries and basilar artery with atherosclerotic
type changes of the vertebral arteries suspected.

## 2016-07-27 IMAGING — CR DG HIP (WITH OR WITHOUT PELVIS) 2-3V*L*
3 series · 3 of 3 positions shown · non-contrast
Comparison: No priors.

CLINICAL DATA: 85-year-old female with left groin pain yesterday
while reaching for something. Pain in left groin while ambulating
today.

EXAM:
DG HIP (WITH OR WITHOUT PELVIS) 2-3V LEFT

[pelvis ap]
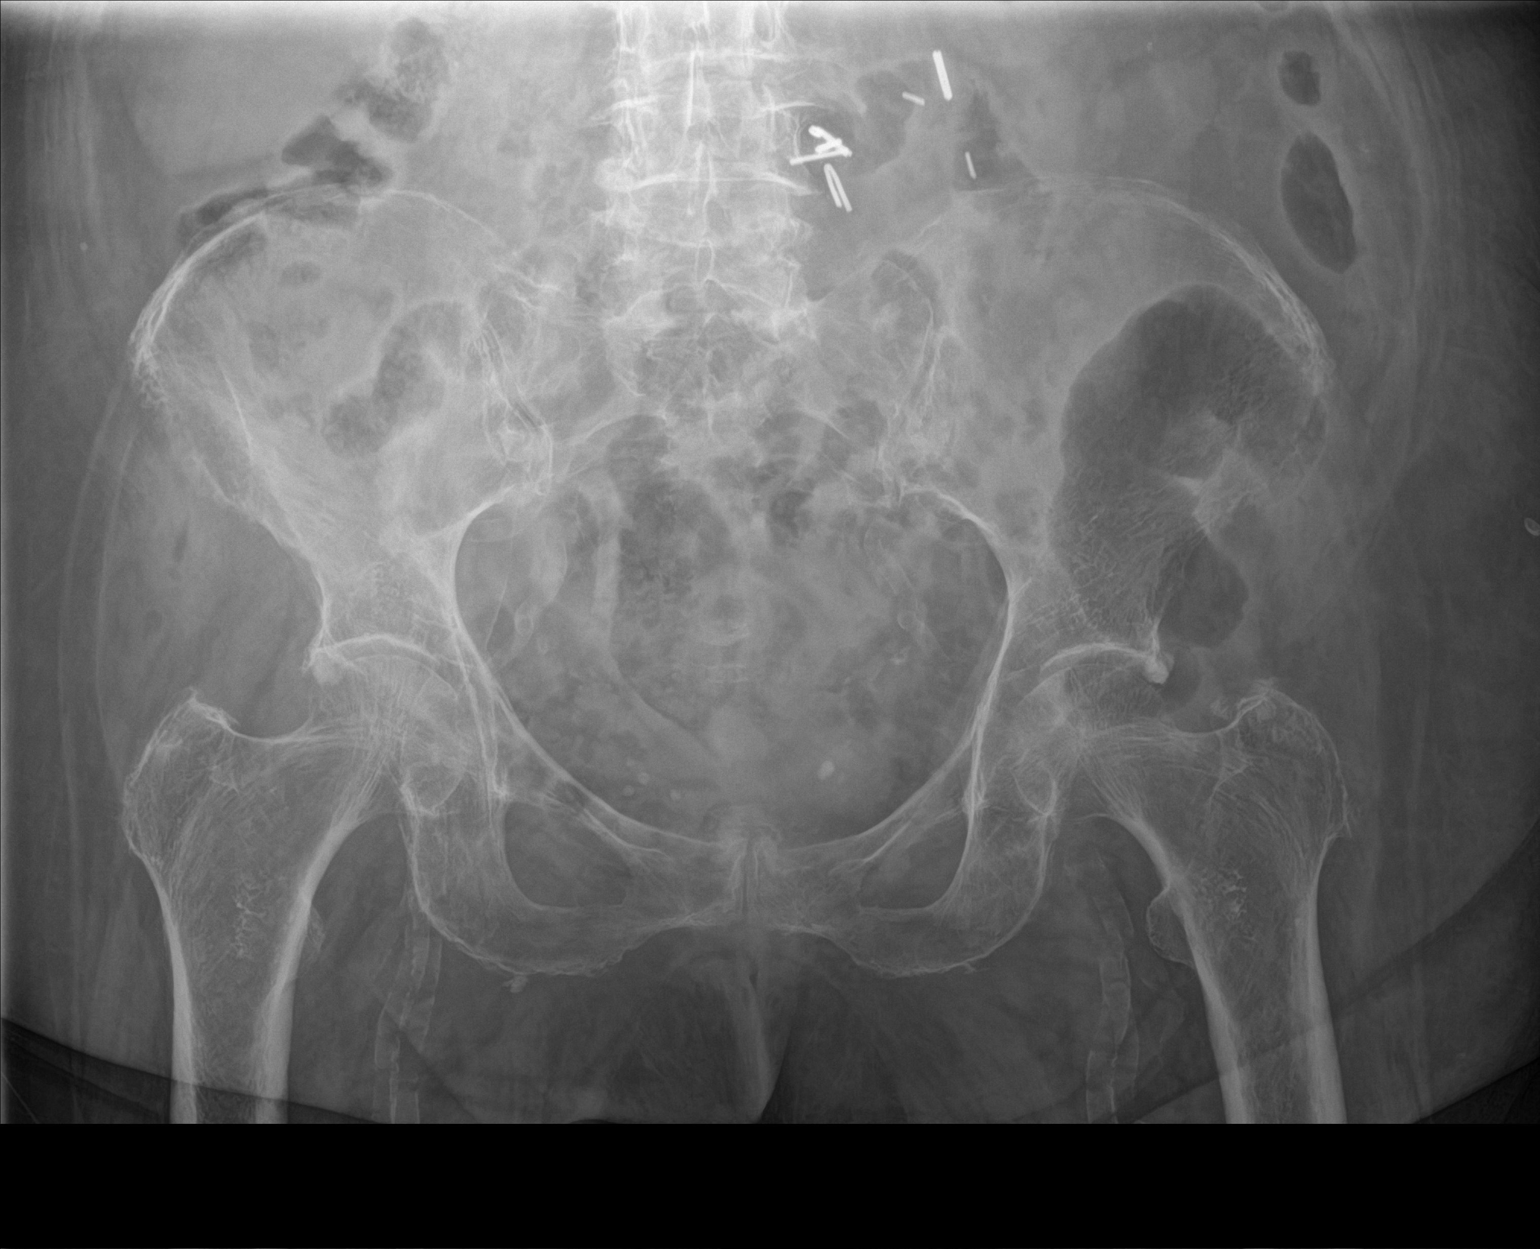

[hip ap]
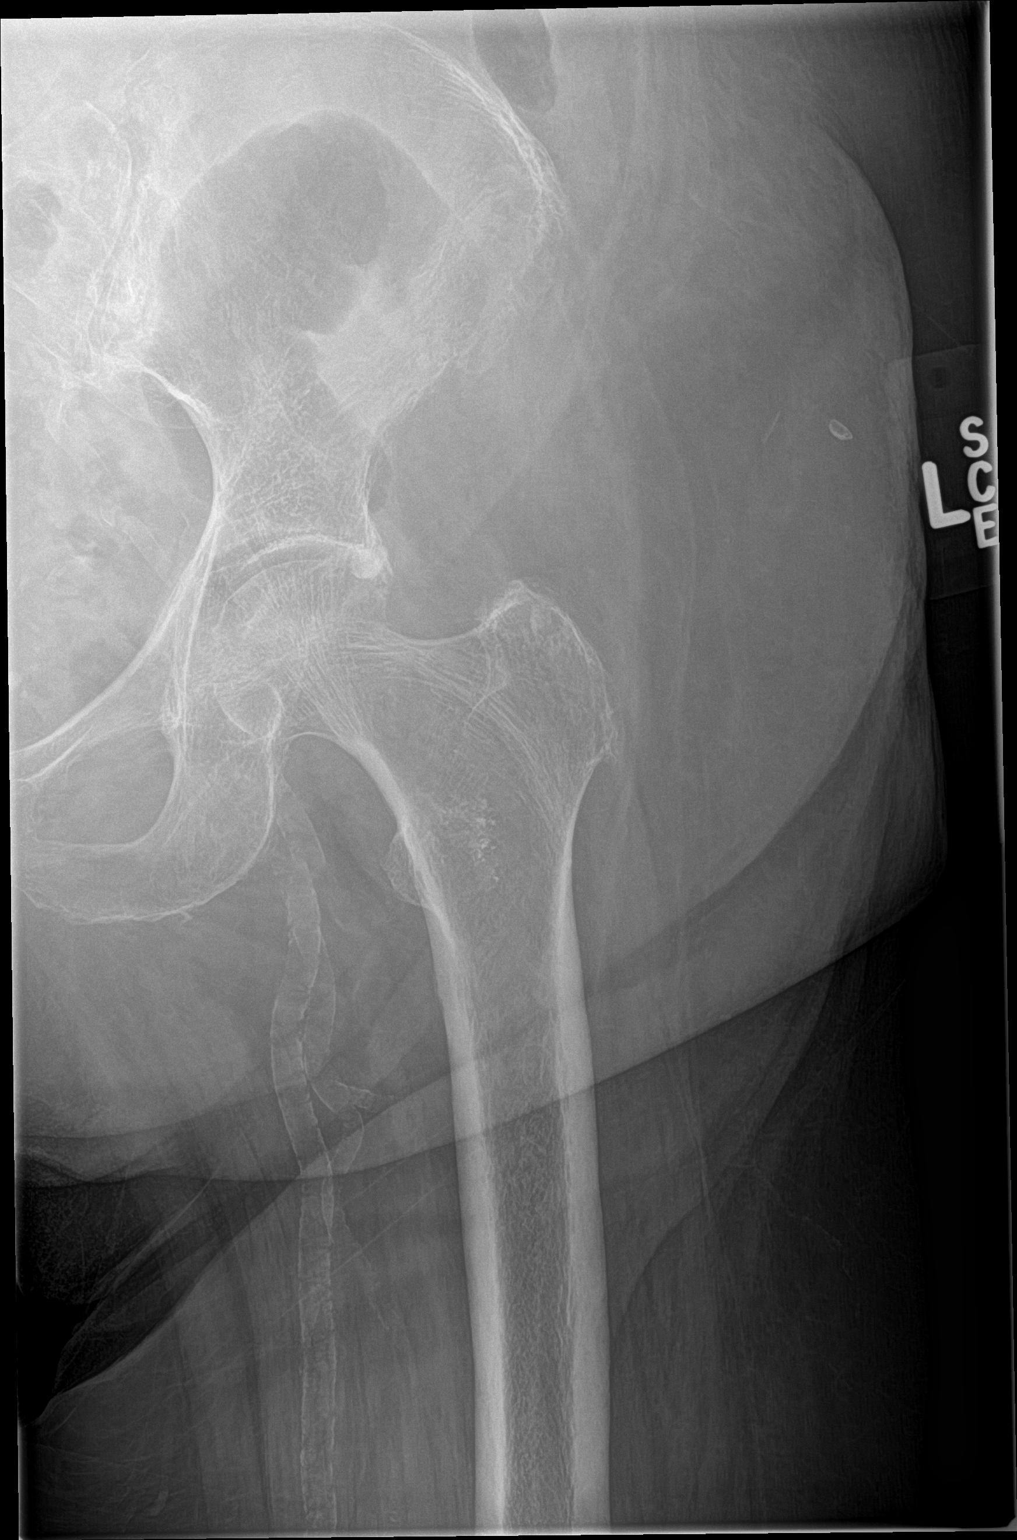

[hip lat]
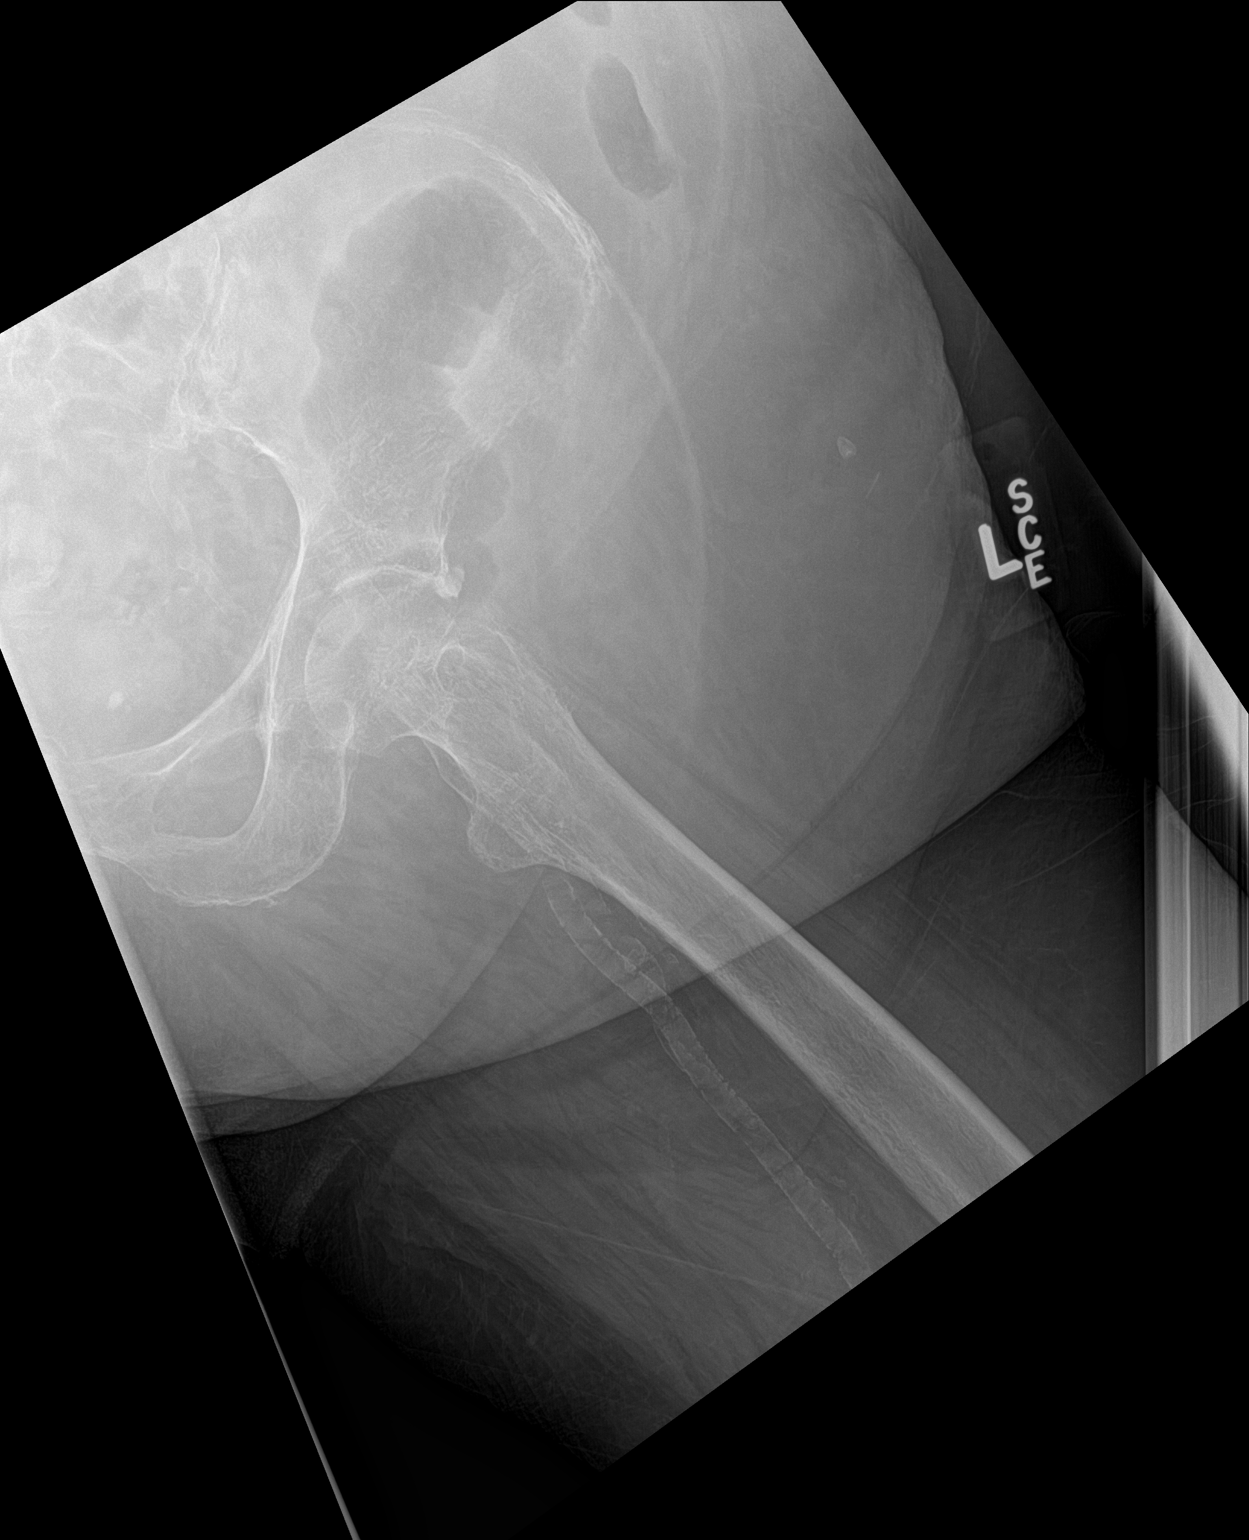

[3 of 3 positions shown; findings below may reference images not displayed]

FINDINGS: Bones are osteopenic. Bony pelvic ring appears intact. Visualized
portions of the proximal femurs bilaterally appear intact. Left
femoral head is located. Mild to moderate joint space narrowing,
subchondral sclerosis and osteophyte formation in the hip joints
bilaterally, compatible with osteoarthritis. Extensive vascular
calcifications. Numerous surgical clips adjacent to the lower left
lumbar spine.
IMPRESSION: 1. No acute radiographic abnormality of the bony pelvis or the left
hip.
2. Mild-to-moderate bilateral hip joint osteoarthritis.
3. Osteopenia.
4. Atherosclerosis.

## 2017-02-04 IMAGING — DX DG CHEST 1V PORT
1 series · 1 of 1 positions shown · non-contrast
Comparison: 11/18/2015 and 09/11/2015

CLINICAL DATA: Increasing shortness of breath and cough.

EXAM:
PORTABLE CHEST 1 VIEW

[chest ap]
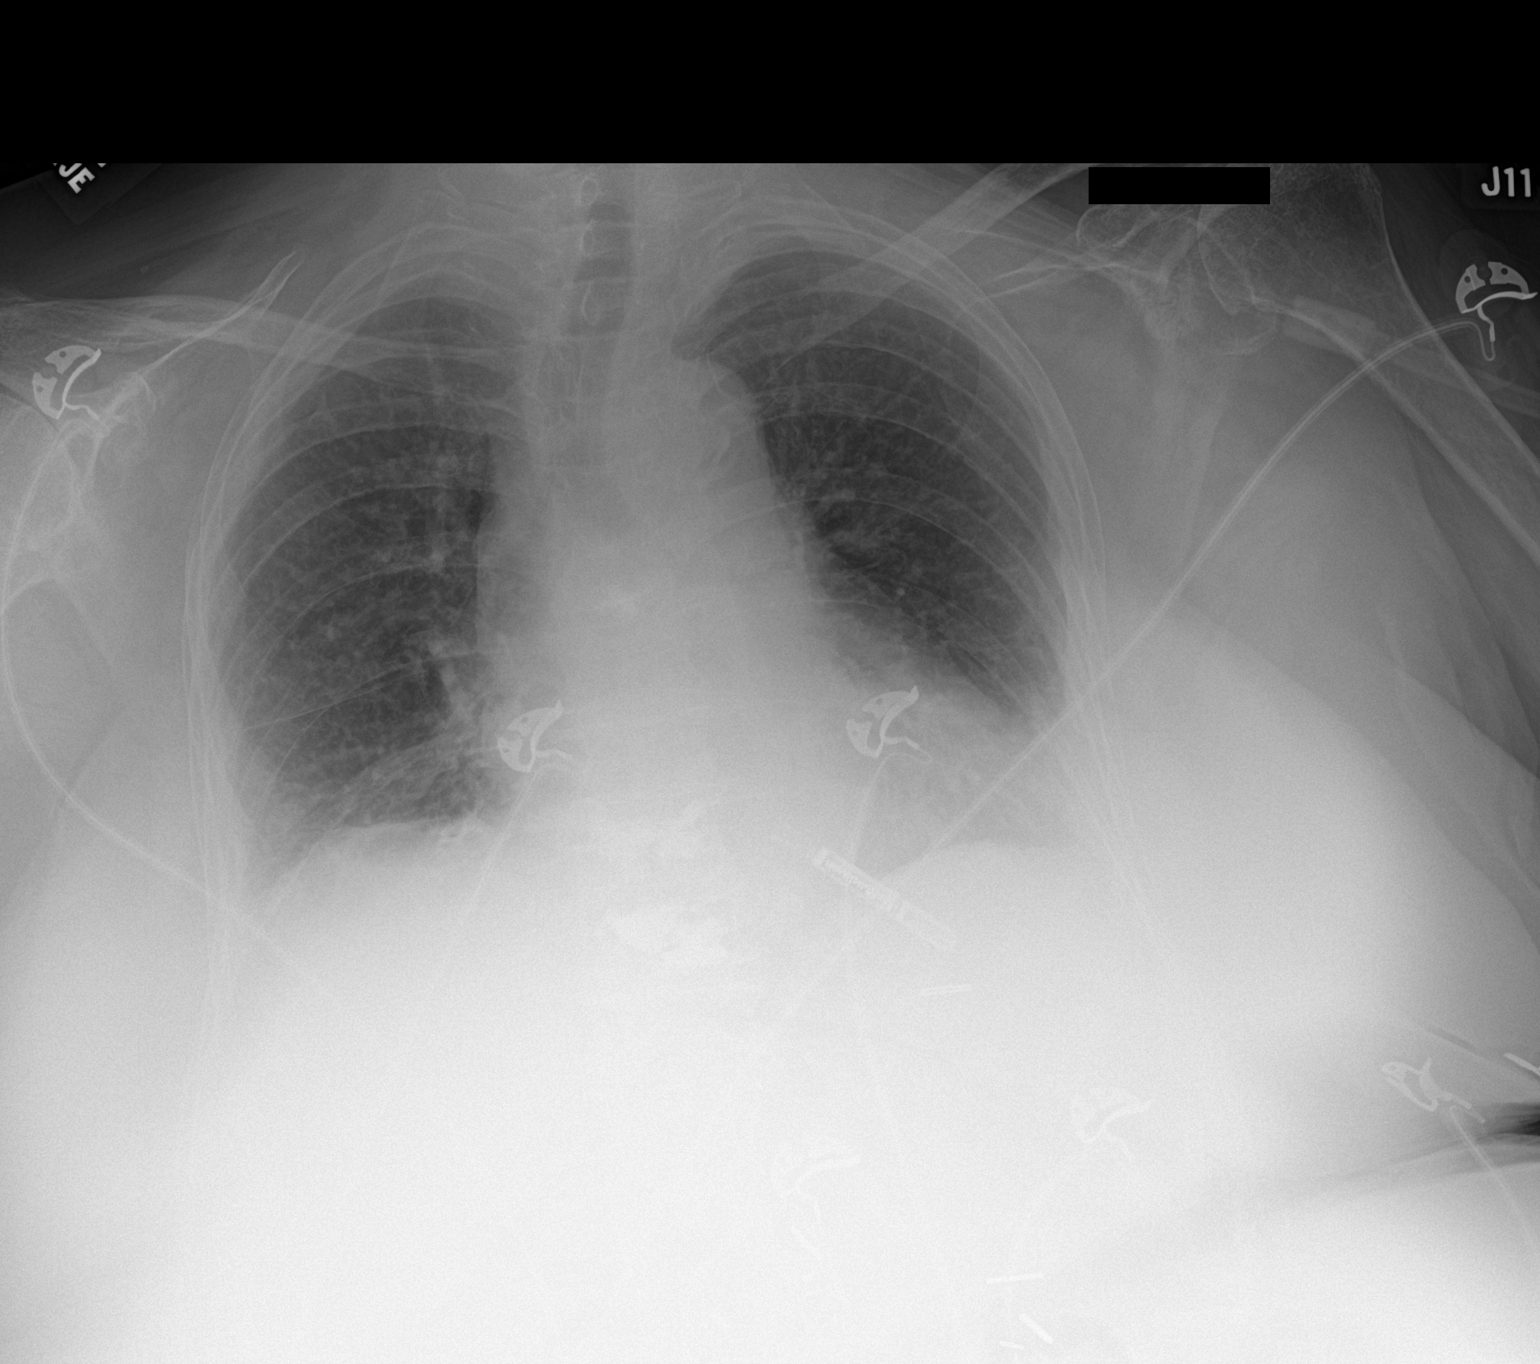

[1 of 1 positions shown; findings below may reference images not displayed]

FINDINGS: Heart size and pulmonary vascularity are normal and the lungs are
clear. No acute bone abnormality. Chronic rotator cuff disease at
the left shoulder. Old compression fractures at the thoracolumbar
junction treated with vertebroplasty.

Calcification in the thoracic aorta.
IMPRESSION: No acute abnormality.  Aortic atherosclerosis.

## 2018-12-18 ENCOUNTER — Other Ambulatory Visit: Payer: Self-pay | Admitting: *Deleted
